# Patient Record
Sex: Male | Born: 1940 | Race: White | Hispanic: No | Smoking: Never smoker
Health system: Southern US, Community
[De-identification: ages and names within clinical notes are randomized; demographics above are authoritative.]

## PROBLEM LIST (undated history)

## (undated) DIAGNOSIS — H409 Unspecified glaucoma: Secondary | ICD-10-CM

## (undated) DIAGNOSIS — E785 Hyperlipidemia, unspecified: Secondary | ICD-10-CM

## (undated) DIAGNOSIS — E119 Type 2 diabetes mellitus without complications: Secondary | ICD-10-CM

## (undated) DIAGNOSIS — G629 Polyneuropathy, unspecified: Secondary | ICD-10-CM

## (undated) DIAGNOSIS — I2510 Atherosclerotic heart disease of native coronary artery without angina pectoris: Secondary | ICD-10-CM

## (undated) DIAGNOSIS — I10 Essential (primary) hypertension: Secondary | ICD-10-CM

## (undated) DIAGNOSIS — M1990 Unspecified osteoarthritis, unspecified site: Secondary | ICD-10-CM

## (undated) DIAGNOSIS — N182 Chronic kidney disease, stage 2 (mild): Secondary | ICD-10-CM

## (undated) DIAGNOSIS — K219 Gastro-esophageal reflux disease without esophagitis: Secondary | ICD-10-CM

## (undated) DIAGNOSIS — M549 Dorsalgia, unspecified: Secondary | ICD-10-CM

## (undated) DIAGNOSIS — K635 Polyp of colon: Secondary | ICD-10-CM

## (undated) HISTORY — DX: Unspecified glaucoma: H40.9

## (undated) HISTORY — DX: Type 2 diabetes mellitus without complications: E11.9

## (undated) HISTORY — PX: KNEE SURGERY: SHX244

## (undated) HISTORY — DX: Atherosclerotic heart disease of native coronary artery without angina pectoris: I25.10

## (undated) HISTORY — DX: Polyneuropathy, unspecified: G62.9

## (undated) HISTORY — DX: Polyp of colon: K63.5

## (undated) HISTORY — DX: Dorsalgia, unspecified: M54.9

## (undated) HISTORY — DX: Unspecified osteoarthritis, unspecified site: M19.90

## (undated) HISTORY — DX: Essential (primary) hypertension: I10

## (undated) HISTORY — DX: Chronic kidney disease, stage 2 (mild): N18.2

## (undated) HISTORY — PX: BACK SURGERY: SHX140

## (undated) HISTORY — DX: Hyperlipidemia, unspecified: E78.5

## (undated) HISTORY — DX: Gastro-esophageal reflux disease without esophagitis: K21.9

---

## 2000-07-22 ENCOUNTER — Encounter: Admission: RE | Admit: 2000-07-22 | Discharge: 2000-10-20 | Payer: Self-pay | Admitting: Anesthesiology

## 2002-07-28 HISTORY — PX: CORONARY ARTERY BYPASS GRAFT: SHX141

## 2002-11-03 ENCOUNTER — Encounter: Payer: Self-pay | Admitting: Emergency Medicine

## 2002-11-04 ENCOUNTER — Inpatient Hospital Stay (HOSPITAL_COMMUNITY): Admission: AD | Admit: 2002-11-04 | Discharge: 2002-11-14 | Payer: Self-pay | Admitting: Emergency Medicine

## 2002-11-07 ENCOUNTER — Encounter: Payer: Self-pay | Admitting: Cardiovascular Disease

## 2002-11-08 ENCOUNTER — Encounter: Payer: Self-pay | Admitting: Surgery

## 2002-11-09 ENCOUNTER — Encounter: Payer: Self-pay | Admitting: Surgery

## 2002-11-10 ENCOUNTER — Encounter: Payer: Self-pay | Admitting: Surgery

## 2002-11-11 ENCOUNTER — Encounter: Payer: Self-pay | Admitting: Surgery

## 2002-12-20 ENCOUNTER — Encounter: Admission: RE | Admit: 2002-12-20 | Discharge: 2002-12-20 | Payer: Self-pay | Admitting: Surgery

## 2002-12-20 ENCOUNTER — Encounter: Payer: Self-pay | Admitting: Surgery

## 2003-11-13 ENCOUNTER — Encounter: Payer: Self-pay | Admitting: Cardiovascular Disease

## 2004-08-22 ENCOUNTER — Ambulatory Visit: Payer: Self-pay | Admitting: Internal Medicine

## 2006-05-25 ENCOUNTER — Encounter: Admission: RE | Admit: 2006-05-25 | Discharge: 2006-05-25 | Payer: Self-pay | Admitting: *Deleted

## 2006-05-27 ENCOUNTER — Encounter: Payer: Self-pay | Admitting: Cardiovascular Disease

## 2006-05-27 ENCOUNTER — Inpatient Hospital Stay (HOSPITAL_COMMUNITY): Admission: EM | Admit: 2006-05-27 | Discharge: 2006-05-28 | Payer: Self-pay | Admitting: Emergency Medicine

## 2008-01-18 ENCOUNTER — Encounter: Payer: Self-pay | Admitting: Cardiovascular Disease

## 2008-02-28 ENCOUNTER — Encounter: Payer: Self-pay | Admitting: Cardiovascular Disease

## 2008-02-28 LAB — CONVERTED CEMR LAB
AST: 9 units/L
Albumin: 4.2 g/dL
HDL: 35 mg/dL
Hgb A1c MFr Bld: 7.3 %
Total Protein: 6.4 g/dL
Triglyceride fasting, serum: 136 mg/dL

## 2008-08-01 ENCOUNTER — Encounter: Payer: Self-pay | Admitting: Cardiovascular Disease

## 2009-02-13 ENCOUNTER — Encounter: Payer: Self-pay | Admitting: Cardiovascular Disease

## 2009-02-21 ENCOUNTER — Encounter: Payer: Self-pay | Admitting: Cardiovascular Disease

## 2009-11-07 ENCOUNTER — Encounter: Payer: Self-pay | Admitting: Cardiovascular Disease

## 2009-11-07 DIAGNOSIS — E1169 Type 2 diabetes mellitus with other specified complication: Secondary | ICD-10-CM | POA: Insufficient documentation

## 2009-11-07 DIAGNOSIS — E782 Mixed hyperlipidemia: Secondary | ICD-10-CM

## 2009-11-07 DIAGNOSIS — E1140 Type 2 diabetes mellitus with diabetic neuropathy, unspecified: Secondary | ICD-10-CM | POA: Insufficient documentation

## 2009-11-07 DIAGNOSIS — M549 Dorsalgia, unspecified: Secondary | ICD-10-CM | POA: Insufficient documentation

## 2009-11-07 DIAGNOSIS — E118 Type 2 diabetes mellitus with unspecified complications: Secondary | ICD-10-CM

## 2009-11-07 DIAGNOSIS — E1159 Type 2 diabetes mellitus with other circulatory complications: Secondary | ICD-10-CM | POA: Insufficient documentation

## 2009-11-07 DIAGNOSIS — K219 Gastro-esophageal reflux disease without esophagitis: Secondary | ICD-10-CM

## 2009-11-07 DIAGNOSIS — I25810 Atherosclerosis of coronary artery bypass graft(s) without angina pectoris: Secondary | ICD-10-CM

## 2009-11-07 DIAGNOSIS — I10 Essential (primary) hypertension: Secondary | ICD-10-CM | POA: Insufficient documentation

## 2009-11-07 DIAGNOSIS — M109 Gout, unspecified: Secondary | ICD-10-CM

## 2009-11-07 DIAGNOSIS — I152 Hypertension secondary to endocrine disorders: Secondary | ICD-10-CM | POA: Insufficient documentation

## 2009-11-07 DIAGNOSIS — I200 Unstable angina: Secondary | ICD-10-CM | POA: Insufficient documentation

## 2010-02-25 ENCOUNTER — Encounter: Payer: Self-pay | Admitting: Cardiovascular Disease

## 2010-03-19 ENCOUNTER — Ambulatory Visit: Payer: BC Managed Care – PPO | Admitting: Emergency Medicine

## 2010-05-24 ENCOUNTER — Ambulatory Visit: Payer: Self-pay | Admitting: Cardiovascular Disease

## 2010-08-29 NOTE — Cardiovascular Report (Signed)
Summary: [facility]  [facility]   Imported By: Marylou Mccoy 07/05/2010 12:04:44  _____________________________________________________________________  External Attachment:    Type:   Image     Comment:   External Document

## 2010-08-29 NOTE — Assessment & Plan Note (Signed)
Summary: NP6/AMD   Visit Type:  Initial Consult Primary Provider:  Dr. Lacie Scotts  CC:  Has a pain that comes across shoulder to his chest..  History of Present Illness: [name] is a very pleasant 70 year old gentleman with coronary artery disease, bypass surgery in 2004, occluded graft to the diagonal branch with 3 other patent grafts to the RCA, OM with a LIMA to the LAD who presents for routine followup. He was last seen at [facility] by myself in July 2010.  He reports that he has been doing very well with no significant chest pain with exertion. He does have occasional left shoulder pain at rest that starts in the shoulder and radiates down into the pectoral region. He is otherwise very active and just got back from the beach and has no complaints. He denies any lightheadedness or dizziness. He does have significant back problems and has had hydrocortisone shots. This dates back to a previous work injury.  We do not have his most recent blood work though cholesterol in 2009 was at goal with an LDL of 53 total cholesterol 115, HDL of 35. Hemoglobin A1c was 7.3  EKG shows normal sinus rhythm with rate of 56 beats per minute, nonspecific ST-T wave changes in the lateral precordial leads, significant artifact was noted  Allergies (verified): 1)  ! * Altace  Past History:  Past Medical History: Last updated: 11/07/2009 Unstable angina, resolved.      a.     Negative myocardial infarction. Positive nuclear study prior to admission. Coronary disease with a history of bypass grafting.      a.     New closure of saphenous vein graft to the diagonal, 100% of the       ostium; attempted angioplasty was not successful. Diabetes mellitus type 2, stable. History of back pain. Hyperlipidemia. Hypertension. Gastroesophageal reflux disease. Gout.  Past Surgical History: Last updated: 11/07/2009 CABG 2004  Review of Systems  The patient denies fever, weight  loss, weight gain, vision loss, decreased hearing, hoarseness, chest pain, syncope, dyspnea on exertion, peripheral edema, prolonged cough, abdominal pain, incontinence, muscle weakness, depression, and enlarged lymph nodes.         left shoulder pain  Vital Signs:  Patient profile:   70 year old male Height:      70 inches Weight:      190 pounds BMI:     27.36 Pulse rate:   57 / minute BP sitting:   154 / 74  (left arm) Cuff size:   regular  Vitals Entered By: Bishop Dublin, CMA (May 24, 2010 10:12 AM)  Physical Exam  General:  Well developed, well nourished, in no acute distress. Head:  normocephalic and atraumatic Neck:  Neck supple, no JVD. No masses, thyromegaly or abnormal cervical nodes. Lungs:  Clear bilaterally to auscultation and percussion. Heart:  Non-displaced PMI, chest non-tender; regular rate and rhythm, S1, S2 without murmurs, rubs or gallops. Carotid upstroke normal, no bruit. Normal abdominal aortic size, no bruits. Pedals normal pulses. No edema, no varicosities. Abdomen:  Bowel sounds positive; abdomen soft and non-tender without masses Msk:  Back normal, normal gait. Muscle strength and tone normal. Pulses:  pulses normal in all 4 extremities Extremities:  No clubbing or cyanosis. Neurologic:  Alert and oriented x 3. Skin:  Intact without lesions or rashes. Psych:  Normal affect.   Impression & Recommendations:  Problem # 1:  CORONARY ATHEROSLERO AUTOL VEIN BYPASS GRAFT (ICD-414.02) history of severe coronary  artery disease. No symptoms at this time. Negative stress test last year . No further testing at this time We'll continue to agree him aggressively.  His updated medication list for this problem includes:    Aspirin 81 Mg Tbec (Aspirin) .Marland Kitchen... Take one tablet by mouth daily    Plavix 75 Mg Tabs (Clopidogrel bisulfate) .Marland Kitchen... 1 tab daily    Coreg 6.25 Mg Tabs (Carvedilol) .Marland Kitchen... 1 tab two times a day    Isosorbide Mononitrate Cr 30 Mg Xr24h-tab  (Isosorbide mononitrate) .Marland Kitchen... 1 tab daily    Ranexa 500 Mg Xr12h-tab (Ranolazine) .Marland Kitchen... 1 tab daily  Problem # 2:  HYPERTENSION (ICD-401.9) Blood pressure on recheck was 120/60. No changes made.  His updated medication list for this problem includes:    Aspirin 81 Mg Tbec (Aspirin) .Marland Kitchen... Take one tablet by mouth daily    Caduet 5-10 Mg Tabs (Amlodipine-atorvastatin) .Marland Kitchen... 1 tab daily    Coreg 6.25 Mg Tabs (Carvedilol) .Marland Kitchen... 1 tab two times a day  Problem # 3:  HYPERLIPIDEMIA (ICD-272.4) We'll try obtain his most recent lipid panel. Previous lipid panel from 2 years ago was at goal.  His updated medication list for this problem includes:    Caduet 5-10 Mg Tabs (Amlodipine-atorvastatin) .Marland Kitchen... 1 tab daily  Problem # 4:  DIABETES MELLITUS, TYPE II (ICD-250.00) We have encouraged him to continue to watch his diet. We will try to obtain the most recent updated medication list for our records.  His updated medication list for this problem includes:    Metformin Hcl 500 Mg Tabs (Metformin hcl) .Marland Kitchen... 1 tab two times a day    Aspirin 81 Mg Tbec (Aspirin) .Marland Kitchen... Take one tablet by mouth daily    Actos 30 Mg Tabs (Pioglitazone hcl) .Marland Kitchen... 1 tab daily  Patient Instructions: 1)  Your physician recommends that you schedule a follow-up appointment in: 6 months 2)  Your physician recommends that you continue on your current medications as directed. Please refer to the Current Medication list given to you today. Please our office with your current medication list.

## 2010-08-29 NOTE — Letter (Signed)
Summary: Office Visit Notes  Office Visit Notes   Imported By: Roderic Ovens 07/09/2010 11:17:52  _____________________________________________________________________  External Attachment:    Type:   Image     Comment:   External Document

## 2010-08-29 NOTE — Letter (Signed)
Summary: Arterial Eval  Arterial Eval   Imported By: Marylou Mccoy 07/05/2010 12:05:24  _____________________________________________________________________  External Attachment:    Type:   Image     Comment:   External Document

## 2010-08-29 NOTE — Consult Note (Signed)
Summary: [facility]  [facility]   Imported By: Marylou Mccoy 07/05/2010 12:06:32  _____________________________________________________________________  External Attachment:    Type:   Image     Comment:   External Document

## 2010-08-30 NOTE — Letter (Signed)
Summary: [facility]  [facility]   Imported By: Marylou Mccoy 07/05/2010 11:59:18  _____________________________________________________________________  External Attachment:    Type:   Image     Comment:   External Document

## 2010-08-30 NOTE — Consult Note (Signed)
Summary: [facility]  [facility]   Imported By: Marylou Mccoy 07/05/2010 12:07:38  _____________________________________________________________________  External Attachment:    Type:   Image     Comment:   External Document

## 2010-12-13 NOTE — Discharge Summary (Signed)
[name], [name]              ACCOUNT NO.:  [id number]   MEDICAL RECORD NO.:  [id number]          PATIENT TYPE:  INP   LOCATION:  6533                         FACILITY:  [facility]   PHYSICIAN:  Darcella Gasman. Ingold, N.P.  DATE OF BIRTH:  11/19/1939   DATE OF ADMISSION:  05/27/2006  DATE OF DISCHARGE:  05/28/2006                                 DISCHARGE SUMMARY   DISCHARGE DIAGNOSES:  1. Unstable angina, resolved.      a.     Negative myocardial infarction.  2. Positive nuclear study prior to admission.  3. Coronary disease with a history of bypass grafting.      a.     New closure of saphenous vein graft to the diagonal, 100% of the       ostium; attempted angioplasty was not successful.  4. Diabetes mellitus type 2, stable.  5. History of back pain.  6. Abnormal EKG results.  7. Hyperlipidemia.   DISCHARGE CONDITION:  Fair.   PROCEDURES:  May 27, 2006:  Combined left heart cath with graft  visualization by Dr. Julieanne Manson.  May 27, 2006:  Attempted PTCA of the saphenous vein graft to the diag by  Dr. Clarene Duke.   DISCHARGE MEDICATIONS:  1. Aspirin 325 mg daily.  2. Caduet 5/10 mg one daily.  3. Imdur 30 mg daily.  4. Coreg 2 of the 3.125 mg twice a day until the bottle is empty, and then      begin a new prescription of the 6.25 mg twice a day.  5. Ranexa 500 mg one twice a day to prevent chest pain.  6. Actos 30 mg daily.  7. Lanoxin 0.25 half a tab daily.  8. Allopurinol 300 mg daily.  9. Janumet 50/1000 twice a day.  Do not take until Sunday.  May interfere      with the dye received during the procedure.  10.Plavix 75 mg one daily.  11.Nitroglycerin sublingual p.r.n. chest pain.  12.Glipizide XL 5 mg daily.   DISCHARGE INSTRUCTIONS:  1. Low-fat, low-salt, diabetic diet.  2. Increase activity slowly.  3. No driving for two days.  No lifting for two days.  4. __________  bleeding, swelling, or drainage.  5. Follow up with Dr. Jenne Campus in [place], June 05, 2006, at 4:15      p.m.  The office will call you if  he wants to schedule that for the      [place] office.  6. Bring all medicines with him to his appointment.   HISTORY OF PRESENT ILLNESS:  A 70 year old white married male with history  of bypass grafting November 09, 2002, with limited LAD and saphenous vein graft  to the diag branch of the LAD and sequential saphenous vein graft to the  first and second OM __________ .  He had done well until recently and he  underwent for a CT Myoview for left arm and chest discomfort that would  occur at random, not precipitated by exertion.  The Myoview was positive for  ischemia inferior and left inferior and anterior leads.  He saw Dr. Jenne Campus  in the office recently.  Coreg and Imdur were added to his medical regimen  and plans for cardiac cath.  On May 27, 2006, patient woke up early in  the morning developed left anterior chest pain, exertional pressure  associated with left shoulder pain with radiation across his right shoulder  as well.  He was short of breath but no nausea, vomiting, or diaphoresis.  He took two nitro without helping and came to the emergency room.  Here it  continues with left anterior chest pressure it does improve with a deep  breath.  Feels better now than he did at home.  Also complained of  some  left facial numbness.   PAST MEDICAL HISTORY:  As stated, he did have some PAF prior to his bypass,  but none since.  He also has a history of hypertension, gastroesophageal  reflux disease, diabetes mellitus type 2, gout, hyperlipidemia, and back  pain.   ALLERGIES:  ALTACE made him sick, but he is able to take lisinopril without  problems.   OUTPATIENT MEDS:  Lisinopril 10 mg, Caduet 10/10 daily, allopurinol 300 mg,  Digitek 0.125 mg, Actos 3 mg, Janumet 50/1000 b.i.d., Coreg 3.125 b.i.d.,  Imdur 30 mg p.r.n., Glipizide XL 5 daily.   FAMILY HISTORY/SOCIAL HISTORY/REVIEW OF SYSTEMS:  See HPI.   PHYSICAL EXAM  AT DISCHARGE:  VITAL SIGNS:  Blood pressure 118/54.  Pulse 61.  Respirations 17.  Temp 97.7.  Oxygen saturation 93%.  HEART:  S1, S2, regular rate and rhythm.  Did have a palpable systolic  murmur.  LUNGS:  Clear.  ABDOMEN:  Soft, nontender.  Positive bowel sounds.  Right groin stable, without hematoma.   LABORATORY DATA:  Admitting hemoglobin 12.0, hematocrit 36.4, platelets 233,  WBC 7.2 at discharge.  Platelets 213 and WBC 7.3.  Chemistries prior to  discharge sodium 141, potassium 4.2, BUN 8, creatinine 1.1, glucose 103.  Cardiac enzymes were negative.  CK 73, CK-MB 42.  Troponin I was less than  0.01.  Glycohemoglobin was 7.2.   Chest x-ray was without acute disease, and EKG was initially sinus rhythm  with ST depression in V4 through V5 and leads II and III; this improved post  procedure.   HOSPITAL COURSE:  Patient was admitted by Dr. Jacinto Halim, on call for Dr.  Jenne Campus, on May 27, 2006, for unstable angina.  He underwent cardiac  cath, with results as previously stated.  He was stable, and by May 28, 2006, ambulated without problems.  After 12 hours of Integrilin, was ready  to discharge.  He would follow up as an  outpatient with Dr. Jenne Campus.  Dr. Jacinto Halim also recommended probable small-  vessel disease may be causing his angina and that he would be a candidate  for EECP if he the chest pain were to recur.  He will go to see Dr. Jenne Campus  next week for EKG followup after beginning Ranexa.      Darcella Gasman. Annie Paras, N.P.     LRI/MEDQ  D:  05/28/2006  T:  05/29/2006  Job:  [id number]

## 2010-12-13 NOTE — Cardiovascular Report (Signed)
[name], [name]              ACCOUNT NO.:  [id number]   MEDICAL RECORD NO.:  [id number]          PATIENT TYPE:  INP   LOCATION:  2807                         FACILITY:  [facility]   PHYSICIAN:  Thereasa Solo. Little, M.D. DATE OF BIRTH:  11/19/1939   DATE OF PROCEDURE:  DATE OF DISCHARGE:                              CARDIAC CATHETERIZATION   INDICATIONS:  This 70 year old male had bypass surgery in 2004.  He had a  nuclear study performed that was positive for ischemia and was scheduled for  a cardiac catheterization 5 days from now.  He presented to the emergency  room with worsening chest pain, had negative CK and troponins, but had ST-  segment depression in lateral leads that was worrisome for ischemia, and  because of this is brought to the cath lab.  He is already on IV heparin and  IV nitroglycerin.   PROCEDURE:  After obtaining informed consent the patient was prepped and  draped in the usual sterile fashion exposing the right groin.  .  Local  anesthetic with 1% Xylocaine with Seldinger technique employed and a 6-  [place] introducer sheath was placed in the right femoral artery.  Left and  right coronary arteriography, ventriculography, graft visualization and  distal aortogram and percutaneous intervention was performed.   COMPLICATIONS:  None.   TOTAL CONTRAST USED:  225 mL.   EQUIPMENT:  A 6-French sheath, 6-French left coronary catheter, 5-French  right coronary catheter, and the Allografts were visualized with a JR-4  catheter.   RESULTS:  1. Hemodynamic monitoring:  Central aortic pressure 118/62, left      ventricular pressure 119/4,  there was no aortic valve gradient of      pullback.  2. Ventriculography.  Ventriculography in the RAO projection revealed      normal LV systolic function.  Ejection fraction of approximately 50-      55%.  End-diastolic pressure was 15.  No mitral regurgitation was seen.      The patient had a run of ventricular tachycardia  during the      ventriculogram, but on two post VT beats, there appeared to be normal      systolic function.  3. Distal aortogram.  Distal aortogram done laterally at the renal artery      showed no renal artery stenosis.  No abdominal aortic aneurysm.  4. Coronary arteriography.  The patient had very small native coronary      vessels, about one and a half to 2 mm at the most.      a.     Left main.  There was 20-30% terminal narrowing of the left       main.      b.     Circumflex.  The AV groove circumflex was seen with a proximal       95% area of narrowing.  This was a small vessel about 1 to 1-1/2 mm in       diameter.  The OM's were not visualized, but had been grafted, and       there was some visualization when you  inject the left circulation of       the sequential saphenous vein graft to the OM's.      c.     LAD.  The LAD actually extended to the apex of the heart and in       its largest diameter was about 2-1/2 mm.  There was a focal area in       the proximal portion of the LAD of about 80% and this was at the       takeoff of a very small first diagonal that was less than 1 mm in       diameter.  There was some filling of the internal mammary artery graft       to the native circulation.      d.     Right coronary artery.  This is a non-grafted vessel with mild       irregularities in the proximal mid portion of less than 40%.  Two PL's       and a posterior descending artery were noted.      e.     Grafts.  The saphenous vein graft to the diagonal was 100%       occluded in its ostium.      f.     Saphenous vein graft sequentially at OM1 and OM2.  The graft was       widely patent.  OM1 and OM2 were visualized, and both the OM's       bifurcated  There was some retrograde filling of the AV groove circ       via the OM2.  All of these vessels were about 1 to 1-1/2 mm in       diameter with moderate irregularities.      g.     Internal mammary artery to the LAD.  The  internal mammary artery       actually came off almost in the anterior axillary line.  The internal       mammary artery was widely patent.  It inserted in the mid portion of       the LAD and the LAD filled distally.  It was small with no high-grade       stenoses.  There was mild bidirectional flow in the proximal portion       of the LAD, but the diagonal was not visualized retrograde.   Because of the abnormal nuclear study and the loss of the vein graft to what  appeared to be an optional diagonal which was 1-1/2 mm vessel with a 90%  proximal narrowing, decision was made to try to proceed on with intervention  to this.   Because the patient is already on interrogating only the slight amount of  additional heparin to become therapeutic.  A JL-4 6-French guide catheter  short Luge wire was used.  When the Luge wire was passed down the optional  diagonal, the vessel could no longer be visualized.  In reality, the  diameter of the vessel through the area of stenosis was as small as the Luge  wire was.  Attempts at passing a 1.5 x 12 Voyager balloon were unsuccessful.  I could not get the balloon to pass into the ostium of the vessel.  After 15  minutes of attempting to cross this, we finally gave up on this vessel.   We then addressed the AV groove circumflex.  The same equipment was used for  this.  The wire was easily passed down the AV groove circumflex, but the  balloon again would not cross the high-grade stenosis in this very small  vessel.  Finally we did a single inflation trying to wedge at the proximal  portion of the obstruction open so we could advance a balloon with this  inflation, there was no substantial change.  We finally gave up on this  lesion also.   The patient will be managed medically because of his chest pain and the  attempted interventions.  I have opted to keep him on Integralin for 12 hours, continue on IV nitroglycerin.  Will start Ranexa in the morning  and  manage him medically.  He should be ready for discharge in about 36 hours  provided he remained stable.           ______________________________  Thereasa Solo. Little, M.D.     ABL/MEDQ  D:  05/27/2006  T:  05/28/2006  Job:  [id number]   cc:   Catheterization Laboratory  Darlin Priestly, MD  [place] [facility]
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
## 2010-12-13 NOTE — Op Note (Signed)
[name], [name]                        ACCOUNT NO.:  [id number]   MEDICAL RECORD NO.:  [id number]                   PATIENT TYPE:  INP   LOCATION:  2309                                 FACILITY:  [facility]   PHYSICIAN:  Evelene Croon, M.D.                  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   PROCEDURES:  1. Median sternotomy.  2. Extracorporeal circulation.  3. Coronary artery bypass graft surgery x4 using a left internal mammary     artery graft to the left anterior descending coronary artery, with a     saphenous vein graft to the diagonal branch of the left anterior     descending artery, and a sequential saphenous vein graft to the first and     second obtuse marginal branches of the left circumflex coronary artery.  4. Endoscopic vein harvesting from the left leg.   SURGEON:  Evelene Croon, M.D.   ASSISTANT:  Toribio Harbour, R.N.   ANESTHESIA:  General endotracheal.   CLINICAL HISTORY:  This patient is a 70 year old gentleman with a two-year  history of substernal chest burning, followed by his primary medical  physician and treated as reflux.  His symptoms have been worsening.  He was  seen at [facility] recently and had a stress test that was  positive for ischemia.  He underwent cardiac catheterization on 11/01/02 by  Dr. Juliann Pares with attempted angioplasty of a tight left midcircumflex  stenosis.  This was unsuccessful.  The patient was treated medically but  developed recurrent severe chest pain and was readmitted to [facility].  He  underwent a cardiac catheterization, which showed severe three-vessel  coronary artery disease.  The LAD has 70% proximal stenosis.   There was a  diagonal branch that had 90% proximal stenosis.  There was a high marginal  branch that had about an 80% proximal stenosis.  The left circumflex was  subtotally occluded before a large marginal branch and then a smaller third  marginal branch.  The right coronary artery had proximal and midvessel  irregularities with less than 40% stenosis.  It was supplying collaterals to  the subtotally-occluded distal left circumflex.  Left ventricular ejection  fraction was about 40-45% with anterior apical hypokinesis.  There was no  gradient across the aortic valve and no mitral regurgitation.  After review  of the angiogram and examination of the patient, it was felt that coronary  artery bypass graft surgery was the best treatment.  I discussed the  operative procedure with the patient and his family including alternatives,  benefits, and risks, including bleeding, blood transfusion, infection,  stroke, myocardial infarction,  and death.  They understood and agreed to  proceed.   DESCRIPTION OF PROCEDURE:  The patient was taken to the operating room and  placed on the table in supine position.  After induction of general  endotracheal anesthesia, a Foley catheter was placed in the bladder using  sterile technique.  Then the chest, abdomen, and both lower extremities were  prepped and draped in the usual sterile manner.  The chest was entered  through a median sternotomy incision and the pericardium opened in the  midline.  Examination of the heart showed good ventricular contractility.  The ascending aorta had no palpable plaques in it.   Then the left internal mammary artery was harvested from the chest wall as a  pedicle graft.  This was a medium-caliber vessel with excellent blood flow  through it.  At the same time a segment of greater saphenous vein was  harvested from the left leg using endoscopic vein harvest technique.  This  vein was of medium caliber and good quality.    Then the patient was heparinized and when an adequate activated clotting  time was achieved, the distal ascending aorta was cannulated using a 20  French aortic cannula for arterial inflow.  Venous outflow was achieved  using a two-stage venous cannula through the right atrial appendage.  An  antegrade cardioplegia and vent cannula was inserted in the aortic root.   The patient was placed on cardiopulmonary bypass and the distal coronary  arteries identified.  The LAD was a large, graftable vessel.  The diagonal  branch was a medium-sized, graftable vessel.  The first marginal was small  but graftable.  The distal left circumflex gave off a medium-sized second  marginal and a small third marginal.  The right coronary artery was  diffusely diseased with plaque.  There were posterior descending and  posterolateral branches, which had no disease in them.  I did not feel that  there was any significant stenosis in the right coronary artery system, and  therefore this was not grafted.   Then the aorta was crossclamped and 500 mL of cold blood antegrade  cardioplegia was administered in the aortic root with quick arrest of the  heart.  Systemic hypothermia to 20 degrees Centigrade and topical  hypothermia with iced saline was used.  A temperature probe was placed in  the septum and the insulating pad in the pericardium.   The first distal anastomosis was then performed to the first marginal  branch.  The internal diameter of this vessel was about 1.5 mm.  The conduit  used was a segment of greater saphenous vein and the anastomosis performed  in a sequential side-to-side manner using continuous 7-0 Prolene suture.  Flow was measured through the graft and was excellent.   Then the second distal anastomosis was performed to the second marginal  branch.  The internal diameter of this vessel was about 1.6 mm.  The conduit used was the same segment of greater saphenous vein and the anastomosis   performed in a sequential end-to-side manner using continuous 7-0 Prolene  suture.  Flow was measured the grafts and was excellent.   The third distal anastomosis was performed to the diagonal branch.  The  internal diameter of this vessel was about 1.6 mm.  The conduit used was a  second segment of greater saphenous vein and the anastomosis performed in an  end-to-side manner using continuous 7-0 Prolene suture.  Flow was measured  through the graft and  was excellent.  Then another dose of cardioplegia was  given down the vein graft and in the aortic root.   The fourth distal anastomosis was performed to the midportion of the left  anterior descending coronary artery.  The internal diameter was 1.75 mm.  The conduit used was the left internal mammary graft, and this was brought  through an opening in the left pericardium anterior to the phrenic nerve.  It was anastomosed to the LAD in an end-to-side manner using continuous 8-0  Prolene suture.  The pedicle was tacked to the epicardium with 6-0 Prolene  sutures.  The patient then rewarmed to 37 degrees Centigrade and the clamp  removed from the mammary pedicle.  There was rapid warming of the  ventricular septum and the return of spontaneous ventricular fibrillation.  The crossclamp was removed with a time of 54 minutes, and the patient was  defibrillated into sinus rhythm.   A partial occlusion clamp was placed on the aortic root and the two proximal  vein graft anastomoses were then performed in an end-to-side manner using  continuous 6-0 Prolene suture.  The clamp was removed, the vein grafts de-  aired, and the clamps removed from them.  The proximal and distal  anastomoses appeared hemostatic, the alignment of the grafts satisfactory.  Graft markers were placed on the proximal anastomoses.  Two temporary right  ventricular and right atrial pacing wires were placed and brought out  through the skin.   When the patient had rewarmed  to 37 degrees Centigrade, he was weaned from  cardiopulmonary bypass on no inotropic agents.  Total bypass time was 86  minutes.  Cardiac function appeared excellent with a cardiac output of 5  L/min.  Protamine was given and the venous and aortic cannulas were removed  without difficulty.  Hemostasis was achieved.  Three chest tubes were placed  with a tube in the posterior pericardium, one in the left pleural space, and  one in the anterior mediastinum.  The pericardium was reapproximated over  the heart.  The sternum was closed with #6 stainless steel wires.  The  fascia was closed with continuous #1 Vicryl suture.  The subcutaneous tissue  was closed with continuous 2-0 Vicryl and the skin with 3-0 Vicryl  subcuticular closure.  The lower extremity vein harvest site was closed in  layers in a similar manner.  The sponge, needle, and instrument counts were correct according to the scrub nurse.  Dry sterile dressings were applied  over the incisions and around the chest tubes, which were hooked to  Pleuravac suction.  The patient remained hemodynamically stable and was  transported to the SICU in guarded but stable condition.                                               Evelene Croon, M.D.    BB/MEDQ  D:  11/09/2002  T:  11/09/2002  Job:  [id number]   cc:   Darlin Priestly, M.D.  [address], [address]  [place]  [place] [address]  Fax: [phone]   [facility]

## 2010-12-13 NOTE — Procedures (Signed)
[facility]  Patient:    [name], [name]                     MRN: [id number] Proc. Date: 08/14/00 Adm. Date:  [id number] Attending:  Thyra Breed CC:         [name]. Jerl Santos, M.D.  Dr. Lacie Scotts   Procedure Report  PROCEDURE:  Lumbar epidural steroid injection.  DIAGNOSIS:  Lumbar degenerative disk disease with underlying lumbar spondylosis.  ANESTHESIOLOGIST:  Thyra Breed, M.D.  INTERVAL HISTORY:  The patient noted market improvement in his pain after two injections.  He rates the pain at 2/10.  He states he is tolerating them well, and he does not feel as though his sugar is out of control.  PHYSICAL EXAMINATION:  VITAL SIGNS:  Blood pressure 120/62, heart rate 83 respiratory rate 18, O2 saturation 98%, pain level 2/10.  BACK:  Shows good healing from previous injection site.  PROCEDURE:  After informed consent was obtained, the patient was placed in the sitting position and monitored.  His back was prepped with Betadine x 3.  A skin wheal was raised at the L4-5 inner space with 1% lidocaine.  A 20-gauge Tuohy needle was introduced in the lumbar epidural space to loss of resistance with preservative free normal saline.  There was no CSF nor blood.  Medrol 40 mg and 8 ml preservative free normal saline was gently injected.  The needle was flushed with preservative free normal saline and removed intact.  POSTPROCEDURE CONDITION:  Stable.  DISCHARGE INSTRUCTIONS: 1. Resume previous diet. 2. Limitation of activities per instruction sheet. 3. Continue on current medications and follow blood sugars closely. 4. The patient was encouraged to follow up with Dr. Jerl Santos.  He is aware    that we cannot do a repeat series of epidural steroid injections for    at least six months.DD:  08/14/00 TD:  08/15/00 Job: [id number]

## 2010-12-13 NOTE — Consult Note (Signed)
NAMEKENNY, REA                        ACCOUNT NO.:  [id number]   MEDICAL RECORD NO.:  [id number]                   PATIENT TYPE:  INP   LOCATION:  2012                                 FACILITY:  [facility]   PHYSICIAN:  Evelene Croon, M.D.                  DATE OF BIRTH:  11/19/1939   DATE OF CONSULTATION:  11/04/2002  DATE OF DISCHARGE:                                   CONSULTATION   CARDIOVASCULAR THORACIC SURGERY CONSULTATION:   REASON FOR CONSULTATION:  Severe three vessel coronary artery disease with  unstable angina.   HISTORY OF PRESENT ILLNESS:  The patient is a 70 year old gentleman who has  a 2-year history of substernal chest burning who has been followed by his  primary medical doctor and treated as gastroesophageal reflux.  The patient  said his symptoms failed to resolve with treatment for reflux and have  actually worsened over time.  These episodes typically occur with exertion  and have been occurring with less and less exertion.  There is some  associated shortness of breath.  There also has been a decrease in his  exertional tolerance.  He was seen by Dr. Juliann Pares at [facility] for a stress test which was reportedly positive for ischemia.  He  underwent catheterization by Dr. Juliann Pares on 11/01/2002 with an attempted  angioplasty of a tight left midcircumflex lesion.  This was unsuccessful.  The patient was told that he was going to be treated medically.  The patient  developed recurrent severe chest pain last evening and came to the [facility] since this was closer to his home.  His CPK was 61 with  an MB of 2.1 and 44 with an MB of 1.4.  Troponin was 0.08 and 0.07.  Electrocardiogram showed sinus bradycardia with no acute ischemic changes.  The patient's chest pain resolved on intravenous nitroglycerin.  He  underwent cardiac catheterization today which showed severe three vessel  disease.  The LAD had 70% proximal stenosis.   There were two diagonal  branches, the first of which had an 80% proximal stenosis and the second had  90% proximal stenosis.  The LAD also has 90% stenosis distally near the  apex.  The left circumflex was subtotally occluded after two small marginal  branches.  This was compromising a large third marginal and fourth marginal  branches.  The right coronary artery had proximal and midvessel  irregularities with less than 40% stenosis.  It was supplying collaterals to  the subtotally occluded left circumflex.  The left ventricular ejection  fraction was 40-45% with anteroapical hypokinesis.  There was no gradient  across the aortic valve and no mitral regurgitation.   PAST MEDICAL HISTORY:  His past medical history is significant for coronary  artery disease as mentioned above.  He has hypertension.  He has a history  of noninsulin-dependent diabetes but said his  blood sugars are usually under  control.  He has a history of hyperlipidemia.  He has a history of gout.  He  has a history of  gastroesophageal reflux disease symptoms although this is  likely secondary to coronary artery disease.   PAST SURGICAL HISTORY:  His past surgical history is significant for  reconstruction of his right knee with replacement of his kneecap for trauma.   MEDICATIONS:  His medications prior to admission were Toprol XL 25 mg once  daily, Imdur 30 mg once daily, Lipitor 10 mg once daily, allopurinol 300 mg  once daily, Amaryl 4 mg b.i.d.   ALLERGIES:  None.   REVIEW OF SYSTEMS:  GENERAL: He denies any fever or chills.  He has had no  recent weight changes.  EYES: Negative.  ENT: Negative.  ENDOCRINE: He  denies hypothyroidism.  He has noninsulin-dependent diabetes.  CARDIOVASCULAR:  As above.  He denies PND and orthopnea.  He has had no  peripheral edema.  He denies palpitations.  RESPIRATORY: He denies cough and  sputum production.  GI: He has had no nausea or vomiting.  He denies melena  and bright  red blood per rectum.  GU: He denies dysuria and hematuria.  NEUROLOGICAL: He has had no focal weakness or numbness.  He denies dizziness  and syncope.  PSYCHIATRIC: Negative.  MUSCULOSKELETAL: He denies  arthralgias.  He does have two ruptured disks in his lower back which he has  had treated with injections in the past year.   SOCIAL HISTORY:  He is married.  He works on his farm.  He chews tobacco for  the past 35 years and smoked before that.  He denies alcohol abuse.   FAMILY HISTORY:  Negative.   PHYSICAL EXAMINATION:  VITAL SIGNS:  His blood pressure is 95/65 and his  pulse is 70 and regular.  Respiratory rate of 16 and unlabored.  GENERAL:  He is a well-developed white male in no distress.  HEENT:  Normocephalic and atraumatic.  The pupils are equal and reactive to  light and accommodation.  Extraocular muscles are intact.  His throat is  clear.  NECK:  Normal carotid pulses bilaterally.  There are no bruits.  There is no  adenopathy or thyromegaly.  CARDIAC:  Regular rate and rhythm with normal S1 and S2.  There is no  murmur, rub, or gallop.  LUNGS:  Clear.  ABDOMEN:  Active bowel sounds.  His abdomen is soft and nontender.  There  are no palpable masses or organomegaly.  EXTREMITIES:  No peripheral edema.  Pedal pulses are palpable bilaterally.  SKIN:  Warm and dry.  NEUROLOGIC:  Alert and oriented x3.  Motor and sensory exams are grossly  normal.   LABORATORY EXAMINATION:  Normal electrolytes with a BUN of 14 and creatinine  of 1.1.  Glucose was elevated at 193 on admission.  White blood cell count  was 9.8 with a hemoglobin of 14.1, platelet count of 201,000.  Coagulation  profile was normal on admission.  Albumin was 3.5.  Liver function profile  is within normal limits.   IMPRESSION:  The patient has severe three vessel coronary artery disease  with unstable anginal symptoms.  I agree that coronary artery bypass graft surgery is the best treatment to prevent  further ischemia and infarction.  I  discussed the operative procedure with the patient and his son including  alternatives, benefits, and risks including bleeding, blood transfusion,  infection, stroke, myocardial infarction, and death.  They understand and  agree to proceed.   PLAN:  We will plan to proceed with surgery next Wednesday as long as he  remains stable on heparin and nitroglycerin.                                               Evelene Croon, M.D.    BB/MEDQ  D:  11/04/2002  T:  11/05/2002  Job:  [id number]   cc:   Darlin Priestly, M.D.  [address], [address]  [place]  [place] [address]  Fax: [phone]

## 2010-12-13 NOTE — Discharge Summary (Signed)
NAMERUAIRI, STUTSMAN                        ACCOUNT NO.:  [id number]   MEDICAL RECORD NO.:  [id number]                   PATIENT TYPE:  INP   LOCATION:  2002                                 FACILITY:  [facility]   PHYSICIAN:  Evelene Croon, M.D.                  DATE OF BIRTH:  11/19/1939   DATE OF ADMISSION:  11/03/2002  DATE OF DISCHARGE:  11/14/2002                                 DISCHARGE SUMMARY   ADMISSION DIAGNOSIS:  Chest pain with known coronary artery disease.   PAST MEDICAL HISTORY:  1. Coronary artery disease, status post recent catheterization and PCI.  2. Hypertension.  3. Gastroesophageal reflux disease.  4. Diabetes mellitus type 2.  5. Gout.  6. Hyperlipidemia.   ALLERGIES:  No known drug allergies.   BRIEF HISTORY:  The patient is a 70 year old Caucasian man.  He presented to  the [facility] Emergency Department the evening of 4/8 after developing  chest pain.  He has been followed by his primary care Jerrol Helmers for history  of substernal chest burning which was thought to be related to his  gastroesophageal reflux disease.  His symptoms, however, failed to resolve  and actually worsened over time.  he was seen by Dr. Sanda Linger at [facility] for a stress test in early April.  This test was positive  for ischemia.  He then underwent a catheterization and attempted angioplasty  of a tight mid circumflex lesion on 11/01/02.  The PCI was not successful and  the patient was informed he was to be treated medically.  He developed  recurrent severe chest pain the evening of 4/8 and went to the [facility]  Emergency Department since this was closer to his home.  His admission  cardiac enzymes including  CPK of 61, MB of 2.1 and 44 and troponin 0.08 and  0.07.  EKG revealed sinus bradycardia with no acute changes.   HOSPITAL COURSE:  The patient was admitted to [facility] and  cardiology consult was requested.  He was evaluated by [facility] physician (Dr. Domingo Sep) who recommended proceeding with  a repeat cardiac catheterization.  Prior to this catheterization, he was  started on IV heparin and nitroglycerin therapy.  He underwent cardiac  catheterization later in the day on 4/9.  This revealed severe three-vessel  coronary artery disease.  The left ventricular ejection fraction estimated  to be 40 to 45% with anteroapical hypokinesis.  His lesions are not amenable  to further PCI.  Therefore, cardiac surgery consult was requested.  He was  evaluated later in the day by Dr. Evelene Croon.  After examination of the  patient, he reviewed all the records.  Dr. Laneta Simmers recommended proceeding  with coronary artery bypass grafting to prevent further ischemia and  infarction.  Procedure risk and benefits were discussed with the patient and  he agreed to proceed  with surgery.  His surgery is tentatively scheduled for  4/14 as long as he remains stable on heparin and nitroglycerin.   Preoperative Doppler studies were performed on 4/12 which revealed no  significant coronary artery disease.  His ABIs were greater than 1.0  bilaterally.   The patient remained stable in the hospital and underwent the following  surgical procedure on 11/09/02.  Dr. Laneta Simmers performed a coronary artery  bypass grafting x4.  Grafts placed at the time of procedure:  Left  internal  mammary artery graft to the left anterior descending artery, saphenous vein  graft to the diagonal artery, saphenous vein graft in sequential fashion to  the first and second obtuse marginal arteries.  Vein was harvested from the  left thigh via the endovein harvesting technique.  He tolerated this  procedure well and was transferred in stable condition to the SICU.  He  remained hemodynamically stable in the immediate postoperative period and  was extubated later that evening.   POSTOPERATIVE ISSUES:  1. Transient elevation of his creatinine to 1.8 on  postoperative day #1 as     well as hypokalemia.  His creatinine returned to within normal limits at     1.1 as did his potassium at 4.4 later in the day on postoperative day #1.     This stayed within normal range throughout his hospital stay.  2. Diabetes mellitus type 2.  He was treated with Glucomander protocol in     the immediate perioperative period and then started on Lantus     postoperative day #1.  His oral medication was resumed when he was taking     p.o.  The first two days after surgery his CBGs remained in the high 100s     to over 200 range.  By postoperative day #3, his CBGs ranged 135 to 241     and by the morning of postoperative day #4 his CBG was 65.  Lantus was     discontinued on 4/18 and he will go home on his Amaryl.  He was seen in     consultation by diabetes coordinator and arrangements have been  made for     him to follow up with the nutrition diabetes management center after     discharge.  3. PAF.  On 4/16 he had some intermittent atrial fibrillation with rate 135     to 171.  He was started on dig protocol and his Lopressor was increased.     He quickly resumed normal sinus rhythm and has remained in sinus rhythm     since that time.  Heart rate was slightly tachy at 105 on the 18th and     his Lopressor was again increased.   Overall, the patient is making good progress in recovering from his surgery.  On 11/13/02, postoperative day #4, he reports feeling very well.  His vital  signs are stable with blood pressure 130/60.  He is afebrile.  Heart is  maintaining sinus rhythm at approximately  105 beats per minute.  His lungs  are clear.  He is tolerating his diet well with no nausea.  His bowel and  bladder functions are within normal limits for him.  His incisions are all  healing well.  He has minimal lower extremity edema.  He is ambulating  without difficulty.  His pain is well controlled.  If the patient continues his progress it is anticipated he  will be ready for discharge home tomorrow  11/13/02.   CONDITION ON DISCHARGE:  Improved.   INSTRUCTIONS ON DISCHARGE:   MEDICATIONS:  1. Lopressor 50 mg p.o. b.i.d. suggested at 8 a.m. and 8 p.m.  2. Digoxin 0.125 mg p.o. daily.  3. Enteric coated aspirin 325 mg p.o. daily.  4. Colace 200 mg p.o. daily.   He is instructed to resume his following home medications.  1. Lipitor 10 mg p.o. q.h.s.  2. Allopurinol 300 mg p.o. daily.  3. Amaryl 4 mg p.o. daily.  4. He has also been reminded to check his blood sugars daily and record.   For pain management he may have Tylox one to two p.o. q.4-6h for moderate to  severe pain or Tylenol 325 mg one to two p.o.  q.4-6h p.r.n.  for mild pain.   ACTIVITY:  He has been asked to refrain from any driving or any heavy  lifting, pushing or pulling.  He has also been instructed to continue  his  breathing exercises and daily walking.   DIET:  Continue diabetic diet.   WOUND CARE:  He may shower with mild soap and water.  If his incisions are  red, heat, swollen or draining or if he has a fever greater than 101 degrees  Fahrenheit he is to call Dr. Sharee Pimple office.   FOLLOW UP:  1. He will been seen by cardiologist at [facility] approximately in two weeks.  He has been asked to call to arrange     for that appointment and he has been given the phone number.  2. Dr. Laneta Simmers would like to see him in the CVTS office in three weeks. The     office will call him with a date and time for that appointment.  He will     be asked to have a chest x-ray at [facility] one hour before that appointment.  3. Nutrition and diabetes management center will call him at home to arrange     outpatient teaching.       Ranelle Oyster, M.D.                   Evelene Croon, M.D.    ZTS/MEDQ  D:  11/13/2002  T:  11/14/2002  Job:  [id number]   cc:   [facility]   [facility]

## 2010-12-13 NOTE — Op Note (Signed)
[facility] [place]  Patient:    [name], [name]                     MRN: [id number] Proc. Date: 07/31/00 Adm. Date:  [id number] Attending:  Thyra Breed CC:         Dr. Henrene Pastor. Jerl Santos, M.D.   Operative Report  PROCEDURE:  Lumbar epidural steroid injection.  DIAGNOSES: 1. Lumbar degenerative disk disease. 2. Lumbar spondylosis.  INTERVAL HISTORY:  The patient has noted improvement overall.  He did go ahead and see him primary care physician, Dr. Lacie Scotts, who went ahead and started him on an oral hypoglycemic for his elevated blood sugar.  PHYSICAL EXAMINATION:  Blood pressure 124/78, heart rate 80, respiratory rate 18, O2 saturation 96%.  His back shows good healing from his previous injection site.  DESCRIPTION OF PROCEDURE:  After informed consent was obtained, the patient was placed in the sitting position and monitored.  His back was prepped with Betadine x 3.  A skin wheal was raised at the L4-5 interspace with 1% lidocaine.  A 20 gauge Tuohy needle was introduced in the lumbar epidural space with loss of resistance to preservative-free normal saline.  There was no CSF nor blood.  Medrol 40 mg in 7 ml of preservative-free normal saline was gently injected.  The needle was flushed with preservative-free normal saline and removed intact.  POSTPROCEDURE CONDITION:  Stable.  DISCHARGE INSTRUCTIONS: 1. Resume previous diet. 2. Limitation of activities per instruction sheet. 3. Continue on current medications. 4. The patient plans to see me in follow-up in one to two weeks for third    injection. DD:  07/31/00 TD:  07/31/00 Job: [id number]

## 2010-12-13 NOTE — Cardiovascular Report (Signed)
[name], [name]                        ACCOUNT NO.:  [id number]   MEDICAL RECORD NO.:  [id number]                   PATIENT TYPE:  INP   LOCATION:  2012                                 FACILITY:  [facility]   PHYSICIAN:  Darlin Priestly, M.D.             DATE OF BIRTH:  11/19/1939   DATE OF PROCEDURE:  11/04/2002  DATE OF DISCHARGE:                              CARDIAC CATHETERIZATION   PROCEDURES PERFORMED:  1. Left heart catheterization.  2. Coronary angiography.  3. Left ventriculogram.   CARDIOLOGIST:  Darlin Priestly, M.D.   COMPLICATIONS:  None.   INDICATIONS FOR PROCEDURE:  The patient is a 70 year old male from  [place], patient of Dr. Buzzy Han, [facility], with a history  of hypertension and noninsulin-dependent diabetes mellitus who recently  underwent stress test, which was reportedly abnormal in [place].  He  underwent cardiac catheterization on November 01, 2002 by Dr. Tamera Reason  with attempted PCI of the mid circumflex subtotal occlusion.  This was  unsuccessful.  The patient developed recurrent chest pain on November 03, 2002  and subsequently was readmitted to [facility].  He is now for  repeat catheterization to reassess his coronary status.   DESCRIPTION OF PROCEDURE:  After obtaining informed consent the patient was  brought to the cardiac cath lab where the left groin was shaved, prepped and  draped in the usual sterile fashion.  ECG monitoring was established.  Using  the modified Seldinger technique a #6 French arterial sheath was inserted in  the left femoral artery.  A 6 French diagnostic catheter was used to perform  diagnostic angiography.   RESULTS:   ANGIOGRAPHY:  Left Main Coronary Artery:  This reveals a large left main  with no significant disease.   Left Anterior Descending:  The LAD is a large vessel that coursed the apex  and gives rise to three diagonal branches.  The LAD is noted to have diffuse  60-70%  disease in its proximal portion.  The apical LAD has 90% lesion.  The  first and second diagonals course as the obtuse marginal branches.  The  first OM is a large vessel with 80% ostial lesion.  The second OM is a large  vessel with 90% ostial lesion.  The third diagonal is a medium size vessel  with no significant disease.   Left Circumflex Artery:  The left circumflex is a medium size vessel that  courses the AV groove and gives rise to four obtuse marginal branches.  The  AV groove circumflex is subtotally occluded in its mid portion.  There are  faint bridging collaterals into the distal circumflex filling two lower  obtuse marginal branches.  The first and second OMs are small vessels.  The  third OM appears to be a large vessel which bifurcates distally and has 50%  distal stenosis.  The fourth OM appears to be a medium size vessel  and  bifurcates distally, and has no significant disease.   Right Coronary Artery:  The right coronary artery is a large vessel, which  is dominant and gives rise to both the PDA and posterolateral branch.  The  RCA is diffusely irregular, up to 40% stenosis throughout.  PDA and  posterolateral branch have no significant disease, but are irregular.  There  are right-to-left collaterals to the distal AV groove circumflex as well as  the obtuse marginal.   LEFT VENTRICULOGRAM:  Left ventriculogram reveals mildly depressed EF at 40-  45% with anterolateral hypokinesis.   HEMODYNAMICS:  Systemic arterial pressure 110/71.  LV systemic pressure  102/5.  LVEDP 21.   CONCLUSION:  1. Significant three-vessel coronary artery disease.  2. Mildly depressed left ventricular systolic function.  3. Elevated left ventricular end diastolic pressure.                                                 Darlin Priestly, M.D.    RHM/MEDQ  D:  11/04/2002  T:  11/04/2002  Job:  [id number]   cc:   Dr. Buzzy Han

## 2011-02-11 ENCOUNTER — Encounter: Payer: Self-pay | Admitting: Cardiovascular Disease

## 2011-05-08 ENCOUNTER — Encounter: Payer: Self-pay | Admitting: Cardiovascular Disease

## 2011-05-12 ENCOUNTER — Encounter: Payer: Self-pay | Admitting: Cardiovascular Disease

## 2011-05-12 ENCOUNTER — Ambulatory Visit (INDEPENDENT_AMBULATORY_CARE_PROVIDER_SITE_OTHER): Payer: Medicare Other | Admitting: Cardiovascular Disease

## 2011-05-12 DIAGNOSIS — E785 Hyperlipidemia, unspecified: Secondary | ICD-10-CM

## 2011-05-12 DIAGNOSIS — I10 Essential (primary) hypertension: Secondary | ICD-10-CM

## 2011-05-12 DIAGNOSIS — I25810 Atherosclerosis of coronary artery bypass graft(s) without angina pectoris: Secondary | ICD-10-CM

## 2011-05-12 DIAGNOSIS — E119 Type 2 diabetes mellitus without complications: Secondary | ICD-10-CM

## 2011-05-12 NOTE — Assessment & Plan Note (Signed)
We have encouraged continued exercise, careful diet management   

## 2011-05-12 NOTE — Assessment & Plan Note (Signed)
We will try to obtain his most recent lipid panel after he meets with his PMD

## 2011-05-12 NOTE — Progress Notes (Signed)
Patient ID: [name], male    DOB: 11/19/1939, 70 y.o.   MRN: [id number]  HPI Comments: [name] is a very pleasant 70 year old gentleman with coronary artery disease, bypass surgery in 2004, occluded graft to the diagonal branch with 3 other patent grafts to the RCA, OM with a LIMA to the LAD who presents for routine followup.   He reports that he is doing well. He is active and has been fishing recently he denies any chest pain or SOB.  He denies any lightheadedness or dizziness. He does have significant back problems and has had hydrocortisone shots. This dates back to a previous work injury.   We do not have his most recent blood work though cholesterol in 2009 was at goal with an LDL of 53 total cholesterol 115, HDL of 35. Hemoglobin A1c was 7.3   EKG shows normal sinus rhythm with rate of 62 beats per minute, no significant ST or T wave changes      Outpatient Encounter Prescriptions as of 05/12/2011  Medication Sig Dispense Refill  . allopurinol (ZYLOPRIM) 300 MG tablet Take 300 mg by mouth daily.        Marland Kitchen amLODipine-atorvastatin (CADUET) 5-10 MG per tablet Take 1 tablet by mouth daily.        Marland Kitchen aspirin (ASPIR-81) 81 MG EC tablet Take 81 mg by mouth daily.        . carvedilol (COREG) 6.25 MG tablet Take 6.25 mg by mouth 2 (two) times daily.        . clopidogrel (PLAVIX) 75 MG tablet Take 75 mg by mouth daily.        . digoxin (LANOXIN) 0.125 MG tablet Take 125 mcg by mouth daily.        . isosorbide mononitrate (IMDUR) 30 MG 24 hr tablet Take 30 mg by mouth 2 (two) times daily.        . pioglitazone (ACTOS) 30 MG tablet Take 30 mg by mouth daily.        . ranolazine (RANEXA) 500 MG 12 hr tablet Take 500 mg by mouth daily.           Review of Systems  Constitutional: Negative.   HENT: Negative.   Eyes: Negative.   Respiratory: Negative.   Cardiovascular: Negative.   Gastrointestinal: Negative.   Musculoskeletal: Negative.   Skin: Negative.   Neurological:  Negative.   Hematological: Negative.   Psychiatric/Behavioral: Negative.   All other systems reviewed and are negative.    BP 140/70  Pulse 60  Ht 5\' 10"  (1.778 m)  Wt 190 lb (86.183 kg)  BMI 27.26 kg/m2   Physical Exam  Nursing note and vitals reviewed. Constitutional: He is oriented to person, place, and time. He appears well-developed and well-nourished.  HENT:  Head: Normocephalic.  Nose: Nose normal.  Mouth/Throat: Oropharynx is clear and moist.  Eyes: Conjunctivae are normal. Pupils are equal, round, and reactive to light.  Neck: Normal range of motion. Neck supple. No JVD present.  Cardiovascular: Normal rate, regular rhythm, S1 normal, S2 normal, normal heart sounds and intact distal pulses.  Exam reveals no gallop and no friction rub.   No murmur heard. Pulmonary/Chest: Effort normal and breath sounds normal. No respiratory distress. He has no wheezes. He has no rales. He exhibits no tenderness.  Abdominal: Soft. Bowel sounds are normal. He exhibits no distension. There is no tenderness.  Musculoskeletal: Normal range of motion. He exhibits no edema and no tenderness.  Lymphadenopathy:  He has no cervical adenopathy.  Neurological: He is alert and oriented to person, place, and time. Coordination normal.  Skin: Skin is warm and dry. No rash noted. No erythema.  Psychiatric: He has a normal mood and affect. His behavior is normal. Judgment and thought content normal.           Assessment and Plan

## 2011-05-12 NOTE — Patient Instructions (Addendum)
You are doing well. Please hold ranexa If you start to get chest discomfort, go back on ranexa twice a day Please send your new cholesterol to the office when available Please call us if you have new issues that need to be addressed before your next appt.  The office will contact you for a follow up Appt. In 6 months

## 2011-05-12 NOTE — Assessment & Plan Note (Addendum)
Currently with no symptoms of angina. No further workup at this time. He reports the ranexa is expensive. We have suggested he hold the medication for now and restart if he has chest pain.

## 2011-05-12 NOTE — Assessment & Plan Note (Signed)
Blood pressure is well controlled on today's visit. No changes made to the medications. 

## 2011-11-11 ENCOUNTER — Ambulatory Visit (INDEPENDENT_AMBULATORY_CARE_PROVIDER_SITE_OTHER): Payer: Medicare Other | Admitting: Cardiovascular Disease

## 2011-11-11 ENCOUNTER — Encounter: Payer: Self-pay | Admitting: Cardiovascular Disease

## 2011-11-11 VITALS — BP 130/75 | HR 60 | Ht 68.0 in | Wt 187.1 lb

## 2011-11-11 DIAGNOSIS — I25810 Atherosclerosis of coronary artery bypass graft(s) without angina pectoris: Secondary | ICD-10-CM

## 2011-11-11 DIAGNOSIS — E119 Type 2 diabetes mellitus without complications: Secondary | ICD-10-CM

## 2011-11-11 DIAGNOSIS — E785 Hyperlipidemia, unspecified: Secondary | ICD-10-CM

## 2011-11-11 DIAGNOSIS — I2510 Atherosclerotic heart disease of native coronary artery without angina pectoris: Secondary | ICD-10-CM

## 2011-11-11 DIAGNOSIS — I10 Essential (primary) hypertension: Secondary | ICD-10-CM

## 2011-11-11 DIAGNOSIS — Z8679 Personal history of other diseases of the circulatory system: Secondary | ICD-10-CM

## 2011-11-11 NOTE — Progress Notes (Signed)
Patient ID: [name], male    DOB: 11/19/1939, 71 y.o.   MRN: [id number]  HPI Comments: [name] is a very pleasant 71 year old gentleman with coronary artery disease, bypass surgery in 2004, occluded graft to the diagonal branch with 3 other patent grafts to the RCA, OM with a LIMA to the LAD who presents for routine followup.   He reports that he is doing well. He is active. He does have significant back problems. Previous cortisone shots. This dates back to a previous work injury. He reports his sugar has been running mildly elevated as he has been eating more candy.    No recent blood work available. This has been done by Dr. Lacie Scotts.   EKG shows normal sinus rhythm with rate of 60 beats per minute, no significant ST or T wave changes     Outpatient Encounter Prescriptions as of 11/11/2011  Medication Sig Dispense Refill  . allopurinol (ZYLOPRIM) 300 MG tablet Take 300 mg by mouth daily.        Marland Kitchen amLODipine-atorvastatin (CADUET) 5-10 MG per tablet Take 1 tablet by mouth daily.        Marland Kitchen aspirin (ASPIR-81) 81 MG EC tablet Take 81 mg by mouth daily.        . carvedilol (COREG) 6.25 MG tablet Take 6.25 mg by mouth 2 (two) times daily.        . clopidogrel (PLAVIX) 75 MG tablet Take 75 mg by mouth daily.        . digoxin (LANOXIN) 0.125 MG tablet Take 125 mcg by mouth daily.        . isosorbide mononitrate (IMDUR) 30 MG 24 hr tablet Take 30 mg by mouth 2 (two) times daily.        . pioglitazone (ACTOS) 30 MG tablet Take 30 mg by mouth daily.        . ranolazine (RANEXA) 500 MG 12 hr tablet Take 500 mg by mouth daily.          Review of Systems  Constitutional: Negative.   HENT: Negative.   Eyes: Negative.   Respiratory: Negative.   Cardiovascular: Negative.   Gastrointestinal: Negative.   Musculoskeletal: Positive for back pain and arthralgias.  Skin: Negative.   Neurological: Negative.   Hematological: Negative.   Psychiatric/Behavioral: Negative.   All other systems  reviewed and are negative.    BP 137/73  Pulse 60  Ht 5\' 8"  (1.727 m)  Wt 187 lb 1.9 oz (84.877 kg)  BMI 28.45 kg/m2   Physical Exam  Nursing note and vitals reviewed. Constitutional: He is oriented to person, place, and time. He appears well-developed and well-nourished.  HENT:  Head: Normocephalic.  Nose: Nose normal.  Mouth/Throat: Oropharynx is clear and moist.  Eyes: Conjunctivae are normal. Pupils are equal, round, and reactive to light.  Neck: Normal range of motion. Neck supple. No JVD present.  Cardiovascular: Normal rate, regular rhythm, S1 normal, S2 normal and intact distal pulses.  Exam reveals no gallop and no friction rub.   Murmur heard.  Systolic murmur is present with a grade of 1/6  Pulmonary/Chest: Effort normal and breath sounds normal. No respiratory distress. He has no wheezes. He has no rales. He exhibits no tenderness.  Abdominal: Soft. Bowel sounds are normal. He exhibits no distension. There is no tenderness.  Musculoskeletal: Normal range of motion. He exhibits no edema and no tenderness.  Lymphadenopathy:    He has no cervical adenopathy.  Neurological: He is alert and  oriented to person, place, and time. Coordination normal.  Skin: Skin is warm and dry. No rash noted. No erythema.  Psychiatric: He has a normal mood and affect. His behavior is normal. Judgment and thought content normal.           Assessment and Plan

## 2011-11-11 NOTE — Assessment & Plan Note (Signed)
We have suggested he stay on his statin. Goal LDL less than 70. We will try to obtain recent lipids from Dr. Lacie Scotts.

## 2011-11-11 NOTE — Assessment & Plan Note (Signed)
Repeat blood pressure check was within acceptable range. No medication changes made.

## 2011-11-11 NOTE — Assessment & Plan Note (Signed)
We've asked him to decrease the candy as this is  pushing his sugars up

## 2011-11-11 NOTE — Assessment & Plan Note (Signed)
Currently with no symptoms of angina. No further workup at this time. Continue current medication regimen. 

## 2011-11-11 NOTE — Patient Instructions (Signed)
You are doing well. No medication changes were made.  Check your medication list at home and call the office if our list is not the same  Please call us if you have new issues that need to be addressed before your next appt.  Your physician wants you to follow-up in: 6 months.  You will receive a reminder letter in the mail two months in advance. If you don't receive a letter, please call our office to schedule the follow-up appointment.

## 2011-11-12 ENCOUNTER — Telehealth: Payer: Self-pay

## 2011-11-12 NOTE — Telephone Encounter (Signed)
Took the plavix and ranexa off the patient's medication list.

## 2011-11-12 NOTE — Telephone Encounter (Signed)
Per Dr. Windell Hummingbird request need to take plavix and ranexa off his medication list.

## 2011-11-12 NOTE — Telephone Encounter (Signed)
Message copied by Festus Aloe on Wed Nov 12, 2011  1:58 PM ------      Message from: Phoebe Sharps      Created: Wed Nov 12, 2011 12:30 PM       Can we take these off his list      thx      tim      ----- Message -----         From: Angelina Sheriff Little         Sent: 11/12/2011   8:27 AM           To: Antonieta Iba, MD            Pt stopped by this am to let you know that he is no longer taking plavix or ranexa. He was seen yesterday and was told to let you know if he was taking them or not.

## 2011-11-12 NOTE — Telephone Encounter (Signed)
Patient states was not taking the plavix and ranexa when came in the office yesterday. Please advise if patient should continue the medications.

## 2012-05-11 ENCOUNTER — Ambulatory Visit (INDEPENDENT_AMBULATORY_CARE_PROVIDER_SITE_OTHER): Payer: Medicare Other | Admitting: Cardiovascular Disease

## 2012-05-11 ENCOUNTER — Encounter: Payer: Self-pay | Admitting: Cardiovascular Disease

## 2012-05-11 VITALS — BP 124/68 | HR 67 | Ht 70.0 in | Wt 184.0 lb

## 2012-05-11 DIAGNOSIS — E119 Type 2 diabetes mellitus without complications: Secondary | ICD-10-CM

## 2012-05-11 DIAGNOSIS — I25810 Atherosclerosis of coronary artery bypass graft(s) without angina pectoris: Secondary | ICD-10-CM

## 2012-05-11 DIAGNOSIS — I10 Essential (primary) hypertension: Secondary | ICD-10-CM

## 2012-05-11 DIAGNOSIS — R079 Chest pain, unspecified: Secondary | ICD-10-CM

## 2012-05-11 DIAGNOSIS — E785 Hyperlipidemia, unspecified: Secondary | ICD-10-CM

## 2012-05-11 NOTE — Progress Notes (Signed)
Patient ID: [name], male    DOB: 11/19/1939, 71 y.o.   MRN: [id number]  HPI Comments: [name] is a very pleasant 71 year old gentleman with coronary artery disease, bypass surgery in 2004, occluded graft to the diagonal branch with 3 other patent grafts to the RCA, OM with a LIMA to the LAD who presents for routine followup.   He reports that he is doing well. He is active. He does have significant back problems. Previous cortisone shots. He rarely takes a back pain pill. This dates back to a previous work injury. He also has rare left-sided chest pain typically at rest while sitting, last episode 2 weeks ago lasting for less than 1 minute. 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   Recent blood work shows total cholesterol 131, LDL 77, glucose 134, hemoglobin A1c 6.8, hematocrit 41  EKG shows normal sinus rhythm with rate of 66 beats per minute, nonspecific ST abnormality     Outpatient Encounter Prescriptions as of 05/11/2012  Medication Sig Dispense Refill  . allopurinol (ZYLOPRIM) 300 MG tablet Take 300 mg by mouth daily.        Marland Kitchen amLODipine-atorvastatin (CADUET) 5-10 MG per tablet Take 1 tablet by mouth daily.        Marland Kitchen aspirin (ASPIR-81) 81 MG EC tablet Take 81 mg by mouth daily.        . carvedilol (COREG) 6.25 MG tablet Take 6.25 mg by mouth 2 (two) times daily.        . digoxin (LANOXIN) 0.125 MG tablet Take 125 mcg by mouth daily.        . isosorbide mononitrate (IMDUR) 30 MG 24 hr tablet Take 30 mg by mouth 2 (two) times daily.        . pioglitazone (ACTOS) 30 MG tablet Take 30 mg by mouth daily.          Review of Systems  Constitutional: Negative.   HENT: Negative.   Eyes: Negative.   Respiratory: Negative.   Cardiovascular: Negative.   Gastrointestinal: Negative.   Musculoskeletal: Positive for back pain and arthralgias.  Skin: Negative.   Neurological: Negative.   Hematological: Negative.   Psychiatric/Behavioral: Negative.   All  other systems reviewed and are negative.    BP 137/73  Pulse 60  Ht 5\' 8"  (1.727 m)  Wt 187 lb 1.9 oz (84.877 kg)  BMI 28.45 kg/m2  Physical Exam  Nursing note and vitals reviewed. Constitutional: He is oriented to person, place, and time. He appears well-developed and well-nourished.  HENT:  Head: Normocephalic.  Nose: Nose normal.  Mouth/Throat: Oropharynx is clear and moist.  Eyes: Conjunctivae normal are normal. Pupils are equal, round, and reactive to light.  Neck: Normal range of motion. Neck supple. No JVD present.  Cardiovascular: Normal rate, regular rhythm, S1 normal, S2 normal and intact distal pulses.  Exam reveals no gallop and no friction rub.   Murmur heard.  Systolic murmur is present with a grade of 1/6  Pulmonary/Chest: Effort normal and breath sounds normal. No respiratory distress. He has no wheezes. He has no rales. He exhibits no tenderness.  Abdominal: Soft. Bowel sounds are normal. He exhibits no distension. There is no tenderness.  Musculoskeletal: Normal range of motion. He exhibits no edema and no tenderness.  Lymphadenopathy:    He has no cervical adenopathy.  Neurological: He is alert and oriented to person, place, and time. Coordination normal.  Skin: Skin is warm and dry.  No rash noted. No erythema.  Psychiatric: He has a normal mood and affect. His behavior is normal. Judgment and thought content normal.           Assessment and Plan

## 2012-05-11 NOTE — Assessment & Plan Note (Signed)
Cholesterol is at goal on the current lipid regimen. No changes to the medications were made.  

## 2012-05-11 NOTE — Assessment & Plan Note (Signed)
We have encouraged continued exercise, careful diet management in an effort to lose weight. 

## 2012-05-11 NOTE — Assessment & Plan Note (Signed)
Currently with no symptoms of angina. No further workup at this time. Continue current medication regimen. 

## 2012-05-11 NOTE — Patient Instructions (Addendum)
You are doing well. No medication changes were made.  Please call us if you have new issues that need to be addressed before your next appt.  Your physician wants you to follow-up in: 6 months.  You will receive a reminder letter in the mail two months in advance. If you don't receive a letter, please call our office to schedule the follow-up appointment.   

## 2012-05-11 NOTE — Assessment & Plan Note (Signed)
Blood pressure is well controlled on today's visit. No changes made to the medications. 

## 2012-11-10 ENCOUNTER — Ambulatory Visit: Payer: Medicare Other | Admitting: Cardiovascular Disease

## 2012-11-26 ENCOUNTER — Ambulatory Visit (INDEPENDENT_AMBULATORY_CARE_PROVIDER_SITE_OTHER): Payer: Medicare Other | Admitting: Cardiovascular Disease

## 2012-11-26 ENCOUNTER — Encounter: Payer: Self-pay | Admitting: Cardiovascular Disease

## 2012-11-26 VITALS — BP 102/62 | HR 72 | Ht 70.0 in | Wt 179.0 lb

## 2012-11-26 DIAGNOSIS — E119 Type 2 diabetes mellitus without complications: Secondary | ICD-10-CM

## 2012-11-26 DIAGNOSIS — I25810 Atherosclerosis of coronary artery bypass graft(s) without angina pectoris: Secondary | ICD-10-CM

## 2012-11-26 DIAGNOSIS — R9431 Abnormal electrocardiogram [ECG] [EKG]: Secondary | ICD-10-CM | POA: Insufficient documentation

## 2012-11-26 DIAGNOSIS — E785 Hyperlipidemia, unspecified: Secondary | ICD-10-CM

## 2012-11-26 DIAGNOSIS — I10 Essential (primary) hypertension: Secondary | ICD-10-CM

## 2012-11-26 NOTE — Patient Instructions (Addendum)
You are doing well. Blood pressure is low today, Hold isosorbide in the evening until blood pressure comes back up.  Please come in for EKG in one month given recent changes Call us for any shortness of breath or chest pain  Please call us if you have new issues that need to be addressed before your next appt.  Your physician wants you to follow-up in: 6 months.  You will receive a reminder letter in the mail two months in advance. If you don't receive a letter, please call our office to schedule the follow-up appointment.

## 2012-11-26 NOTE — Assessment & Plan Note (Signed)
Dramatic EKG changes compared to prior visit 6 months ago. I suspect this could be secondary to recent viral gastroenteritis and electrolyte changes as diffuse ST and T wave abnormality is in most leads. We will order an EKG in one month. Currently asymptomatic with no shortness of breath or chest pain. Encouraged him to drink fluids, replete potassium

## 2012-11-26 NOTE — Assessment & Plan Note (Signed)
Cholesterol is at goal on the current lipid regimen. No changes to the medications were made.  

## 2012-11-26 NOTE — Assessment & Plan Note (Signed)
Have asked him to monitor his blood pressure at home. I suspect he is still mildly dehydrated. Weight is down 8 pounds. He can hold one of the isosorbide until blood pressure improves.

## 2012-11-26 NOTE — Assessment & Plan Note (Signed)
No recent symptoms concerning for angina. Recent change in his EKG. We will check this again in several weeks' time after recent gastroenteritis has resolved.

## 2012-11-26 NOTE — Assessment & Plan Note (Signed)
He reports sugars have been well controlled.

## 2012-11-26 NOTE — Progress Notes (Signed)
Patient ID: [name], male    DOB: 1940/10/05, 72 y.o.   MRN: [id number]  HPI Comments: [name] is a very pleasant 72 year old gentleman with coronary artery disease, bypass surgery in 2004, occluded graft to the diagonal branch with 3 other patent grafts to the RCA, OM with a LIMA to the LAD who presents for routine followup.   At baseline is typically active. No recent complaints apart from viral gastroenteritis this past week. Profound nausea and vomiting, weight loss of 13 pounds. Weight loss today compared to prior visit is 8 pounds. Otherwise she feels back to normal. Still tired after recent illness.  He does have significant back problems in the past. Previous cortisone shots. He rarely takes a back pain pill. This dates back to a previous work injury. He also has rare left-sided chest pain typically at rest while sitting.  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   Recent blood work shows total cholesterol 131, LDL 77, glucose 134, hemoglobin A1c 6.8, hematocrit 41  EKG shows normal sinus rhythm with rate of 72 beats per minute, diffuse ST and T wave abnormality in V1 through V6, lead 3, aVF  Changes are  new from prior EKG in 2013     Outpatient Encounter Prescriptions as of 11/26/2012  Medication Sig Dispense Refill  . allopurinol (ZYLOPRIM) 300 MG tablet Take 300 mg by mouth daily.        Marland Kitchen amLODipine-atorvastatin (CADUET) 5-10 MG per tablet Take 1 tablet by mouth daily.        Marland Kitchen aspirin (ASPIR-81) 81 MG EC tablet Take 81 mg by mouth daily.        . carvedilol (COREG) 6.25 MG tablet Take 6.25 mg by mouth 2 (two) times daily.        . digoxin (LANOXIN) 0.125 MG tablet Take 125 mcg by mouth daily.        . isosorbide mononitrate (IMDUR) 30 MG 24 hr tablet Take 30 mg by mouth 2 (two) times daily.        . pioglitazone (ACTOS) 30 MG tablet Take 30 mg by mouth daily.         No facility-administered encounter medications on file as of 11/26/2012.     Review of Systems  Constitutional: Negative.   HENT: Negative.   Eyes: Negative.   Respiratory: Negative.   Cardiovascular: Negative.   Gastrointestinal: Negative.        Resolved nausea and vomiting  Musculoskeletal: Positive for back pain and arthralgias.  Skin: Negative.   Neurological: Negative.   Psychiatric/Behavioral: Negative.   All other systems reviewed and are negative.    BP 102/62  Pulse 72  Ht 5\' 10"  (1.778 m)  Wt 179 lb (81.194 kg)  BMI 25.68 kg/m2  Physical Exam  Nursing note and vitals reviewed. Constitutional: He is oriented to person, place, and time. He appears well-developed and well-nourished.  HENT:  Head: Normocephalic.  Nose: Nose normal.  Mouth/Throat: Oropharynx is clear and moist.  Eyes: Conjunctivae are normal. Pupils are equal, round, and reactive to light.  Neck: Normal range of motion. Neck supple. No JVD present.  Cardiovascular: Normal rate, regular rhythm, S1 normal, S2 normal and intact distal pulses.  Exam reveals no gallop and no friction rub.   Murmur heard.  Systolic murmur is present with a grade of 1/6  Pulmonary/Chest: Effort normal and breath sounds normal. No respiratory distress. He has no wheezes. He has no rales.  He exhibits no tenderness.  Abdominal: Soft. Bowel sounds are normal. He exhibits no distension. There is no tenderness.  Musculoskeletal: Normal range of motion. He exhibits no edema and no tenderness.  Lymphadenopathy:    He has no cervical adenopathy.  Neurological: He is alert and oriented to person, place, and time. Coordination normal.  Skin: Skin is warm and dry. No rash noted. No erythema.  Psychiatric: He has a normal mood and affect. His behavior is normal. Judgment and thought content normal.      Assessment and Plan

## 2012-12-27 ENCOUNTER — Ambulatory Visit (INDEPENDENT_AMBULATORY_CARE_PROVIDER_SITE_OTHER): Payer: Medicare Other

## 2012-12-27 VITALS — BP 122/62 | HR 65 | Ht 70.0 in | Wt 181.5 lb

## 2012-12-27 DIAGNOSIS — I2510 Atherosclerotic heart disease of native coronary artery without angina pectoris: Secondary | ICD-10-CM

## 2012-12-27 NOTE — Patient Instructions (Addendum)
Continue current regimen

## 2012-12-27 NOTE — Progress Notes (Signed)
Pt here for EKG at request of Dr. Mariah Milling at last OV EKG at last OV was abnormal secondary to recent GI virus  Today pt has no complaints and EKG has returned close to baseline I will have Dr. Mariah Milling review

## 2013-04-25 ENCOUNTER — Telehealth: Payer: Self-pay

## 2013-04-25 NOTE — Telephone Encounter (Signed)
Error

## 2013-04-27 ENCOUNTER — Ambulatory Visit (INDEPENDENT_AMBULATORY_CARE_PROVIDER_SITE_OTHER): Payer: Medicare Other | Admitting: Cardiovascular Disease

## 2013-04-27 ENCOUNTER — Encounter: Payer: Self-pay | Admitting: Cardiovascular Disease

## 2013-04-27 VITALS — BP 158/80 | HR 61 | Ht 70.0 in | Wt 182.5 lb

## 2013-04-27 DIAGNOSIS — E119 Type 2 diabetes mellitus without complications: Secondary | ICD-10-CM

## 2013-04-27 DIAGNOSIS — I10 Essential (primary) hypertension: Secondary | ICD-10-CM

## 2013-04-27 DIAGNOSIS — E785 Hyperlipidemia, unspecified: Secondary | ICD-10-CM

## 2013-04-27 DIAGNOSIS — R079 Chest pain, unspecified: Secondary | ICD-10-CM

## 2013-04-27 DIAGNOSIS — I25810 Atherosclerosis of coronary artery bypass graft(s) without angina pectoris: Secondary | ICD-10-CM

## 2013-04-27 MED ORDER — NITROGLYCERIN 0.4 MG SL SUBL: 0.4000 mg | SUBLINGUAL_TABLET | SUBLINGUAL | Status: DC | PRN

## 2013-04-27 NOTE — Assessment & Plan Note (Signed)
Cholesterol is mildly elevated. Encouraged him to watch his diet, work on his diabetes. Hemoglobin A1c slightly elevated

## 2013-04-27 NOTE — Patient Instructions (Addendum)
You are doing well. No medication changes were made.  Monitor your blood pressure at home Call the office if it runs consistently >140  Please call us if you have new issues that need to be addressed before your next appt.  Your physician wants you to follow-up in: 6 months.  You will receive a reminder letter in the mail two months in advance. If you don't receive a letter, please call our office to schedule the follow-up appointment.

## 2013-04-27 NOTE — Assessment & Plan Note (Signed)
Hemoglobin A1c 7.0. Discussed that this is above goal. Encouraged him to watch his diet, lose weight

## 2013-04-27 NOTE — Assessment & Plan Note (Signed)
Blood pressure is well controlled on today's visit. No changes made to the medications. 

## 2013-04-27 NOTE — Assessment & Plan Note (Signed)
Currently with no symptoms of angina. No further workup at this time. Continue current medication regimen. 

## 2013-04-27 NOTE — Progress Notes (Signed)
Patient ID: [name], male    DOB: 09/11/1940, 72 y.o.   MRN: [id number]  HPI Comments: [name] is a very pleasant 72 year old gentleman with coronary artery disease, bypass surgery in 2004, occluded graft to the diagonal branch with 3 other patent grafts to the RCA, OM with a LIMA to the LAD who presents for routine followup.   At baseline is typically active. He does report having chronic back pain. 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 Denies having any chest pain or shortness of breath  He does have significant back problems in the past. Previous cortisone shots. He rarely takes a back pain pill. This dates back to a previous work injury.  Has had previous episodes of  left-sided chest pain typically at rest while sitting.Not the issue recently.  he is active on his farm, does a lot of gardening.   LDL 100, HDL 37, total cholesterol 914 Most recent hemoglobin A1c 7.0  EKG shows normal sinus rhythm with rate of 61 beats per minute, diffuse ST and T wave abnormality in V1 through V6, lead 3, aVF  Changes are  new from prior EKG in 2013     Outpatient Encounter Prescriptions as of 04/27/2013  Medication Sig Dispense Refill  . allopurinol (ZYLOPRIM) 300 MG tablet Take 300 mg by mouth daily.        Marland Kitchen amLODipine-atorvastatin (CADUET) 5-10 MG per tablet Take 1 tablet by mouth daily.        Marland Kitchen aspirin (ASPIR-81) 81 MG EC tablet Take 81 mg by mouth daily.        . carvedilol (COREG) 6.25 MG tablet Take 6.25 mg by mouth 2 (two) times daily.        . digoxin (LANOXIN) 0.125 MG tablet Take 125 mcg by mouth daily.        . isosorbide mononitrate (IMDUR) 30 MG 24 hr tablet Take 30 mg by mouth 2 (two) times daily.        . pioglitazone (ACTOS) 30 MG tablet Take 30 mg by mouth daily.         No facility-administered encounter medications on file as of 04/27/2013.    Review of Systems  Constitutional: Negative.   HENT: Negative.   Eyes: Negative.   Respiratory:  Negative.   Cardiovascular: Negative.   Gastrointestinal: Negative.        Resolved nausea and vomiting  Endocrine: Negative.   Musculoskeletal: Positive for back pain and arthralgias.  Skin: Negative.   Allergic/Immunologic: Negative.   Neurological: Negative.   Hematological: Negative.   Psychiatric/Behavioral: Negative.   All other systems reviewed and are negative.    BP 158/80  Ht 5\' 10"  (1.778 m)  Wt 182 lb 8 oz (82.781 kg)  BMI 26.19 kg/m2  Physical Exam  Nursing note and vitals reviewed. Constitutional: He is oriented to person, place, and time. He appears well-developed and well-nourished.  HENT:  Head: Normocephalic.  Nose: Nose normal.  Mouth/Throat: Oropharynx is clear and moist.  Eyes: Conjunctivae are normal. Pupils are equal, round, and reactive to light.  Neck: Normal range of motion. Neck supple. No JVD present.  Cardiovascular: Normal rate, regular rhythm, S1 normal, S2 normal and intact distal pulses.  Exam reveals no gallop and no friction rub.   Murmur heard.  Systolic murmur is present with a grade of 1/6  Pulmonary/Chest: Effort normal and breath sounds normal. No respiratory distress. He has no wheezes. He has no rales. He exhibits no  tenderness.  Abdominal: Soft. Bowel sounds are normal. He exhibits no distension. There is no tenderness.  Musculoskeletal: Normal range of motion. He exhibits no edema and no tenderness.  Lymphadenopathy:    He has no cervical adenopathy.  Neurological: He is alert and oriented to person, place, and time. Coordination normal.  Skin: Skin is warm and dry. No rash noted. No erythema.  Psychiatric: He has a normal mood and affect. His behavior is normal. Judgment and thought content normal.      Assessment and Plan

## 2013-10-19 ENCOUNTER — Encounter: Payer: Self-pay | Admitting: Cardiovascular Disease

## 2013-10-19 ENCOUNTER — Ambulatory Visit: Payer: Medicare Other | Admitting: Cardiovascular Disease

## 2013-10-19 ENCOUNTER — Ambulatory Visit (INDEPENDENT_AMBULATORY_CARE_PROVIDER_SITE_OTHER): Payer: Medicare Other | Admitting: Cardiovascular Disease

## 2013-10-19 VITALS — BP 142/78 | HR 69 | Ht 70.0 in | Wt 189.2 lb

## 2013-10-19 DIAGNOSIS — I25810 Atherosclerosis of coronary artery bypass graft(s) without angina pectoris: Secondary | ICD-10-CM

## 2013-10-19 DIAGNOSIS — I10 Essential (primary) hypertension: Secondary | ICD-10-CM

## 2013-10-19 DIAGNOSIS — E785 Hyperlipidemia, unspecified: Secondary | ICD-10-CM

## 2013-10-19 DIAGNOSIS — E119 Type 2 diabetes mellitus without complications: Secondary | ICD-10-CM

## 2013-10-19 MED ORDER — AMLODIPINE-ATORVASTATIN 5-20 MG PO TABS: 1.0000 | ORAL_TABLET | Freq: Every day | ORAL | Status: DC

## 2013-10-19 NOTE — Patient Instructions (Addendum)
You are doing well. Please change the caduet to 5/20 mg daily  Please call us if you have new issues that need to be addressed before your next appt.  Your physician wants you to follow-up in: 12 months.  You will receive a reminder letter in the mail two months in advance. If you don't receive a letter, please call our office to schedule the follow-up appointment.

## 2013-10-19 NOTE — Assessment & Plan Note (Signed)
We have encouraged continued exercise, careful diet management in an effort to lose weight. 

## 2013-10-19 NOTE — Assessment & Plan Note (Signed)
Blood pressure is well controlled on today's visit. No changes made to the medications. 

## 2013-10-19 NOTE — Assessment & Plan Note (Signed)
Will increase lotrel up to 5/20 mg as LDL goal <70.

## 2013-10-19 NOTE — Assessment & Plan Note (Signed)
Currently with no symptoms of angina. No further workup at this time. Continue current medication regimen. 

## 2013-10-19 NOTE — Progress Notes (Signed)
Patient ID: [name], male    DOB: 1941-04-19, 73 y.o.   MRN: [id number]  HPI Comments: [name] is a very pleasant 73 year old gentleman with coronary artery disease, bypass surgery in 2004, occluded graft to the diagonal branch with 3 other patent grafts to the RCA, OM with a LIMA to the LAD who presents for routine followup.   At baseline is typically active. He still continues to have chronic back pain. 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 Denies having any chest pain or shortness of breath  Previous cortisone shots in his back,  rarely takes a back pain pill. This dates back to a previous work injury.  No significant chest pain. he is active on his farm, does a lot of gardening. Difficulty with taking care of his wife recently who is in and out of the hospital   Recent lab work showing total cholesterol 146, LDL 91 Most recent hemoglobin A1c 7.6  EKG shows normal sinus rhythm with rate of 69 beats per minute, diffuse ST and T wave abnormality in V1 through V6, lead 3, aVF  Changes are  new from prior EKG in 2013     Outpatient Encounter Prescriptions as of 10/19/2013  Medication Sig  . allopurinol (ZYLOPRIM) 300 MG tablet Take 300 mg by mouth daily.    Marland Kitchen amLODipine-atorvastatin (CADUET) 5-10 MG per tablet Take 1 tablet by mouth daily.    Marland Kitchen aspirin (ASPIR-81) 81 MG EC tablet Take 81 mg by mouth daily.    . carvedilol (COREG) 6.25 MG tablet Take 6.25 mg by mouth 2 (two) times daily.    . digoxin (LANOXIN) 0.125 MG tablet Take 125 mcg by mouth daily.    . isosorbide mononitrate (IMDUR) 30 MG 24 hr tablet Take 30 mg by mouth 2 (two) times daily.    . metFORMIN (GLUCOPHAGE) 1000 MG tablet Take 1,000 mg by mouth 2 (two) times daily with a meal.   . nitroGLYCERIN (NITROSTAT) 0.4 MG SL tablet Place 1 tablet (0.4 mg total) under the tongue every 5 (five) minutes as needed for chest pain.  . pioglitazone (ACTOS) 30 MG tablet Take 30 mg by mouth daily.    .  promethazine (PHENERGAN) 25 MG tablet Take 25 mg by mouth every 6 (six) hours as needed.    Review of Systems  Constitutional: Negative.   HENT: Negative.   Eyes: Negative.   Respiratory: Negative.   Cardiovascular: Negative.   Gastrointestinal: Negative.   Endocrine: Negative.   Musculoskeletal: Positive for arthralgias and back pain.  Skin: Negative.   Allergic/Immunologic: Negative.   Neurological: Negative.   Hematological: Negative.   Psychiatric/Behavioral: Negative.   All other systems reviewed and are negative.    BP 142/78  Pulse 69  Ht 5\' 10"  (1.778 m)  Wt 189 lb 4 oz (85.843 kg)  BMI 27.15 kg/m2  Physical Exam  Nursing note and vitals reviewed. Constitutional: He is oriented to person, place, and time. He appears well-developed and well-nourished.  HENT:  Head: Normocephalic.  Nose: Nose normal.  Mouth/Throat: Oropharynx is clear and moist.  Eyes: Conjunctivae are normal. Pupils are equal, round, and reactive to light.  Neck: Normal range of motion. Neck supple. No JVD present.  Cardiovascular: Normal rate, regular rhythm, S1 normal, S2 normal and intact distal pulses.  Exam reveals no gallop and no friction rub.   Murmur heard.  Systolic murmur is present with a grade of 1/6  Pulmonary/Chest: Effort normal and breath sounds  normal. No respiratory distress. He has no wheezes. He has no rales. He exhibits no tenderness.  Abdominal: Soft. Bowel sounds are normal. He exhibits no distension. There is no tenderness.  Musculoskeletal: Normal range of motion. He exhibits no edema and no tenderness.  Lymphadenopathy:    He has no cervical adenopathy.  Neurological: He is alert and oriented to person, place, and time. Coordination normal.  Skin: Skin is warm and dry. No rash noted. No erythema.  Psychiatric: He has a normal mood and affect. His behavior is normal. Judgment and thought content normal.      Assessment and Plan

## 2014-10-23 ENCOUNTER — Encounter: Payer: Self-pay | Admitting: Cardiovascular Disease

## 2014-10-23 ENCOUNTER — Ambulatory Visit (INDEPENDENT_AMBULATORY_CARE_PROVIDER_SITE_OTHER): Payer: Self-pay | Admitting: Cardiovascular Disease

## 2014-10-23 VITALS — BP 140/62 | HR 65 | Ht 68.0 in | Wt 179.5 lb

## 2014-10-23 DIAGNOSIS — E118 Type 2 diabetes mellitus with unspecified complications: Secondary | ICD-10-CM

## 2014-10-23 DIAGNOSIS — I25719 Atherosclerosis of autologous vein coronary artery bypass graft(s) with unspecified angina pectoris: Secondary | ICD-10-CM | POA: Diagnosis not present

## 2014-10-23 DIAGNOSIS — I10 Essential (primary) hypertension: Secondary | ICD-10-CM | POA: Diagnosis not present

## 2014-10-23 DIAGNOSIS — E785 Hyperlipidemia, unspecified: Secondary | ICD-10-CM

## 2014-10-23 NOTE — Progress Notes (Signed)
Patient ID: [name] AlphaHoward Hendrick Garduno Sr., male    DOB: 01-Jun-1941, 74 y.o.   MRN: [id number]  HPI Comments:  [name] is a very pleasant 74 year old gentleman with coronary artery disease, bypass surgery in 2004, occluded graft to the diagonal branch with 3 other patent grafts to the RCA, OM with a LIMA to the LAD who presents for routine followup Of his coronary artery disease .   At baseline is typically active. He continues to work 4 hours several days a week  [facility] [employer], doing [place].  Denies having any chest pain or shortness of breath   Chronic back pain. Previous cortisone shots in his back,  rarely takes a back pain pill. This dates back to a previous work injury.  No significant chest pain. he is active on his farm, does a lot of gardening.  Difficulty with taking care of his wife recently who is in and out of the hospital. He has a lift for her. She is needing home PT and nursing  EKG on today's visit shows normal sinus rhythm, diffuse ST and T wave abnormality in V1 through V6, lead 3, aVF  No new changes  Lab work from 2015  Previous  lab work showing total cholesterol 146, LDL 91  hemoglobin A1c 7.6     Allergies  Allergen Reactions  . Ramipril     Outpatient Encounter Prescriptions as of 10/23/2014  Medication Sig  . allopurinol (ZYLOPRIM) 300 MG tablet Take 300 mg by mouth daily.    Marland Kitchen. amLODipine-atorvastatin (CADUET) 5-20 MG per tablet Take 1 tablet by mouth daily.  Marland Kitchen. aspirin (ASPIR-81) 81 MG EC tablet Take 81 mg by mouth daily.    . carvedilol (COREG) 6.25 MG tablet Take 6.25 mg by mouth 2 (two) times daily.    . digoxin (LANOXIN) 0.125 MG tablet Take 125 mcg by mouth daily.    . isosorbide mononitrate (IMDUR) 30 MG 24 hr tablet Take 30 mg by mouth 2 (two) times daily.    . metFORMIN (GLUCOPHAGE) 1000 MG tablet Take 1,000 mg by mouth 2 (two) times daily with a meal.   . nitroGLYCERIN (NITROSTAT) 0.4 MG SL tablet Place 1 tablet (0.4 mg total) under the  tongue every 5 (five) minutes as needed for chest pain.  . pioglitazone (ACTOS) 30 MG tablet Take 30 mg by mouth daily.    . promethazine (PHENERGAN) 25 MG tablet Take 25 mg by mouth every 6 (six) hours as needed.     Past Medical History  Diagnosis Date  . Unstable angina     resolved. a. negative myocardial infarction  . DM2 (diabetes mellitus, type 2)     stable  . Back pain     hx  . HLD (hyperlipidemia)   . HTN (hypertension)   . GERD (gastroesophageal reflux disease)   . Gout     Past Surgical History  Procedure Laterality Date  . Coronary artery bypass graft  2004    Social History  reports that he has quit smoking. His smoking use included Cigarettes. He has a 4 pack-year smoking history. His smokeless tobacco use includes Chew. He reports that he does not drink alcohol or use illicit drugs.  Family History Family history is unknown by patient.       Review of Systems  Constitutional: Negative.   Eyes: Negative.   Respiratory: Negative.   Cardiovascular: Negative.   Gastrointestinal: Negative.   Musculoskeletal: Positive for back pain and arthralgias.  Skin: Negative.  Allergic/Immunologic: Negative.   Neurological: Negative.   Hematological: Negative.   Psychiatric/Behavioral: Negative.   All other systems reviewed and are negative.   BP 140/62 mmHg  Pulse 65  Ht  (1.727 m)  Wt 179 lb 8 oz (81.421 kg)  BMI 27.30 kg/m2  Physical Exam  Constitutional: He is oriented to person, place, and time. He appears well-developed and well-nourished.  HENT:  Head: Normocephalic.  Nose: Nose normal.  Mouth/Throat: Oropharynx is clear and moist.  Eyes: Conjunctivae are normal. Pupils are equal, round, and reactive to light.  Neck: Normal range of motion. Neck supple. No JVD present.  Cardiovascular: Normal rate, regular rhythm, S1 normal, S2 normal and intact distal pulses.  Exam reveals no gallop and no friction rub.   Murmur heard.  Systolic murmur is  present with a grade of 1/6  Pulmonary/Chest: Effort normal and breath sounds normal. No respiratory distress. He has no wheezes. He has no rales. He exhibits no tenderness.  Abdominal: Soft. Bowel sounds are normal. He exhibits no distension. There is no tenderness.  Musculoskeletal: Normal range of motion. He exhibits no edema or tenderness.  Lymphadenopathy:    He has no cervical adenopathy.  Neurological: He is alert and oriented to person, place, and time. Coordination normal.  Skin: Skin is warm and dry. No rash noted. No erythema.  Psychiatric: He has a normal mood and affect. His behavior is normal. Judgment and thought content normal.      Assessment and Plan   Nursing note and vitals reviewed.

## 2014-10-23 NOTE — Assessment & Plan Note (Signed)
Blood pressure is well controlled on today's visit. No changes made to the medications. 

## 2014-10-23 NOTE — Assessment & Plan Note (Signed)
Cholesterol close to goal. Goal LDL less than 70

## 2014-10-23 NOTE — Assessment & Plan Note (Signed)
We have encouraged continued exercise, careful diet management in an effort to lose weight. Medications managed by primary care He reports sugars at baseline in the low 100 range no new labs available.

## 2014-10-23 NOTE — Patient Instructions (Signed)
You are doing well. No medication changes were made.  Please call us if you have new issues that need to be addressed before your next appt.  Your physician wants you to follow-up in: 6 months.  You will receive a reminder letter in the mail two months in advance. If you don't receive a letter, please call our office to schedule the follow-up appointment.   

## 2014-10-23 NOTE — Assessment & Plan Note (Signed)
Currently with no symptoms of angina. No further workup at this time. Continue current medication regimen. 

## 2014-11-21 ENCOUNTER — Other Ambulatory Visit: Payer: Self-pay

## 2014-11-21 MED ORDER — AMLODIPINE-ATORVASTATIN 5-20 MG PO TABS: 1.0000 | ORAL_TABLET | Freq: Every day | ORAL | Status: DC

## 2014-11-21 NOTE — Telephone Encounter (Signed)
Refill sent for amlodipine/atorvastatin 5/20 mg take one tablet daily.

## 2014-11-21 NOTE — Telephone Encounter (Signed)
Refill sent for amlodipine/atorvastatin 5/20 mg take one tablet daily.  

## 2015-04-26 ENCOUNTER — Ambulatory Visit: Payer: Medicare Other | Admitting: Cardiovascular Disease

## 2015-05-04 ENCOUNTER — Encounter: Payer: Self-pay | Admitting: *Deleted

## 2015-05-04 ENCOUNTER — Ambulatory Visit: Payer: Medicare Other | Admitting: Cardiovascular Disease

## 2015-06-29 ENCOUNTER — Encounter: Payer: Self-pay | Admitting: Cardiovascular Disease

## 2015-06-29 ENCOUNTER — Ambulatory Visit (INDEPENDENT_AMBULATORY_CARE_PROVIDER_SITE_OTHER): Payer: Medicare Other | Admitting: Cardiovascular Disease

## 2015-06-29 VITALS — BP 145/74 | HR 68 | Ht 68.0 in | Wt 182.0 lb

## 2015-06-29 DIAGNOSIS — I10 Essential (primary) hypertension: Secondary | ICD-10-CM

## 2015-06-29 DIAGNOSIS — E118 Type 2 diabetes mellitus with unspecified complications: Secondary | ICD-10-CM | POA: Diagnosis not present

## 2015-06-29 DIAGNOSIS — I25719 Atherosclerosis of autologous vein coronary artery bypass graft(s) with unspecified angina pectoris: Secondary | ICD-10-CM

## 2015-06-29 DIAGNOSIS — I200 Unstable angina: Secondary | ICD-10-CM

## 2015-06-29 DIAGNOSIS — E785 Hyperlipidemia, unspecified: Secondary | ICD-10-CM

## 2015-06-29 NOTE — Assessment & Plan Note (Signed)
No significant chest pain in several years. Last catheterization 2007. No further workup at this time. Overall doing well We have requested lab work

## 2015-06-29 NOTE — Progress Notes (Signed)
Patient ID: [name] AlphaHoward Hendrick Brodowski Sr., male    DOB: 1941/06/12, 74 y.o.   MRN: [id number]  HPI Comments:  [name] is a very pleasant 74 year old gentleman with coronary artery disease, bypass surgery in 2004, occluded graft to the diagonal branch with 3 other patent grafts to the RCA, OM with a LIMA to the LAD by catheterization in 2007 who presents for routine followup Of his coronary artery disease .   In follow-up, he denies any significant angina Lab work not available from Dr. 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  Denies having any chest pain or shortness of breath   Chronic back pain. Previous cortisone shots in his back,   EKG on today's visit shows normal sinus rhythm with rate 69 bpm, no significant ST or T-wave changes   Lab work from 2015  Previous  lab work showing total cholesterol 146, LDL 91  hemoglobin A1c 7.6     Allergies  Allergen Reactions  . Ramipril     Outpatient Encounter Prescriptions as of 06/29/2015  Medication Sig  . allopurinol (ZYLOPRIM) 300 MG tablet Take 300 mg by mouth daily.    Marland Kitchen. amLODipine-atorvastatin (CADUET) 5-20 MG per tablet Take 1 tablet by mouth daily.  Marland Kitchen. aspirin (ASPIR-81) 81 MG EC tablet Take 81 mg by mouth daily.    . carvedilol (COREG) 6.25 MG tablet Take 6.25 mg by mouth 2 (two) times daily.    . digoxin (LANOXIN) 0.125 MG tablet Take 125 mcg by mouth daily.    . isosorbide mononitrate (IMDUR) 30 MG 24 hr tablet Take 30 mg by mouth 2 (two) times daily.    . metFORMIN (GLUCOPHAGE) 1000 MG tablet Take 1,000 mg by mouth 2 (two) times daily with a meal.   . nitroGLYCERIN (NITROSTAT) 0.4 MG SL  tablet Place 1 tablet (0.4 mg total) under the tongue every 5 (five) minutes as needed for chest pain.  . pioglitazone (ACTOS) 30 MG tablet Take 30 mg by mouth daily.    . promethazine (PHENERGAN) 25 MG tablet Take 25 mg by mouth every 6 (six) hours as needed.    No facility-administered encounter medications on file as of 06/29/2015.    Past Medical History  Diagnosis Date  . Unstable angina (HCC)     resolved. a. negative myocardial infarction  . DM2 (diabetes mellitus, type 2) (HCC)     stable  . Back pain     hx  . HLD (hyperlipidemia)   . HTN (hypertension)   . GERD (gastroesophageal reflux disease)   . Gout     Past Surgical History  Procedure Laterality Date  . Coronary artery bypass graft  2004    Social History  reports that he has quit smoking. His smoking use included Cigarettes. He has a 4 pack-year smoking history. His smokeless tobacco use includes Chew. He reports that he does not drink alcohol or use illicit drugs.  Family History Family history is unknown by patient.       Review of Systems  Constitutional: Negative.   Eyes: Negative.   Respiratory: Negative.   Cardiovascular: Negative.   Gastrointestinal: Negative.   Musculoskeletal: Positive  for back pain and arthralgias.  Skin: Negative.   Allergic/Immunologic: Negative.   Neurological: Negative.   Hematological: Negative.   Psychiatric/Behavioral: Negative.   All other systems reviewed and are negative.   BP 145/74 mmHg  Pulse 68  Ht  (1.727 m)  Wt 182 lb (82.555 kg)  BMI 27.68 kg/m2  Physical Exam  Constitutional: He is oriented to person, place, and time. He appears well-developed and well-nourished.  HENT:  Head: Normocephalic.  Nose: Nose normal.  Mouth/Throat: Oropharynx is clear and moist.  Eyes: Conjunctivae are normal. Pupils are equal, round, and reactive to light.  Neck: Normal range of motion. Neck supple. No JVD present.  Cardiovascular: Normal rate, regular rhythm,  S1 normal, S2 normal and intact distal pulses.  Exam reveals no gallop and no friction rub.   Murmur heard.  Systolic murmur is present with a grade of 1/6  Pulmonary/Chest: Effort normal and breath sounds normal. No respiratory distress. He has no wheezes. He has no rales. He exhibits no tenderness.  Abdominal: Soft. Bowel sounds are normal. He exhibits no distension. There is no tenderness.  Musculoskeletal: Normal range of motion. He exhibits no edema or tenderness.  Lymphadenopathy:    He has no cervical adenopathy.  Neurological: He is alert and oriented to person, place, and time. Coordination normal.  Skin: Skin is warm and dry. No rash noted. No erythema.  Psychiatric: He has a normal mood and affect. His behavior is normal. Judgment and thought content normal.      Assessment and Plan   Nursing note and vitals reviewed.

## 2015-06-29 NOTE — Assessment & Plan Note (Signed)
Blood pressure is well controlled on today's visit. No changes made to the medications. 

## 2015-06-29 NOTE — Patient Instructions (Signed)
You are doing well. No medication changes were made.  Please check the price of the caduet (amlodipine/atorvastatin pill) Expensive?  Please call us if you have new issues that need to be addressed before your next appt.  Your physician wants you to follow-up in: 6 months.  You will receive a reminder letter in the mail two months in advance. If you don't receive a letter, please call our office to schedule the follow-up appointment.

## 2015-06-29 NOTE — Assessment & Plan Note (Signed)
We have encouraged continued exercise, careful diet management in an effort to lose weight. 

## 2015-06-29 NOTE — Assessment & Plan Note (Signed)
Suggested he check price of his current medication If expensive, could change into a individual pill rather than combination pill Lab work has been requested from primary care

## 2015-06-29 NOTE — Assessment & Plan Note (Signed)
No symptoms concerning for angina. No medication changes made

## 2015-11-29 ENCOUNTER — Ambulatory Visit: Payer: Self-pay | Admitting: Physician Assistant

## 2015-12-05 ENCOUNTER — Other Ambulatory Visit: Payer: Self-pay | Admitting: Cardiovascular Disease

## 2015-12-12 ENCOUNTER — Ambulatory Visit (INDEPENDENT_AMBULATORY_CARE_PROVIDER_SITE_OTHER): Payer: Medicare Other | Admitting: Physician Assistant

## 2015-12-12 ENCOUNTER — Encounter: Payer: Self-pay | Admitting: Physician Assistant

## 2015-12-12 VITALS — BP 150/70 | HR 64 | Temp 97.9°F | Resp 16 | Ht 68.0 in | Wt 179.6 lb

## 2015-12-12 DIAGNOSIS — Z7689 Persons encountering health services in other specified circumstances: Secondary | ICD-10-CM

## 2015-12-12 DIAGNOSIS — Z7189 Other specified counseling: Secondary | ICD-10-CM | POA: Diagnosis not present

## 2015-12-12 DIAGNOSIS — M109 Gout, unspecified: Secondary | ICD-10-CM | POA: Diagnosis not present

## 2015-12-12 DIAGNOSIS — E118 Type 2 diabetes mellitus with unspecified complications: Secondary | ICD-10-CM | POA: Diagnosis not present

## 2015-12-12 DIAGNOSIS — I10 Essential (primary) hypertension: Secondary | ICD-10-CM

## 2015-12-12 NOTE — Patient Instructions (Signed)
Diabetes Mellitus and Food It is important for you to manage your blood sugar (glucose) level. Your blood glucose level can be greatly affected by what you eat. Eating healthier foods in the appropriate amounts throughout the day at about the same time each day will help you control your blood glucose level. It can also help slow or prevent worsening of your diabetes mellitus. Healthy eating may even help you improve the level of your blood pressure and reach or maintain a healthy weight.  General recommendations for healthful eating and cooking habits include:  Eating meals and snacks regularly. Avoid going long periods of time without eating to lose weight.  Eating a diet that consists mainly of plant-based foods, such as fruits, vegetables, nuts, legumes, and whole grains.  Using low-heat cooking methods, such as baking, instead of high-heat cooking methods, such as deep frying. Work with your dietitian to make sure you understand how to use the Nutrition Facts information on food labels. HOW CAN FOOD AFFECT ME? Carbohydrates Carbohydrates affect your blood glucose level more than any other type of food. Your dietitian will help you determine how many carbohydrates to eat at each meal and teach you how to count carbohydrates. Counting carbohydrates is important to keep your blood glucose at a healthy level, especially if you are using insulin or taking certain medicines for diabetes mellitus. Alcohol Alcohol can cause sudden decreases in blood glucose (hypoglycemia), especially if you use insulin or take certain medicines for diabetes mellitus. Hypoglycemia can be a life-threatening condition. Symptoms of hypoglycemia (sleepiness, dizziness, and disorientation) are similar to symptoms of having too much alcohol.  If your health care provider has given you approval to drink alcohol, do so in moderation and use the following guidelines:  Women should not have more than one drink per day, and men  should not have more than two drinks per day. One drink is equal to:  12 oz of beer.  5 oz of wine.  1 oz of hard liquor.  Do not drink on an empty stomach.  Keep yourself hydrated. Have water, diet soda, or unsweetened iced tea.  Regular soda, juice, and other mixers might contain a lot of carbohydrates and should be counted. WHAT FOODS ARE NOT RECOMMENDED? As you make food choices, it is important to remember that all foods are not the same. Some foods have fewer nutrients per serving than other foods, even though they might have the same number of calories or carbohydrates. It is difficult to get your body what it needs when you eat foods with fewer nutrients. Examples of foods that you should avoid that are high in calories and carbohydrates but low in nutrients include:  Trans fats (most processed foods list trans fats on the Nutrition Facts label).  Regular soda.  Juice.  Candy.  Sweets, such as cake, pie, doughnuts, and cookies.  Fried foods. WHAT FOODS CAN I EAT? Eat nutrient-rich foods, which will nourish your body and keep you healthy. The food you should eat also will depend on several factors, including:  The calories you need.  The medicines you take.  Your weight.  Your blood glucose level.  Your blood pressure level.  Your cholesterol level. You should eat a variety of foods, including:  Protein.  Lean cuts of meat.  Proteins low in saturated fats, such as fish, egg whites, and beans. Avoid processed meats.  Fruits and vegetables.  Fruits and vegetables that may help control blood glucose levels, such as apples, mangoes, and   yams.  Dairy products.  Choose fat-free or low-fat dairy products, such as milk, yogurt, and cheese.  Grains, bread, pasta, and rice.  Choose whole grain products, such as multigrain bread, whole oats, and brown rice. These foods may help control blood pressure.  Fats.  Foods containing healthful fats, such as nuts,  avocado, olive oil, canola oil, and fish. DOES EVERYONE WITH DIABETES MELLITUS HAVE THE SAME MEAL PLAN? Because every person with diabetes mellitus is different, there is not one meal plan that works for everyone. It is very important that you meet with a dietitian who will help you create a meal plan that is just right for you.   This information is not intended to replace advice given to you by your health care provider. Make sure you discuss any questions you have with your health care provider.   Document Released: 04/10/2005 Document Revised: 08/04/2014 Document Reviewed: 06/10/2013 Elsevier Interactive Patient Education 2016 [employer] Inc[employer]  

## 2015-12-12 NOTE — Progress Notes (Signed)
Patient: [name] Lowrey Sr. Male    DOB: May 22, 1941   75 y.o.   MRN: [id number] Visit Date: 12/12/2015  Today's Provider: Margaretann Loveless, [name]   Chief Complaint  Patient presents with  . New Patient (Initial Visit)  . Diabetes   Subjective:    HPI  [name] is a 75 yr old male that is here today for establishing care and to follow-up diabetes. He was previously seen by Dr. Glenis Smoker but wants to establish here because his wife is also seen here at this office.   He has a CAD, had a bypass surgery in 2004. He is followed by Dr. Mariah Milling. He was most recently seen by Dr. Mariah Milling in 06/2015. He is followed every 6 months. Dr. Mariah Milling prescribes his blood pressure medications, nitroglycerin and digoxin.   He also has history of gout and is controlled on allopurinol currently.  He also has chronic back issues. He is followed by Dr. Thyra Breed in [place]. He has a pain contract with him. He has percocet 5-325mg  that he takes as needed. He also is given injections in his back.    Diabetes Mellitus Type II, Follow-up:   Lab Results  Component Value Date   HGBA1C 7.3 02/28/2008    Last seen for diabetes 1 year ago. He reports he follow-up every three month for everything, but for his Diabetes he thinks is about a year. Management since then includes n/a. He reports excellent compliance with treatment. He is not having side effects.  Current symptoms include polydipsia and polyuria and have been stable. He reports it depends on what patient is doing or how much water he has drank.  Home blood sugar records: fasting range: 100-130 He reports that this morning it was 107.  Episodes of hypoglycemia? yes - The last one was two weeks. His sugar was at 72.   Current Insulin Regimen: He is on Metformin and Onglyza. Most Recent Eye Exam: 1 1/2 yr.-previously in [place] but is going to have done at [facility] here in town. Weight trend: fluctuating a bit Prior  visit with dietician: no Current diet: in general, a "healthy" diet   Current exercise: gardening and yard work He reports doing something everyday. He keeps himself very busy. He also works part-time for the [employer] department and is primary caregiver for his wife.  Pertinent Labs:    Component Value Date/Time   CHOL 115 02/28/2008   TRIG 136 02/28/2008   HDL 35 02/28/2008   LDLCALC 53 02/28/2008    Wt Readings from Last 3 Encounters:  12/12/15 179 lb 9.6 oz (81.466 kg)  06/29/15 182 lb (82.555 kg)  10/23/14 179 lb 8 oz (81.421 kg)    ------------------------------------------------------------------------      Allergies  Allergen Reactions  . Ramipril    Previous Medications   ALLOPURINOL (ZYLOPRIM) 300 MG TABLET    Take 300 mg by mouth daily.     AMLODIPINE-ATORVASTATIN (CADUET) 5-20 MG TABLET    TAKE 1 TABLET BY MOUTH ONCE A DAY   CARVEDILOL (COREG) 6.25 MG TABLET    Take 6.25 mg by mouth 2 (two) times daily. Reported on 12/12/2015   DIGOXIN (LANOXIN) 0.125 MG TABLET    Take 125 mcg by mouth daily.     FERROUS SULFATE ([facility] IRON) 325 (65 FE) MG TABLET    Take 325 mg by mouth daily with breakfast.   GLIPIZIDE (GLUCOTROL XL) 10 MG 24 HR TABLET  ISOSORBIDE MONONITRATE (IMDUR) 30 MG 24 HR TABLET    Take 30 mg by mouth 2 (two) times daily. Reported on 12/12/2015   LISINOPRIL (PRINIVIL,ZESTRIL) 20 MG TABLET    Take 20 mg by mouth daily.   MELOXICAM (MOBIC) 7.5 MG TABLET       METFORMIN (GLUCOPHAGE) 1000 MG TABLET    Take 1,000 mg by mouth 2 (two) times daily with a meal.    NITROGLYCERIN (NITROSTAT) 0.4 MG SL TABLET    Place 1 tablet (0.4 mg total) under the tongue every 5 (five) minutes as needed for chest pain.   ONGLYZA 5 MG TABS TABLET       OXYCODONE-ACETAMINOPHEN (PERCOCET/ROXICET) 5-325 MG TABLET    Reported on 12/12/2015   PIOGLITAZONE (ACTOS) 30 MG TABLET    Take 30 mg by mouth daily. Reported on 12/12/2015    Review of Systems  Constitutional: Negative.     HENT: Negative.   Eyes: Negative.   Respiratory: Negative.   Cardiovascular: Negative.   Gastrointestinal: Negative.   Endocrine: Positive for polydipsia and polyuria.  Genitourinary: Negative.   Musculoskeletal: Positive for back pain.  Skin: Negative.   Allergic/Immunologic: Negative.   Neurological: Negative.   Hematological: Bruises/bleeds easily.  Psychiatric/Behavioral: Negative.     Social History  Substance Use Topics  . Smoking status: Former Smoker -- 1.00 packs/day for 4 years    Types: Cigarettes  . Smokeless tobacco: Current User    Types: Chew  . Alcohol Use: No   Objective:   BP 150/70 mmHg  Pulse 64  Temp(Src) 97.9 F (36.6 C) (Oral)  Resp 16  Ht  (1.727 m)  Wt 179 lb 9.6 oz (81.466 kg)  BMI 27.31 kg/m2  Physical Exam  Constitutional: He is oriented to person, place, and time. He appears well-developed and well-nourished.  HENT:  Head: Normocephalic and atraumatic.  Right Ear: Tympanic membrane, external ear and ear canal normal.  Left Ear: Tympanic membrane, external ear and ear canal normal.  Nose: Nose normal.  Mouth/Throat: Uvula is midline, oropharynx is clear and moist and mucous membranes are normal. No oropharyngeal exudate, posterior oropharyngeal edema or posterior oropharyngeal erythema.  Eyes: Conjunctivae and EOM are normal. Pupils are equal, round, and reactive to light. Right eye exhibits no discharge.  Left pupil is larger than right  Neck: Normal range of motion. Neck supple. Carotid bruit is not present. No tracheal deviation present. No thyromegaly present.  Cardiovascular: Normal rate, regular rhythm, normal heart sounds and intact distal pulses.   No murmur heard. Pulmonary/Chest: Effort normal and breath sounds normal. No respiratory distress. He has no wheezes. He has no rales. He exhibits no tenderness.  Abdominal: Soft. He exhibits no distension and no mass. There is no tenderness. There is no rebound and no guarding.   Musculoskeletal: Normal range of motion. He exhibits no edema or tenderness.  Lymphadenopathy:    He has no cervical adenopathy.  Neurological: He is alert and oriented to person, place, and time. He has normal reflexes. No cranial nerve deficit. He exhibits normal muscle tone. Coordination normal.  Skin: Skin is warm and dry. No rash noted. No erythema.  Psychiatric: He has a normal mood and affect. His behavior is normal. Judgment and thought content normal.        Assessment & Plan:     1. Establishing care with new doctor, encounter for Establishing from previously being seen by Dr. Glenis Smoker. I will see him back in 3 months for CPE medicare  wellness.   2. Type 2 diabetes mellitus with complication, without long-term current use of insulin (HCC) Will check labs as below and f/u pending lab results. He is also in need of test strips, however, he could not recall name of mail in pharmacy to receive them. He is to call once he knows this and we will prescribe new test strips.  - Hemoglobin A1c - Microalbumin / creatinine urine ratio - CBC - Basic metabolic panel  3. Gout, unspecified Stable on allopurinol. Continue current medical treatment plan.  4. Essential hypertension Denies angina with physical activity. Carries nitro around with him in his pocket just in case. Takes daily ASA. Stable on lisinopril and caduet. Followed by Dr. Mariah MillingGollan.       Margaretann LovelessJennifer M Latesha Chesney, [name]  [facility]

## 2015-12-13 LAB — BASIC METABOLIC PANEL
BUN / CREAT RATIO: 12 (ref 10–24)
BUN: 12 mg/dL (ref 8–27)
CHLORIDE: 97 mmol/L (ref 96–106)
CO2: 23 mmol/L (ref 18–29)
Calcium: 9.5 mg/dL (ref 8.6–10.2)
Creatinine, Ser: 1.03 mg/dL (ref 0.76–1.27)
GFR calc non Af Amer: 71 mL/min/{1.73_m2} (ref >59)
GFR, EST AFRICAN AMERICAN: 82 mL/min/{1.73_m2} (ref >59)
GLUCOSE: 204 mg/dL — AB (ref 65–99)
Potassium: 5 mmol/L (ref 3.5–5.2)
SODIUM: 136 mmol/L (ref 134–144)

## 2015-12-13 LAB — CBC
Hematocrit: 39.6 % (ref 37.5–51.0)
Hemoglobin: 13 g/dL (ref 12.6–17.7)
MCH: 29.3 pg (ref 26.6–33.0)
MCHC: 32.8 g/dL (ref 31.5–35.7)
MCV: 89 fL (ref 79–97)
PLATELETS: 260 10*3/uL (ref 150–379)
RBC: 4.43 x10E6/uL (ref 4.14–5.80)
RDW: 13.4 % (ref 12.3–15.4)
WBC: 8.7 10*3/uL (ref 3.4–10.8)

## 2015-12-13 LAB — HEMOGLOBIN A1C
ESTIMATED AVERAGE GLUCOSE: 180 mg/dL
HEMOGLOBIN A1C: 7.9 % — AB (ref 4.8–5.6)

## 2015-12-13 LAB — MICROALBUMIN / CREATININE URINE RATIO
CREATININE, UR: 106.6 mg/dL
MICROALB/CREAT RATIO: 3.3 mg/g creat (ref 0.0–30.0)
MICROALBUM., U, RANDOM: 3.5 ug/mL

## 2015-12-13 NOTE — Progress Notes (Signed)
Advised  ED 

## 2015-12-21 ENCOUNTER — Other Ambulatory Visit: Payer: Self-pay | Admitting: Physician Assistant

## 2015-12-21 DIAGNOSIS — I482 Chronic atrial fibrillation, unspecified: Secondary | ICD-10-CM

## 2015-12-21 MED ORDER — DIGOXIN 125 MCG PO TABS: 125.0000 ug | ORAL_TABLET | Freq: Every day | ORAL | Status: DC

## 2016-01-16 ENCOUNTER — Other Ambulatory Visit: Payer: Self-pay | Admitting: Physician Assistant

## 2016-01-16 DIAGNOSIS — E119 Type 2 diabetes mellitus without complications: Secondary | ICD-10-CM

## 2016-03-10 ENCOUNTER — Other Ambulatory Visit: Payer: Self-pay

## 2016-03-10 DIAGNOSIS — M2550 Pain in unspecified joint: Secondary | ICD-10-CM

## 2016-03-10 DIAGNOSIS — I10 Essential (primary) hypertension: Secondary | ICD-10-CM

## 2016-03-10 MED ORDER — LISINOPRIL 20 MG PO TABS: 20.0000 mg | ORAL_TABLET | Freq: Every day | ORAL | 1 refills | Status: DC

## 2016-03-10 MED ORDER — MELOXICAM 7.5 MG PO TABS: 7.5000 mg | ORAL_TABLET | Freq: Two times a day (BID) | ORAL | 3 refills | Status: DC

## 2016-03-13 ENCOUNTER — Ambulatory Visit (INDEPENDENT_AMBULATORY_CARE_PROVIDER_SITE_OTHER): Payer: Medicare Other | Admitting: Physician Assistant

## 2016-03-13 ENCOUNTER — Encounter: Payer: Self-pay | Admitting: Physician Assistant

## 2016-03-13 VITALS — BP 138/68 | HR 67 | Temp 97.8°F | Resp 16 | Ht 69.0 in | Wt 181.6 lb

## 2016-03-13 DIAGNOSIS — M1A9XX0 Chronic gout, unspecified, without tophus (tophi): Secondary | ICD-10-CM | POA: Diagnosis not present

## 2016-03-13 DIAGNOSIS — E118 Type 2 diabetes mellitus with unspecified complications: Secondary | ICD-10-CM

## 2016-03-13 DIAGNOSIS — I10 Essential (primary) hypertension: Secondary | ICD-10-CM

## 2016-03-13 DIAGNOSIS — Z125 Encounter for screening for malignant neoplasm of prostate: Secondary | ICD-10-CM

## 2016-03-13 DIAGNOSIS — Z23 Encounter for immunization: Secondary | ICD-10-CM

## 2016-03-13 DIAGNOSIS — Z0000 Encounter for general adult medical examination without abnormal findings: Secondary | ICD-10-CM | POA: Diagnosis not present

## 2016-03-13 DIAGNOSIS — E785 Hyperlipidemia, unspecified: Secondary | ICD-10-CM | POA: Diagnosis not present

## 2016-03-13 MED ORDER — GLUCOSE BLOOD VI STRP: ORAL_STRIP | 5 refills | Status: DC

## 2016-03-13 MED ORDER — ALLOPURINOL 300 MG PO TABS: 300.0000 mg | ORAL_TABLET | Freq: Every day | ORAL | 3 refills | Status: DC

## 2016-03-13 NOTE — Progress Notes (Signed)
Patient: [name] Brubacher., Male    DOB: [birth year], 75 y.o.   MRN: [id number] Visit Date: 03/13/2016  Today's Provider: Margaretann Loveless, [name]   Chief Complaint  Patient presents with  . Medicare Wellness   Subjective:    Annual wellness visit [name] Weckwerth Sr. is a 75 y.o. male. He feels well. He reports exercising daily. He reports he is sleeping well (average 7-8 hours per night)  -----------------------------------------------------------   Review of Systems  Constitutional: Negative.   HENT: Negative.   Eyes: Negative.   Respiratory: Negative.   Cardiovascular: Negative.   Gastrointestinal: Negative.   Endocrine: Negative.   Genitourinary: Negative.   Musculoskeletal: Positive for back pain.  Skin: Negative.   Allergic/Immunologic: Negative.   Neurological: Negative.   Hematological: Negative.   Psychiatric/Behavioral: Negative.     Social History   Social History  . Marital status: Married    Spouse name: N/A  . Number of children: N/A  . Years of education: N/A   Occupational History  . Not on file.   Social History Main Topics  . Smoking status: Former Smoker    Packs/day: 1.00    Years: 4.00    Types: Cigarettes  . Smokeless tobacco: Current User    Types: Chew  . Alcohol use No  . Drug use: No  . Sexual activity: Not on file   Other Topics Concern  . Not on file   Social History Narrative  . No narrative on file    Past Medical History:  Diagnosis Date  . Arthritis   . Back pain    hx  . Colon polyps   . DM2 (diabetes mellitus, type 2) (HCC)    stable  . GERD (gastroesophageal reflux disease)   . Glaucoma   . Gout   . HLD (hyperlipidemia)   . HTN (hypertension)   . Unstable angina (HCC)    resolved. a. negative myocardial infarction     Patient Active Problem List   Diagnosis Date Noted  . Abnormal EKG 11/26/2012  . Diabetes mellitus type 2 with complications (HCC) 11/07/2009  .  Hyperlipidemia 11/07/2009  . GOUT, UNSPECIFIED 11/07/2009  . Essential hypertension 11/07/2009  . UNSTABLE ANGINA 11/07/2009  . 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 GERD 11/07/2009  . BACK PAIN, CHRONIC 11/07/2009    Past Surgical History:  Procedure Laterality Date  . CORONARY ARTERY BYPASS GRAFT  2004    His Family history is unknown by patient.    Current Meds  Medication Sig  . amLODipine-atorvastatin (CADUET) 5-20 MG tablet TAKE 1 TABLET BY MOUTH ONCE A DAY  . digoxin (LANOXIN) 0.125 MG tablet Take 1 tablet (125 mcg total) by mouth daily.  . ferrous sulfate ([employer] IRON) 325 (65 FE) MG tablet Take 325 mg by mouth daily with breakfast.  . glipiZIDE (GLUCOTROL XL) 10 MG 24 hr tablet   . lisinopril (PRINIVIL,ZESTRIL) 20 MG tablet Take 1 tablet (20 mg total) by mouth daily.  . meloxicam (MOBIC) 7.5 MG tablet Take 1 tablet (7.5 mg total) by mouth 2 (two) times daily.  . metFORMIN (GLUCOPHAGE) 1000 MG tablet Take 1,000 mg by mouth 2 (two) times daily with a meal.   . nitroGLYCERIN (NITROSTAT) 0.4 MG SL tablet Place 1 tablet (0.4 mg total) under the tongue every 5 (five) minutes as needed for chest pain.  . ONGLYZA 5 MG TABS tablet TAKE 1 TABLET BY MOUTH ONCE A DAY  Patient Care Team: Margaretann LovelessJennifer M Burnette, [name] as PCP - General (Family Medicine)    Objective:   Vitals: BP 138/68 (BP Location: Right Arm, Patient Position: Sitting, Cuff Size: Normal)   Pulse 67   Temp 97.8 F (36.6 C) (Oral)   Resp 16   Ht 5\' 9"  (1.753 m)   Wt 181 lb 9.6 oz (82.4 kg)   SpO2 97%   BMI 26.82 kg/m   Physical Exam  Constitutional: He is oriented to person, place, and time. He appears well-developed and well-nourished.  HENT:  Head: Normocephalic and atraumatic.  Right Ear: Tympanic membrane, external ear and ear canal normal.  Left Ear: Tympanic membrane, external ear and ear canal normal.  Nose: Nose normal.  Mouth/Throat: Uvula is midline, oropharynx is clear and  moist and mucous membranes are normal.  Eyes: Conjunctivae and EOM are normal. Pupils are equal, round, and reactive to light. Right eye exhibits no discharge.  Neck: Normal range of motion. Neck supple. Carotid bruit is not present. No tracheal deviation present. No thyromegaly present.  Cardiovascular: Normal rate, regular rhythm, normal heart sounds and intact distal pulses.   No murmur heard. Pulmonary/Chest: Effort normal and breath sounds normal. No respiratory distress. He has no wheezes. He has no rales. He exhibits no tenderness.  Abdominal: Soft. He exhibits no distension and no mass. There is no tenderness. There is no rebound and no guarding.  Musculoskeletal: Normal range of motion. He exhibits no edema or tenderness.  Lymphadenopathy:    He has no cervical adenopathy.  Neurological: He is alert and oriented to person, place, and time. He has normal reflexes. No cranial nerve deficit. He exhibits normal muscle tone. Coordination normal.  Skin: Skin is warm and dry. No rash noted. No erythema.  Psychiatric: He has a normal mood and affect. His behavior is normal. Judgment and thought content normal.    Activities of Daily Living In your present state of health, do you have any difficulty performing the following activities: 03/13/2016  Hearing? N  Vision? Y  Difficulty concentrating or making decisions? Y  Walking or climbing stairs? Y  Dressing or bathing? N  Doing errands, shopping? N  Some recent data might be hidden    Fall Risk Assessment Fall Risk  03/13/2016  Falls in the past year? No     Depression Screen PHQ 2/9 Scores 03/13/2016  PHQ - 2 Score 0    Cognitive Testing - 6-CIT  Correct? Score   What year is it? yes 0 0 or 4  What month is it? yes 0 0 or 3  Memorize:    [name] ParkinsJohn,  [place],  [birth year],  [address],  [place],      What time is it? (within 1 hour) yes 0 0 or 3  Count backwards from 20 yes 0 0, 2, or 4  Name the months of the year yes 0 0, 2, or 4  Repeat  name & address above no 8 0, 2, 4, 6, 8, or 10       TOTAL SCORE  8/28   Interpretation:  Abnormal- 8  Normal (0-7) Abnormal (8-28)   Audit-C Alcohol Use Screening  Question Answer Points  How often do you have alcoholic drink? never 0  On days you do drink alcohol, how many drinks do you typically consume? 0 0  How oftey will you drink 6 or more in a total? never 0  Total Score:  0   A score of 3 or more  in women, and 4 or more in men indicates increased risk for alcohol abuse, EXCEPT if all of the points are from question 1.     Assessment & Plan:     Annual Wellness Visit  Reviewed patient's Family Medical History Reviewed and updated list of patient's medical providers Assessment of cognitive impairment was done Assessed patient's functional ability Established a written schedule for health screening services Health Risk Assessent Completed and Reviewed  Exercise Activities and Dietary recommendations Goals    None       There is no immunization history on file for this patient.  Health Maintenance  Topic Date Due  . FOOT EXAM  11/20/1951  . OPHTHALMOLOGY EXAM  11/20/1951  . TETANUS/TDAP  11/19/1960  . COLONOSCOPY  11/20/1991  . ZOSTAVAX  11/19/2001  . PNA vac Low Risk Adult (1 of 2 - PCV13) 11/20/2006  . INFLUENZA VACCINE  02/26/2016  . HEMOGLOBIN A1C  06/13/2016      Discussed health benefits of physical activity, and encouraged him to engage in regular exercise appropriate for his age and condition.    1. Medicare annual wellness visit, subsequent Normal physical exam today.  2. Essential hypertension Will check labs as below and f/u pending results. - CBC with Differential - Comprehensive Metabolic Panel (CMET) - TSH  3. Type 2 diabetes mellitus with complication, without long-term current use of insulin (HCC) Stable. Diagnosis pulled for medication refill. Continue current medical treatment plan. Will check labs as below and f/u pending  results. - HgB A1c - glucose blood (ONE TOUCH TEST STRIPS) test strip; Check blood sugar q a.m. fasting  Dispense: 100 each; Refill: 5  4. Hyperlipidemia Will check labs as below and f/u pending results. - Lipid Profile  5. Prostate cancer screening Will check labs as below and f/u pending results. - PSA  6. Need for pneumococcal vaccination Prevnar 13 given today without complications. - Pneumococcal conjugate vaccine 13-valent  7. Need for Zostavax administration Was unable to give in office today due to not knowing out of pocket cost to patient (Medicare website to check was down). Written Rx for zostavax and Tdap were given to patient for him to get at his pharmacy. They are to fax records and we will update heath maintenance once received.  - Varicella-zoster vaccine subcutaneous  8. Need for Tdap vaccination See above medical treatment plan for #7. - Tdap vaccine greater than or equal to 7yo IM  9. Chronic gout without tophus, unspecified cause, unspecified site Stable. Diagnosis pulled for medication refill. Continue current medical treatment plan. - allopurinol (ZYLOPRIM) 300 MG tablet; Take 1 tablet (300 mg total) by mouth daily.  Dispense: 90 tablet; Refill: 3  ------------------------------------------------------------------------------------------------------------    Margaretann LovelessJennifer M Burnette, [name]  [facility]

## 2016-03-13 NOTE — Patient Instructions (Signed)

## 2016-03-14 ENCOUNTER — Telehealth: Payer: Self-pay

## 2016-03-14 LAB — CBC WITH DIFFERENTIAL/PLATELET
BASOS ABS: 0.1 10*3/uL (ref 0.0–0.2)
Basos: 1 %
EOS (ABSOLUTE): 0.2 10*3/uL (ref 0.0–0.4)
Eos: 3 %
Hematocrit: 39.5 % (ref 37.5–51.0)
Hemoglobin: 12.8 g/dL (ref 12.6–17.7)
IMMATURE GRANULOCYTES: 0 %
Immature Grans (Abs): 0 10*3/uL (ref 0.0–0.1)
LYMPHS ABS: 1.8 10*3/uL (ref 0.7–3.1)
LYMPHS: 25 %
MCH: 29.4 pg (ref 26.6–33.0)
MCHC: 32.4 g/dL (ref 31.5–35.7)
MCV: 91 fL (ref 79–97)
MONOS ABS: 0.7 10*3/uL (ref 0.1–0.9)
Monocytes: 10 %
NEUTROS ABS: 4.5 10*3/uL (ref 1.4–7.0)
Neutrophils: 61 %
PLATELETS: 226 10*3/uL (ref 150–379)
RBC: 4.35 x10E6/uL (ref 4.14–5.80)
RDW: 12.8 % (ref 12.3–15.4)
WBC: 7.3 10*3/uL (ref 3.4–10.8)

## 2016-03-14 LAB — COMPREHENSIVE METABOLIC PANEL
A/G RATIO: 2.2 (ref 1.2–2.2)
ALT: 19 IU/L (ref 0–44)
AST: 15 IU/L (ref 0–40)
Albumin: 4.2 g/dL (ref 3.5–4.8)
Alkaline Phosphatase: 52 IU/L (ref 39–117)
BUN/Creatinine Ratio: 11 (ref 10–24)
BUN: 11 mg/dL (ref 8–27)
Bilirubin Total: 0.5 mg/dL (ref 0.0–1.2)
CALCIUM: 9.8 mg/dL (ref 8.6–10.2)
CHLORIDE: 96 mmol/L (ref 96–106)
CO2: 21 mmol/L (ref 18–29)
Creatinine, Ser: 1.03 mg/dL (ref 0.76–1.27)
GFR calc Af Amer: 82 mL/min/{1.73_m2} (ref >59)
GFR, EST NON AFRICAN AMERICAN: 71 mL/min/{1.73_m2} (ref >59)
Globulin, Total: 1.9 g/dL (ref 1.5–4.5)
Glucose: 201 mg/dL — ABNORMAL HIGH (ref 65–99)
POTASSIUM: 4.6 mmol/L (ref 3.5–5.2)
Sodium: 139 mmol/L (ref 134–144)
Total Protein: 6.1 g/dL (ref 6.0–8.5)

## 2016-03-14 LAB — LIPID PANEL
CHOLESTEROL TOTAL: 106 mg/dL (ref 100–199)
Chol/HDL Ratio: 3.1 ratio units (ref 0.0–5.0)
HDL: 34 mg/dL — ABNORMAL LOW (ref >39)
LDL CALC: 46 mg/dL (ref 0–99)
TRIGLYCERIDES: 129 mg/dL (ref 0–149)
VLDL Cholesterol Cal: 26 mg/dL (ref 5–40)

## 2016-03-14 LAB — TSH: TSH: 2.81 u[IU]/mL (ref 0.450–4.500)

## 2016-03-14 LAB — HEMOGLOBIN A1C
ESTIMATED AVERAGE GLUCOSE: 197 mg/dL
Hgb A1c MFr Bld: 8.5 % — ABNORMAL HIGH (ref 4.8–5.6)

## 2016-03-14 LAB — PSA: PROSTATE SPECIFIC AG, SERUM: 4.5 ng/mL — AB (ref 0.0–4.0)

## 2016-03-14 NOTE — Telephone Encounter (Signed)
LMTCB  Thanks,  -Joseline 

## 2016-03-14 NOTE — Telephone Encounter (Signed)
-----   Message from Margaretann LovelessJennifer M Burnette, [name] sent at 03/14/2016  1:01 PM EDT ----- HgBA1c worsened. Patient needs to return to discuss lab results and medication changes for diabetes.

## 2016-03-17 ENCOUNTER — Other Ambulatory Visit: Payer: Self-pay | Admitting: Physician Assistant

## 2016-03-17 NOTE — Telephone Encounter (Signed)
LMTCB  Thanks,  -Tatsuya Okray 

## 2016-03-21 NOTE — Telephone Encounter (Signed)
Patient advised as below. Patient verbalizes understanding and is in agreement with treatment plan. appt 04/03/16

## 2016-04-03 ENCOUNTER — Encounter: Payer: Self-pay | Admitting: Physician Assistant

## 2016-04-03 ENCOUNTER — Ambulatory Visit (INDEPENDENT_AMBULATORY_CARE_PROVIDER_SITE_OTHER): Payer: Medicare Other | Admitting: Physician Assistant

## 2016-04-03 VITALS — BP 144/78 | HR 78 | Temp 98.7°F | Resp 18 | Wt 181.0 lb

## 2016-04-03 DIAGNOSIS — E118 Type 2 diabetes mellitus with unspecified complications: Secondary | ICD-10-CM | POA: Diagnosis not present

## 2016-04-03 MED ORDER — EMPAGLIFLOZIN 25 MG PO TABS: 25.0000 mg | ORAL_TABLET | Freq: Every day | ORAL | 3 refills | Status: DC

## 2016-04-03 NOTE — Progress Notes (Signed)
Patient: [name] Brodowski Sr. Male    DOB: 1941-06-14   75 y.o.   MRN: [id number] Visit Date: 04/03/2016  Today's Provider: Margaretann Loveless, [name]   Chief Complaint  Patient presents with  . Diabetes    follow up    Subjective:    HPI Patient comes in today to discuss treatment options for his diabetes. Patient had labs drawn on 03/13/2016, and his HgbA1c was 8.5. This has been gradually increasing over the past year or so. He reports good compliance with medications. Current regimen is Metformin 1000mg  BID, Onglyza 5mg  daily and glipizide 10mg  daily. He reports having a fairly healthy diet. He is physically active around the home and at work.    Allergies  Allergen Reactions  . Ramipril      Current Outpatient Prescriptions:  .  allopurinol (ZYLOPRIM) 300 MG tablet, Take 1 tablet (300 mg total) by mouth daily., Disp: 90 tablet, Rfl: 3 .  amLODipine-atorvastatin (CADUET) 5-20 MG tablet, TAKE 1 TABLET BY MOUTH ONCE A DAY, Disp: 90 tablet, Rfl: 3 .  digoxin (LANOXIN) 0.125 MG tablet, Take 1 tablet (125 mcg total) by mouth daily., Disp: 90 tablet, Rfl: 3 .  ferrous sulfate ([employer] IRON) 325 (65 FE) MG tablet, Take 325 mg by mouth daily with breakfast., Disp: , Rfl:  .  glipiZIDE (GLUCOTROL XL) 10 MG 24 hr tablet, , Disp: , Rfl:  .  glucose blood (ONE TOUCH TEST STRIPS) test strip, Check blood sugar q a.m. fasting, Disp: 100 each, Rfl: 5 .  lisinopril (PRINIVIL,ZESTRIL) 20 MG tablet, Take 1 tablet (20 mg total) by mouth daily., Disp: 90 tablet, Rfl: 1 .  meloxicam (MOBIC) 7.5 MG tablet, Take 1 tablet (7.5 mg total) by mouth 2 (two) times daily., Disp: 60 tablet, Rfl: 3 .  metFORMIN (GLUCOPHAGE) 1000 MG tablet, Take 1,000 mg by mouth 2 (two) times daily with a meal. , Disp: , Rfl:  .  nitroGLYCERIN (NITROSTAT) 0.4 MG SL tablet, Place 1 tablet (0.4 mg total) under the tongue every 5 (five) minutes as needed for chest pain., Disp: 25 tablet, Rfl: 6 .  ONGLYZA 5 MG TABS tablet,  TAKE 1 TABLET BY MOUTH ONCE A DAY, Disp: 90 tablet, Rfl: 3 .  ZOSTAVAX 40981 UNT/0.65ML injection, TO BE ADMINISTERED BY THE PHARMACIST, Disp: 1 each, Rfl: 0  Review of Systems  Constitutional: Negative.   Respiratory: Negative.   Cardiovascular: Negative.   Gastrointestinal: Negative.   Endocrine: Negative.   Genitourinary: Negative.   Neurological: Negative.     Social History  Substance Use Topics  . Smoking status: Former Smoker    Packs/day: 1.00    Years: 4.00    Types: Cigarettes  . Smokeless tobacco: Current User    Types: Chew  . Alcohol use No   Objective:   BP (!) 144/78 (BP Location: Right Arm, Patient Position: Sitting, Cuff Size: Normal)   Pulse 78   Temp 98.7 F (37.1 C)   Resp 18   Wt 181 lb (82.1 kg)   BMI 26.73 kg/m   Physical Exam  Constitutional: He appears well-developed and well-nourished. No distress.  HENT:  Head: Normocephalic and atraumatic.  Neck: Normal range of motion. Neck supple. No JVD present. No tracheal deviation present. No thyromegaly present.  Cardiovascular: Normal rate, regular rhythm and normal heart sounds.  Exam reveals no gallop and no friction rub.   No murmur heard. Pulmonary/Chest: Effort normal and breath sounds normal. No respiratory distress.  He has no wheezes. He has no rales.  Musculoskeletal: He exhibits no edema.  Lymphadenopathy:    He has no cervical adenopathy.  Skin: He is not diaphoretic.  Vitals reviewed.     Assessment & Plan:      1. Type 2 diabetes mellitus with complication, without long-term current use of insulin (HCC) Will discontinue glipizide as he reports he has more times when he feels like his sugar gets too low. Will start Jardiance as below. Samples were given to patient to start. I will see him back in 1-2 months for a recheck of A1c.  - empagliflozin (JARDIANCE) 25 MG TABS tablet; Take 25 mg by mouth daily.  Dispense: 7 tablet; Refill: 3       Margaretann LovelessJennifer M Lakoda Mcanany, [name]  [facility]

## 2016-04-03 NOTE — Patient Instructions (Signed)
Empagliflozin oral tablets What is this medicine? EMPAGLIGLOZIN (EM pa gli FLOE zin) helps to treat type 2 diabetes. It helps to control blood sugar. Treatment is combined with diet and exercise. This medicine may be used for other purposes; ask your health care provider or pharmacist if you have questions. What should I tell my health care provider before I take this medicine? They need to know if you have any of these conditions: -dehydration -diabetic ketoacidosis -diet low in salt -eating less due to illness, surgery, dieting, or any other reason -having surgery -high cholesterol -high levels of potassium in the blood -history of pancreatitis or pancreas problems -history of yeast infection of the penis or vagina -if you often drink alcohol -infections in the bladder, kidneys, or urinary tract -kidney disease -liver disease -low blood pressure -on hemodialysis -problems urinating -type 1 diabetes -uncircumcised male -an unusual or allergic reaction to empagliflozin, other medicines, foods, dyes, or preservatives -pregnant or trying to get pregnant -breast-feeding How should I use this medicine? Take this medicine by mouth with a glass of water. Follow the directions on the prescription label. Take it in the morning, with or without food. Take your dose at the same time each day. Do not take more often than directed. Do not stop taking except on your doctor's advice. Talk to your pediatrician regarding the use of this medicine in children. Special care may be needed. Overdosage: If you think you have taken too much of this medicine contact a poison control center or emergency room at once. NOTE: This medicine is only for you. Do not share this medicine with others. What if I miss a dose? If you miss a dose, take it as soon as you can. If it is almost time for your next dose, take only that dose. Do not take double or extra doses. What may interact with this medicine? Do not take  this medicine with any of the following medications: -gatifloxacin This medicine may also interact with the following medications: -alcohol -certain medicines for blood pressure, heart disease -diuretics This list may not describe all possible interactions. Give your health care provider a list of all the medicines, herbs, non-prescription drugs, or dietary supplements you use. Also tell them if you smoke, drink alcohol, or use illegal drugs. Some items may interact with your medicine. What should I watch for while using this medicine? Visit your doctor or health care professional for regular checks on your progress. This medicine can cause a serious condition in which there is too much acid in the blood. If you develop nausea, vomiting, stomach pain, unusual tiredness, or breathing problems, stop taking this medicine and call your doctor right away. If possible, use a ketone dipstick to check for ketones in your urine. A test called the HbA1C (A1C) will be monitored. This is a simple blood test. It measures your blood sugar control over the last 2 to 3 months. You will receive this test every 3 to 6 months. Learn how to check your blood sugar. Learn the symptoms of low and high blood sugar and how to manage them. Always carry a quick-source of sugar with you in case you have symptoms of low blood sugar. Examples include hard sugar candy or glucose tablets. Make sure others know that you can choke if you eat or drink when you develop serious symptoms of low blood sugar, such as seizures or unconsciousness. They must get medical help at once. Tell your doctor or health care professional if you   have high blood sugar. You might need to change the dose of your medicine. If you are sick or exercising more than usual, you might need to change the dose of your medicine. Do not skip meals. Ask your doctor or health care professional if you should avoid alcohol. Many nonprescription cough and cold products  contain sugar or alcohol. These can affect blood sugar. Wear a medical ID bracelet or chain, and carry a card that describes your disease and details of your medicine and dosage times. What side effects may I notice from receiving this medicine? Side effects that you should report to your doctor or health care professional as soon as possible: -allergic reactions like skin rash, itching or hives, swelling of the face, lips, or tongue -breathing problems -dizziness -fast or irregular heartbeat -feeling faint or lightheaded, falls -muscle weakness -nausea, vomiting, unusual stomach upset or pain -signs and symptoms of low blood sugar such as feeling anxious, confusion, dizziness, increased hunger, unusually weak or tired, sweating, shakiness, cold, irritable, headache, blurred vision, fast heartbeat, loss of consciousness -signs and symptoms of a urinary tract infection, such as fever, chills, a burning feeling when urinating, blood in the urine, back pain -trouble passing urine or change in the amount of urine, including an urgent need to urinate more often, in larger amounts, or at night -penile discharge, itching, or pain in men -unusual tiredness -vaginal discharge, itching, or odor in women Side effects that usually do not require medical attention (Report these to your doctor or health care professional if they continue or are bothersome.): -joint pain -mild increase in urination -thirsty This list may not describe all possible side effects. Call your doctor for medical advice about side effects. You may report side effects to FDA at 1-800-FDA-1088. Where should I keep my medicine? Keep out of the reach of children. Store at room temperature between 20 and 25 degrees C (68 and 77 degrees F). Throw away any unused medicine after the expiration date. NOTE: This sheet is a summary. It may not cover all possible information. If you have questions about this medicine, talk to your doctor,  pharmacist, or health care provider.    2016, Elsevier/Gold Standard. (2014-07-04 11:37:10)  

## 2016-04-08 ENCOUNTER — Other Ambulatory Visit: Payer: Self-pay | Admitting: Physician Assistant

## 2016-04-08 DIAGNOSIS — E119 Type 2 diabetes mellitus without complications: Secondary | ICD-10-CM

## 2016-04-08 MED ORDER — METFORMIN HCL 1000 MG PO TABS: 1000.0000 mg | ORAL_TABLET | Freq: Two times a day (BID) | ORAL | 3 refills | Status: DC

## 2016-04-14 ENCOUNTER — Telehealth: Payer: Self-pay | Admitting: Physician Assistant

## 2016-04-14 NOTE — Telephone Encounter (Signed)
Pharmacist reports that the patient is confused about which diabetic medications he is supposed to be taking. He reports that the patient told him that he is to continue Glipizide and discontinue Metformin. Is this correct? Please advise. Thanks!

## 2016-04-14 NOTE — Telephone Encounter (Signed)
He is to continue metformin 1000mg  BID, Onglyza 5 mg daily and add jardiance 25mg  daily. Glipizide was discontinued.

## 2016-04-14 NOTE — Telephone Encounter (Signed)
Chris with [facility] is requesting a call back to discuss pt diabetes medications.  CB#[phone]

## 2016-04-15 NOTE — Telephone Encounter (Signed)
Advised patient as below.  

## 2016-04-16 ENCOUNTER — Other Ambulatory Visit: Payer: Self-pay | Admitting: Physician Assistant

## 2016-04-29 ENCOUNTER — Telehealth: Payer: Self-pay

## 2016-04-29 NOTE — Telephone Encounter (Signed)
Glipizide has been discontinued.

## 2016-04-29 NOTE — Telephone Encounter (Signed)
Medication Request for new RX. From [facility].  Medication: Glipizide XL 10 MG TER #90 Take on tablet by mouth once a day.  I thought the Glipizide was discontinued.  Per request: note for Physician: PT IS IN NEED OF THIS.

## 2016-04-29 NOTE — Telephone Encounter (Signed)
Called [facility] and spoke to one of the associates and advised her as directed below. Per pharmacist will make a note of that so that we don't receive any more request for the  Glipizide.  Thanks,  -Joseline

## 2016-05-05 ENCOUNTER — Ambulatory Visit (INDEPENDENT_AMBULATORY_CARE_PROVIDER_SITE_OTHER): Payer: Medicare Other

## 2016-05-05 DIAGNOSIS — Z23 Encounter for immunization: Secondary | ICD-10-CM | POA: Diagnosis not present

## 2016-05-13 ENCOUNTER — Encounter: Payer: Self-pay | Admitting: Physician Assistant

## 2016-05-13 ENCOUNTER — Ambulatory Visit (INDEPENDENT_AMBULATORY_CARE_PROVIDER_SITE_OTHER): Payer: Medicare Other | Admitting: Physician Assistant

## 2016-05-13 VITALS — BP 140/80 | HR 62 | Temp 98.2°F | Resp 16 | Wt 179.0 lb

## 2016-05-13 DIAGNOSIS — E118 Type 2 diabetes mellitus with unspecified complications: Secondary | ICD-10-CM | POA: Diagnosis not present

## 2016-05-13 LAB — POCT GLYCOSYLATED HEMOGLOBIN (HGB A1C)
Est. average glucose Bld gHb Est-mCnc: 197
Hemoglobin A1C: 8.5

## 2016-05-13 MED ORDER — GLIPIZIDE ER 10 MG PO TB24: 10.0000 mg | ORAL_TABLET | Freq: Every day | ORAL | 0 refills | Status: DC

## 2016-05-13 MED ORDER — GLIPIZIDE 10 MG PO TABS: 10.0000 mg | ORAL_TABLET | Freq: Every day | ORAL | 5 refills | Status: DC

## 2016-05-13 NOTE — Patient Instructions (Signed)
Check fasting sugars every morning and check one sugar after supper once per week x 6 weeks. I will see you back then and we will recheck to see if A1c is lowering.   Continue:  Jardiance 25mg  daily Metformin 1000mg  twice daily Onglyza 5 mg q a.m. Add back Glipizide 10mg  q a.m.

## 2016-05-13 NOTE — Progress Notes (Signed)
Patient: [name] Desaulniers Sr. Male    DOB: 02-23-1941   75 y.o.   MRN: [id number] Visit Date: 05/13/2016  Today's Provider: Margaretann Loveless, [name]   Chief Complaint  Patient presents with  . Follow-up    Diabetes   Subjective:    HPI Patient comes in today to follow up Diabetes. He reports good compliance with medications. Current regimen is Metforming 1000mg  BID, Onglyza 5 MG daily, and was started on Jardiance 25mg  daily. He reports that since he started the new medicine (Jardiance) his sugar levels are looking better.He reports this morning reading was 127. Reading have been in the upper 120's and 90's.He reports havinf a fairly healthy diet. He is physically active around the home and at work.   Lab Results  Component Value Date   HGBA1C 8.5 05/13/2016   HGBA1C 8.5 (H) 03/13/2016   HGBA1C 7.9 (H) 12/12/2015       Allergies  Allergen Reactions  . Ramipril      Current Outpatient Prescriptions:  .  allopurinol (ZYLOPRIM) 300 MG tablet, Take 1 tablet (300 mg total) by mouth daily., Disp: 90 tablet, Rfl: 3 .  amLODipine-atorvastatin (CADUET) 5-20 MG tablet, TAKE 1 TABLET BY MOUTH ONCE A DAY, Disp: 90 tablet, Rfl: 3 .  digoxin (LANOXIN) 0.125 MG tablet, Take 1 tablet (125 mcg total) by mouth daily., Disp: 90 tablet, Rfl: 3 .  empagliflozin (JARDIANCE) 25 MG TABS tablet, Take 25 mg by mouth daily., Disp: 7 tablet, Rfl: 3 .  ferrous sulfate ([employer] IRON) 325 (65 FE) MG tablet, Take 325 mg by mouth daily with breakfast., Disp: , Rfl:  .  glucose blood (ONE TOUCH TEST STRIPS) test strip, Check blood sugar q a.m. fasting, Disp: 100 each, Rfl: 5 .  lisinopril (PRINIVIL,ZESTRIL) 20 MG tablet, Take 1 tablet (20 mg total) by mouth daily., Disp: 90 tablet, Rfl: 1 .  meloxicam (MOBIC) 7.5 MG tablet, Take 1 tablet (7.5 mg total) by mouth 2 (two) times daily., Disp: 60 tablet, Rfl: 3 .  metFORMIN (GLUCOPHAGE) 1000 MG tablet, Take 1 tablet (1,000 mg total) by mouth 2 (two)  times daily with a meal., Disp: 180 tablet, Rfl: 3 .  nitroGLYCERIN (NITROSTAT) 0.4 MG SL tablet, Place 1 tablet (0.4 mg total) under the tongue every 5 (five) minutes as needed for chest pain., Disp: 25 tablet, Rfl: 6 .  ONGLYZA 5 MG TABS tablet, TAKE 1 TABLET BY MOUTH ONCE A DAY, Disp: 90 tablet, Rfl: 3 .  ZOSTAVAX [id number] UNT/0.65ML injection, TO BE ADMINISTERED BY THE PHARMACIST, Disp: 1 each, Rfl: 0  Review of Systems  Constitutional: Negative.   Respiratory: Negative.   Cardiovascular: Negative.   Gastrointestinal: Negative.   Endocrine: Negative.   Neurological: Negative.     Social History  Substance Use Topics  . Smoking status: Former Smoker    Packs/day: 1.00    Years: 4.00    Types: Cigarettes  . Smokeless tobacco: Current User    Types: Chew  . Alcohol use No   Objective:   BP 140/80 (BP Location: Right Arm, Patient Position: Sitting, Cuff Size: Normal)   Pulse 62   Temp 98.2 F (36.8 C) (Oral)   Resp 16   Wt 179 lb (81.2 kg)   BMI 26.43 kg/m   Physical Exam  Constitutional: He appears well-developed and well-nourished. No distress.  HENT:  Head: Normocephalic and atraumatic.  Neck: Normal range of motion. Neck supple. No tracheal deviation present. No thyromegaly  present.  Cardiovascular: Normal rate, regular rhythm and normal heart sounds.  Exam reveals no gallop and no friction rub.   No murmur heard. Pulmonary/Chest: Effort normal and breath sounds normal. No respiratory distress. He has no wheezes. He has no rales.  Lymphadenopathy:    He has no cervical adenopathy.  Skin: He is not diaphoretic.  Vitals reviewed.      Assessment & Plan:     1. Type 2 diabetes mellitus with complication, without long-term current use of insulin (HCC) A1c did not change. Will add glipizide back at 10mg  daily. Continue onglyza 5mg  daily, jardiance 25mg  daily and metformin 1000mg  BID. I will see him back in 2 months to recheck A1c. - POCT glycosylated hemoglobin (Hb  A1C) - glipiZIDE (GLUCOTROL XL) 10 MG 24 hr tablet; Take 1 tablet (10 mg total) by mouth daily with breakfast.  Dispense: 90 tablet; Refill: 0       Margaretann LovelessJennifer M Taj Nevins, [name]  [facility]

## 2016-05-22 ENCOUNTER — Ambulatory Visit (INDEPENDENT_AMBULATORY_CARE_PROVIDER_SITE_OTHER): Payer: Medicare Other | Admitting: Physician Assistant

## 2016-05-22 ENCOUNTER — Encounter: Payer: Self-pay | Admitting: Physician Assistant

## 2016-05-22 VITALS — BP 126/56 | HR 72 | Temp 97.9°F | Resp 16 | Ht 69.0 in | Wt 180.0 lb

## 2016-05-22 DIAGNOSIS — G629 Polyneuropathy, unspecified: Secondary | ICD-10-CM | POA: Insufficient documentation

## 2016-05-22 MED ORDER — GABAPENTIN 100 MG PO CAPS: 100.0000 mg | ORAL_CAPSULE | Freq: Three times a day (TID) | ORAL | 1 refills | Status: DC

## 2016-05-22 NOTE — Patient Instructions (Signed)
Neuropathic Pain °Neuropathic pain is pain caused by damage to the nerves that are responsible for certain sensations in your body (sensory nerves). The pain can be caused by damage to:  °· The sensory nerves that send signals to your spinal cord and brain (peripheral nervous system). °· The sensory nerves in your brain or spinal cord (central nervous system). °Neuropathic pain can make you more sensitive to pain. What would be a minor sensation for most people may feel very painful if you have neuropathic pain. This is usually a long-term condition that can be difficult to treat. The type of pain can differ from person to person. It may start suddenly (acute), or it may develop slowly and last for a long time (chronic). Neuropathic pain may come and go as damaged nerves heal or may stay at the same level for years. It often causes emotional distress, loss of sleep, and a lower quality of life. °CAUSES  °The most common cause of damage to a sensory nerve is diabetes. Many other diseases and conditions can also cause neuropathic pain. Causes of neuropathic pain can be classified as: °· Toxic. Many drugs and chemicals can cause toxic damage. The most common cause of toxic neuropathic pain is damage from drug treatment for cancer (chemotherapy). °· Metabolic. This type of pain can happen when a disease causes imbalances that damage nerves. Diabetes is the most common of these diseases. Vitamin B deficiency caused by long-term alcohol abuse is another common cause. °· Traumatic. Any injury that cuts, crushes, or stretches a nerve can cause damage and pain. A common example is feeling pain after losing an arm or leg (phantom limb pain). °· Compression-related. If a sensory nerve gets trapped or compressed for a long period of time, the blood supply to the nerve can be cut off. °· Vascular. Many blood vessel diseases can cause neuropathic pain by decreasing blood supply and oxygen to nerves. °· Autoimmune. This type of  pain results from diseases in which the body's defense system mistakenly attacks sensory nerves. Examples of autoimmune diseases that can cause neuropathic pain include lupus and multiple sclerosis. °· Infectious. Many types of viral infections can damage sensory nerves and cause pain. Shingles infection is a common cause of this type of pain. °· Inherited. Neuropathic pain can be a symptom of many diseases that are passed down through families (genetic). °SIGNS AND SYMPTOMS  °The main symptom is pain. Neuropathic pain is often described as: °· Burning. °· Shock-like. °· Stinging. °· Hot or cold. °· Itching. °DIAGNOSIS  °No single test can diagnose neuropathic pain. Your health care provider will do a physical exam and ask you about your pain. You may use a pain scale to describe how bad your pain is. You may also have tests to see if you have a high sensitivity to pain and to help find the cause and location of any sensory nerve damage. These tests may include: °· Imaging studies, such as: °¨ X-rays. °¨ CT scan. °¨ MRI. °· Nerve conduction studies to test how well nerve signals travel through your sensory nerves (electrodiagnostic testing). °· Stimulating your sensory nerves through electrodes on your skin and measuring the response in your spinal cord and brain (somatosensory evoked potentials). °TREATMENT  °Treatment for neuropathic pain may change over time. You may need to try different treatment options or a combination of treatments. Some options include: °· Over-the-counter pain relievers. °· Prescription medicines. Some medicines used to treat other conditions may also help neuropathic pain. These   include medicines to: °¨ Control seizures (anticonvulsants). °¨ Relieve depression (antidepressants). °· Prescription-strength pain relievers (narcotics). These are usually used when other pain relievers do not help. °· Transcutaneous nerve stimulation (TENS). This uses electrical currents to block painful nerve  signals. The treatment is painless. °· Topical and local anesthetics. These are medicines that numb the nerves. They can be injected as a nerve block or applied to the skin. °· Alternative treatments, such as: °¨ Acupuncture. °¨ Meditation. °¨ Massage. °¨ Physical therapy. °¨ Pain management programs. °¨ Counseling. °HOME CARE INSTRUCTIONS °· Learn as much as you can about your condition. °· Take medicines only as directed by your health care provider. °· Work closely with all your health care providers to find what works best for you. °· Have a good support system at home. °· Consider joining a chronic pain support group. °SEEK MEDICAL CARE IF: °· Your pain treatments are not helping. °· You are having side effects from your medicines. °· You are struggling with fatigue, mood changes, depression, or anxiety. °  °This information is not intended to replace advice given to you by your health care provider. Make sure you discuss any questions you have with your health care provider. °  °Document Released: 04/10/2004 Document Revised: 08/04/2014 Document Reviewed: 12/22/2013 °Elsevier Interactive Patient Education ©2016 [employer] °Gabapentin capsules or tablets °What is this medicine? °GABAPENTIN (GA ba pen tin) is used to control partial seizures in adults with epilepsy. It is also used to treat certain types of nerve pain. °This medicine may be used for other purposes; ask your health care provider or pharmacist if you have questions. °What should I tell my health care provider before I take this medicine? °They need to know if you have any of these conditions: °-kidney disease °-suicidal thoughts, plans, or attempt; a previous suicide attempt by you or a family member °-an unusual or allergic reaction to gabapentin, other medicines, foods, dyes, or preservatives °-pregnant or trying to get pregnant °-breast-feeding °How should I use this medicine? °Take this medicine by mouth with a glass of water. Follow the  directions on the prescription label. You can take it with or without food. If it upsets your stomach, take it with food.Take your medicine at regular intervals. Do not take it more often than directed. Do not stop taking except on your doctor's advice. °If you are directed to break the 600 or 800 mg tablets in half as part of your dose, the extra half tablet should be used for the next dose. If you have not used the extra half tablet within 28 days, it should be thrown away. °A special MedGuide will be given to you by the pharmacist with each prescription and refill. Be sure to read this information carefully each time. °Talk to your pediatrician regarding the use of this medicine in children. Special care may be needed. °Overdosage: If you think you have taken too much of this medicine contact a poison control center or emergency room at once. °NOTE: This medicine is only for you. Do not share this medicine with others. °What if I miss a dose? °If you miss a dose, take it as soon as you can. If it is almost time for your next dose, take only that dose. Do not take double or extra doses. °What may interact with this medicine? °Do not take this medicine with any of the following medications: °-other gabapentin products °This medicine may also interact with the following medications: °-alcohol °-antacids °-antihistamines for   allergy, cough and cold °-certain medicines for anxiety or sleep °-certain medicines for depression or psychotic disturbances °-homatropine; hydrocodone °-naproxen °-narcotic medicines (opiates) for pain °-phenothiazines like chlorpromazine, mesoridazine, prochlorperazine, thioridazine °This list may not describe all possible interactions. Give your health care provider a list of all the medicines, herbs, non-prescription drugs, or dietary supplements you use. Also tell them if you smoke, drink alcohol, or use illegal drugs. Some items may interact with your medicine. °What should I watch for  while using this medicine? °Visit your doctor or health care professional for regular checks on your progress. You may want to keep a record at home of how you feel your condition is responding to treatment. You may want to share this information with your doctor or health care professional at each visit. You should contact your doctor or health care professional if your seizures get worse or if you have any new types of seizures. Do not stop taking this medicine or any of your seizure medicines unless instructed by your doctor or health care professional. Stopping your medicine suddenly can increase your seizures or their severity. °Wear a medical identification bracelet or chain if you are taking this medicine for seizures, and carry a card that lists all your medications. °You may get drowsy, dizzy, or have blurred vision. Do not drive, use machinery, or do anything that needs mental alertness until you know how this medicine affects you. To reduce dizzy or fainting spells, do not sit or stand up quickly, especially if you are an older patient. Alcohol can increase drowsiness and dizziness. Avoid alcoholic drinks. °Your mouth may get dry. Chewing sugarless gum or sucking hard candy, and drinking plenty of water will help. °The use of this medicine may increase the chance of suicidal thoughts or actions. Pay special attention to how you are responding while on this medicine. Any worsening of mood, or thoughts of suicide or dying should be reported to your health care professional right away. °Women who become pregnant while using this medicine may enroll in the [place] Antiepileptic Drug Pregnancy Registry by calling 1-888-233-2334. This registry collects information about the safety of antiepileptic drug use during pregnancy. °What side effects may I notice from receiving this medicine? °Side effects that you should report to your doctor or health care professional as soon as possible: °-allergic reactions  like skin rash, itching or hives, swelling of the face, lips, or tongue °-worsening of mood, thoughts or actions of suicide or dying °Side effects that usually do not require medical attention (report to your doctor or health care professional if they continue or are bothersome): °-constipation °-difficulty walking or controlling muscle movements °-dizziness °-nausea °-slurred speech °-tiredness °-tremors °-weight gain °This list may not describe all possible side effects. Call your doctor for medical advice about side effects. You may report side effects to FDA at 1-800-FDA-1088. °Where should I keep my medicine? °Keep out of reach of children. °This medicine may cause accidental overdose and death if it taken by other adults, children, or pets. Mix any unused medicine with a substance like cat litter or coffee grounds. Then throw the medicine away in a sealed container like a sealed bag or a coffee can with a lid. Do not use the medicine after the expiration date. °Store at room temperature between 15 and 30 degrees C (59 and 86 degrees F). °NOTE: This sheet is a summary. It may not cover all possible information. If you have questions about this medicine, talk to your doctor,   pharmacist, or health care provider. °  °© 2016, Elsevier/Gold Standard. (2013-09-09 15:26:50) ° °

## 2016-05-22 NOTE — Progress Notes (Signed)
Patient: [name] AlphaHoward Hendrick Buffkin Sr. Male    DOB: 1941/02/26   75 y.o.   MRN: [id number] Visit Date: 05/22/2016  Today's Provider: Margaretann LovelessJennifer M Reannon Candella, [name]   Chief Complaint  Patient presents with  . Leg Pain    right   Subjective:    HPI Patient c/o of right leg pain and "burning" (calf area) times 1 week. Patient denies any injuries. Pain is wore when he is standing still. Does bother him with walking as well.  Patient reports taking Oxy during the night but reports mild improvement. He is followed by Dr. Thyra BreedMark [place] in [place] for pain management for his back. 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 He reports he has 2 vertebrae that are without disc. He had an xray approx 6 months ago with Dr. Vear ClockPhillips.     Allergies  Allergen Reactions  . Ramipril      Current Outpatient Prescriptions:  .  allopurinol (ZYLOPRIM) 300 MG tablet, Take 1 tablet (300 mg total) by mouth daily., Disp: 90 tablet, Rfl: 3 .  amLODipine-atorvastatin (CADUET) 5-20 MG tablet, TAKE 1 TABLET BY MOUTH ONCE A DAY, Disp: 90 tablet, Rfl: 3 .  digoxin (LANOXIN) 0.125 MG tablet, Take 1 tablet (125 mcg total) by mouth daily., Disp: 90 tablet, Rfl: 3 .  empagliflozin (JARDIANCE) 25 MG TABS tablet, Take 25 mg by mouth daily., Disp: 7 tablet, Rfl: 3 .  ferrous sulfate ([employer] IRON) 325 (65 FE) MG tablet, Take 325 mg by mouth daily with breakfast., Disp: , Rfl:  .  glipiZIDE (GLUCOTROL XL) 10 MG 24 hr tablet, Take 1 tablet (10 mg total) by mouth daily with breakfast., Disp: 90 tablet, Rfl: 0 .  glucose blood (ONE TOUCH TEST STRIPS) test strip, Check blood sugar q a.m. fasting, Disp: 100 each, Rfl: 5 .  lisinopril (PRINIVIL,ZESTRIL) 20 MG tablet, Take 1 tablet (20 mg total) by mouth daily., Disp: 90 tablet, Rfl: 1 .  meloxicam (MOBIC) 7.5 MG tablet, Take 1 tablet (7.5 mg total) by mouth 2 (two) times daily., Disp: 60 tablet, Rfl: 3 .  metFORMIN (GLUCOPHAGE) 1000 MG tablet, Take 1 tablet (1,000 mg total)  by mouth 2 (two) times daily with a meal., Disp: 180 tablet, Rfl: 3 .  nitroGLYCERIN (NITROSTAT) 0.4 MG SL tablet, Place 1 tablet (0.4 mg total) under the tongue every 5 (five) minutes as needed for chest pain., Disp: 25 tablet, Rfl: 6 .  ONGLYZA 5 MG TABS tablet, TAKE 1 TABLET BY MOUTH ONCE A DAY, Disp: 90 tablet, Rfl: 3 .  ZOSTAVAX [id number] UNT/0.65ML injection, TO BE ADMINISTERED BY THE PHARMACIST, Disp: 1 each, Rfl: 0  Review of Systems  Constitutional: Negative.   Respiratory: Negative.   Cardiovascular: Negative.   Gastrointestinal: Negative.   Musculoskeletal: Positive for back pain and myalgias.  Neurological: Negative for weakness.    Social History  Substance Use Topics  . Smoking status: Former Smoker    Packs/day: 1.00    Years: 4.00    Types: Cigarettes  . Smokeless tobacco: Current User    Types: Chew  . Alcohol use No   Objective:   BP (!) 126/56 (BP Location: Left Arm, Patient Position: Sitting, Cuff Size: Large)   Pulse 72   Temp 97.9 F (36.6 C) (Oral)   Resp 16   Ht 5\' 9"  (1.753 m)   Wt 180 lb (81.6 kg)   BMI 26.58 kg/m   Physical Exam  Constitutional: He appears well-developed and  well-nourished. No distress.  HENT:  Head: Normocephalic and atraumatic.  Neck: Normal range of motion. Neck supple.  Cardiovascular: Normal rate, regular rhythm and normal heart sounds.  Exam reveals no gallop and no friction rub.   No murmur heard. Pulses:      Dorsalis pedis pulses are 2+ on the right side, and 2+ on the left side.       Posterior tibial pulses are 2+ on the right side, and 2+ on the left side.  Pulmonary/Chest: Effort normal and breath sounds normal. No respiratory distress. He has no wheezes. He has no rales.  Musculoskeletal:       Lumbar back: He exhibits decreased range of motion and tenderness. He exhibits no bony tenderness and no spasm.       Right lower leg: He exhibits no swelling.       Left lower leg: Normal.  Feels like lower leg and foot  are on fire on the right side. Mild calf tenderness. No swelling. DP and PT pulses intact and foot warm. Microfilament exam of right foot is normal but he does report decreased sensation to light touch of the toes on the right.  Skin: He is not diaphoretic.  Vitals reviewed.     Assessment & Plan:     1. Neuropathy (HCC) Pain bothers him most with standing still, some with walking but not as bad as standing. Distal pulses are palpable and foot is warm. No abnormal hair loss. Long history of chronic low back pain and DDD and uncontrolled diabetes. 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 Will add gabapentin as below. He is to call Dr. Vear Clock for f/u and to let them know I have started him on Gabapentin. He is to call if symptoms worsen or fail to improve. Already has f/u for 07/01/16. - gabapentin (NEURONTIN) 100 MG capsule; Take 1 capsule (100 mg total) by mouth 3 (three) times daily.  Dispense: 90 capsule; Refill: 1       Margaretann Loveless, [name]  [facility]

## 2016-06-06 ENCOUNTER — Other Ambulatory Visit: Payer: Self-pay | Admitting: Physician Assistant

## 2016-06-06 DIAGNOSIS — I10 Essential (primary) hypertension: Secondary | ICD-10-CM

## 2016-06-13 ENCOUNTER — Ambulatory Visit: Payer: Medicare Other | Admitting: Physician Assistant

## 2016-06-28 ENCOUNTER — Other Ambulatory Visit: Payer: Self-pay | Admitting: Physician Assistant

## 2016-06-28 DIAGNOSIS — G629 Polyneuropathy, unspecified: Secondary | ICD-10-CM

## 2016-06-30 ENCOUNTER — Encounter: Payer: Self-pay | Admitting: Cardiovascular Disease

## 2016-06-30 ENCOUNTER — Ambulatory Visit (INDEPENDENT_AMBULATORY_CARE_PROVIDER_SITE_OTHER): Payer: Medicare Other | Admitting: Cardiovascular Disease

## 2016-06-30 VITALS — BP 130/62 | HR 69 | Ht 70.0 in | Wt 178.0 lb

## 2016-06-30 DIAGNOSIS — E118 Type 2 diabetes mellitus with unspecified complications: Secondary | ICD-10-CM

## 2016-06-30 DIAGNOSIS — I10 Essential (primary) hypertension: Secondary | ICD-10-CM

## 2016-06-30 DIAGNOSIS — I25719 Atherosclerosis of autologous vein coronary artery bypass graft(s) with unspecified angina pectoris: Secondary | ICD-10-CM | POA: Diagnosis not present

## 2016-06-30 DIAGNOSIS — E782 Mixed hyperlipidemia: Secondary | ICD-10-CM | POA: Diagnosis not present

## 2016-06-30 NOTE — Patient Instructions (Signed)

## 2016-06-30 NOTE — Progress Notes (Signed)
Cardiology Office Note  Date:  06/30/2016   ID:  [name] AlphaHoward Hendrick Leedom Sr., DOB [birth year], MRN [id number]  PCP:  Margaretann LovelessJennifer M Burnette, [name]   Chief Complaint  Patient presents with  . other    12 month follow up. Meds reviewed by the pt. verbally. "doing well."     HPI:  [name] is a very pleasant 75 year old gentleman with coronary artery disease, bypass surgery in 2004, occluded graft to the diagonal branch with 3 other patent grafts to the RCA, OM with a LIMA to the LAD by catheterization in 2007 who presents for routine followup Of his coronary artery disease .   In follow-up today he reports that he is doing well, denies any chest pain or anginal symptoms Very active at baseline though no regular exercise program "Chops wood " frequently   Right leg pain 3 weeks ago, "neuropathy" He was told  Tingling, numb Had a cortisone by Dr. 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  Chronic back pain. Previous cortisone shots in his back    PMH:   has a past medical history of Arthritis; Back pain; Colon polyps; DM2 (diabetes mellitus, type 2) (HCC); GERD (gastroesophageal reflux disease); Glaucoma; Gout; HLD (hyperlipidemia); HTN (hypertension); and Unstable angina (HCC).  PSH:    Past Surgical History:  Procedure Laterality Date  . CORONARY ARTERY BYPASS GRAFT  2004    Current Outpatient Prescriptions  Medication Sig Dispense Refill  . allopurinol (ZYLOPRIM) 300 MG tablet Take 1 tablet  (300 mg total) by mouth daily. 90 tablet 3  . amLODipine-atorvastatin (CADUET) 5-20 MG tablet TAKE 1 TABLET BY MOUTH ONCE A DAY 90 tablet 3  . digoxin (LANOXIN) 0.125 MG tablet Take 1 tablet (125 mcg total) by mouth daily. 90 tablet 3  . empagliflozin (JARDIANCE) 25 MG TABS tablet Take 25 mg by mouth daily. 7 tablet 3  . ferrous sulfate ([employer] IRON) 325 (65 FE) MG tablet Take 325 mg by mouth daily with breakfast.    . gabapentin (NEURONTIN) 100 MG capsule TAKE 1 CAPSULE BY MOUTH 3 TIMES DAILY 90 capsule 5  . glipiZIDE (GLUCOTROL XL) 10 MG 24 hr tablet Take 1 tablet (10 mg total) by mouth daily with breakfast. 90 tablet 0  . glucose blood (ONE TOUCH TEST STRIPS) test strip Check blood sugar q a.m. fasting 100 each 5  . lisinopril (PRINIVIL,ZESTRIL) 20 MG tablet TAKE 1 TABLET BY MOUTH ONCE DAILY 90 tablet 3  . meloxicam (MOBIC) 7.5 MG tablet Take 1 tablet (7.5 mg total) by mouth 2 (two) times daily. 60 tablet 3  . metFORMIN (GLUCOPHAGE) 1000 MG tablet Take 1 tablet (1,000 mg total) by mouth 2 (two) times daily with a meal. 180 tablet 3  . nitroGLYCERIN (NITROSTAT) 0.4 MG SL tablet Place 1 tablet (0.4 mg total) under the tongue every 5 (five) minutes as needed for chest pain. 25 tablet 6  . ONGLYZA 5 MG TABS tablet TAKE 1 TABLET BY MOUTH ONCE A DAY  90 tablet 3  . ZOSTAVAX [id number] UNT/0.65ML injection TO BE ADMINISTERED BY THE PHARMACIST 1 each 0   No current facility-administered medications for this visit.      Allergies:   Ramipril   Social History:  The patient  reports that he has quit smoking. His smoking use included Cigarettes. He has a 4.00 pack-year smoking history. His smokeless tobacco use includes Chew. He reports that he does not drink alcohol or use drugs.   Family History:   Family history is unknown by patient.    Review of Systems: Review of Systems  Constitutional: Negative.   Respiratory: Negative.   Cardiovascular: Negative.   Gastrointestinal: Negative.    Musculoskeletal: Positive for back pain.       Right leg numbness  Neurological: Negative.   Psychiatric/Behavioral: Negative.   All other systems reviewed and are negative.    PHYSICAL EXAM: VS:  BP 130/62 (BP Location: Left Arm, Patient Position: Sitting, Cuff Size: Normal)   Pulse 69   Ht 5\' 10"  (1.778 m)   Wt 178 lb (80.7 kg)   BMI 25.54 kg/m  , BMI Body mass index is 25.54 kg/m. GEN: Well nourished, well developed, in no acute distress  HEENT: normal  Neck: no JVD, carotid bruits, or masses Cardiac: RRR; no murmurs, rubs, or gallops,no edema  Respiratory:  clear to auscultation bilaterally, normal work of breathing GI: soft, nontender, nondistended, + BS MS: no deformity or atrophy  Skin: warm and dry, no rash Neuro:  Strength and sensation are intact Psych: euthymic mood, full affect    Recent Labs: 03/13/2016: ALT 19; BUN 11; Creatinine, Ser 1.03; Platelets 226; Potassium 4.6; Sodium 139; TSH 2.810    Lipid Panel Lab Results  Component Value Date   CHOL 106 03/13/2016   HDL 34 (L) 03/13/2016   LDLCALC 46 03/13/2016   TRIG 129 03/13/2016      Wt Readings from Last 3 Encounters:  06/30/16 178 lb (80.7 kg)  05/22/16 180 lb (81.6 kg)  05/13/16 179 lb (81.2 kg)       ASSESSMENT AND PLAN:  Mixed hyperlipidemia Cholesterol is at goal on the current lipid regimen. No changes to the medications were made.  Essential hypertension - Plan: EKG 12-Lead Blood pressure is well controlled on today's visit. No changes made to the medications.  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 No further workup at this time. Continue current medication regimen.  Type 2 diabetes mellitus with complication, without long-term current use of insulin (HCC) Hemoglobin A1c is elevated Also with recent cortisone shot Recommended strict diet, dietary guide provided    Total encounter time more than 15 minutes  Greater than 50% was spent in counseling and coordination of care with the patient   Disposition:   F/U  6 months   Orders Placed This Encounter  Procedures  . EKG 12-Lead     Signed, Dossie Arbourim Gollan, M.D., Ph.D. 06/30/2016  [facility] [place] [phone]

## 2016-07-01 ENCOUNTER — Ambulatory Visit (INDEPENDENT_AMBULATORY_CARE_PROVIDER_SITE_OTHER): Payer: Medicare Other | Admitting: Physician Assistant

## 2016-07-01 ENCOUNTER — Encounter: Payer: Self-pay | Admitting: Physician Assistant

## 2016-07-01 VITALS — BP 140/80 | HR 73 | Temp 98.4°F | Resp 16 | Wt 180.0 lb

## 2016-07-01 DIAGNOSIS — G629 Polyneuropathy, unspecified: Secondary | ICD-10-CM | POA: Diagnosis not present

## 2016-07-01 DIAGNOSIS — E118 Type 2 diabetes mellitus with unspecified complications: Secondary | ICD-10-CM | POA: Diagnosis not present

## 2016-07-01 LAB — POCT GLYCOSYLATED HEMOGLOBIN (HGB A1C)
Est. average glucose Bld gHb Est-mCnc: 189
HEMOGLOBIN A1C: 8.2

## 2016-07-01 NOTE — Progress Notes (Signed)
Patient: [name] AlphaHoward Hendrick Mancia Sr. Male    DOB: 1941-02-26   75 y.o.   MRN: [id number] Visit Date: 07/01/2016  Today's Provider: Margaretann LovelessJennifer M Jarris Kortz, [name]   Chief Complaint  Patient presents with  . Follow-up    Hypertension,Diabetes,Neuropathy   Subjective:    HPI Patient is for follow-up on Hypertension,diabetes,Neuropathy.  Diabetes mellitus type 2: Glipizide was added back at 10 mg daily. A1C was 8.5. He is using Onglyza 5 mg, Jardiance 25mg  Metformin 1000 mg.He reports his sugar this morning was 160. He also reports that he went to sleep around 1 am today. Lab Results  Component Value Date   HGBA1C 8.2 07/01/2016   HGBA1C 8.5 05/13/2016   HGBA1C 8.5 (H) 03/13/2016   Lab Results  Component Value Date   LDLCALC 46 03/13/2016   CREATININE 1.03 03/13/2016     Essential Hypertension: Patient was seen by Dr.Gollan. Patient is controlled. No changes in medication. He keeps very busy and active though no regular exercise program. He reports "eating healthy".  Neuropathy: Last office visit gabapentin was added. Per patient he feels a little better. He is not having any side effects from the medication.     Allergies  Allergen Reactions  . Ramipril      Current Outpatient Prescriptions:  .  allopurinol (ZYLOPRIM) 300 MG tablet, Take 1 tablet (300 mg total) by mouth daily., Disp: 90 tablet, Rfl: 3 .  amLODipine-atorvastatin (CADUET) 5-20 MG tablet, TAKE 1 TABLET BY MOUTH ONCE A DAY, Disp: 90 tablet, Rfl: 3 .  digoxin (LANOXIN) 0.125 MG tablet, Take 1 tablet (125 mcg total) by mouth daily., Disp: 90 tablet, Rfl: 3 .  empagliflozin (JARDIANCE) 25 MG TABS tablet, Take 25 mg by mouth daily., Disp: 7 tablet, Rfl: 3 .  ferrous sulfate ([employer]S IRON) 325 (65 FE) MG tablet, Take 325 mg by mouth daily with breakfast., Disp: , Rfl:  .  gabapentin (NEURONTIN) 100 MG capsule, TAKE 1 CAPSULE BY MOUTH 3 TIMES DAILY, Disp: 90 capsule, Rfl: 5 .  glipiZIDE (GLUCOTROL XL) 10 MG 24 hr  tablet, Take 1 tablet (10 mg total) by mouth daily with breakfast., Disp: 90 tablet, Rfl: 0 .  glucose blood (ONE TOUCH TEST STRIPS) test strip, Check blood sugar q a.m. fasting, Disp: 100 each, Rfl: 5 .  lisinopril (PRINIVIL,ZESTRIL) 20 MG tablet, TAKE 1 TABLET BY MOUTH ONCE DAILY, Disp: 90 tablet, Rfl: 3 .  meloxicam (MOBIC) 7.5 MG tablet, Take 1 tablet (7.5 mg total) by mouth 2 (two) times daily., Disp: 60 tablet, Rfl: 3 .  metFORMIN (GLUCOPHAGE) 1000 MG tablet, Take 1 tablet (1,000 mg total) by mouth 2 (two) times daily with a meal., Disp: 180 tablet, Rfl: 3 .  nitroGLYCERIN (NITROSTAT) 0.4 MG SL tablet, Place 1 tablet (0.4 mg total) under the tongue every 5 (five) minutes as needed for chest pain., Disp: 25 tablet, Rfl: 6 .  ONGLYZA 5 MG TABS tablet, TAKE 1 TABLET BY MOUTH ONCE A DAY, Disp: 90 tablet, Rfl: 3 .  ZOSTAVAX [id number] UNT/0.65ML injection, TO BE ADMINISTERED BY THE PHARMACIST, Disp: 1 each, Rfl: 0  Review of Systems  Constitutional: Negative.   Respiratory: Negative.   Cardiovascular: Negative.   Gastrointestinal: Negative.   Musculoskeletal: Positive for arthralgias, back pain and myalgias.       Chronic  Neurological: Negative.   Psychiatric/Behavioral: Negative.     Social History  Substance Use Topics  . Smoking status: Former Smoker    Packs/day: 1.00  Years: 4.00    Types: Cigarettes  . Smokeless tobacco: Current User    Types: Chew  . Alcohol use No   Objective:   BP 140/80 (BP Location: Right Arm, Patient Position: Sitting, Cuff Size: Normal)   Pulse 73   Temp 98.4 F (36.9 C) (Oral)   Resp 16   Wt 180 lb (81.6 kg)   BMI 25.83 kg/m   Physical Exam  Constitutional: He appears well-developed and well-nourished. No distress.  HENT:  Head: Normocephalic and atraumatic.  Neck: Normal range of motion. Neck supple. No JVD present. No tracheal deviation present. No thyromegaly present.  Cardiovascular: Normal rate, regular rhythm and normal heart sounds.   Exam reveals no gallop and no friction rub.   No murmur heard. Pulmonary/Chest: Effort normal and breath sounds normal. No respiratory distress. He has no wheezes. He has no rales.  Musculoskeletal: He exhibits no edema.  Lymphadenopathy:    He has no cervical adenopathy.  Skin: He is not diaphoretic.  Psychiatric: He has a normal mood and affect. His behavior is normal. Judgment and thought content normal.  Vitals reviewed.     Assessment & Plan:     1. Type 2 diabetes mellitus with complication, without long-term current use of insulin (HCC) A1c is improving with adding glipizide xr 10mg  back to regimen. He is also to continue jardiance 25mg . Metformin 1000mg  BID, and onglyza 5mg . I will see him back in 3 months for recehck of A1c. - POCT glycosylated hemoglobin (Hb A1C)  2. Neuropathy (HCC) Stable. Continue gabapentin.        Margaretann LovelessJennifer M Dempsy Damiano, [name]  [facility]

## 2016-07-01 NOTE — Patient Instructions (Signed)

## 2016-08-05 ENCOUNTER — Other Ambulatory Visit: Payer: Self-pay | Admitting: Physician Assistant

## 2016-08-05 DIAGNOSIS — M2550 Pain in unspecified joint: Secondary | ICD-10-CM

## 2016-08-22 ENCOUNTER — Ambulatory Visit (INDEPENDENT_AMBULATORY_CARE_PROVIDER_SITE_OTHER): Payer: Medicare Other | Admitting: Physician Assistant

## 2016-08-22 ENCOUNTER — Encounter: Payer: Self-pay | Admitting: Physician Assistant

## 2016-08-22 VITALS — BP 152/68 | HR 68 | Temp 98.5°F | Resp 16 | Wt 180.0 lb

## 2016-08-22 DIAGNOSIS — L02232 Carbuncle of back [any part, except buttock]: Secondary | ICD-10-CM | POA: Diagnosis not present

## 2016-08-22 DIAGNOSIS — L02222 Furuncle of back [any part, except buttock]: Secondary | ICD-10-CM

## 2016-08-22 MED ORDER — DOXYCYCLINE HYCLATE 100 MG PO TABS: 100.0000 mg | ORAL_TABLET | Freq: Two times a day (BID) | ORAL | 0 refills | Status: AC

## 2016-08-22 NOTE — Progress Notes (Signed)
Patient: [name] Vespa Sr. Male    DOB: 1940-10-10   76 y.o.   MRN: [id number] Visit Date: 08/22/2016  Today's Provider: Trey Sailors, [name]   Chief Complaint  Patient presents with  . Recurrent Skin Infections   Subjective:    HPI Abscess: Patient is a 76 y/o man with history of type II diabetes and boils on his back who presents for evaluation of a cutaneous abscess. Lesion is located in the Left back under his shoulder. Onset was 1 week ago. Symptoms have Unchanged. Abscess has associated symptoms of pain, minor drainage. Patient does have previous history of cutaneous abscesses, that his daughter or himself is usually able to pop. This one he cannot reach and is unable to pop it. He does not have fever, chills, nausea, or vomiting.      Allergies  Allergen Reactions  . Ramipril      Current Outpatient Prescriptions:  .  allopurinol (ZYLOPRIM) 300 MG tablet, Take 1 tablet (300 mg total) by mouth daily., Disp: 90 tablet, Rfl: 3 .  amLODipine-atorvastatin (CADUET) 5-20 MG tablet, TAKE 1 TABLET BY MOUTH ONCE A DAY, Disp: 90 tablet, Rfl: 3 .  digoxin (LANOXIN) 0.125 MG tablet, Take 1 tablet (125 mcg total) by mouth daily., Disp: 90 tablet, Rfl: 3 .  empagliflozin (JARDIANCE) 25 MG TABS tablet, Take 25 mg by mouth daily., Disp: 7 tablet, Rfl: 3 .  ferrous sulfate ([employer]S IRON) 325 (65 FE) MG tablet, Take 325 mg by mouth daily with breakfast., Disp: , Rfl:  .  gabapentin (NEURONTIN) 100 MG capsule, TAKE 1 CAPSULE BY MOUTH 3 TIMES DAILY, Disp: 90 capsule, Rfl: 5 .  glipiZIDE (GLUCOTROL XL) 10 MG 24 hr tablet, Take 1 tablet (10 mg total) by mouth daily with breakfast., Disp: 90 tablet, Rfl: 0 .  glucose blood (ONE TOUCH TEST STRIPS) test strip, Check blood sugar q a.m. fasting, Disp: 100 each, Rfl: 5 .  lisinopril (PRINIVIL,ZESTRIL) 20 MG tablet, TAKE 1 TABLET BY MOUTH ONCE DAILY, Disp: 90 tablet, Rfl: 3 .  meloxicam (MOBIC) 7.5 MG tablet, TAKE 1 TABLET BY MOUTH TWICE  (2) DAILY, Disp: 60 tablet, Rfl: 5 .  metFORMIN (GLUCOPHAGE) 1000 MG tablet, Take 1 tablet (1,000 mg total) by mouth 2 (two) times daily with a meal., Disp: 180 tablet, Rfl: 3 .  nitroGLYCERIN (NITROSTAT) 0.4 MG SL tablet, Place 1 tablet (0.4 mg total) under the tongue every 5 (five) minutes as needed for chest pain., Disp: 25 tablet, Rfl: 6 .  ONGLYZA 5 MG TABS tablet, TAKE 1 TABLET BY MOUTH ONCE A DAY, Disp: 90 tablet, Rfl: 3  Review of Systems  Constitutional: Negative.   Musculoskeletal: Positive for arthralgias and back pain.    Social History  Substance Use Topics  . Smoking status: Former Smoker    Packs/day: 1.00    Years: 4.00    Types: Cigarettes  . Smokeless tobacco: Current User    Types: Chew  . Alcohol use No   Objective:   BP (!) 152/68 (BP Location: Right Arm, Patient Position: Sitting, Cuff Size: Normal)   Pulse 68   Temp 98.5 F (36.9 C) (Oral)   Resp 16   Wt 180 lb (81.6 kg)   BMI 25.83 kg/m   Physical Exam  Constitutional: He appears well-developed and well-nourished.  Cardiovascular: Normal rate.   Pulmonary/Chest: Effort normal.  Skin:           Assessment & Plan:  1. Boil, back  Sent for culture. Will treat as below. Instructed on warm soaks. If boil does not auto drain, needs to come back for I&D.   - doxycycline (VIBRA-TABS) 100 MG tablet; Take 1 tablet (100 mg total) by mouth 2 (two) times daily.  Dispense: 14 tablet; Refill: 0 - Aerobic Culture  Return if symptoms worsen or fail to improve.   Patient Instructions  Skin Abscess A skin abscess is an infected area on or under your skin that contains pus and other material. An abscess can happen almost anywhere on your body. Some abscesses break open (rupture) on their own. Most continue to get worse unless they are treated. The infection can spread deeper into the body and into your blood, which can make you feel sick. Treatment usually involves draining the abscess. Follow these  instructions at home: Abscess Care  If you have an abscess that has not drained, place a warm, clean, wet washcloth over the abscess several times a day. Do this as told by your doctor.  Follow instructions from your doctor about how to take care of your abscess. Make sure you:  Cover the abscess with a bandage (dressing).  Change your bandage or gauze as told by your doctor.  Wash your hands with soap and water before you change the bandage or gauze. If you cannot use soap and water, use hand sanitizer.  Check your abscess every day for signs that the infection is getting worse. Check for:  More redness, swelling, or pain.  More fluid or blood.  Warmth.  More pus or a bad smell. Medicines  Take over-the-counter and prescription medicines only as told by your doctor.  If you were prescribed an antibiotic medicine, take it as told by your doctor. Do not stop taking the antibiotic even if you start to feel better. General instructions  To avoid spreading the infection:  Do not share personal care items, towels, or hot tubs with others.  Avoid making skin-to-skin contact with other people.  Keep all follow-up visits as told by your doctor. This is important. Contact a doctor if:  You have more redness, swelling, or pain around your abscess.  You have more fluid or blood coming from your abscess.  Your abscess feels warm when you touch it.  You have more pus or a bad smell coming from your abscess.  You have a fever.  Your muscles ache.  You have chills.  You feel sick. Get help right away if:  You have very bad (severe) pain.  You see red streaks on your skin spreading away from the abscess. This information is not intended to replace advice given to you by your health care provider. Make sure you discuss any questions you have with your health care provider. Document Released: 12/31/2007 Document Revised: 03/09/2016 Document Reviewed: 05/23/2015 Elsevier  Interactive Patient Education  2017 [employer]   The entirety of the information documented in the History of Present Illness, Review of Systems and Physical Exam were personally obtained by me. Portions of this information were initially documented by Kavin LeechLaura Kalenna Millett, CMA and reviewed by me for thoroughness and accuracy.          Trey SailorsAdriana M Pollak, [name]  [facility]

## 2016-08-22 NOTE — Patient Instructions (Signed)

## 2016-08-25 LAB — AEROBIC CULTURE

## 2016-08-26 ENCOUNTER — Telehealth: Payer: Self-pay

## 2016-08-26 NOTE — Telephone Encounter (Signed)
Sorry to hear antibiotics didn't work. Please have him come in Thursday for I&D. He can be on my schedule. Thank you.

## 2016-08-26 NOTE — Telephone Encounter (Signed)
-----   Message from Trey SailorsAdriana M Pollak, [place]PA-C sent at 08/26/2016  9:09 AM EST ----- Skin culture came back mixed skin flora. Do suspect there was still infection that just did not culture out. Hope patient is feeling better and his boil has gone down.

## 2016-08-26 NOTE — Telephone Encounter (Signed)
Patient advised.  Said he was not any better.  He was seen by his back doctor today who told him that it needed to be opened.  I didn't know if you did these.  Do you want to see him again or have someone else see him.  Thanks ED

## 2016-08-26 NOTE — Progress Notes (Signed)
Patient said that he is not any better.  He said he saw his back doctor today and had him look at it and he said it looked like it needed to be opened.  Did you want him scheduled with you to re-eval or with someone else.  I didn't know if you opened cysts. Thanks ED

## 2016-08-26 NOTE — Telephone Encounter (Signed)
Done  ED 

## 2016-08-28 ENCOUNTER — Ambulatory Visit (INDEPENDENT_AMBULATORY_CARE_PROVIDER_SITE_OTHER): Payer: Medicare Other | Admitting: Physician Assistant

## 2016-08-28 ENCOUNTER — Encounter: Payer: Self-pay | Admitting: *Deleted

## 2016-08-28 ENCOUNTER — Encounter: Payer: Self-pay | Admitting: Physician Assistant

## 2016-08-28 VITALS — BP 154/76 | HR 76 | Temp 98.5°F | Resp 16 | Wt 180.0 lb

## 2016-08-28 DIAGNOSIS — L723 Sebaceous cyst: Secondary | ICD-10-CM

## 2016-08-28 NOTE — Progress Notes (Signed)
Patient: [name] Sr. Male    DOB: July 01, 1941   76 y.o.   MRN: [id number] Visit Date: 08/28/2016  Today's Provider: Trey Sailors, [name]   No chief complaint on file.  Subjective:    HPI  Abscess: Patient presents for evaluation of a cutaneous abscess. Lesion is located in the Upper back. Onset was 2 weeks ago. Symptoms have gradually improved. He has been on 7 days doxycycline 100 mg BID.  Abscess has associated symptoms of spontaneous drainage, pain. 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 Patient does have diabetes.    Allergies  Allergen Reactions  . Ramipril      Current Outpatient Prescriptions:  .  allopurinol (ZYLOPRIM) 300 MG tablet, Take 1 tablet (300 mg total) by mouth daily., Disp: 90 tablet, Rfl: 3 .  amLODipine-atorvastatin (CADUET) 5-20 MG tablet, TAKE 1 TABLET BY MOUTH ONCE A DAY, Disp: 90 tablet, Rfl: 3 .  digoxin (LANOXIN) 0.125 MG tablet, Take 1 tablet (125 mcg total) by mouth daily., Disp: 90 tablet, Rfl: 3 .  doxycycline (VIBRA-TABS) 100 MG tablet, Take 1 tablet (100 mg total) by mouth 2 (two) times daily., Disp: 14 tablet, Rfl: 0 .  empagliflozin (JARDIANCE) 25 MG TABS tablet, Take 25 mg by mouth daily., Disp: 7 tablet, Rfl: 3 .  ferrous sulfate ([employer] IRON) 325 (65 FE) MG tablet, Take 325 mg by mouth daily with breakfast., Disp: , Rfl:  .  gabapentin (NEURONTIN) 100 MG capsule, TAKE 1 CAPSULE BY MOUTH 3 TIMES DAILY, Disp: 90 capsule, Rfl: 5 .  glipiZIDE (GLUCOTROL XL) 10 MG 24 hr tablet, Take 1 tablet (10 mg total) by mouth daily with breakfast., Disp: 90 tablet, Rfl: 0 .  glucose blood (ONE TOUCH TEST STRIPS) test strip, Check blood sugar q a.m. fasting, Disp: 100 each, Rfl: 5 .  lisinopril (PRINIVIL,ZESTRIL) 20 MG tablet, TAKE 1 TABLET BY MOUTH ONCE DAILY, Disp: 90 tablet, Rfl: 3 .  meloxicam (MOBIC) 7.5 MG tablet, TAKE 1 TABLET BY MOUTH TWICE (2) DAILY, Disp: 60 tablet, Rfl: 5 .  metFORMIN (GLUCOPHAGE) 1000 MG tablet,  Take 1 tablet (1,000 mg total) by mouth 2 (two) times daily with a meal., Disp: 180 tablet, Rfl: 3 .  nitroGLYCERIN (NITROSTAT) 0.4 MG SL tablet, Place 1 tablet (0.4 mg total) under the tongue every 5 (five) minutes as needed for chest pain., Disp: 25 tablet, Rfl: 6 .  ONGLYZA 5 MG TABS tablet, TAKE 1 TABLET BY MOUTH ONCE A DAY, Disp: 90 tablet, Rfl: 3  Review of Systems  Constitutional: Negative.   Musculoskeletal: Positive for back pain.    Social History  Substance Use Topics  . Smoking status: Former Smoker    Packs/day: 1.00    Years: 4.00    Types: Cigarettes  . Smokeless tobacco: Current User    Types: Chew  . Alcohol use No   Objective:   BP (!) 154/76 (BP Location: Left Arm, Patient Position: Sitting, Cuff Size: Normal)   Pulse 76   Temp 98.5 F (36.9 C) (Oral)   Resp 16   Wt 180 lb (81.6 kg)   BMI 25.83 kg/m   Physical Exam  Constitutional: He appears well-developed and well-nourished.  Cardiovascular: Normal rate.   Pulmonary/Chest: Effort normal.  Skin: Skin is warm and dry. There is erythema.      Procedure, benefits and risks (including bleeding, infection and allergic reaction) and alternatives were explained to the patient. All questions were sought and answered.  Verbal consent was obtained.  The area was then prepped with betadine swabs and draped in a sterile fashion. Local anesthetic was administered with 1.5cc of 2% xylocaine with epinephrine. An incision was then made using a size 11 scalpel blade. The area was then drained, and expressed a small amount of serosanguinous fluid, no purulent drainage. The area was then cleaned and covered with a dry dressing. There were no complications and patient tolerated well. Wound cleansing instructions and signs of infection were verbally given to the patient.       Assessment & Plan:      1. Sebaceous cyst  Lesions without purulent drainage today after I&D. Patient has almost completed doxycycline. There  seems to be infectious resolution, though we were unable to extract sac in office today. Will refer to surgery for this as it is in bothersome area to patient.  - Ambulatory referral to General Surgery  Return if symptoms worsen or fail to improve.   Patient Instructions  Incision and Drainage, Care After Refer to this sheet in the next few weeks. These instructions provide you with information about caring for yourself after your procedure. Your health care provider may also give you more specific instructions. Your treatment has been planned according to current medical practices, but problems sometimes occur. Call your health care provider if you have any problems or questions after your procedure. What can I expect after the procedure? After the procedure, it is common to have:  Pain or discomfort around your incision site.  Drainage from your incision. Follow these instructions at home:  Take over-the-counter and prescription medicines only as told by your health care provider.  If you were prescribed an antibiotic medicine, take it as told by your health care provider.Do not stop taking the antibiotic even if you start to feel better.  Followinstructions from your health care provider about:  How to take care of your incision.  When and how you should change your packing and bandage (dressing). Wash your hands with soap and water before you change your dressing. If soap and water are not available, use hand sanitizer.  When you should remove your dressing.  Do not take baths, swim, or use a hot tub until your health care provider approves.  Keep all follow-up visits as told by your health care provider. This is important.  Check your incision area every day for signs of infection. Check for:  More redness, swelling, or pain.  More fluid or blood.  Warmth.  Pus or a bad smell. Contact a health care provider if:  Your cyst or abscess returns.  You have a  fever.  You have more redness, swelling, or pain around your incision.  You have more fluid or blood coming from your incision.  Your incision feels warm to the touch.  You have pus or a bad smell coming from your incision. Get help right away if:  You have severe pain or bleeding.  You cannot eat or drink without vomiting.  You have decreased urine output.  You become short of breath.  You have chest pain.  You cough up blood.  The area where the incision and drainage occurred becomes numb or it tingles. This information is not intended to replace advice given to you by your health care provider. Make sure you discuss any questions you have with your health care provider. Document Released: 10/06/2011 Document Revised: 12/14/2015 Document Reviewed: 05/04/2015 Elsevier Interactive Patient Education  2017 [employer]   The  entirety of the information documented in the History of Present Illness, Review of Systems and Physical Exam were personally obtained by me. Portions of this information were initially documented by Ashley Royalty, CMA and reviewed by me for thoroughness and accuracy.          Trinna Post, [name]  [facility]

## 2016-08-28 NOTE — Patient Instructions (Signed)
Incision and Drainage, Care After  Refer to this sheet in the next few weeks. These instructions provide you with information about caring for yourself after your procedure. Your health care provider may also give you more specific instructions. Your treatment has been planned according to current medical practices, but problems sometimes occur. Call your health care provider if you have any problems or questions after your procedure.  What can I expect after the procedure?  After the procedure, it is common to have:  · Pain or discomfort around your incision site.  · Drainage from your incision.     Follow these instructions at home:  ·   · Take over-the-counter and prescription medicines only as told by your health care provider.  · If you were prescribed an antibiotic medicine, take it as told by your health care provider. Do not stop taking the antibiotic even if you start to feel better.  · Follow instructions from your health care provider about:  ? How to take care of your incision.  ? When and how you should change your packing and bandage (dressing). Wash your hands with soap and water before you change your dressing. If soap and water are not available, use hand sanitizer.  ? When you should remove your dressing.  · Do not take baths, swim, or use a hot tub until your health care provider approves.  · Keep all follow-up visits as told by your health care provider. This is important.  · Check your incision area every day for signs of infection. Check for:  ? More redness, swelling, or pain.  ? More fluid or blood.  ? Warmth.  ? Pus or a bad smell.  Contact a health care provider if:  · Your cyst or abscess returns.  · You have a fever.  · You have more redness, swelling, or pain around your incision.  · You have more fluid or blood coming from your incision.  · Your incision feels warm to the touch.  · You have pus or a bad smell coming from your incision.  Get help right away if:  · You have severe pain or  bleeding.  · You cannot eat or drink without vomiting.  · You have decreased urine output.  · You become short of breath.  · You have chest pain.  · You cough up blood.  · The area where the incision and drainage occurred becomes numb or it tingles.  This information is not intended to replace advice given to you by your health care provider. Make sure you discuss any questions you have with your health care provider.  Document Released: 10/06/2011 Document Revised: 12/14/2015 Document Reviewed: 05/04/2015  Elsevier Interactive Patient Education © 2017 Elsevier Inc.   

## 2016-09-10 ENCOUNTER — Ambulatory Visit (INDEPENDENT_AMBULATORY_CARE_PROVIDER_SITE_OTHER): Payer: Medicare Other | Admitting: General Surgery

## 2016-09-10 ENCOUNTER — Encounter: Payer: Self-pay | Admitting: General Surgery

## 2016-09-10 VITALS — BP 110/68 | HR 73 | Resp 14 | Ht 68.0 in | Wt 179.0 lb

## 2016-09-10 DIAGNOSIS — L723 Sebaceous cyst: Secondary | ICD-10-CM

## 2016-09-10 NOTE — Progress Notes (Signed)
Patient ID: [name] [name], male   DOB: 03-09-1941, 76 y.o.   MRN: [id number]  Chief Complaint  Patient presents with  . Cyst    HPI [name] AlphaHoward Hendrick Edgar Sr. is a 76 y.o. male here today for a evaluation of a back cyst. Patient states he noticed this area about two weeks ago. Patient states they drainage that area at the time of his office visit with Dr. Sherrie MustacheFisher.  with  He has been on 7 days doxycycline finished it last week. He has had some  drainage and pain.  HPI  Past Medical History:  Diagnosis Date  . Arthritis   . Back pain    hx  . Colon polyps   . DM2 (diabetes mellitus, type 2) (HCC)    stable  . GERD (gastroesophageal reflux disease)   . Glaucoma   . Gout   . HLD (hyperlipidemia)   . HTN (hypertension)   . Unstable angina (HCC)    resolved. a. negative myocardial infarction    Past Surgical History:  Procedure Laterality Date  . CORONARY ARTERY BYPASS GRAFT  2004  . KNEE SURGERY      Family History  Problem Relation Age of Onset  . Family history unknown: Yes    Social History Social History  Substance Use Topics  . Smoking status: Former Smoker    Packs/day: 1.00    Years: 4.00    Types: Cigarettes  . Smokeless tobacco: Current User    Types: Chew  . Alcohol use No    Allergies  Allergen Reactions  . Ramipril     Current Outpatient Prescriptions  Medication Sig Dispense Refill  . allopurinol (ZYLOPRIM) 300 MG tablet Take 1 tablet (300 mg total) by mouth daily. 90 tablet 3  . amLODipine-atorvastatin (CADUET) 5-20 MG tablet TAKE 1 TABLET BY MOUTH ONCE A DAY 90 tablet 3  . digoxin (LANOXIN) 0.125 MG tablet Take 1 tablet (125 mcg total) by mouth daily. 90 tablet 3  . ferrous sulfate ([employer]S IRON) 325 (65 FE) MG tablet Take 325 mg by mouth daily with breakfast.    . gabapentin (NEURONTIN) 100 MG capsule TAKE 1 CAPSULE BY MOUTH 3 TIMES DAILY 90 capsule 5  . glipiZIDE (GLUCOTROL XL) 10 MG 24 hr tablet Take 1 tablet (10 mg total) by mouth  daily with breakfast. 90 tablet 0  . glucose blood (ONE TOUCH TEST STRIPS) test strip Check blood sugar q a.m. fasting 100 each 5  . lisinopril (PRINIVIL,ZESTRIL) 20 MG tablet TAKE 1 TABLET BY MOUTH ONCE DAILY 90 tablet 3  . meloxicam (MOBIC) 7.5 MG tablet TAKE 1 TABLET BY MOUTH TWICE (2) DAILY 60 tablet 5  . metFORMIN (GLUCOPHAGE) 1000 MG tablet Take 1 tablet (1,000 mg total) by mouth 2 (two) times daily with a meal. 180 tablet 3  . nitroGLYCERIN (NITROSTAT) 0.4 MG SL tablet Place 1 tablet (0.4 mg total) under the tongue every 5 (five) minutes as needed for chest pain. 25 tablet 6  . ONGLYZA 5 MG TABS tablet TAKE 1 TABLET BY MOUTH ONCE A DAY 90 tablet 3  . empagliflozin (JARDIANCE) 25 MG TABS tablet Take 25 mg by mouth daily. 7 tablet 3   No current facility-administered medications for this visit.     Review of Systems Review of Systems  Constitutional: Negative.   Respiratory: Negative.   Cardiovascular: Negative.     Blood pressure 110/68, pulse 73, resp. rate 14, height 5\' 8"  (1.727 m), weight 179 lb (81.2 kg).  Physical Exam Physical Exam  Constitutional: He is oriented to person, place, and time. He appears well-developed and well-nourished.  Cardiovascular: Normal rate, regular rhythm and normal heart sounds.   Pulmonary/Chest: Effort normal and breath sounds normal.  Neurological: He is alert and oriented to person, place, and time.  Skin: Skin is warm and dry.       Data Reviewed August 22, 2016 culture from the back incision and drainage showed mixed skin flora.  Assessment    Previously inflamed sebaceous cyst of the back.    Plan    Excision was discussed, and this was amenable to the patient who saw and had a similar lesion that recurred after simple incision and drainage.  The area was prepped with alcohol followed by 10 mL of 0.5% Xylocaine with 0.25% Marcaine with 1-200,000 units of epinephrine. The area was cleansed with ChloraPrep and draped. A vertical  incision was made corresponding to the incision and drainage incision just to the left of the spinous process at T6. The 0.5 x 2 cm inflamed area without purulent drainage was excised sharply. Several arterial bleeders required ligation with 3-0 Vicryl suture ligatures. The deep tissue was approximated with interrupted 3-0 Vicryl sutures. The skin was closed with interrupted 4-0 Prolene simple sutures. Telfa and Tegaderm dressing applied.  Patient desires to use Tylenol for postoperative pain relief. Ice pack provided.  He will return in one week for suture removal.  We will have the patient return to the office in one week for a nurse visit.      This information has been scribed by Ples Specter CMA.   Ples Specter 09/10/2016, 10:18 AM

## 2016-09-17 ENCOUNTER — Ambulatory Visit (INDEPENDENT_AMBULATORY_CARE_PROVIDER_SITE_OTHER): Payer: Medicare Other | Admitting: Physician Assistant

## 2016-09-17 ENCOUNTER — Encounter: Payer: Self-pay | Admitting: Physician Assistant

## 2016-09-17 ENCOUNTER — Ambulatory Visit (INDEPENDENT_AMBULATORY_CARE_PROVIDER_SITE_OTHER): Payer: Medicare Other | Admitting: *Deleted

## 2016-09-17 VITALS — BP 150/62 | HR 60 | Temp 98.2°F | Resp 16 | Wt 179.4 lb

## 2016-09-17 DIAGNOSIS — L723 Sebaceous cyst: Secondary | ICD-10-CM

## 2016-09-17 DIAGNOSIS — E118 Type 2 diabetes mellitus with unspecified complications: Secondary | ICD-10-CM | POA: Diagnosis not present

## 2016-09-17 LAB — POCT GLYCOSYLATED HEMOGLOBIN (HGB A1C)
ESTIMATED AVERAGE GLUCOSE: 209
Hemoglobin A1C: 8.9

## 2016-09-17 MED ORDER — GLIPIZIDE ER 10 MG PO TB24: 10.0000 mg | ORAL_TABLET | Freq: Every day | ORAL | 3 refills | Status: DC

## 2016-09-17 MED ORDER — EMPAGLIFLOZIN 25 MG PO TABS: 25.0000 mg | ORAL_TABLET | Freq: Every day | ORAL | 3 refills | Status: DC

## 2016-09-17 NOTE — Patient Instructions (Signed)
Return as needed

## 2016-09-17 NOTE — Progress Notes (Signed)
The sutures were removed . Patient came in today for a wound check.The sutures were removed .   The wound is clean, with no signs of infection noted. Follow up as scheduled.

## 2016-09-17 NOTE — Patient Instructions (Signed)
Type 2 Diabetes Mellitus, Diagnosis, Adult Type 2 diabetes (type 2 diabetes mellitus) is a long-term (chronic) disease. It may be caused by one or both of these problems:  Your body does not make enough of a hormone called insulin.  Your body does not react in a normal way to insulin that it makes. Insulin lets sugars (glucose) go into cells in the body. This gives you energy. If you have type 2 diabetes, sugars cannot get into cells. This causes high blood sugar (hyperglycemia). Your doctor will set treatment goals for you. Generally, you should have these blood sugar levels:  Before meals (preprandial): 80-130 mg/dL (4.4-7.2 mmol/L).  After meals (postprandial): below 180 mg/dL (10 mmol/L).  A1c (hemoglobin A1c) level: less than 7%. Follow these instructions at home: Questions to Ask Your Doctor  You may want to ask these questions:  Do I need to meet with a diabetes educator?  Where can I find a support group for people with diabetes?  What equipment will I need to care for myself at home?  What diabetes medicines do I need? When should I take them?  How often do I need to check my blood sugar?  What number can I call if I have questions?  When is my next doctor's visit? General instructions  Take over-the-counter and prescription medicines only as told by your doctor.  Keep all follow-up visits as told by your doctor. This is important. Contact a doctor if:  Your blood sugar is at or above 240 mg/dL (13.3 mmol/L) for 2 days in a row.  You have been sick or have had a fever for 2 days or more and you are not getting better.  You have any of these problems for more than 6 hours:  You cannot eat or drink.  You feel sick to your stomach (nauseous).  You throw up (vomit).  You have watery poop (diarrhea). Get help right away if:  Your blood sugar is lower than 54 mg/dL (3 mmol/L).  You get confused.  You have trouble:  Thinking clearly.  Breathing.  You  have moderate or large ketone levels in your pee (urine). This information is not intended to replace advice given to you by your health care provider. Make sure you discuss any questions you have with your health care provider. Document Released: 04/22/2008 Document Revised: 12/20/2015 Document Reviewed: 08/17/2015 Elsevier Interactive Patient Education  2017 Elsevier Inc.  

## 2016-09-17 NOTE — Progress Notes (Signed)
Patient: [name] AlphaHoward Hendrick Mcdonell Sr. Male    DOB: 1941/04/04   76 y.o.   MRN: [id number] Visit Date: 09/17/2016  Today's Provider: Margaretann LovelessJennifer M Muhsin Doris, [name]   Chief Complaint  Patient presents with  . Hyperglycemia   Subjective:    HPI  Diabetes : Patient walk in today with c/o sugars levels are high in the last two weeks. He has Diabetes mellitus type 2. Per patient he is having off and on numbness on his face and lips. He reports feeling very tired, weak, sleepy. No energy, very thirsty, lightheaded. No chest pain, leg swelling, or palpitations. Blood sugar readings at home are 190's-180's fasting for the past two weeks.   Lab Results  Component Value Date   HGBA1C 8.9 09/17/2016   HGBA1C 8.2 07/01/2016   HGBA1C 8.5 05/13/2016   When discussing with patient he reports he recently had 2 of his diabetic medications filled, but has not had the other 2 filled in a while and is not sure if he is taking them. He reports he just takes all his medications at once so he isn't sure if they are there.   I called his pharmacist and found that he has not had the jardiance or glipizide filled in a long time.   Allergies  Allergen Reactions  . Ramipril      Current Outpatient Prescriptions:  .  allopurinol (ZYLOPRIM) 300 MG tablet, Take 1 tablet (300 mg total) by mouth daily., Disp: 90 tablet, Rfl: 3 .  amLODipine-atorvastatin (CADUET) 5-20 MG tablet, TAKE 1 TABLET BY MOUTH ONCE A DAY, Disp: 90 tablet, Rfl: 3 .  digoxin (LANOXIN) 0.125 MG tablet, Take 1 tablet (125 mcg total) by mouth daily., Disp: 90 tablet, Rfl: 3 .  empagliflozin (JARDIANCE) 25 MG TABS tablet, Take 25 mg by mouth daily., Disp: 7 tablet, Rfl: 3 .  ferrous sulfate ([employer] IRON) 325 (65 FE) MG tablet, Take 325 mg by mouth daily with breakfast., Disp: , Rfl:  .  gabapentin (NEURONTIN) 100 MG capsule, TAKE 1 CAPSULE BY MOUTH 3 TIMES DAILY, Disp: 90 capsule, Rfl: 5 .  glipiZIDE (GLUCOTROL XL) 10 MG 24 hr tablet, Take 1  tablet (10 mg total) by mouth daily with breakfast., Disp: 90 tablet, Rfl: 0 .  glucose blood (ONE TOUCH TEST STRIPS) test strip, Check blood sugar q a.m. fasting, Disp: 100 each, Rfl: 5 .  lisinopril (PRINIVIL,ZESTRIL) 20 MG tablet, TAKE 1 TABLET BY MOUTH ONCE DAILY, Disp: 90 tablet, Rfl: 3 .  meloxicam (MOBIC) 7.5 MG tablet, TAKE 1 TABLET BY MOUTH TWICE (2) DAILY, Disp: 60 tablet, Rfl: 5 .  metFORMIN (GLUCOPHAGE) 1000 MG tablet, Take 1 tablet (1,000 mg total) by mouth 2 (two) times daily with a meal., Disp: 180 tablet, Rfl: 3 .  nitroGLYCERIN (NITROSTAT) 0.4 MG SL tablet, Place 1 tablet (0.4 mg total) under the tongue every 5 (five) minutes as needed for chest pain., Disp: 25 tablet, Rfl: 6 .  ONGLYZA 5 MG TABS tablet, TAKE 1 TABLET BY MOUTH ONCE A DAY, Disp: 90 tablet, Rfl: 3  Review of Systems  Constitutional: Positive for fatigue. Negative for activity change, appetite change, fever and unexpected weight change.  Eyes: Positive for visual disturbance.  Respiratory: Negative for cough, chest tightness and shortness of breath.   Cardiovascular: Negative for chest pain, palpitations and leg swelling.  Endocrine: Positive for polydipsia.  Neurological: Positive for light-headedness and numbness.    Social History  Substance Use Topics  .  Smoking status: Former Smoker    Packs/day: 1.00    Years: 4.00    Types: Cigarettes  . Smokeless tobacco: Current User    Types: Chew  . Alcohol use No   Objective:   BP (!) 150/62 (BP Location: Right Arm, Patient Position: Sitting, Cuff Size: Normal)   Pulse 60   Temp 98.2 F (36.8 C) (Oral)   Resp 16   Wt 179 lb 6.4 oz (81.4 kg)   SpO2 98%   BMI 27.28 kg/m   Physical Exam  Constitutional: He appears well-developed and well-nourished. No distress.  HENT:  Head: Normocephalic and atraumatic.  Neck: Normal range of motion. Neck supple. No JVD present. No tracheal deviation present. No thyromegaly present.  Cardiovascular: Normal rate,  regular rhythm and normal heart sounds.  Exam reveals no gallop and no friction rub.   No murmur heard. Pulmonary/Chest: Effort normal and breath sounds normal. No respiratory distress. He has no wheezes. He has no rales.  Musculoskeletal: He exhibits edema (1 + pitting edema; no weight change).  Lymphadenopathy:    He has no cervical adenopathy.  Skin: He is not diaphoretic.  Vitals reviewed.      Assessment & Plan:     1. Type 2 diabetes mellitus with complication, without long-term current use of insulin (HCC) A1c up to 8.9. Called Todd at [facility] and confirmed patient has not had glipizide 10mg  ER or jardiance filled. These are both prescribed as below. He is scheduled to see me on 09/29/16. We will keep this follow up and see how his fasting sugars are doing at that time. He is to call if he has any worsening symptoms in the meantime. - POCT glycosylated hemoglobin (Hb A1C) - empagliflozin (JARDIANCE) 25 MG TABS tablet; Take 25 mg by mouth daily.  Dispense: 90 tablet; Refill: 3 - glipiZIDE (GLUCOTROL XL) 10 MG 24 hr tablet; Take 1 tablet (10 mg total) by mouth daily with breakfast.  Dispense: 90 tablet; Refill: 3       Margaretann Loveless, [name]  [facility]

## 2016-09-29 ENCOUNTER — Ambulatory Visit (INDEPENDENT_AMBULATORY_CARE_PROVIDER_SITE_OTHER): Payer: Medicare Other | Admitting: Physician Assistant

## 2016-09-29 ENCOUNTER — Encounter: Payer: Self-pay | Admitting: Physician Assistant

## 2016-09-29 VITALS — BP 136/60 | HR 69 | Temp 97.7°F | Resp 16 | Wt 185.0 lb

## 2016-09-29 DIAGNOSIS — E118 Type 2 diabetes mellitus with unspecified complications: Secondary | ICD-10-CM | POA: Diagnosis not present

## 2016-09-29 DIAGNOSIS — G629 Polyneuropathy, unspecified: Secondary | ICD-10-CM | POA: Diagnosis not present

## 2016-09-29 LAB — POCT GLYCOSYLATED HEMOGLOBIN (HGB A1C): Hemoglobin A1C: 8.5

## 2016-09-29 MED ORDER — GABAPENTIN 300 MG PO CAPS: 300.0000 mg | ORAL_CAPSULE | Freq: Three times a day (TID) | ORAL | 5 refills | Status: DC

## 2016-09-29 NOTE — Progress Notes (Addendum)
Patient: [name] AlphaHoward Hendrick Osentoski Sr. Male    DOB: 05-02-1941   76 y.o.   MRN: [id number] Visit Date: 09/29/2016  Today's Provider: Margaretann LovelessJennifer M Jakhari Space, [name]   Chief Complaint  Patient presents with  . Follow-up    DM   Subjective:    HPI  Diabetes Mellitus Type II, Follow-up:   Lab Results  Component Value Date   HGBA1C 8.5 09/29/2016   HGBA1C 8.9 09/17/2016   HGBA1C 8.2 07/01/2016    Last seen for diabetes 3 months ago. Patient was also seen 2 weeks ago with elevated sugars levels Management since then includes two weeks ago start glipizide and Jardiance. He reports excellent compliance with treatment. He is not having side effects.  Current symptoms include none and have been improving. Home blood sugar records: 130's-100's this morning it was 102 per patient.  Episodes of hypoglycemia? no   Most Recent Eye Exam: needs an eye exam Weight trend: increasing steadily.   Pertinent Labs:    Component Value Date/Time   CHOL 106 03/13/2016 1038   TRIG 129 03/13/2016 1038   TRIG 136 02/28/2008   HDL 34 (L) 03/13/2016 1038   LDLCALC 46 03/13/2016 1038   CREATININE 1.03 03/13/2016 1038    Wt Readings from Last 3 Encounters:  09/29/16 185 lb (83.9 kg)  09/17/16 179 lb 6.4 oz (81.4 kg)  09/10/16 179 lb (81.2 kg)    ------------------------------------------------------------------------     Allergies  Allergen Reactions  . Ramipril      Current Outpatient Prescriptions:  .  allopurinol (ZYLOPRIM) 300 MG tablet, Take 1 tablet (300 mg total) by mouth daily., Disp: 90 tablet, Rfl: 3 .  amLODipine-atorvastatin (CADUET) 5-20 MG tablet, TAKE 1 TABLET BY MOUTH ONCE A DAY, Disp: 90 tablet, Rfl: 3 .  digoxin (LANOXIN) 0.125 MG tablet, Take 1 tablet (125 mcg total) by mouth daily., Disp: 90 tablet, Rfl: 3 .  empagliflozin (JARDIANCE) 25 MG TABS tablet, Take 25 mg by mouth daily., Disp: 90 tablet, Rfl: 3 .  ferrous sulfate ([employer] IRON) 325 (65 FE) MG tablet, Take  325 mg by mouth daily with breakfast., Disp: , Rfl:  .  glipiZIDE (GLUCOTROL XL) 10 MG 24 hr tablet, Take 1 tablet (10 mg total) by mouth daily with breakfast., Disp: 90 tablet, Rfl: 3 .  glucose blood (ONE TOUCH TEST STRIPS) test strip, Check blood sugar q a.m. fasting, Disp: 100 each, Rfl: 5 .  lisinopril (PRINIVIL,ZESTRIL) 20 MG tablet, TAKE 1 TABLET BY MOUTH ONCE DAILY, Disp: 90 tablet, Rfl: 3 .  meloxicam (MOBIC) 7.5 MG tablet, TAKE 1 TABLET BY MOUTH TWICE (2) DAILY, Disp: 60 tablet, Rfl: 5 .  metFORMIN (GLUCOPHAGE) 1000 MG tablet, Take 1 tablet (1,000 mg total) by mouth 2 (two) times daily with a meal., Disp: 180 tablet, Rfl: 3 .  nitroGLYCERIN (NITROSTAT) 0.4 MG SL tablet, Place 1 tablet (0.4 mg total) under the tongue every 5 (five) minutes as needed for chest pain., Disp: 25 tablet, Rfl: 6 .  ONGLYZA 5 MG TABS tablet, TAKE 1 TABLET BY MOUTH ONCE A DAY, Disp: 90 tablet, Rfl: 3 .  gabapentin (NEURONTIN) 300 MG capsule, Take 1 capsule (300 mg total) by mouth 3 (three) times daily., Disp: 90 capsule, Rfl: 5  Review of Systems  Constitutional: Negative for fatigue.  Eyes: Negative for visual disturbance.  Respiratory: Negative.   Cardiovascular: Positive for leg swelling (swelling, right leg last night;went down today). Negative for chest pain and palpitations.  Gastrointestinal: Negative.   Neurological: Negative for dizziness, light-headedness and headaches.  Psychiatric/Behavioral: Negative.     Social History  Substance Use Topics  . Smoking status: Former Smoker    Packs/day: 1.00    Years: 4.00    Types: Cigarettes  . Smokeless tobacco: Current User    Types: Chew  . Alcohol use No   Objective:     Vitals:   09/29/16 0816 09/29/16 0911  BP: (!) 160/60 136/60  Pulse: 69   Resp: 16   Temp: 97.7 F (36.5 C)   TempSrc: Oral   SpO2: 99%   Weight: 185 lb (83.9 kg)      Physical Exam  Constitutional: He appears well-developed and well-nourished. No distress.  HENT:    Head: Normocephalic and atraumatic.  Neck: Normal range of motion. Neck supple. No tracheal deviation present. No thyromegaly present.  Cardiovascular: Normal rate, regular rhythm and normal heart sounds.  Exam reveals no gallop and no friction rub.   No murmur heard. Pulmonary/Chest: Effort normal and breath sounds normal. No respiratory distress. He has no wheezes. He has no rales.  Musculoskeletal: He exhibits no edema.  Lymphadenopathy:    He has no cervical adenopathy.  Skin: He is not diaphoretic.  Vitals reviewed.      Assessment & Plan:     1. Type 2 diabetes mellitus with complication, without long-term current use of insulin (HCC) A1c improved to 8.5 with adding glipizide and jardiance back. He reports feeling 100% better. Will continue current medical treatment plan and f/u in 3 months again.  - POCT glycosylated hemoglobin (Hb A1C)  2. Neuropathy (HCC) Worsening. Sees Dr. Vear Clock in [place] through worker's comp. He is going to call to see him for worsening pain. Will increase gabapentin as below to 300mg  TID prn. I will see him back in 3 months. He is to call if symptoms worsen or if he does not tolerate the increase.  - gabapentin (NEURONTIN) 300 MG capsule; Take 1 capsule (300 mg total) by mouth 3 (three) times daily.  Dispense: 90 capsule; Refill: 5       Margaretann Loveless, [name]  [facility]

## 2016-09-29 NOTE — Patient Instructions (Signed)
Neuropathic Pain Neuropathic pain is pain caused by damage to the nerves that are responsible for certain sensations in your body (sensory nerves). The pain can be caused by damage to:  The sensory nerves that send signals to your spinal cord and brain (peripheral nervous system).  The sensory nerves in your brain or spinal cord (central nervous system). Neuropathic pain can make you more sensitive to pain. What would be a minor sensation for most people may feel very painful if you have neuropathic pain. This is usually a long-term condition that can be difficult to treat. The type of pain can differ from person to person. It may start suddenly (acute), or it may develop slowly and last for a long time (chronic). Neuropathic pain may come and go as damaged nerves heal or may stay at the same level for years. It often causes emotional distress, loss of sleep, and a lower quality of life. What are the causes? The most common cause of damage to a sensory nerve is diabetes. Many other diseases and conditions can also cause neuropathic pain. Causes of neuropathic pain can be classified as:  Toxic. Many drugs and chemicals can cause toxic damage. The most common cause of toxic neuropathic pain is damage from drug treatment for cancer (chemotherapy).  Metabolic. This type of pain can happen when a disease causes imbalances that damage nerves. Diabetes is the most common of these diseases. Vitamin B deficiency caused by long-term alcohol abuse is another common cause.  Traumatic. Any injury that cuts, crushes, or stretches a nerve can cause damage and pain. A common example is feeling pain after losing an arm or leg (phantom limb pain).  Compression-related. If a sensory nerve gets trapped or compressed for a long period of time, the blood supply to the nerve can be cut off.  Vascular. Many blood vessel diseases can cause neuropathic pain by decreasing blood supply and oxygen to nerves.  Autoimmune.  This type of pain results from diseases in which the body's defense system mistakenly attacks sensory nerves. Examples of autoimmune diseases that can cause neuropathic pain include lupus and multiple sclerosis.  Infectious. Many types of viral infections can damage sensory nerves and cause pain. Shingles infection is a common cause of this type of pain.  Inherited. Neuropathic pain can be a symptom of many diseases that are passed down through families (genetic). What are the signs or symptoms? The main symptom is pain. Neuropathic pain is often described as:  Burning.  Shock-like.  Stinging.  Hot or cold.  Itching. How is this diagnosed? No single test can diagnose neuropathic pain. Your health care provider will do a physical exam and ask you about your pain. You may use a pain scale to describe how bad your pain is. You may also have tests to see if you have a high sensitivity to pain and to help find the cause and location of any sensory nerve damage. These tests may include:  Imaging studies, such as:  X-rays.  CT scan.  MRI.  Nerve conduction studies to test how well nerve signals travel through your sensory nerves (electrodiagnostic testing).  Stimulating your sensory nerves through electrodes on your skin and measuring the response in your spinal cord and brain (somatosensory evoked potentials). How is this treated? Treatment for neuropathic pain may change over time. You may need to try different treatment options or a combination of treatments. Some options include:  Over-the-counter pain relievers.  Prescription medicines. Some medicines used to treat other   conditions may also help neuropathic pain. These include medicines to:  Control seizures (anticonvulsants).  Relieve depression (antidepressants).  Prescription-strength pain relievers (narcotics). These are usually used when other pain relievers do not help.  Transcutaneous nerve stimulation (TENS). This  uses electrical currents to block painful nerve signals. The treatment is painless.  Topical and local anesthetics. These are medicines that numb the nerves. They can be injected as a nerve block or applied to the skin.  Alternative treatments, such as:  Acupuncture.  Meditation.  Massage.  Physical therapy.  Pain management programs.  Counseling. Follow these instructions at home:  Learn as much as you can about your condition.  Take medicines only as directed by your health care provider.  Work closely with all your health care providers to find what works best for you.  Have a good support system at home.  Consider joining a chronic pain support group. Contact a health care provider if:  Your pain treatments are not helping.  You are having side effects from your medicines.  You are struggling with fatigue, mood changes, depression, or anxiety. This information is not intended to replace advice given to you by your health care provider. Make sure you discuss any questions you have with your health care provider. Document Released: 04/10/2004 Document Revised: 02/01/2016 Document Reviewed: 12/22/2013 Elsevier Interactive Patient Education  2017 [employer]vier Inc.  

## 2016-12-23 ENCOUNTER — Other Ambulatory Visit: Payer: Self-pay | Admitting: Physician Assistant

## 2016-12-23 DIAGNOSIS — E119 Type 2 diabetes mellitus without complications: Secondary | ICD-10-CM

## 2016-12-23 NOTE — Telephone Encounter (Signed)
LOV 09/29/2016. A1C was improved at 8.5% at that time.

## 2016-12-28 NOTE — Progress Notes (Deleted)
Cardiology Office Note  Date:  12/28/2016   ID:  [name] AlphaHoward Hendrick Cleavenger Sr., DOB 1941-02-15, MRN [id number]  PCP:  Margaretann LovelessBurnette, Jennifer M, [name]   No chief complaint on file.   HPI:  [name] is a very pleasant 76 year old gentleman with  coronary artery disease,  bypass surgery in 2004,  occluded graft to the diagonal branch with 3 other patent grafts to the RCA, OM with a LIMA to the LAD by catheterization in 2007  who presents for routine followup Of his coronary artery disease .   In follow-up today he reports that he is doing well, denies any chest pain or anginal symptoms Very active at baseline though no regular exercise program "Chops wood " frequently   Right leg pain 3 weeks ago, "neuropathy" He was told  Tingling, numb Had a cortisone by Dr. 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  Chronic back pain. Previous cortisone shots in his back    PMH:   has a past medical history of Arthritis; Back pain; Colon polyps; DM2 (diabetes mellitus, type 2) (HCC); GERD (gastroesophageal reflux disease); Glaucoma; Gout; HLD (hyperlipidemia); HTN (hypertension); and Unstable angina (HCC).  PSH:    Past Surgical History:  Procedure Laterality Date  . CORONARY ARTERY BYPASS GRAFT  2004  . KNEE SURGERY      Current Outpatient Prescriptions  Medication Sig Dispense Refill  . allopurinol (ZYLOPRIM) 300 MG tablet Take 1 tablet (300 mg total) by mouth daily. 90 tablet 3  .  amLODipine-atorvastatin (CADUET) 5-20 MG tablet TAKE 1 TABLET BY MOUTH ONCE A DAY 90 tablet 3  . digoxin (LANOXIN) 0.125 MG tablet Take 1 tablet (125 mcg total) by mouth daily. 90 tablet 3  . empagliflozin (JARDIANCE) 25 MG TABS tablet Take 25 mg by mouth daily. 90 tablet 3  . ferrous sulfate ([employer] IRON) 325 (65 FE) MG tablet Take 325 mg by mouth daily with breakfast.    . gabapentin (NEURONTIN) 300 MG capsule Take 1 capsule (300 mg total) by mouth 3 (three) times daily. 90 capsule 5  . glipiZIDE (GLUCOTROL XL) 10 MG 24 hr tablet Take 1 tablet (10 mg total) by mouth daily with breakfast. 90 tablet 3  . glucose blood (ONE TOUCH TEST STRIPS) test strip Check blood sugar q a.m. fasting 100 each 5  . lisinopril (PRINIVIL,ZESTRIL) 20 MG tablet TAKE 1 TABLET BY MOUTH ONCE DAILY 90 tablet 3  . meloxicam (MOBIC) 7.5 MG tablet TAKE 1 TABLET BY MOUTH TWICE (2) DAILY 60 tablet 5  . metFORMIN (GLUCOPHAGE) 1000 MG tablet Take 1 tablet (1,000 mg total) by mouth 2 (two) times daily with a meal. 180 tablet 3  . nitroGLYCERIN (NITROSTAT) 0.4 MG SL tablet Place 1 tablet (0.4 mg total) under the tongue every 5 (five) minutes as needed for chest pain. 25 tablet 6  . ONGLYZA 5 MG TABS tablet TAKE 1 TABLET BY MOUTH ONCE DAILY 90 tablet 1   No current facility-administered medications for this visit.  Allergies:   Ramipril   Social History:  The patient  reports that he has quit smoking. His smoking use included Cigarettes. He has a 4.00 pack-year smoking history. His smokeless tobacco use includes Chew. He reports that he does not drink alcohol or use drugs.   Family History:   Family history is unknown by patient.    Review of Systems: Review of Systems  Constitutional: Negative.   Respiratory: Negative.   Cardiovascular: Negative.   Gastrointestinal: Negative.   Musculoskeletal: Positive for back pain.       Right leg numbness  Neurological: Negative.   Psychiatric/Behavioral: Negative.   All other  systems reviewed and are negative.    PHYSICAL EXAM: VS:  There were no vitals taken for this visit. , BMI There is no height or weight on file to calculate BMI. GEN: Well nourished, well developed, in no acute distress  HEENT: normal  Neck: no JVD, carotid bruits, or masses Cardiac: RRR; no murmurs, rubs, or gallops,no edema  Respiratory:  clear to auscultation bilaterally, normal work of breathing GI: soft, nontender, nondistended, + BS MS: no deformity or atrophy  Skin: warm and dry, no rash Neuro:  Strength and sensation are intact Psych: euthymic mood, full affect    Recent Labs: 03/13/2016: ALT 19; BUN 11; Creatinine, Ser 1.03; Platelets 226; Potassium 4.6; Sodium 139; TSH 2.810    Lipid Panel Lab Results  Component Value Date   CHOL 106 03/13/2016   HDL 34 (L) 03/13/2016   LDLCALC 46 03/13/2016   TRIG 129 03/13/2016      Wt Readings from Last 3 Encounters:  09/29/16 185 lb (83.9 kg)  09/17/16 179 lb 6.4 oz (81.4 kg)  09/10/16 179 lb (81.2 kg)       ASSESSMENT AND PLAN:  Mixed hyperlipidemia Cholesterol is at goal on the current lipid regimen. No changes to the medications were made.  Essential hypertension - Plan: EKG 12-Lead Blood pressure is well controlled on today's visit. No changes made to the medications.  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 No further workup at this time. Continue current medication regimen.  Type 2 diabetes mellitus with complication, without long-term current use of insulin (HCC) Hemoglobin A1c is elevated Also with recent cortisone shot Recommended strict diet, dietary guide provided   Total encounter time more than 15 minutes  Greater than 50% was spent in counseling and coordination of care with the patient   Disposition:   F/U  6 months   No orders of the defined types were placed in this  encounter.    Signed, Dossie Arbour, M.D., Ph.D. 12/28/2016  [facility] [place], [place] [phone]

## 2016-12-29 ENCOUNTER — Ambulatory Visit (INDEPENDENT_AMBULATORY_CARE_PROVIDER_SITE_OTHER): Payer: Medicare Other | Admitting: Physician Assistant

## 2016-12-29 ENCOUNTER — Encounter: Payer: Self-pay | Admitting: Physician Assistant

## 2016-12-29 VITALS — BP 126/60 | HR 68 | Temp 97.9°F | Resp 16 | Wt 183.0 lb

## 2016-12-29 DIAGNOSIS — E118 Type 2 diabetes mellitus with unspecified complications: Secondary | ICD-10-CM

## 2016-12-29 DIAGNOSIS — I10 Essential (primary) hypertension: Secondary | ICD-10-CM | POA: Diagnosis not present

## 2016-12-29 LAB — POCT GLYCOSYLATED HEMOGLOBIN (HGB A1C)
Est. average glucose Bld gHb Est-mCnc: 192
Hemoglobin A1C: 8.3

## 2016-12-29 NOTE — Patient Instructions (Signed)

## 2016-12-29 NOTE — Progress Notes (Signed)
Patient: [name] AlphaHoward Hendrick Kaeding Sr. Male    DOB: 07/27/1941   76 y.o.   MRN: [id number] Visit Date: 12/29/2016  Today's Provider: Margaretann LovelessJennifer M Xochil Shanker, [name]   Chief Complaint  Patient presents with  . Diabetes  . Hypertension   Subjective:    HPI  Diabetes Mellitus Type II, Follow-up:   Lab Results  Component Value Date   HGBA1C 8.3 12/29/2016   HGBA1C 8.5 09/29/2016   HGBA1C 8.9 09/17/2016    Last seen for diabetes 3 months ago.  Management since then includes no changes. He reports excellent compliance with treatment. He is not having side effects.  Current symptoms include paresthesia of the feet and have been stable. Home blood sugar records: fasting range: 140 this morning  Episodes of hypoglycemia? yes - 2 weeks ago (51)   Current Insulin Regimen:  Most Recent Eye Exam: several years ago Weight trend: stable Prior visit with dietician: no Current diet: in general, a "healthy" diet   Current exercise: yard work  Pertinent Labs:    Component Value Date/Time   CHOL 106 03/13/2016 1038   TRIG 129 03/13/2016 1038   TRIG 136 02/28/2008   HDL 34 (L) 03/13/2016 1038   LDLCALC 46 03/13/2016 1038   CREATININE 1.03 03/13/2016 1038    Wt Readings from Last 3 Encounters:  12/29/16 183 lb (83 kg)  09/29/16 185 lb (83.9 kg)  09/17/16 179 lb 6.4 oz (81.4 kg)    ------------------------------------------------------------------------   Hypertension, follow-up:  BP Readings from Last 3 Encounters:  12/29/16 126/60  09/29/16 136/60  09/17/16 (!) 150/62    He was last seen for hypertension 3 months ago.  BP at that visit was 136/60. Management changes since that visit include no changes. He reports excellent compliance with treatment. He is not having side effects.  He is exercising. He is adherent to low salt diet.   Outside blood pressures are not being checked. He is experiencing lower extremity edema.  Patient denies chest pain.     Cardiovascular risk factors include advanced age (older than 8255 for men, 2165 for women), diabetes mellitus and hypertension.  Use of agents associated with hypertension: none.     Weight trend: stable Wt Readings from Last 3 Encounters:  12/29/16 183 lb (83 kg)  09/29/16 185 lb (83.9 kg)  09/17/16 179 lb 6.4 oz (81.4 kg)    Current diet: in general, a "healthy" diet   ------------------------------------------------------------------------    Allergies  Allergen Reactions  . Ramipril      Current Outpatient Prescriptions:  .  allopurinol (ZYLOPRIM) 300 MG tablet, Take 1 tablet (300 mg total) by mouth daily., Disp: 90 tablet, Rfl: 3 .  amLODipine-atorvastatin (CADUET) 5-20 MG tablet, TAKE 1 TABLET BY MOUTH ONCE A DAY, Disp: 90 tablet, Rfl: 3 .  digoxin (LANOXIN) 0.125 MG tablet, Take 1 tablet (125 mcg total) by mouth daily., Disp: 90 tablet, Rfl: 3 .  empagliflozin (JARDIANCE) 25 MG TABS tablet, Take 25 mg by mouth daily., Disp: 90 tablet, Rfl: 3 .  ferrous sulfate ([employer] IRON) 325 (65 FE) MG tablet, Take 325 mg by mouth daily with breakfast., Disp: , Rfl:  .  gabapentin (NEURONTIN) 300 MG capsule, Take 1 capsule (300 mg total) by mouth 3 (three) times daily., Disp: 90 capsule, Rfl: 5 .  glipiZIDE (GLUCOTROL XL) 10 MG 24 hr tablet, Take 1 tablet (10 mg total) by mouth daily with breakfast., Disp: 90 tablet, Rfl: 3 .  glucose blood (  ONE TOUCH TEST STRIPS) test strip, Check blood sugar q a.m. fasting, Disp: 100 each, Rfl: 5 .  lisinopril (PRINIVIL,ZESTRIL) 20 MG tablet, TAKE 1 TABLET BY MOUTH ONCE DAILY, Disp: 90 tablet, Rfl: 3 .  meloxicam (MOBIC) 7.5 MG tablet, TAKE 1 TABLET BY MOUTH TWICE (2) DAILY, Disp: 60 tablet, Rfl: 5 .  metFORMIN (GLUCOPHAGE) 1000 MG tablet, Take 1 tablet (1,000 mg total) by mouth 2 (two) times daily with a meal., Disp: 180 tablet, Rfl: 3 .  nitroGLYCERIN (NITROSTAT) 0.4 MG SL tablet, Place 1 tablet (0.4 mg total) under the tongue every 5 (five) minutes as  needed for chest pain., Disp: 25 tablet, Rfl: 6 .  ONGLYZA 5 MG TABS tablet, TAKE 1 TABLET BY MOUTH ONCE DAILY, Disp: 90 tablet, Rfl: 1  Review of Systems  Constitutional: Negative.   HENT: Negative.   Respiratory: Negative.   Cardiovascular: Negative.   Endocrine: Negative.   Neurological: Positive for numbness.    Social History  Substance Use Topics  . Smoking status: Former Smoker    Packs/day: 1.00    Years: 4.00    Types: Cigarettes  . Smokeless tobacco: Current User    Types: Chew  . Alcohol use No   Objective:   BP 126/60 (BP Location: Left Arm, Patient Position: Sitting, Cuff Size: Large)   Pulse 68   Temp 97.9 F (36.6 C) (Oral)   Resp 16   Wt 183 lb (83 kg)   BMI 27.83 kg/m  Vitals:   12/29/16 0826  BP: 126/60  Pulse: 68  Resp: 16  Temp: 97.9 F (36.6 C)  TempSrc: Oral  Weight: 183 lb (83 kg)     Physical Exam  Constitutional: He appears well-developed and well-nourished. No distress.  HENT:  Head: Normocephalic and atraumatic.  Neck: Normal range of motion. Neck supple. No tracheal deviation present. No thyromegaly present.  Cardiovascular: Normal rate, regular rhythm and normal heart sounds.  Exam reveals no gallop and no friction rub.   No murmur heard. Pulmonary/Chest: Effort normal and breath sounds normal. No respiratory distress. He has no wheezes. He has no rales.  Lymphadenopathy:    He has no cervical adenopathy.  Skin: He is not diaphoretic.  Vitals reviewed.      Assessment & Plan:     1. Type 2 diabetes mellitus with complication, without long-term current use of insulin (HCC) A1c improved to 8.3 from 8.5. Continue onglyza 5mg , metformin 1000mg  BID, glipizide 10mg , and jardiance 25mg . I will see him back in 3 months for AWV/CPE and all labs. - POCT glycosylated hemoglobin (Hb A1C)  2. Essential hypertension Stable. Continue lisinopril 20mg , amlodipine-atorvastatin 5-20mg .        Margaretann Loveless, [name]  [facility]

## 2016-12-30 ENCOUNTER — Encounter: Payer: Self-pay | Admitting: Cardiovascular Disease

## 2016-12-30 ENCOUNTER — Ambulatory Visit: Payer: Medicare Other | Admitting: Cardiovascular Disease

## 2017-01-22 ENCOUNTER — Other Ambulatory Visit: Payer: Self-pay | Admitting: Physician Assistant

## 2017-01-22 ENCOUNTER — Other Ambulatory Visit: Payer: Self-pay | Admitting: *Deleted

## 2017-01-22 DIAGNOSIS — I482 Chronic atrial fibrillation, unspecified: Secondary | ICD-10-CM

## 2017-01-22 MED ORDER — AMLODIPINE-ATORVASTATIN 5-20 MG PO TABS: 1.0000 | ORAL_TABLET | Freq: Every day | ORAL | 2 refills | Status: DC

## 2017-01-31 ENCOUNTER — Other Ambulatory Visit: Payer: Self-pay | Admitting: Physician Assistant

## 2017-01-31 DIAGNOSIS — E119 Type 2 diabetes mellitus without complications: Secondary | ICD-10-CM

## 2017-02-05 NOTE — Progress Notes (Signed)
Cardiology Office Note  Date:  02/06/2017   ID:  [name] AlphaHoward Hendrick Caspers Sr., DOB 12-05-1940, MRN [id number]  PCP:  [name]   Chief Complaint  Patient presents with  . other    6 month f/u no complaints today. Meds reviewed verbally with pt.    HPI:  [name] is a very pleasant 76 year old gentleman with  coronary artery disease,  bypass surgery in 2004, occluded graft to the diagonal branch with 3 other patent grafts to the RCA, OM with a LIMA to the LAD by catheterization in 2007  who presents for routine followup Of his coronary artery disease .   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  Chronic back pain. Previous cortisone shots in his back    PMH:   has a past medical history of Arthritis; Back pain; Colon polyps; DM2 (diabetes mellitus, type 2) (HCC); GERD (gastroesophageal reflux disease); Glaucoma; Gout; HLD (hyperlipidemia); HTN (hypertension); Neuropathy; and Unstable angina (HCC).  PSH:    Past Surgical History:  Procedure Laterality Date  . CORONARY ARTERY BYPASS GRAFT  2004  . KNEE SURGERY      Current Outpatient Prescriptions  Medication Sig Dispense Refill  . allopurinol (ZYLOPRIM) 300 MG tablet Take 1 tablet (300 mg total) by mouth daily. 90 tablet 3  . digoxin (LANOXIN) 0.125 MG tablet TAKE 1 TABLET BY MOUTH DAILY 90 tablet 1  .  empagliflozin (JARDIANCE) 25 MG TABS tablet Take 25 mg by mouth daily. 90 tablet 3  . ferrous sulfate ([employer] IRON) 325 (65 FE) MG tablet Take 325 mg by mouth daily with breakfast.    . gabapentin (NEURONTIN) 300 MG capsule Take 1 capsule (300 mg total) by mouth 3 (three) times daily. 90 capsule 5  . glipiZIDE (GLUCOTROL XL) 10 MG 24 hr tablet Take 1 tablet (10 mg total) by mouth daily with breakfast. 90 tablet 3  . glucose blood (ONE TOUCH TEST STRIPS) test strip Check blood sugar q a.m. fasting 100 each 5  . lisinopril (PRINIVIL,ZESTRIL) 20 MG tablet TAKE 1 TABLET BY MOUTH ONCE DAILY 90 tablet 3  . meloxicam (MOBIC) 7.5 MG tablet TAKE 1 TABLET BY MOUTH TWICE (2) DAILY 60 tablet 5  . metFORMIN (GLUCOPHAGE) 1000 MG tablet TAKE 1 TABLET BY MOUTH TWICE A DAY WITH A MEAL 180 tablet 3  . nitroGLYCERIN (NITROSTAT) 0.4 MG SL tablet Place 1 tablet (0.4 mg total) under the tongue every 5 (five) minutes as needed for chest pain. 25 tablet 6  . ONGLYZA 5 MG TABS tablet TAKE 1 TABLET BY MOUTH ONCE DAILY 90 tablet 1  . atorvastatin (LIPITOR) 20 MG tablet Take 1 tablet (20 mg total) by mouth daily. 90 tablet 3  . hydrochlorothiazide (MICROZIDE) 12.5 MG capsule Take 1 capsule (12.5 mg total) by mouth daily as needed. 90 capsule 3   No  current facility-administered medications for this visit.      Allergies:   Ramipril   Social History:  The patient  reports that he has quit smoking. His smoking use included Cigarettes. He has a 4.00 pack-year smoking history. His smokeless tobacco use includes Chew. He reports that he does not drink alcohol or use drugs.   Family History:   Family history is unknown by patient.    Review of Systems: Review of Systems  Constitutional: Negative.   Respiratory: Negative.   Cardiovascular: Negative.   Gastrointestinal: Negative.   Musculoskeletal: Positive for back pain.       Right leg numbness  Neurological: Negative.   Psychiatric/Behavioral: Negative.   All other  systems reviewed and are negative.    PHYSICAL EXAM: VS:  BP 126/60 (BP Location: Left Arm, Patient Position: Sitting, Cuff Size: Normal)   Pulse 67   Ht 5\' 10"  (1.778 m)   Wt 180 lb 12 oz (82 kg)   BMI 25.93 kg/m  , BMI Body mass index is 25.93 kg/m. GEN: Well nourished, well developed, in no acute distress  HEENT: normal  Neck: no JVD, carotid bruits, or masses Cardiac: RRR; no murmurs, rubs, or gallops,no edema  Respiratory:  clear to auscultation bilaterally, normal work of breathing GI: soft, nontender, nondistended, + BS MS: no deformity or atrophy  Skin: warm and dry, no rash Neuro:  Strength and sensation are intact Psych: euthymic mood, full affect    Recent Labs: 03/13/2016: ALT 19; BUN 11; Creatinine, Ser 1.03; Hemoglobin 12.8; Platelets 226; Potassium 4.6; Sodium 139; TSH 2.810    Lipid Panel Lab Results  Component Value Date   CHOL 106 03/13/2016   HDL 34 (L) 03/13/2016   LDLCALC 46 03/13/2016   TRIG 129 03/13/2016      Wt Readings from Last 3 Encounters:  02/06/17 180 lb 12 oz (82 kg)  12/29/16 183 lb (83 kg)  09/29/16 185 lb (83.9 kg)       ASSESSMENT AND PLAN:  Mixed hyperlipidemia Recommended he stop Caduet We will send a Lipitor 20 mg daily  Leg swelling Suspect component of venous insufficiency Recommended he take HCTZ 12.5 mill grams daily We will stop her amlodipine, possible side effect causing leg swelling Very high fluid intake at baseline  Essential hypertension - Plan: EKG 12-Lead As above, stop amlodipine, start low-dose HCTZ  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 No further workup at this time. Continue current medication regimen.  Type 2 diabetes mellitus with complication, without long-term current use of insulin (HCC) Hemoglobin A1c is elevated Recommended strict diet, dietary guide provided   Total encounter  time more than 25 minutes  Greater than 50% was spent in counseling and coordination of care with the patient   Disposition:   F/U  12 months   Orders Placed This Encounter  Procedures  . EKG 12-Lead     Signed, Dossie Arbour, M.D., Ph.D. 02/06/2017  [facility] [place], [place] [phone]

## 2017-02-06 ENCOUNTER — Ambulatory Visit (INDEPENDENT_AMBULATORY_CARE_PROVIDER_SITE_OTHER): Payer: Medicare Other | Admitting: Cardiovascular Disease

## 2017-02-06 ENCOUNTER — Encounter: Payer: Self-pay | Admitting: Cardiovascular Disease

## 2017-02-06 VITALS — BP 126/60 | HR 67 | Ht 70.0 in | Wt 180.8 lb

## 2017-02-06 DIAGNOSIS — E118 Type 2 diabetes mellitus with unspecified complications: Secondary | ICD-10-CM | POA: Diagnosis not present

## 2017-02-06 DIAGNOSIS — I25718 Atherosclerosis of autologous vein coronary artery bypass graft(s) with other forms of angina pectoris: Secondary | ICD-10-CM

## 2017-02-06 DIAGNOSIS — I10 Essential (primary) hypertension: Secondary | ICD-10-CM

## 2017-02-06 DIAGNOSIS — E782 Mixed hyperlipidemia: Secondary | ICD-10-CM | POA: Diagnosis not present

## 2017-02-06 MED ORDER — ATORVASTATIN CALCIUM 20 MG PO TABS: 20.0000 mg | ORAL_TABLET | Freq: Every day | ORAL | 3 refills | Status: DC

## 2017-02-06 MED ORDER — HYDROCHLOROTHIAZIDE 12.5 MG PO CAPS: 12.5000 mg | ORAL_CAPSULE | Freq: Every day | ORAL | 3 refills | Status: DC | PRN

## 2017-02-06 NOTE — Patient Instructions (Addendum)
Medication Instructions:   Please finish the caduet (amlodipine/atorvastatin)  When you run out, Start the atorvastatin alone Also start HCTZ 12.5 once a day as needed for ankle swelling  Monitor your blood pressure after you come off the amlodipine  Labwork:  No new labs needed  Testing/Procedures:  No further testing at this time   Follow-Up: It was a pleasure seeing you in the office today. Please call us if you have new issues that need to be addressed before your next appt.  [phone]  Your physician wants you to follow-up in: 6 months.  You will receive a reminder letter in the mail two months in advance. If you don't receive a letter, please call our office to schedule the follow-up appointment.  If you need a refill on your cardiac medications before your next appointment, please call your pharmacy.

## 2017-02-10 ENCOUNTER — Other Ambulatory Visit: Payer: Self-pay | Admitting: Physician Assistant

## 2017-02-10 DIAGNOSIS — M2550 Pain in unspecified joint: Secondary | ICD-10-CM

## 2017-03-17 ENCOUNTER — Ambulatory Visit (INDEPENDENT_AMBULATORY_CARE_PROVIDER_SITE_OTHER): Payer: Medicare Other

## 2017-03-17 VITALS — BP 146/76 | HR 68 | Temp 99.0°F | Ht 70.0 in | Wt 181.8 lb

## 2017-03-17 DIAGNOSIS — Z23 Encounter for immunization: Secondary | ICD-10-CM

## 2017-03-17 DIAGNOSIS — Z0000 Encounter for general adult medical examination without abnormal findings: Secondary | ICD-10-CM

## 2017-03-17 NOTE — Progress Notes (Signed)
Subjective:   [name] Flavin Velarde Sr. is a 76 y.o. male who presents for Medicare Annual/Subsequent preventive examination.  Review of Systems:  N/A  Cardiac Risk Factors include: advanced age (>20men, >26 women);diabetes mellitus;dyslipidemia;hypertension;male gender     Objective:    Vitals: BP (!) 146/76 (BP Location: Left Arm)   Pulse 68   Temp 99 F (37.2 C) (Oral)   Ht 5\' 10"  (1.778 m)   Wt 181 lb 12.8 oz (82.5 kg)   BMI 26.09 kg/m   Body mass index is 26.09 kg/m.  Tobacco History  Smoking Status  . Former Smoker  . Packs/day: 1.00  . Years: 4.00  . Types: Cigarettes  Smokeless Tobacco  . Current User  . Types: Chew     Ready to quit: Not Answered Counseling given: Not Answered   Past Medical History:  Diagnosis Date  . Arthritis   . Back pain    hx  . Colon polyps   . DM2 (diabetes mellitus, type 2) (HCC)    stable  . GERD (gastroesophageal reflux disease)   . Glaucoma   . Gout   . HLD (hyperlipidemia)   . HTN (hypertension)   . Neuropathy   . Unstable angina (HCC)    resolved. a. negative myocardial infarction   Past Surgical History:  Procedure Laterality Date  . CORONARY ARTERY BYPASS GRAFT  2004  . KNEE SURGERY     Family History  Problem Relation Age of Onset  . Family history unknown: Yes   History  Sexual Activity  . Sexual activity: Not on file    Outpatient Encounter Prescriptions as of 03/17/2017  Medication Sig  . allopurinol (ZYLOPRIM) 300 MG tablet Take 1 tablet (300 mg total) by mouth daily.  Marland Kitchen atorvastatin (LIPITOR) 20 MG tablet Take 1 tablet (20 mg total) by mouth daily.  . digoxin (LANOXIN) 0.125 MG tablet TAKE 1 TABLET BY MOUTH DAILY  . empagliflozin (JARDIANCE) 25 MG TABS tablet Take 25 mg by mouth daily.  . ferrous sulfate ([employer]S IRON) 325 (65 FE) MG tablet Take 325 mg by mouth.   . gabapentin (NEURONTIN) 300 MG capsule Take 1 capsule (300 mg total) by mouth 3 (three) times daily.  Marland Kitchen glipiZIDE (GLUCOTROL XL) 10  MG 24 hr tablet Take 1 tablet (10 mg total) by mouth daily with breakfast.  . glucose blood (ONE TOUCH TEST STRIPS) test strip Check blood sugar q a.m. fasting  . hydrochlorothiazide (MICROZIDE) 12.5 MG capsule Take 1 capsule (12.5 mg total) by mouth daily as needed.  Marland Kitchen lisinopril (PRINIVIL,ZESTRIL) 20 MG tablet TAKE 1 TABLET BY MOUTH ONCE DAILY  . meloxicam (MOBIC) 7.5 MG tablet TAKE 1 TABLET BY MOUTH TWICE (2) DAILY  . metFORMIN (GLUCOPHAGE) 1000 MG tablet TAKE 1 TABLET BY MOUTH TWICE A DAY WITH A MEAL  . nitroGLYCERIN (NITROSTAT) 0.4 MG SL tablet Place 1 tablet (0.4 mg total) under the tongue every 5 (five) minutes as needed for chest pain.  . ONGLYZA 5 MG TABS tablet TAKE 1 TABLET BY MOUTH ONCE DAILY   No facility-administered encounter medications on file as of 03/17/2017.     Activities of Daily Living In your present state of health, do you have any difficulty performing the following activities: 03/17/2017  Hearing? Y  Vision? N  Difficulty concentrating or making decisions? Y  Comment occasionally  Walking or climbing stairs? N  Dressing or bathing? N  Doing errands, shopping? N  Preparing Food and eating ? N  Using the Toilet?  N  In the past six months, have you accidently leaked urine? N  Do you have problems with loss of bowel control? N  Managing your Medications? N  Managing your Finances? N  Housekeeping or managing your Housekeeping? N  Some recent data might be hidden    Patient Care Team: Margaretann Loveless, [name] as PCP - General (Family Medicine) Antonieta Iba, MD as Consulting Physician (Cardiology) Maryella Shivers as Physician Assistant (Physician Assistant) Lemar Livings Merrily Pew, MD (General Surgery)   Assessment:     Exercise Activities and Dietary recommendations Current Exercise Habits: The patient does not participate in regular exercise at present (patient is very active and works on [place] acres)  Goals    . Increase water intake           Recommend increasing water intake to 6-8 glasses a day.       Fall Risk Fall Risk  03/17/2017 03/13/2016  Falls in the past year? No No   Depression Screen PHQ 2/9 Scores 03/17/2017 03/13/2016  PHQ - 2 Score 0 0    Cognitive Function     6CIT Screen 03/17/2017  What Year? 0 points  What month? 0 points  What time? 0 points  Count back from 20 0 points  Months in reverse (No Data)  Repeat phrase 2 points    Immunization History  Administered Date(s) Administered  . Influenza, High Dose Seasonal PF 03/17/2017  . Influenza,inj,Quad PF,6+ Mos 05/05/2016  . Pneumococcal Conjugate-13 03/13/2016  . Pneumococcal Polysaccharide-23 03/17/2017   Screening Tests Health Maintenance  Topic Date Due  . FOOT EXAM  11/20/1949  . INFLUENZA VACCINE  02/25/2017  . PNA vac Low Risk Adult (2 of 2 - PPSV23) 03/13/2017  . OPHTHALMOLOGY EXAM  02/25/2018 (Originally 11/20/1949)  . TETANUS/TDAP  02/25/2018 (Originally [birth year]20/1958)  . HEMOGLOBIN A1C  06/30/2017      Plan:  I have personally reviewed and addressed the Medicare Annual Wellness questionnaire and have noted the following in the patient's chart:  A. Medical and social history B. Use of alcohol, tobacco or illicit drugs  C. Current medications and supplements D. Functional ability and status E.  Nutritional status F.  Physical activity G. Advance directives H. List of other physicians I.  Hospitalizations, surgeries, and ER visits in previous 12 months J.  Vitals K. Screenings such as hearing and vision if needed, cognitive and depression L. Referrals and appointments - none  In addition, I have reviewed and discussed with patient certain preventive protocols, quality metrics, and best practice recommendations. A written personalized care plan for preventive services as well as general preventive health recommendations were provided to patient.  See attached scanned questionnaire for additional information.   Signed,  Hyacinth Meeker, LPN Nurse Health Advisor   MD Recommendations: Pt needs a diabetic foot exam and next OV. Pt declined tetanus today, states he had this done previously. Pt to get records and then let us know when.

## 2017-03-17 NOTE — Patient Instructions (Signed)
[name] , Thank you for taking time to come for your Medicare Wellness Visit. I appreciate your ongoing commitment to your health goals. Please review the following plan we discussed and let me know if I can assist you in the future.   Screening recommendations/referrals: Colonoscopy: no longer required Recommended yearly ophthalmology/optometry visit for glaucoma screening and checkup Recommended yearly dental visit for hygiene and checkup  Vaccinations: Influenza vaccine: completed today Pneumococcal vaccine: completed series today Tdap vaccine: declined Shingles vaccine: declined  Advanced directives: Please bring a copy of your POA (Power of Attorney) and/or Living Will to your next appointment.   Conditions/risks identified: Recommend increasing water intake to 6-8 glasses a day.   Next appointment: 03/19/17 @ 9:00 AM  Preventive Care 65 Years and Older, Male Preventive care refers to lifestyle choices and visits with your health care provider that can promote health and wellness. What does preventive care include?  A yearly physical exam. This is also called an annual well check.  Dental exams once or twice a year.  Routine eye exams. Ask your health care provider how often you should have your eyes checked.  Personal lifestyle choices, including:  Daily care of your teeth and gums.  Regular physical activity.  Eating a healthy diet.  Avoiding tobacco and drug use.  Limiting alcohol use.  Practicing safe sex.  Taking low doses of aspirin every day.  Taking vitamin and mineral supplements as recommended by your health care provider. What happens during an annual well check? The services and screenings done by your health care provider during your annual well check will depend on your age, overall health, lifestyle risk factors, and family history of disease. Counseling  Your health care provider may ask you questions about your:  Alcohol use.  Tobacco  use.  Drug use.  Emotional well-being.  Home and relationship well-being.  Sexual activity.  Eating habits.  History of falls.  Memory and ability to understand (cognition).  Work and work Astronomer. Screening  You may have the following tests or measurements:  Height, weight, and BMI.  Blood pressure.  Lipid and cholesterol levels. These may be checked every 5 years, or more frequently if you are over 1 years old.  Skin check.  Lung cancer screening. You may have this screening every year starting at age 90 if you have a 30-pack-year history of smoking and currently smoke or have quit within the past 15 years.  Fecal occult blood test (FOBT) of the stool. You may have this test every year starting at age 28.  Flexible sigmoidoscopy or colonoscopy. You may have a sigmoidoscopy every 5 years or a colonoscopy every 10 years starting at age 95.  Prostate cancer screening. Recommendations will vary depending on your family history and other risks.  Hepatitis C blood test.  Hepatitis B blood test.  Sexually transmitted disease (STD) testing.  Diabetes screening. This is done by checking your blood sugar (glucose) after you have not eaten for a while (fasting). You may have this done every 1-3 years.  Abdominal aortic aneurysm (AAA) screening. You may need this if you are a current or former smoker.  Osteoporosis. You may be screened starting at age 62 if you are at high risk. Talk with your health care provider about your test results, treatment options, and if necessary, the need for more tests. Vaccines  Your health care provider may recommend certain vaccines, such as:  Influenza vaccine. This is recommended every year.  Tetanus, diphtheria, and  acellular pertussis (Tdap, Td) vaccine. You may need a Td booster every 10 years.  Zoster vaccine. You may need this after age 6.  Pneumococcal 13-valent conjugate (PCV13) vaccine. One dose is recommended after age  26.  Pneumococcal polysaccharide (PPSV23) vaccine. One dose is recommended after age 52. Talk to your health care provider about which screenings and vaccines you need and how often you need them. This information is not intended to replace advice given to you by your health care provider. Make sure you discuss any questions you have with your health care provider. Document Released: 08/10/2015 Document Revised: 04/02/2016 Document Reviewed: 05/15/2015 Elsevier Interactive Patient Education  2017 [place] Prevention in the Home Falls can cause injuries. They can happen to people of all ages. There are many things you can do to make your home safe and to help prevent falls. What can I do on the outside of my home?  Regularly fix the edges of walkways and driveways and fix any cracks.  Remove anything that might make you trip as you walk through a door, such as a raised step or threshold.  Trim any bushes or trees on the path to your home.  Use bright outdoor lighting.  Clear any walking paths of anything that might make someone trip, such as rocks or tools.  Regularly check to see if handrails are loose or broken. Make sure that both sides of any steps have handrails.  Any raised decks and porches should have guardrails on the edges.  Have any leaves, snow, or ice cleared regularly.  Use sand or salt on walking paths during winter.  Clean up any spills in your garage right away. This includes oil or grease spills. What can I do in the bathroom?  Use night lights.  Install grab bars by the toilet and in the tub and shower. Do not use towel bars as grab bars.  Use non-skid mats or decals in the tub or shower.  If you need to sit down in the shower, use a plastic, non-slip stool.  Keep the floor dry. Clean up any water that spills on the floor as soon as it happens.  Remove soap buildup in the tub or shower regularly.  Attach bath mats securely with double-sided  non-slip rug tape.  Do not have throw rugs and other things on the floor that can make you trip. What can I do in the bedroom?  Use night lights.  Make sure that you have a light by your bed that is easy to reach.  Do not use any sheets or blankets that are too big for your bed. They should not hang down onto the floor.  Have a firm chair that has side arms. You can use this for support while you get dressed.  Do not have throw rugs and other things on the floor that can make you trip. What can I do in the kitchen?  Clean up any spills right away.  Avoid walking on wet floors.  Keep items that you use a lot in easy-to-reach places.  If you need to reach something above you, use a strong step stool that has a grab bar.  Keep electrical cords out of the way.  Do not use floor polish or wax that makes floors slippery. If you must use wax, use non-skid floor wax.  Do not have throw rugs and other things on the floor that can make you trip. What can I do with my stairs?  Do not leave any items on the stairs.  Make sure that there are handrails on both sides of the stairs and use them. Fix handrails that are broken or loose. Make sure that handrails are as long as the stairways.  Check any carpeting to make sure that it is firmly attached to the stairs. Fix any carpet that is loose or worn.  Avoid having throw rugs at the top or bottom of the stairs. If you do have throw rugs, attach them to the floor with carpet tape.  Make sure that you have a light switch at the top of the stairs and the bottom of the stairs. If you do not have them, ask someone to add them for you. What else can I do to help prevent falls?  Wear shoes that:  Do not have high heels.  Have rubber bottoms.  Are comfortable and fit you well.  Are closed at the toe. Do not wear sandals.  If you use a stepladder:  Make sure that it is fully opened. Do not climb a closed stepladder.  Make sure that both  sides of the stepladder are locked into place.  Ask someone to hold it for you, if possible.  Clearly mark and make sure that you can see:  Any grab bars or handrails.  First and last steps.  Where the edge of each step is.  Use tools that help you move around (mobility aids) if they are needed. These include:  Canes.  Walkers.  Scooters.  Crutches.  Turn on the lights when you go into a dark area. Replace any light bulbs as soon as they burn out.  Set up your furniture so you have a clear path. Avoid moving your furniture around.  If any of your floors are uneven, fix them.  If there are any pets around you, be aware of where they are.  Review your medicines with your doctor. Some medicines can make you feel dizzy. This can increase your chance of falling. Ask your doctor what other things that you can do to help prevent falls. This information is not intended to replace advice given to you by your health care provider. Make sure you discuss any questions you have with your health care provider. Document Released: 05/10/2009 Document Revised: 12/20/2015 Document Reviewed: 08/18/2014 Elsevier Interactive Patient Education  2017 [employer].

## 2017-03-19 ENCOUNTER — Ambulatory Visit (INDEPENDENT_AMBULATORY_CARE_PROVIDER_SITE_OTHER): Payer: Medicare Other | Admitting: Physician Assistant

## 2017-03-19 ENCOUNTER — Encounter: Payer: Self-pay | Admitting: Physician Assistant

## 2017-03-19 VITALS — BP 120/60 | HR 60 | Temp 97.7°F | Resp 16 | Ht 70.0 in | Wt 182.0 lb

## 2017-03-19 DIAGNOSIS — Z0000 Encounter for general adult medical examination without abnormal findings: Secondary | ICD-10-CM

## 2017-03-19 DIAGNOSIS — Z125 Encounter for screening for malignant neoplasm of prostate: Secondary | ICD-10-CM | POA: Diagnosis not present

## 2017-03-19 DIAGNOSIS — I200 Unstable angina: Secondary | ICD-10-CM

## 2017-03-19 DIAGNOSIS — E785 Hyperlipidemia, unspecified: Secondary | ICD-10-CM

## 2017-03-19 DIAGNOSIS — I10 Essential (primary) hypertension: Secondary | ICD-10-CM

## 2017-03-19 DIAGNOSIS — E118 Type 2 diabetes mellitus with unspecified complications: Secondary | ICD-10-CM | POA: Diagnosis not present

## 2017-03-19 DIAGNOSIS — R600 Localized edema: Secondary | ICD-10-CM | POA: Diagnosis not present

## 2017-03-19 MED ORDER — POTASSIUM CHLORIDE ER 10 MEQ PO TBCR: 10.0000 meq | EXTENDED_RELEASE_TABLET | Freq: Every day | ORAL | 1 refills | Status: DC

## 2017-03-19 MED ORDER — FUROSEMIDE 20 MG PO TABS: 20.0000 mg | ORAL_TABLET | Freq: Every day | ORAL | 1 refills | Status: DC

## 2017-03-19 NOTE — Patient Instructions (Signed)

## 2017-03-19 NOTE — Progress Notes (Signed)
Patient: [name] Macioce., Male    DOB: 11/19/1939, 76 y.o.   MRN: [id number] Visit Date: 03/19/2017  Today's Provider: Margaretann Loveless, [name]   Chief Complaint  Patient presents with  . Annual Exam   Subjective:    Annual physical exam [name] Logeman Sr. is a 76 y.o. male who presents today for health maintenance and complete physical. He feels well. He reports exercising active with daily activies. He reports he is sleeping well.  03/17/17 AWV with Mckenzie ([facility]) 03/13/16 CPE 03/13/16 PSA 4.5 -----------------------------------------------------------------   Review of Systems  Constitutional: Negative.   HENT: Negative.   Eyes: Negative.   Respiratory: Negative.   Cardiovascular: Positive for leg swelling.  Gastrointestinal: Negative.   Endocrine: Negative.   Genitourinary: Negative.   Musculoskeletal: Negative.   Skin: Negative.   Allergic/Immunologic: Negative.   Neurological: Negative.   Hematological: Negative.   Psychiatric/Behavioral: Negative.     Social History      He  reports that he has quit smoking. His smoking use included Cigarettes. He has a 4.00 pack-year smoking history. His smokeless tobacco use includes Chew. He reports that he does not drink alcohol or use drugs.       Social History   Social History  . Marital status: Married    Spouse name: N/A  . Number of children: N/A  . Years of education: N/A   Social History Main Topics  . Smoking status: Former Smoker    Packs/day: 1.00    Years: 4.00    Types: Cigarettes  . Smokeless tobacco: Current User    Types: Chew  . Alcohol use No  . Drug use: No  . Sexual activity: Not Asked   Other Topics Concern  . None   Social History Narrative  . None    Past Medical History:  Diagnosis Date  . Arthritis   . Back pain    hx  . Colon polyps   . DM2 (diabetes mellitus, type 2) (HCC)    stable  . GERD (gastroesophageal reflux disease)   . Glaucoma     . Gout   . HLD (hyperlipidemia)   . HTN (hypertension)   . Neuropathy   . Unstable angina (HCC)    resolved. a. negative myocardial infarction     Patient Active Problem List   Diagnosis Date Noted  . Sebaceous cyst 09/10/2016  . Neuropathy 05/22/2016  . Abnormal EKG 11/26/2012  . Type 2 diabetes mellitus with complication, without long-term current use of insulin (HCC) 11/07/2009  . Mixed hyperlipidemia 11/07/2009  . GOUT, UNSPECIFIED 11/07/2009  . Essential hypertension 11/07/2009  . UNSTABLE ANGINA 11/07/2009  . 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 GERD 11/07/2009  . BACK PAIN, CHRONIC 11/07/2009    Past Surgical History:  Procedure Laterality Date  . CORONARY ARTERY BYPASS GRAFT  2004  . KNEE SURGERY      Family History        Family Status  Relation Status  . Mother Deceased  . Father Deceased        His Family history is unknown by patient.     Allergies  Allergen Reactions  . Ramipril      Current Outpatient Prescriptions:  .  allopurinol (ZYLOPRIM) 300 MG tablet, Take 1 tablet (300 mg total) by mouth daily., Disp: 90 tablet, Rfl: 3 .  atorvastatin (LIPITOR) 20 MG tablet, Take 1 tablet (20 mg total) by mouth daily., Disp: 90  tablet, Rfl: 3 .  digoxin (LANOXIN) 0.125 MG tablet, TAKE 1 TABLET BY MOUTH DAILY, Disp: 90 tablet, Rfl: 1 .  empagliflozin (JARDIANCE) 25 MG TABS tablet, Take 25 mg by mouth daily., Disp: 90 tablet, Rfl: 3 .  ferrous sulfate ([employer] IRON) 325 (65 FE) MG tablet, Take 325 mg by mouth. , Disp: , Rfl:  .  gabapentin (NEURONTIN) 300 MG capsule, Take 1 capsule (300 mg total) by mouth 3 (three) times daily., Disp: 90 capsule, Rfl: 5 .  glipiZIDE (GLUCOTROL XL) 10 MG 24 hr tablet, Take 1 tablet (10 mg total) by mouth daily with breakfast., Disp: 90 tablet, Rfl: 3 .  glucose blood (ONE TOUCH TEST STRIPS) test strip, Check blood sugar q a.m. fasting, Disp: 100 each, Rfl: 5 .  hydrochlorothiazide (MICROZIDE) 12.5 MG capsule,  Take 1 capsule (12.5 mg total) by mouth daily as needed., Disp: 90 capsule, Rfl: 3 .  lisinopril (PRINIVIL,ZESTRIL) 20 MG tablet, TAKE 1 TABLET BY MOUTH ONCE DAILY, Disp: 90 tablet, Rfl: 3 .  meloxicam (MOBIC) 7.5 MG tablet, TAKE 1 TABLET BY MOUTH TWICE (2) DAILY, Disp: 60 tablet, Rfl: 5 .  metFORMIN (GLUCOPHAGE) 1000 MG tablet, TAKE 1 TABLET BY MOUTH TWICE A DAY WITH A MEAL, Disp: 180 tablet, Rfl: 3 .  nitroGLYCERIN (NITROSTAT) 0.4 MG SL tablet, Place 1 tablet (0.4 mg total) under the tongue every 5 (five) minutes as needed for chest pain., Disp: 25 tablet, Rfl: 6 .  ONGLYZA 5 MG TABS tablet, TAKE 1 TABLET BY MOUTH ONCE DAILY, Disp: 90 tablet, Rfl: 1   Patient Care Team: Margaretann Loveless, [name] as PCP - General (Family Medicine) Antonieta Iba, MD as Consulting Physician (Cardiology) Trey Sailors, [name] as Physician Assistant (Physician Assistant) Earline Mayotte, MD (General Surgery)      Objective:   Vitals: BP 120/60 (BP Location: Left Arm, Patient Position: Sitting, Cuff Size: Large)   Pulse 60   Temp 97.7 F (36.5 C) (Oral)   Resp 16   Ht 5\' 10"  (1.778 m)   Wt 182 lb (82.6 kg)   SpO2 98%   BMI 26.11 kg/m    Vitals:   03/19/17 0902  BP: 120/60  Pulse: 60  Resp: 16  Temp: 97.7 F (36.5 C)  TempSrc: Oral  SpO2: 98%  Weight: 182 lb (82.6 kg)  Height: 5\' 10"  (1.778 m)     Physical Exam  Constitutional: He is oriented to person, place, and time. He appears well-developed and well-nourished.  HENT:  Head: Normocephalic and atraumatic.  Right Ear: External ear normal.  Left Ear: External ear normal.  Nose: Nose normal.  Mouth/Throat: Oropharynx is clear and moist.  Eyes: Pupils are equal, round, and reactive to light. Conjunctivae and EOM are normal. Right eye exhibits no discharge.  Neck: Normal range of motion. Neck supple. No tracheal deviation present. No thyromegaly present.  Cardiovascular: Normal rate, regular rhythm, normal heart sounds and intact  distal pulses.   No murmur heard. Pulmonary/Chest: Effort normal and breath sounds normal. No respiratory distress. He has no wheezes. He has no rales. He exhibits no tenderness.  Abdominal: Soft. He exhibits no distension and no mass. There is no tenderness. There is no rebound and no guarding.  Genitourinary:  Genitourinary Comments: Deferred per patient. No symptoms  Musculoskeletal: Normal range of motion. He exhibits edema. He exhibits no tenderness (2+ pitting edema bilaterally).  Lymphadenopathy:    He has no cervical adenopathy.  Neurological: He is alert and oriented to  person, place, and time. He has normal reflexes. No cranial nerve deficit. He exhibits normal muscle tone. Coordination normal.  Skin: Skin is warm and dry. No rash noted. No erythema.  Psychiatric: He has a normal mood and affect. His behavior is normal. Judgment and thought content normal.     Depression Screen PHQ 2/9 Scores 03/17/2017 03/13/2016  PHQ - 2 Score 0 0   Fall Risk  03/17/2017 03/13/2016  Falls in the past year? No No       6CIT Screen 03/17/2017  What Year? 0 points  What month? 0 points  What time? 0 points  Count back from 20 0 points  Months in reverse (No Data)  Repeat phrase 2 points    Diabetic Foot Form - Detailed   Diabetic Foot Exam - detailed Diabetic Foot exam was performed with the following findings:  Yes 03/19/2017 10:06 AM  Visual Foot Exam completed.:  Yes  Can the patient see the bottom of their feet?:  No Are the shoes appropriate in style and fit?:  Yes Is there swelling or and abnormal foot shape?:  Yes (Comment: bilat lower extremities to ankles) Is there a claw toe deformity?:  No Is there elevated skin temparature?:  No Is there foot or ankle muscle weakness?:  No Normal Range of Motion:  Yes Pulse Foot Exam completed.:  Yes  Right posterior Tibialias:  Present Left posterior Tibialias:  Present  Right Dorsalis Pedis:  Present Left Dorsalis Pedis:  Present    Semmes-Weinstein Monofilament Test R Site 1-Great Toe:  Neg L Site 1-Great Toe:  Pos          Assessment & Plan:     Routine Health Maintenance and Physical Exam  Exercise Activities and Dietary recommendations Goals    . Increase water intake          Recommend increasing water intake to 6-8 glasses a day.        Immunization History  Administered Date(s) Administered  . Influenza, High Dose Seasonal PF 03/17/2017  . Influenza,inj,Quad PF,6+ Mos 05/05/2016  . Pneumococcal Conjugate-13 03/13/2016  . Pneumococcal Polysaccharide-23 03/17/2017    Health Maintenance  Topic Date Due  . FOOT EXAM  11/20/1949  . OPHTHALMOLOGY EXAM  02/25/2018 (Originally 11/20/1949)  . TETANUS/TDAP  02/25/2018 (Originally 11/20/1959)  . HEMOGLOBIN A1C  06/30/2017  . INFLUENZA VACCINE  Completed  . PNA vac Low Risk Adult  Completed     Discussed health benefits of physical activity, and encouraged him to engage in regular exercise appropriate for his age and condition.    1. Annual physical exam Normal physical exam today. Will check labs as below and f/u pending lab results. He is to call the office in the meantime if he has any acute issue, questions or concerns.  2. Essential hypertension Stable. Dr. Mariah Milling stopped amlodipine and statrted HCTZ for leg swelling. Patient continues to have lower extremity edema. Will stop HCTZ and start furosemide as noted below with potassium. I will see him back in 3 months for blood pressure recheck. I will check his potassium at that time.  - CBC with Differential/Platelet - Comprehensive metabolic panel  3. Prostate cancer screening Will check labs as below and f/u pending results. - PSA  4. Hyperlipidemia, unspecified hyperlipidemia type Stable. Continue atorvastatin 20mg . Will check labs as below and f/u pending results. - Lipid Panel With LDL/HDL Ratio  5. Type 2 diabetes mellitus with complication, without long-term current use of insulin  (HCC) Stable. Continue  metformin, glipizide and onglyza as prescribed. Will check labs as below and f/u pending results. I will see him back in 3 months for recheck. - HgB A1c  6. UNSTABLE ANGINA Will check labs as below and f/u pending results. - Digoxin level  7. Bilateral lower extremity edema See above medical treatment plan for #2. - furosemide (LASIX) 20 MG tablet; Take 1 tablet (20 mg total) by mouth daily.  Dispense: 90 tablet; Refill: 1 - potassium chloride (K-DUR) 10 MEQ tablet; Take 1 tablet (10 mEq total) by mouth daily.  Dispense: 90 tablet; Refill: 1  --------------------------------------------------------------------    Margaretann Loveless, [name]  [facility]

## 2017-03-20 LAB — COMPREHENSIVE METABOLIC PANEL
ALT: 21 IU/L (ref 0–44)
AST: 18 IU/L (ref 0–40)
Albumin/Globulin Ratio: 2.3 — ABNORMAL HIGH (ref 1.2–2.2)
Albumin: 4.5 g/dL (ref 3.5–4.8)
Alkaline Phosphatase: 56 IU/L (ref 39–117)
BILIRUBIN TOTAL: 0.4 mg/dL (ref 0.0–1.2)
BUN/Creatinine Ratio: 12 (ref 10–24)
BUN: 12 mg/dL (ref 8–27)
CALCIUM: 9.8 mg/dL (ref 8.6–10.2)
CHLORIDE: 96 mmol/L (ref 96–106)
CO2: 24 mmol/L (ref 20–29)
Creatinine, Ser: 1.04 mg/dL (ref 0.76–1.27)
GFR, EST AFRICAN AMERICAN: 80 mL/min/{1.73_m2} (ref >59)
GFR, EST NON AFRICAN AMERICAN: 69 mL/min/{1.73_m2} (ref >59)
GLUCOSE: 232 mg/dL — AB (ref 65–99)
Globulin, Total: 2 g/dL (ref 1.5–4.5)
Potassium: 5 mmol/L (ref 3.5–5.2)
Sodium: 135 mmol/L (ref 134–144)
TOTAL PROTEIN: 6.5 g/dL (ref 6.0–8.5)

## 2017-03-20 LAB — CBC WITH DIFFERENTIAL/PLATELET
BASOS ABS: 0 10*3/uL (ref 0.0–0.2)
Basos: 1 %
EOS (ABSOLUTE): 0.3 10*3/uL (ref 0.0–0.4)
EOS: 4 %
Hematocrit: 36.4 % — ABNORMAL LOW (ref 37.5–51.0)
Hemoglobin: 12.3 g/dL — ABNORMAL LOW (ref 13.0–17.7)
IMMATURE GRANS (ABS): 0 10*3/uL (ref 0.0–0.1)
IMMATURE GRANULOCYTES: 0 %
LYMPHS: 26 %
Lymphocytes Absolute: 2 10*3/uL (ref 0.7–3.1)
MCH: 30.1 pg (ref 26.6–33.0)
MCHC: 33.8 g/dL (ref 31.5–35.7)
MCV: 89 fL (ref 79–97)
MONOCYTES: 9 %
Monocytes Absolute: 0.7 10*3/uL (ref 0.1–0.9)
NEUTROS PCT: 60 %
Neutrophils Absolute: 4.6 10*3/uL (ref 1.4–7.0)
PLATELETS: 228 10*3/uL (ref 150–379)
RBC: 4.09 x10E6/uL — ABNORMAL LOW (ref 4.14–5.80)
RDW: 12.7 % (ref 12.3–15.4)
WBC: 7.7 10*3/uL (ref 3.4–10.8)

## 2017-03-20 LAB — LIPID PANEL WITH LDL/HDL RATIO
Cholesterol, Total: 99 mg/dL — ABNORMAL LOW (ref 100–199)
HDL: 35 mg/dL — AB (ref >39)
LDL Calculated: 40 mg/dL (ref 0–99)
LDl/HDL Ratio: 1.1 ratio (ref 0.0–3.6)
TRIGLYCERIDES: 120 mg/dL (ref 0–149)
VLDL CHOLESTEROL CAL: 24 mg/dL (ref 5–40)

## 2017-03-20 LAB — PSA: PROSTATE SPECIFIC AG, SERUM: 3.7 ng/mL (ref 0.0–4.0)

## 2017-03-20 LAB — HEMOGLOBIN A1C
Est. average glucose Bld gHb Est-mCnc: 200 mg/dL
Hgb A1c MFr Bld: 8.6 % — ABNORMAL HIGH (ref 4.8–5.6)

## 2017-03-20 LAB — DIGOXIN LEVEL: Digoxin, Serum: 1.6 ng/mL — ABNORMAL HIGH (ref 0.5–0.9)

## 2017-04-06 ENCOUNTER — Other Ambulatory Visit: Payer: Self-pay | Admitting: Physician Assistant

## 2017-04-06 DIAGNOSIS — M1A9XX0 Chronic gout, unspecified, without tophus (tophi): Secondary | ICD-10-CM

## 2017-04-16 ENCOUNTER — Other Ambulatory Visit: Payer: Self-pay | Admitting: Physician Assistant

## 2017-04-16 DIAGNOSIS — I482 Chronic atrial fibrillation, unspecified: Secondary | ICD-10-CM

## 2017-04-20 ENCOUNTER — Other Ambulatory Visit: Payer: Self-pay | Admitting: Physician Assistant

## 2017-04-20 DIAGNOSIS — G629 Polyneuropathy, unspecified: Secondary | ICD-10-CM

## 2017-04-28 ENCOUNTER — Other Ambulatory Visit: Payer: Self-pay | Admitting: Physician Assistant

## 2017-04-28 DIAGNOSIS — I10 Essential (primary) hypertension: Secondary | ICD-10-CM

## 2017-04-28 DIAGNOSIS — I482 Chronic atrial fibrillation, unspecified: Secondary | ICD-10-CM

## 2017-04-28 DIAGNOSIS — E7800 Pure hypercholesterolemia, unspecified: Secondary | ICD-10-CM

## 2017-05-11 ENCOUNTER — Other Ambulatory Visit: Payer: Self-pay | Admitting: Physician Assistant

## 2017-05-11 DIAGNOSIS — E118 Type 2 diabetes mellitus with unspecified complications: Secondary | ICD-10-CM

## 2017-05-12 ENCOUNTER — Other Ambulatory Visit: Payer: Self-pay | Admitting: Physician Assistant

## 2017-05-12 DIAGNOSIS — E119 Type 2 diabetes mellitus without complications: Secondary | ICD-10-CM

## 2017-06-02 ENCOUNTER — Ambulatory Visit
Admission: RE | Admit: 2017-06-02 | Discharge: 2017-06-02 | Disposition: A | Discharge status: Home / Self Care | Payer: Medicare Other | Source: Ambulatory Visit | Attending: Family Medicine | Admitting: Family Medicine

## 2017-06-02 ENCOUNTER — Encounter: Payer: Self-pay | Admitting: Family Medicine

## 2017-06-02 ENCOUNTER — Ambulatory Visit: Payer: Medicare Other | Admitting: Family Medicine

## 2017-06-02 VITALS — BP 148/62 | HR 67 | Temp 98.1°F | Wt 178.6 lb

## 2017-06-02 DIAGNOSIS — M25562 Pain in left knee: Secondary | ICD-10-CM | POA: Insufficient documentation

## 2017-06-02 NOTE — Progress Notes (Signed)
Patient: [name] AlphaHoward Hendrick Echeverria Sr. Male    DOB: 11/19/1939   76 y.o.   MRN: [id number] Visit Date: 06/02/2017  Today's Provider: Dortha Kernennis Chrismon, PA   Chief Complaint  Patient presents with  . Knee Pain   Subjective:    Knee Pain   Incident onset: 1 month ago. Injury mechanism: about 1 month ago patient stepped up on a step and heard a pop. Pain improved about 2-3 days after that then pain returned this past weekend.  The pain is present in the left knee. The quality of the pain is described as aching and shooting. The pain has been fluctuating since onset. He reports no foreign bodies present. Exacerbated by: lying down at night. He has tried heat and ice (Oxycodone) for the symptoms. The treatment provided no relief.   Past Medical History:  Diagnosis Date  . Arthritis   . Back pain    hx  . Colon polyps   . DM2 (diabetes mellitus, type 2) (HCC)    stable  . GERD (gastroesophageal reflux disease)   . Glaucoma   . Gout   . HLD (hyperlipidemia)   . HTN (hypertension)   . Neuropathy   . Unstable angina (HCC)    resolved. a. negative myocardial infarction   Past Surgical History:  Procedure Laterality Date  . CORONARY ARTERY BYPASS GRAFT  2004  . KNEE SURGERY     Family History  Family history unknown: Yes     Allergies  Allergen Reactions  . Ramipril     Current Outpatient Medications:  .  ACCU-CHEK AVIVA PLUS test strip, USE AS DIRECTED TO CHECK FASTING BLOOD SUGAR EVERY MORNING, Disp: 100 each, Rfl: 4 .  allopurinol (ZYLOPRIM) 300 MG tablet, TAKE 1 TABLET BY MOUTH ONCE DAILY, Disp: 90 tablet, Rfl: 1 .  amLODipine-atorvastatin (CADUET) 5-20 MG tablet, TAKE 1 TABLET BY MOUTH ONCE DAILY, Disp: 90 tablet, Rfl: 1 .  atorvastatin (LIPITOR) 20 MG tablet, Take 1 tablet (20 mg total) by mouth daily., Disp: 90 tablet, Rfl: 3 .  digoxin (LANOXIN) 0.125 MG tablet, TAKE 1 TABLET BY MOUTH ONCE DAILY, Disp: 90 tablet, Rfl: 1 .  empagliflozin (JARDIANCE) 25 MG TABS  tablet, Take 25 mg by mouth daily., Disp: 90 tablet, Rfl: 3 .  ferrous sulfate ([employer]S IRON) 325 (65 FE) MG tablet, Take 325 mg by mouth. , Disp: , Rfl:  .  furosemide (LASIX) 20 MG tablet, Take 1 tablet (20 mg total) by mouth daily., Disp: 90 tablet, Rfl: 1 .  gabapentin (NEURONTIN) 300 MG capsule, TAKE 1 CAPSULE BY MOUTH 3 TIMES A DAY, Disp: 90 capsule, Rfl: 5 .  glipiZIDE (GLUCOTROL XL) 10 MG 24 hr tablet, Take 1 tablet (10 mg total) by mouth daily with breakfast., Disp: 90 tablet, Rfl: 3 .  lisinopril (PRINIVIL,ZESTRIL) 20 MG tablet, TAKE 1 TABLET BY MOUTH ONCE DAILY, Disp: 90 tablet, Rfl: 3 .  meloxicam (MOBIC) 7.5 MG tablet, TAKE 1 TABLET BY MOUTH TWICE (2) DAILY, Disp: 60 tablet, Rfl: 5 .  metFORMIN (GLUCOPHAGE) 1000 MG tablet, TAKE 1 TABLET BY MOUTH TWICE A DAY WITH A MEAL, Disp: 180 tablet, Rfl: 1 .  nitroGLYCERIN (NITROSTAT) 0.4 MG SL tablet, Place 1 tablet (0.4 mg total) under the tongue every 5 (five) minutes as needed for chest pain., Disp: 25 tablet, Rfl: 6 .  ONGLYZA 5 MG TABS tablet, TAKE 1 TABLET BY MOUTH ONCE DAILY, Disp: 90 tablet, Rfl: 1 .  potassium chloride (K-DUR) 10  MEQ tablet, Take 1 tablet (10 mEq total) by mouth daily., Disp: 90 tablet, Rfl: 1  Review of Systems  Constitutional: Negative.   Respiratory: Negative.   Cardiovascular: Negative.   Musculoskeletal: Positive for arthralgias (left knee).    Social History   Tobacco Use  . Smoking status: Former Smoker    Packs/day: 1.00    Years: 4.00    Pack years: 4.00    Types: Cigarettes  . Smokeless tobacco: Current User    Types: Chew  Substance Use Topics  . Alcohol use: No    Alcohol/week: 0.5 oz    Types: 1 Standard drinks or equivalent per week   Objective:   BP (!) 148/62 (BP Location: Right Arm, Patient Position: Sitting, Cuff Size: Normal)   Pulse 67   Temp 98.1 F (36.7 C) (Oral)   Wt 178 lb 9.6 oz (81 kg)   SpO2 97%   BMI 25.63 kg/m    Physical Exam  Constitutional: He is oriented to  person, place, and time. He appears well-developed and well-nourished. No distress.  HENT:  Head: Normocephalic and atraumatic.  Right Ear: Hearing normal.  Left Ear: Hearing normal.  Nose: Nose normal.  Eyes: Conjunctivae and lids are normal. Right eye exhibits no discharge. Left eye exhibits no discharge. No scleral icterus.  Cardiovascular: Normal rate.  Pulmonary/Chest: Effort normal and breath sounds normal. No respiratory distress.  Abdominal: Soft. Bowel sounds are normal.  Musculoskeletal: Normal range of motion. He exhibits tenderness.  Left knee along the area of the medial collateral ligament attachment to the tibia. Some soreness above the patella laterally. No crepitus or locking. Full ROM. History of surgery on the right patella for fracture 20+ years ago. Good pulses bilaterally.  Neurological: He is alert and oriented to person, place, and time.  Skin: Skin is intact. No lesion and no rash noted.  Psychiatric: He has a normal mood and affect. His speech is normal and behavior is normal. Thought content normal.      Assessment & Plan:     1. Acute pain of left knee Original injury occurred a month ago and got better with use of Blue Emu Ointment and rest. Recurrence of pain over the past 2-3 days without additional injury. Recommend Neoprene support brace with patellar window. Continue Meloxicam 7.5 mg BID and get x-ray evaluation. May need orthopedic referral pending reports and follow up. - DG Knee Complete 4 Views Left       Dortha Kernennis Chrismon, PA  [facility]

## 2017-06-08 ENCOUNTER — Other Ambulatory Visit: Payer: Self-pay | Admitting: Physician Assistant

## 2017-06-08 DIAGNOSIS — I10 Essential (primary) hypertension: Secondary | ICD-10-CM

## 2017-06-10 ENCOUNTER — Other Ambulatory Visit: Payer: Self-pay | Admitting: Physician Assistant

## 2017-06-10 DIAGNOSIS — E118 Type 2 diabetes mellitus with unspecified complications: Secondary | ICD-10-CM

## 2017-06-11 ENCOUNTER — Other Ambulatory Visit: Payer: Self-pay | Admitting: Physician Assistant

## 2017-06-12 ENCOUNTER — Ambulatory Visit: Payer: Medicare Other | Admitting: Physician Assistant

## 2017-06-12 ENCOUNTER — Encounter: Payer: Self-pay | Admitting: Physician Assistant

## 2017-06-12 ENCOUNTER — Other Ambulatory Visit: Payer: Self-pay

## 2017-06-12 VITALS — BP 140/80 | Temp 97.8°F | Resp 16 | Wt 179.4 lb

## 2017-06-12 DIAGNOSIS — M25562 Pain in left knee: Secondary | ICD-10-CM

## 2017-06-12 DIAGNOSIS — R710 Precipitous drop in hematocrit: Secondary | ICD-10-CM | POA: Diagnosis not present

## 2017-06-12 DIAGNOSIS — E119 Type 2 diabetes mellitus without complications: Secondary | ICD-10-CM

## 2017-06-12 NOTE — Progress Notes (Signed)
Patient: [name] Hersh Sr. Male    DOB: 11/19/1939   76 y.o.   MRN: [id number] Visit Date: 06/12/2017  Today's Provider: Mar Daring, [name]   Chief Complaint  Patient presents with  . Follow-up    DMT2 and HTN   Subjective:    HPI  Diabetes Mellitus Type II, Follow-up:   Lab Results  Component Value Date   HGBA1C 8.6 (H) 03/19/2017   HGBA1C 8.3 12/29/2016   HGBA1C 8.5 09/29/2016    Last seen for diabetes 3 months ago.  Management since then includes none,continue metformin, glipizide and onglyza as prescribed He reports excellent compliance with treatment. He is not having side effects.  Current symptoms include none and have been stable. Home blood sugar records: 130's  Episodes of hypoglycemia? no  Most Recent Eye Exam: Reports he needs to make an appointment Weight trend: stable Current diet: in general, an "unhealthy" diet Current exercise: gardening, walking and yard work  Pertinent Labs:    Component Value Date/Time   CHOL 99 (L) 03/19/2017 1014   TRIG 120 03/19/2017 1014   TRIG 136 02/28/2008   HDL 35 (L) 03/19/2017 1014   LDLCALC 40 03/19/2017 1014   CREATININE 1.04 03/19/2017 1014   ------------------------------------------------------------------------  Hypertension, follow-up:  BP Readings from Last 3 Encounters:  06/12/17 140/80  06/02/17 (!) 148/62  03/19/17 120/60    He was last seen for hypertension 3 months ago.  BP at that visit was 120/60. Management since that visit includes Continue atorvastatin 74m. He reports excellent compliance with treatment. He is not having side effects.  He is adherent to low salt diet.   Outside blood pressures are n/a. He is experiencing lower extremity edema.  Patient denies chest pain, chest pressure/discomfort, dyspnea, exertional chest pressure/discomfort, fatigue, irregular heart beat, near-syncope and palpitations.   Cardiovascular risk factors include advanced age  (older than 552for men, 639for women), diabetes mellitus, dyslipidemia, hypertension and male gender.    Weight trend: stable Wt Readings from Last 3 Encounters:  06/12/17 179 lb 6.4 oz (81.4 kg)  06/02/17 178 lb 9.6 oz (81 kg)  03/19/17 182 lb (82.6 kg)    Current diet: in general, an "unhealthy" diet ------------------------------------------------------------------------    Allergies  Allergen Reactions  . Ramipril      Current Outpatient Medications:  .  ACCU-CHEK AVIVA PLUS test strip, USE AS DIRECTED TO CHECK FASTING BLOOD SUGAR EVERY MORNING, Disp: 100 each, Rfl: 4 .  allopurinol (ZYLOPRIM) 300 MG tablet, TAKE 1 TABLET BY MOUTH ONCE DAILY, Disp: 90 tablet, Rfl: 1 .  amLODipine-atorvastatin (CADUET) 5-20 MG tablet, TAKE 1 TABLET BY MOUTH ONCE DAILY, Disp: 90 tablet, Rfl: 1 .  atorvastatin (LIPITOR) 20 MG tablet, Take 1 tablet (20 mg total) by mouth daily., Disp: 90 tablet, Rfl: 3 .  Blood Glucose Monitoring Suppl (ACCU-CHEK AVIVA PLUS) w/Device KIT, To check blood sugar once daily, Disp: 1 kit, Rfl: 0 .  digoxin (LANOXIN) 0.125 MG tablet, TAKE 1 TABLET BY MOUTH ONCE DAILY, Disp: 90 tablet, Rfl: 1 .  empagliflozin (JARDIANCE) 25 MG TABS tablet, Take 25 mg by mouth daily., Disp: 90 tablet, Rfl: 3 .  ferrous sulfate ([employer]S IRON) 325 (65 FE) MG tablet, Take 325 mg by mouth. , Disp: , Rfl:  .  furosemide (LASIX) 20 MG tablet, Take 1 tablet (20 mg total) by mouth daily., Disp: 90 tablet, Rfl: 1 .  gabapentin (NEURONTIN) 300 MG capsule, TAKE 1 CAPSULE BY MOUTH  3 TIMES A DAY, Disp: 90 capsule, Rfl: 5 .  glipiZIDE (GLUCOTROL XL) 10 MG 24 hr tablet, TAKE 1 TABLET BY MOUTH ONCE A DAY WITH BREAKFAST, Disp: 90 tablet, Rfl: 3 .  lisinopril (PRINIVIL,ZESTRIL) 20 MG tablet, TAKE 1 TABLET BY MOUTH ONCE DAILY, Disp: 90 tablet, Rfl: 3 .  meloxicam (MOBIC) 7.5 MG tablet, TAKE 1 TABLET BY MOUTH TWICE (2) DAILY, Disp: 60 tablet, Rfl: 5 .  metFORMIN (GLUCOPHAGE) 1000 MG tablet, TAKE 1 TABLET BY MOUTH  TWICE A DAY WITH A MEAL, Disp: 180 tablet, Rfl: 1 .  nitroGLYCERIN (NITROSTAT) 0.4 MG SL tablet, Place 1 tablet (0.4 mg total) under the tongue every 5 (five) minutes as needed for chest pain., Disp: 25 tablet, Rfl: 6 .  ONGLYZA 5 MG TABS tablet, TAKE 1 TABLET BY MOUTH ONCE DAILY, Disp: 90 tablet, Rfl: 1 .  potassium chloride (K-DUR) 10 MEQ tablet, Take 1 tablet (10 mEq total) by mouth daily., Disp: 90 tablet, Rfl: 1  Review of Systems  Constitutional: Negative.   Respiratory: Negative.   Cardiovascular: Negative.   Gastrointestinal: Negative.   Endocrine: Negative.   Musculoskeletal: Positive for arthralgias, back pain and joint swelling.  Neurological: Positive for numbness.    Social History   Tobacco Use  . Smoking status: Former Smoker    Packs/day: 1.00    Years: 4.00    Pack years: 4.00    Types: Cigarettes  . Smokeless tobacco: Current User    Types: Chew  Substance Use Topics  . Alcohol use: No    Alcohol/week: 0.5 oz    Types: 1 Standard drinks or equivalent per week   Objective:   BP 140/80 (BP Location: Left Arm, Patient Position: Sitting, Cuff Size: Large)   Temp 97.8 F (36.6 C) (Oral)   Resp 16   Wt 179 lb 6.4 oz (81.4 kg)   BMI 25.74 kg/m    Physical Exam  Constitutional: He appears well-developed and well-nourished. No distress.  HENT:  Head: Normocephalic and atraumatic.  Neck: Normal range of motion. Neck supple.  Cardiovascular: Normal rate, regular rhythm and normal heart sounds. Exam reveals no gallop and no friction rub.  No murmur heard. Pulmonary/Chest: Effort normal and breath sounds normal. No respiratory distress. He has no wheezes. He has no rales.  Musculoskeletal:       Left knee: He exhibits bony tenderness (left lateral femoral condyle). He exhibits normal range of motion, no swelling, no effusion, no erythema, normal alignment, no LCL laxity, normal patellar mobility, normal meniscus and no MCL laxity. Tenderness found. Lateral joint  line tenderness noted. No medial joint line tenderness noted.  Skin: He is not diaphoretic.  Vitals reviewed.       Assessment & Plan:      1. Diabetes mellitus without complication ([place]) Previously had increased. Will check labs as below. Continue glipizide, jardiance, onglyza and metformin currently.  - HgB K3K - Basic Metabolic Panel (BMET)  2. Drop in hemoglobin Previously noted slight decrease in hemoglobin when checked in August. Will recheck lab today. - CBC w/Diff/Platelet  3. Acute pain of left knee Discussed wearing knee brace. Continue meloxicam 7.36m. Discussed therapy or referral to orthopedics but patient states he just does not have time currently with being primary caregiver for his wife whom is in hospice care.        JMar Daring [name]  [facility]

## 2017-06-12 NOTE — Patient Instructions (Signed)
Patellofemoral Pain Syndrome Patellofemoral pain syndrome is a condition that involves a softening or breakdown of the tissue (cartilage) on the underside of your kneecap (patella). This causes pain in the front of the knee. The condition is also called runner's knee or chondromalacia patella. Patellofemoral pain syndrome is most common in young adults who are active in sports. Your knee is the largest joint in your body. The patella covers the front of your knee and is attached to muscles above and below your knee. The underside of the patella is covered with a smooth type of cartilage (synovium). The smooth surface helps the patella glide easily when you move your knee. Patellofemoral pain syndrome causes swelling in the joint linings and bone surfaces in your knee. What are the causes? Patellofemoral pain syndrome can be caused by:  Overuse.  Poor alignment of your knee joints.  Weak leg muscles.  A direct blow to your kneecap.  What increases the risk? You may be at risk for patellofemoral pain syndrome if you:  Do a lot of activities that can wear down your kneecap. These include: ? Running. ? Squatting. ? Climbing stairs.  Start a new physical activity or exercise program.  Wear shoes that do not fit well.  Do not have good leg strength.  Are overweight.  What are the signs or symptoms? Knee pain is the most common symptom of patellofemoral pain syndrome. This may feel like a dull, aching pain underneath your patella, in the front of your knee. There may be a popping or cracking sound when you move your knee. Pain may get worse with:  Exercise.  Climbing stairs.  Running.  Jumping.  Squatting.  Kneeling.  Sitting for a long time.  Moving or pushing on your patella.  How is this diagnosed? Your health care provider may be able to diagnose patellofemoral pain syndrome from your symptoms and medical history. You may be asked about your recent physical activities  and which ones cause knee pain. Your health care provider may do a physical exam with certain tests to confirm the diagnosis. These may include:  Moving your patella back and forth.  Checking your range of knee motion.  Having you squat or jump to see if you have pain.  Checking the strength of your leg muscles.  An MRI of the knee may also be done. How is this treated? Patellofemoral pain syndrome can usually be treated at home with rest, ice, compression, and elevation (RICE). Other treatments may include:  Nonsteroidal anti-inflammatory drugs (NSAIDs).  Physical therapy to stretch and strengthen your leg muscles.  Shoe inserts (orthotics) to take stress off your knee.  A knee brace or knee support.  Surgery to remove damaged cartilage or move the patella to a better position. The need for surgery is rare.  Follow these instructions at home:  Take medicines only as directed by your health care provider.  Rest your knee. ? When resting, keep your knee raised above the level of your heart. ? Avoid activities that cause knee pain.  Apply ice to the injured area: ? Put ice in a plastic bag. ? Place a towel between your skin and the bag. ? Leave the ice on for 20 minutes, 2-3 times a day.  Use splints, braces, knee supports, or walking aids as directed by your health care provider.  Perform stretching and strengthening exercises as directed by your health care provider or physical therapist.  Keep all follow-up visits as directed by your health care   provider. This is important. Contact a health care provider if:  Your symptoms get worse.  You are not improving with home care. This information is not intended to replace advice given to you by your health care provider. Make sure you discuss any questions you have with your health care provider. Document Released: 07/02/2009 Document Revised: 12/20/2015 Document Reviewed: 10/03/2013 Elsevier Interactive Patient Education   2018 Elsevier Inc.  

## 2017-06-13 LAB — CBC WITH DIFFERENTIAL/PLATELET
BASOS ABS: 64 {cells}/uL (ref 0–200)
Basophils Relative: 0.7 %
EOS PCT: 3 %
Eosinophils Absolute: 273 cells/uL (ref 15–500)
HCT: 35.4 % — ABNORMAL LOW (ref 38.5–50.0)
Hemoglobin: 12 g/dL — ABNORMAL LOW (ref 13.2–17.1)
Lymphs Abs: 2220 cells/uL (ref 850–3900)
MCH: 30.4 pg (ref 27.0–33.0)
MCHC: 33.9 g/dL (ref 32.0–36.0)
MCV: 89.6 fL (ref 80.0–100.0)
MONOS PCT: 6.2 %
MPV: 12.2 fL (ref 7.5–12.5)
NEUTROS PCT: 65.7 %
Neutro Abs: 5979 cells/uL (ref 1500–7800)
Platelets: 225 10*3/uL (ref 140–400)
RBC: 3.95 10*6/uL — ABNORMAL LOW (ref 4.20–5.80)
RDW: 12.1 % (ref 11.0–15.0)
TOTAL LYMPHOCYTE: 24.4 %
WBC mixed population: 564 cells/uL (ref 200–950)
WBC: 9.1 10*3/uL (ref 3.8–10.8)

## 2017-06-13 LAB — BASIC METABOLIC PANEL WITH GFR
BUN / CREAT RATIO: 15 (calc) (ref 6–22)
BUN: 21 mg/dL (ref 7–25)
CO2: 25 mmol/L (ref 20–32)
Calcium: 9.5 mg/dL (ref 8.6–10.3)
Chloride: 103 mmol/L (ref 98–110)
Creat: 1.43 mg/dL — ABNORMAL HIGH (ref 0.70–1.18)
GFR, Est African American: 55 mL/min/{1.73_m2} — ABNORMAL LOW (ref >=60)
GFR, Est Non African American: 47 mL/min/{1.73_m2} — ABNORMAL LOW (ref >=60)
GLUCOSE: 233 mg/dL — AB (ref 65–99)
Potassium: 4.7 mmol/L (ref 3.5–5.3)
SODIUM: 138 mmol/L (ref 135–146)

## 2017-06-13 LAB — HEMOGLOBIN A1C
EAG (MMOL/L): 11.2 (calc)
Hgb A1c MFr Bld: 8.7 % of total Hgb — ABNORMAL HIGH (ref <5.7)
Mean Plasma Glucose: 203 (calc)

## 2017-06-16 ENCOUNTER — Telehealth: Payer: Self-pay

## 2017-06-16 NOTE — Telephone Encounter (Signed)
-----   Message from Margaretann LovelessJennifer M Burnette, [place]PA-C sent at 06/16/2017 11:16 AM EST ----- Can we have patient come back to discuss some lab changes.

## 2017-06-16 NOTE — Telephone Encounter (Signed)
Patient advised as below.  appt 06/23/2017

## 2017-06-19 ENCOUNTER — Ambulatory Visit: Payer: Self-pay | Admitting: Physician Assistant

## 2017-06-23 ENCOUNTER — Encounter: Payer: Self-pay | Admitting: Physician Assistant

## 2017-06-23 ENCOUNTER — Ambulatory Visit: Payer: Medicare Other | Admitting: Physician Assistant

## 2017-06-23 VITALS — BP 142/70 | HR 74 | Temp 98.4°F | Resp 16 | Wt 179.8 lb

## 2017-06-23 DIAGNOSIS — R710 Precipitous drop in hematocrit: Secondary | ICD-10-CM | POA: Diagnosis not present

## 2017-06-23 DIAGNOSIS — N401 Enlarged prostate with lower urinary tract symptoms: Secondary | ICD-10-CM | POA: Diagnosis not present

## 2017-06-23 DIAGNOSIS — R3911 Hesitancy of micturition: Secondary | ICD-10-CM

## 2017-06-23 DIAGNOSIS — E118 Type 2 diabetes mellitus with unspecified complications: Secondary | ICD-10-CM | POA: Diagnosis not present

## 2017-06-23 DIAGNOSIS — R7989 Other specified abnormal findings of blood chemistry: Secondary | ICD-10-CM

## 2017-06-23 MED ORDER — TAMSULOSIN HCL 0.4 MG PO CAPS: 0.4000 mg | ORAL_CAPSULE | Freq: Every day | ORAL | 1 refills | Status: DC

## 2017-06-23 NOTE — Progress Notes (Signed)
Patient: [name] Perrot Sr. Male    DOB: 11/19/1939   76 y.o.   MRN: [id number] Visit Date: 06/23/2017  Today's Provider: Mar Daring, [name]   Chief Complaint  Patient presents with  . Follow-up    Lab   Subjective:    HPI Patient is here today to follow-up on Labs. Patient also loss his wife last week. He reports it has been hard in the last week. He reports that he misses her.  On labs from 06/12/17 patient was found to have an A1c of 8.7 that has increased since labs from 03/19/17 and 12/29/16. He reports compliance with medications (Glipizide, metformin, jardiance and onglyza). He does report not eating as healthy as he normally does due to the decline in his wife's health.  Labs also showed and increase in serum creatinine from 1.04 to 1.43 with a GFR of 47.   Also noted was a drop in hemoglobin from 12.8 in August 2017 to 12.0 on 06/12/17. He does take oral ferrous sulfate 368m on Monday and Friday currently. He denies any abdominal pain, rectal bleeding, hematochezia or melena.   He does also note that of recent he has been noticing increased nocturia with daytime hesitancy and decreased urination. PSA most recently was 3.7 on 03/19/17. He declines a prostate and rectal exam today.     Allergies  Allergen Reactions  . Ramipril      Current Outpatient Medications:  .  ACCU-CHEK AVIVA PLUS test strip, USE AS DIRECTED TO CHECK FASTING BLOOD SUGAR EVERY MORNING, Disp: 100 each, Rfl: 4 .  allopurinol (ZYLOPRIM) 300 MG tablet, TAKE 1 TABLET BY MOUTH ONCE DAILY, Disp: 90 tablet, Rfl: 1 .  amLODipine-atorvastatin (CADUET) 5-20 MG tablet, TAKE 1 TABLET BY MOUTH ONCE DAILY, Disp: 90 tablet, Rfl: 1 .  atorvastatin (LIPITOR) 20 MG tablet, Take 1 tablet (20 mg total) by mouth daily., Disp: 90 tablet, Rfl: 3 .  Blood Glucose Monitoring Suppl (ACCU-CHEK AVIVA PLUS) w/Device KIT, To check blood sugar once daily, Disp: 1 kit, Rfl: 0 .  digoxin (LANOXIN) 0.125 MG  tablet, TAKE 1 TABLET BY MOUTH ONCE DAILY, Disp: 90 tablet, Rfl: 1 .  empagliflozin (JARDIANCE) 25 MG TABS tablet, Take 25 mg by mouth daily., Disp: 90 tablet, Rfl: 3 .  ferrous sulfate ([employer] IRON) 325 (65 FE) MG tablet, Take 325 mg by mouth. , Disp: , Rfl:  .  furosemide (LASIX) 20 MG tablet, Take 1 tablet (20 mg total) by mouth daily., Disp: 90 tablet, Rfl: 1 .  gabapentin (NEURONTIN) 300 MG capsule, TAKE 1 CAPSULE BY MOUTH 3 TIMES A DAY, Disp: 90 capsule, Rfl: 5 .  glipiZIDE (GLUCOTROL XL) 10 MG 24 hr tablet, TAKE 1 TABLET BY MOUTH ONCE A DAY WITH BREAKFAST, Disp: 90 tablet, Rfl: 3 .  lisinopril (PRINIVIL,ZESTRIL) 20 MG tablet, TAKE 1 TABLET BY MOUTH ONCE DAILY, Disp: 90 tablet, Rfl: 3 .  meloxicam (MOBIC) 7.5 MG tablet, TAKE 1 TABLET BY MOUTH TWICE (2) DAILY, Disp: 60 tablet, Rfl: 5 .  metFORMIN (GLUCOPHAGE) 1000 MG tablet, TAKE 1 TABLET BY MOUTH TWICE A DAY WITH A MEAL, Disp: 180 tablet, Rfl: 1 .  nitroGLYCERIN (NITROSTAT) 0.4 MG SL tablet, Place 1 tablet (0.4 mg total) under the tongue every 5 (five) minutes as needed for chest pain., Disp: 25 tablet, Rfl: 6 .  ONGLYZA 5 MG TABS tablet, TAKE 1 TABLET BY MOUTH ONCE DAILY, Disp: 90 tablet, Rfl: 1 .  potassium chloride (K-DUR)  10 MEQ tablet, Take 1 tablet (10 mEq total) by mouth daily., Disp: 90 tablet, Rfl: 1  Review of Systems  Constitutional: Positive for activity change and appetite change.  HENT: Negative.   Respiratory: Negative.   Cardiovascular: Negative.   Gastrointestinal: Negative.   Genitourinary: Positive for decreased urine volume. Negative for difficulty urinating, discharge, dysuria, enuresis, flank pain, frequency, genital sores, hematuria, penile pain, penile swelling, scrotal swelling, testicular pain and urgency.       Nocturia x 3-4  Musculoskeletal: Negative.   Neurological: Negative.   Psychiatric/Behavioral: Positive for dysphoric mood.       Grief response    Social History   Tobacco Use  . Smoking status:  Former Smoker    Packs/day: 1.00    Years: 4.00    Pack years: 4.00    Types: Cigarettes  . Smokeless tobacco: Current User    Types: Chew  Substance Use Topics  . Alcohol use: No    Alcohol/week: 0.5 oz    Types: 1 Standard drinks or equivalent per week   Objective:   BP (!) 142/70 (BP Location: Left Arm, Patient Position: Sitting, Cuff Size: Normal)   Pulse 74   Temp 98.4 F (36.9 C) (Oral)   Resp 16   Wt 179 lb 12.8 oz (81.6 kg)   SpO2 99%   BMI 25.80 kg/m    Physical Exam  Constitutional: He appears well-developed and well-nourished. No distress.  HENT:  Head: Normocephalic and atraumatic.  Neck: Normal range of motion. Neck supple. No JVD present. No tracheal deviation present. No thyromegaly present.  Cardiovascular: Normal rate, regular rhythm and normal heart sounds. Exam reveals no gallop and no friction rub.  No murmur heard. Pulmonary/Chest: Effort normal and breath sounds normal. No respiratory distress. He has no wheezes. He has no rales.  Musculoskeletal: He exhibits no edema.  Lymphadenopathy:    He has no cervical adenopathy.  Skin: He is not diaphoretic.  Vitals reviewed.      Assessment & Plan:     1. Benign prostatic hyperplasia with urinary hesitancy Will do trial of flomax as below to see if this helps urinary symptoms. I will see him back in 1 month to recheck labs and symptoms. He is to call if symptoms worsen in the meantime.  - tamsulosin (FLOMAX) 0.4 MG CAPS capsule; Take 1 capsule (0.4 mg total) by mouth daily.  Dispense: 30 capsule; Refill: 1  2. Type 2 diabetes mellitus with complication, without long-term current use of insulin (HCC) Continue metformin 1041m BID, glipizide 122mdaily, onglyza 42m41mand Jardiance 242m38mork on lifestyle modifications and better dietary control. I will see him back in 1 month to recheck A1c. If still elevated will add GLP1 inhibitor.   3. Hemoglobin decreased Increase ferrous sulfate 3242mg76mdaily use.  Will recheck labs in 1 month.   4. Blood creatinine increased compared with prior measurement Advised to push fluids. Will recheck labs in 1 month.        JenniMar DaringC  [facility]

## 2017-06-23 NOTE — Patient Instructions (Signed)
Take ferrous sulfate 325mg  daily Push fluids Watch diet for blood sugar elevations  Tamsulosin capsules What is this medicine? TAMSULOSIN (tam SOO loe sin) is used to treat enlargement of the prostate gland in men, a condition called benign prostatic hyperplasia or BPH. It is not for use in women. It works by relaxing muscles in the prostate and bladder neck. This improves urine flow and reduces BPH symptoms. This medicine may be used for other purposes; ask your health care provider or pharmacist if you have questions. COMMON BRAND NAME(S): Flomax What should I tell my health care provider before I take this medicine? They need to know if you have any of the following conditions: -advanced kidney disease -advanced liver disease -low blood pressure -prostate cancer -an unusual or allergic reaction to tamsulosin, sulfa drugs, other medicines, foods, dyes, or preservatives -pregnant or trying to get pregnant -breast-feeding How should I use this medicine? Take this medicine by mouth about 30 minutes after the same meal every day. Follow the directions on the prescription label. Swallow the capsules whole with a glass of water. Do not crush, chew, or open capsules. Do not take your medicine more often than directed. Do not stop taking your medicine unless your doctor tells you to. Talk to your pediatrician regarding the use of this medicine in children. Special care may be needed. Overdosage: If you think you have taken too much of this medicine contact a poison control center or emergency room at once. NOTE: This medicine is only for you. Do not share this medicine with others. What if I miss a dose? If you miss a dose, take it as soon as you can. If it is almost time for your next dose, take only that dose. Do not take double or extra doses. If you stop taking your medicine for several days or more, ask your doctor or health care professional what dose you should start back on. What may  interact with this medicine? -cimetidine -fluoxetine -ketoconazole -medicines for erectile disfunction like sildenafil, tadalafil, vardenafil -medicines for high blood pressure -other alpha-blockers like alfuzosin, doxazosin, phentolamine, phenoxybenzamine, prazosin, terazosin -warfarin This list may not describe all possible interactions. Give your health care provider a list of all the medicines, herbs, non-prescription drugs, or dietary supplements you use. Also tell them if you smoke, drink alcohol, or use illegal drugs. Some items may interact with your medicine. What should I watch for while using this medicine? Visit your doctor or health care professional for regular check ups. You will need lab work done before you start this medicine and regularly while you are taking it. Check your blood pressure as directed. Ask your health care professional what your blood pressure should be, and when you should contact him or her. This medicine may make you feel dizzy or lightheaded. This is more likely to happen after the first dose, after an increase in dose, or during hot weather or exercise. Drinking alcohol and taking some medicines can make this worse. Do not drive, use machinery, or do anything that needs mental alertness until you know how this medicine affects you. Do not sit or stand up quickly. If you begin to feel dizzy, sit down until you feel better. These effects can decrease once your body adjusts to the medicine. Contact your doctor or health care professional right away if you have an erection that lasts longer than 4 hours or if it becomes painful. This may be a sign of a serious problem and must be  treated right away to prevent permanent damage. If you are thinking of having cataract surgery, tell your eye surgeon that you have taken this medicine. What side effects may I notice from receiving this medicine? Side effects that you should report to your doctor or health care  professional as soon as possible: -allergic reactions like skin rash or itching, hives, swelling of the lips, mouth, tongue, or throat -breathing problems -change in vision -feeling faint or lightheaded -irregular heartbeat -prolonged or painful erection -weakness Side effects that usually do not require medical attention (report to your doctor or health care professional if they continue or are bothersome): -back pain -change in sex drive or performance -constipation, nausea or vomiting -cough -drowsy -runny or stuffy nose -trouble sleeping This list may not describe all possible side effects. Call your doctor for medical advice about side effects. You may report side effects to FDA at 1-800-FDA-1088. Where should I keep my medicine? Keep out of the reach of children. Store at room temperature between 15 and 30 degrees C (59 and 86 degrees F). Throw away any unused medicine after the expiration date. NOTE: This sheet is a summary. It may not cover all possible information. If you have questions about this medicine, talk to your doctor, pharmacist, or health care provider.  2018 Elsevier/Gold Standard (2012-07-14 14:11:34)

## 2017-07-20 ENCOUNTER — Ambulatory Visit: Payer: Medicare Other | Admitting: Physician Assistant

## 2017-07-20 ENCOUNTER — Encounter: Payer: Self-pay | Admitting: Physician Assistant

## 2017-07-20 VITALS — BP 134/50 | HR 76 | Temp 98.2°F | Resp 14 | Wt 176.6 lb

## 2017-07-20 DIAGNOSIS — R3911 Hesitancy of micturition: Secondary | ICD-10-CM | POA: Diagnosis not present

## 2017-07-20 DIAGNOSIS — D509 Iron deficiency anemia, unspecified: Secondary | ICD-10-CM

## 2017-07-20 DIAGNOSIS — N401 Enlarged prostate with lower urinary tract symptoms: Secondary | ICD-10-CM | POA: Diagnosis not present

## 2017-07-20 DIAGNOSIS — R710 Precipitous drop in hematocrit: Secondary | ICD-10-CM

## 2017-07-20 DIAGNOSIS — E118 Type 2 diabetes mellitus with unspecified complications: Secondary | ICD-10-CM

## 2017-07-20 LAB — POCT UA - MICROALBUMIN: Microalbumin Ur, POC: 50 mg/L

## 2017-07-20 MED ORDER — FERROUS SULFATE 325 (65 FE) MG PO TABS: 325.0000 mg | ORAL_TABLET | Freq: Every day | ORAL | 3 refills | Status: DC

## 2017-07-20 MED ORDER — TAMSULOSIN HCL 0.4 MG PO CAPS: 0.4000 mg | ORAL_CAPSULE | Freq: Every day | ORAL | 5 refills | Status: DC

## 2017-07-20 NOTE — Progress Notes (Signed)
     Patient: [name] Hendrick Bufkin Sr. Male    DOB: 11/19/1939   77 y.o.   MRN: [id number] Visit Date: 07/20/2017  Today's Provider: Jennifer M Burnette, [name]   Chief Complaint  Patient presents with  . Urinary Frequency  . Diabetes   Subjective:    HPI   Patient is here for 1 month follow up. BPH: patient was started on Flomax on last visit and he is taking this medication daily. Symptoms of urinary frequency have improved since taking this medication. NO side effect.  Diabetes: patient states he checks his sugar in the morning and readings have been around 100-120, this morning was 95. Patient switched to taking Glipizide in the evening and has noticed his readings have improved and he is not getting hypoglycemic episodes as much. Sometimes he does get weak and shaky about once a week or so and reading at that time usually around 85. No syncope. He has lost some weight but states he is eating about 3 times a day and he is working outside and keeps busy usually.  Lab Results  Component Value Date   HGBA1C 8.7 (H) 06/12/2017   Wt Readings from Last 3 Encounters:  07/20/17 176 lb 9.6 oz (80.1 kg)  06/23/17 179 lb 12.8 oz (81.6 kg)  06/12/17 179 lb 6.4 oz (81.4 kg)   Hemoglobin decreased; patient was told on last visit to take iron supplement every day and he has been. No side effects, tolerating it well.    Allergies  Allergen Reactions  . Ramipril      Current Outpatient Medications:  .  ACCU-CHEK AVIVA PLUS test strip, USE AS DIRECTED TO CHECK FASTING BLOOD SUGAR EVERY MORNING, Disp: 100 each, Rfl: 4 .  allopurinol (ZYLOPRIM) 300 MG tablet, TAKE 1 TABLET BY MOUTH ONCE DAILY, Disp: 90 tablet, Rfl: 1 .  amLODipine-atorvastatin (CADUET) 5-20 MG tablet, TAKE 1 TABLET BY MOUTH ONCE DAILY, Disp: 90 tablet, Rfl: 1 .  atorvastatin (LIPITOR) 20 MG tablet, Take 1 tablet (20 mg total) by mouth daily., Disp: 90 tablet, Rfl: 3 .  Blood Glucose Monitoring Suppl (ACCU-CHEK AVIVA  PLUS) w/Device KIT, To check blood sugar once daily, Disp: 1 kit, Rfl: 0 .  digoxin (LANOXIN) 0.125 MG tablet, TAKE 1 TABLET BY MOUTH ONCE DAILY, Disp: 90 tablet, Rfl: 1 .  empagliflozin (JARDIANCE) 25 MG TABS tablet, Take 25 mg by mouth daily., Disp: 90 tablet, Rfl: 3 .  ferrous sulfate ([employer]S IRON) 325 (65 FE) MG tablet, Take 325 mg by mouth. , Disp: , Rfl:  .  furosemide (LASIX) 20 MG tablet, Take 1 tablet (20 mg total) by mouth daily., Disp: 90 tablet, Rfl: 1 .  gabapentin (NEURONTIN) 300 MG capsule, TAKE 1 CAPSULE BY MOUTH 3 TIMES A DAY, Disp: 90 capsule, Rfl: 5 .  glipiZIDE (GLUCOTROL XL) 10 MG 24 hr tablet, TAKE 1 TABLET BY MOUTH ONCE A DAY WITH BREAKFAST, Disp: 90 tablet, Rfl: 3 .  lisinopril (PRINIVIL,ZESTRIL) 20 MG tablet, TAKE 1 TABLET BY MOUTH ONCE DAILY, Disp: 90 tablet, Rfl: 3 .  meloxicam (MOBIC) 7.5 MG tablet, TAKE 1 TABLET BY MOUTH TWICE (2) DAILY, Disp: 60 tablet, Rfl: 5 .  metFORMIN (GLUCOPHAGE) 1000 MG tablet, TAKE 1 TABLET BY MOUTH TWICE A DAY WITH A MEAL, Disp: 180 tablet, Rfl: 1 .  nitroGLYCERIN (NITROSTAT) 0.4 MG SL tablet, Place 1 tablet (0.4 mg total) under the tongue every 5 (five) minutes as needed for chest pain., Disp: 25 tablet, Rfl: 6 .    ONGLYZA 5 MG TABS tablet, TAKE 1 TABLET BY MOUTH ONCE DAILY, Disp: 90 tablet, Rfl: 1 .  potassium chloride (K-DUR) 10 MEQ tablet, Take 1 tablet (10 mEq total) by mouth daily., Disp: 90 tablet, Rfl: 1 .  tamsulosin (FLOMAX) 0.4 MG CAPS capsule, Take 1 capsule (0.4 mg total) by mouth daily., Disp: 30 capsule, Rfl: 1  Review of Systems  Constitutional: Negative for appetite change and fatigue.  Respiratory: Negative.   Cardiovascular: Negative.   Gastrointestinal: Negative for abdominal pain.  Genitourinary: Positive for frequency (better).  Musculoskeletal: Negative.   Neurological: Positive for dizziness (sometimes) and weakness (sometimes). Negative for headaches.    Social History   Tobacco Use  . Smoking status: Former  Smoker    Packs/day: 1.00    Years: 4.00    Pack years: 4.00    Types: Cigarettes  . Smokeless tobacco: Current User    Types: Chew  Substance Use Topics  . Alcohol use: No    Alcohol/week: 0.5 oz    Types: 1 Standard drinks or equivalent per week   Objective:   BP (!) 134/50   Pulse 76   Temp 98.2 F (36.8 C)   Resp 14   Wt 176 lb 9.6 oz (80.1 kg)   BMI 25.34 kg/m  Vitals:   07/20/17 1132  BP: (!) 134/50  Pulse: 76  Resp: 14  Temp: 98.2 F (36.8 C)  Weight: 176 lb 9.6 oz (80.1 kg)     Physical Exam  Constitutional: He appears well-developed and well-nourished. No distress.  HENT:  Head: Normocephalic and atraumatic.  Neck: Normal range of motion. Neck supple.  Cardiovascular: Normal rate, regular rhythm and normal heart sounds. Exam reveals no gallop and no friction rub.  No murmur heard. Pulmonary/Chest: Effort normal and breath sounds normal. No respiratory distress. He has no wheezes. He has no rales.  Skin: He is not diaphoretic.  Psychiatric: He has a normal mood and affect. His behavior is normal. Judgment and thought content normal.  Vitals reviewed.      Assessment & Plan:     1. Type 2 diabetes mellitus with complication, without long-term current use of insulin (HCC) Microalbumin 50. On lisinopril. Will recheck A1c as below since dietary changes and weight loss have been made since previous. I will see him back in 3 months.  - POCT UA - Microalbumin - HgB A1c  2. Benign prostatic hyperplasia with urinary hesitancy Improving with flomax. This has been refilled as below.  - tamsulosin (FLOMAX) 0.4 MG CAPS capsule; Take 1 capsule (0.4 mg total) by mouth daily.  Dispense: 30 capsule; Refill: 5  3. Hemoglobin decreased Continue iron supplementation. I will recheck labs today. Follow up in 3 months and may discontinue iron at that time if improved enough. - CBC - ferrous sulfate (CVS IRON) 325 (65 FE) MG tablet; Take 1 tablet (325 mg total) by mouth  daily with breakfast.  Dispense: 30 tablet; Refill: 3  4. Iron deficiency anemia, unspecified iron deficiency anemia type See above medical treatment plan. - ferrous sulfate (CVS IRON) 325 (65 FE) MG tablet; Take 1 tablet (325 mg total) by mouth daily with breakfast.  Dispense: 30 tablet; Refill: 3       Jennifer M Burnette, [name]  [facility] 

## 2017-07-21 LAB — CBC
HEMATOCRIT: 34.4 % — AB (ref 38.5–50.0)
Hemoglobin: 11.6 g/dL — ABNORMAL LOW (ref 13.2–17.1)
MCH: 30.3 pg (ref 27.0–33.0)
MCHC: 33.7 g/dL (ref 32.0–36.0)
MCV: 89.8 fL (ref 80.0–100.0)
MPV: 11.7 fL (ref 7.5–12.5)
PLATELETS: 208 10*3/uL (ref 140–400)
RBC: 3.83 10*6/uL — AB (ref 4.20–5.80)
RDW: 12.1 % (ref 11.0–15.0)
WBC: 8 10*3/uL (ref 3.8–10.8)

## 2017-07-21 LAB — HEMOGLOBIN A1C
Hgb A1c MFr Bld: 8.2 % of total Hgb — ABNORMAL HIGH (ref <5.7)
MEAN PLASMA GLUCOSE: 189 (calc)
eAG (mmol/L): 10.4 (calc)

## 2017-07-22 ENCOUNTER — Telehealth: Payer: Self-pay

## 2017-07-22 DIAGNOSIS — R710 Precipitous drop in hematocrit: Secondary | ICD-10-CM

## 2017-07-22 NOTE — Telephone Encounter (Signed)
LMTCB  Thanks,  -Joseline 

## 2017-07-22 NOTE — Telephone Encounter (Signed)
-----   Message from Margaretann LovelessJennifer M Burnette, [name] sent at 07/22/2017  8:16 AM EST ----- A1c has improved to 8.2 from 8.7! Hemoglobin however continues to decrease slowly despite iron supplementation. I would like to send you to GI for further evaluation if agreeable.

## 2017-07-29 NOTE — Telephone Encounter (Signed)
LMOVM for pt to return call 

## 2017-07-30 NOTE — Telephone Encounter (Signed)
Patient advised as below. Patient verbalizes understanding and is in agreement with treatment plan.  

## 2017-07-30 NOTE — Telephone Encounter (Signed)
Referral placed.

## 2017-08-04 DIAGNOSIS — I2510 Atherosclerotic heart disease of native coronary artery without angina pectoris: Secondary | ICD-10-CM | POA: Insufficient documentation

## 2017-08-04 NOTE — Progress Notes (Signed)
Cardiology Office Note  Date:  08/05/2017   ID:  [name] Gross Sr., DOB 11/19/1939, MRN [id number]  PCP:  Mar Daring, [name]   Chief Complaint  Patient presents with  . other    6 month f/u c/o unable to sleep at bedtime since wife passed away. Meds reviewed verbally with pt.    HPI:  [name] is a very pleasant 77 year old gentleman with  coronary artery disease,  bypass surgery in 2004, occluded graft to the diagonal branch with 3 other patent grafts to the RCA, OM with a LIMA to the LAD by catheterization in 2007  who presents for routine followup Of his coronary artery disease .   Lost wife, 05/2017, she was on hospice 3 months Difficulty sleeping, recently gone back to work Night time worse time, lonely  No significant chest pain on exertion continues his regular exercise Previously reported having some discomfort and swelling in his legs, felt he might have neuropathy.  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  Chronic back pain. Previous cortisone shots in his back   PMH:   has a past medical history of Arthritis, Back pain, Colon polyps, DM2 (diabetes mellitus, type 2) ([place]), GERD (gastroesophageal reflux disease), Glaucoma, Gout, HLD (hyperlipidemia), HTN (hypertension), Neuropathy, and Unstable angina ([place]).  PSH:    Past Surgical History:  Procedure Laterality Date  . BACK SURGERY     Cyst removed  . CORONARY ARTERY BYPASS GRAFT  2004  . KNEE SURGERY      Current Outpatient Medications  Medication Sig Dispense Refill  . ACCU-CHEK AVIVA PLUS test strip USE AS DIRECTED TO CHECK FASTING BLOOD SUGAR EVERY MORNING 100 each 4  . allopurinol (ZYLOPRIM) 300  MG tablet TAKE 1 TABLET BY MOUTH ONCE DAILY 90 tablet 1  . amLODipine-atorvastatin (CADUET) 5-20 MG tablet TAKE 1 TABLET BY MOUTH ONCE DAILY 90 tablet 1  . aspirin 81 MG chewable tablet Chew 81 mg by mouth daily.    Marland Kitchen atorvastatin (LIPITOR) 20 MG tablet Take 1 tablet (20 mg total) by mouth daily. 90 tablet 3  . Blood Glucose Monitoring Suppl (ACCU-CHEK AVIVA PLUS) w/Device KIT To check blood sugar once daily 1 kit 0  . digoxin (LANOXIN) 0.125 MG tablet TAKE 1 TABLET BY MOUTH ONCE DAILY 90 tablet 1  . ferrous sulfate ([facility]S IRON) 325 (65 FE) MG tablet Take 1 tablet (325 mg total) by mouth daily with breakfast. 30 tablet 3  . furosemide (LASIX) 20 MG tablet Take 1 tablet (20 mg total) by mouth daily. 90 tablet 1  . gabapentin (NEURONTIN) 300 MG capsule TAKE 1 CAPSULE BY MOUTH 3 TIMES A DAY 90 capsule 5  . glipiZIDE (GLUCOTROL XL) 10 MG 24 hr tablet TAKE 1 TABLET BY MOUTH ONCE A DAY WITH BREAKFAST 90 tablet 3  . lisinopril (PRINIVIL,ZESTRIL) 20 MG tablet TAKE 1 TABLET BY MOUTH ONCE DAILY 90 tablet 3  . meloxicam (MOBIC) 7.5 MG tablet TAKE 1 TABLET BY MOUTH TWICE (2) DAILY 60 tablet 5  . metFORMIN (GLUCOPHAGE) 1000 MG tablet TAKE 1 TABLET BY MOUTH TWICE A DAY WITH A MEAL 180 tablet 1  . nitroGLYCERIN (NITROSTAT)  0.4 MG SL tablet Place 1 tablet (0.4 mg total) under the tongue every 5 (five) minutes as needed for chest pain. (Patient taking differently: Place 1 tablet (0.4 mg total) under the tongue every 5 (five) minutes as needed for chest pain.) 25 tablet 6  . ONGLYZA 5 MG TABS tablet TAKE 1 TABLET BY MOUTH ONCE DAILY 90 tablet 1  . potassium chloride (K-DUR) 10 MEQ tablet Take 1 tablet (10 mEq total) by mouth daily. 90 tablet 1  . tamsulosin (FLOMAX) 0.4 MG CAPS capsule Take 1 capsule (0.4 mg total) by mouth daily. 30 capsule 5   No current facility-administered medications for this visit.      Allergies:   Ramipril   Social History:  The patient  reports that he has quit smoking. His smoking use  included cigarettes. He has a 4.00 pack-year smoking history. His smokeless tobacco use includes chew. He reports that he does not drink alcohol or use drugs.   Family History:   Family history is unknown by patient.    Review of Systems: Review of Systems  Constitutional: Negative.   Respiratory: Negative.   Cardiovascular: Negative.   Gastrointestinal: Negative.   Musculoskeletal: Positive for back pain.       Right leg numbness  Neurological: Negative.   Psychiatric/Behavioral: The patient has insomnia.   All other systems reviewed and are negative.    PHYSICAL EXAM: VS:  BP (!) 136/58 (BP Location: Left Arm, Patient Position: Sitting, Cuff Size: Normal)   Pulse 65   Ht '5\' 10"'  (1.778 m)   Wt 178 lb 8 oz (81 kg)   BMI 25.61 kg/m  , BMI Body mass index is 25.61 kg/m.  No significant change in physical exam as detailed below GEN: Well nourished, well developed, in no acute distress  HEENT: normal  Neck: no JVD, carotid bruits, or masses Cardiac: RRR; no murmurs, rubs, or gallops,no edema  Respiratory:  clear to auscultation bilaterally, normal work of breathing GI: soft, nontender, nondistended, + BS MS: no deformity or atrophy  Skin: warm and dry, no rash Neuro:  Strength and sensation are intact Psych: euthymic mood, full affect    Recent Labs: 03/19/2017: ALT 21 06/12/2017: BUN 21; Creat 1.43; Potassium 4.7; Sodium 138 07/20/2017: Hemoglobin 11.6; Platelets 208    Lipid Panel Lab Results  Component Value Date   CHOL 99 (L) 03/19/2017   HDL 35 (L) 03/19/2017   LDLCALC 40 03/19/2017   TRIG 120 03/19/2017      Wt Readings from Last 3 Encounters:  08/05/17 178 lb 8 oz (81 kg)  07/20/17 176 lb 9.6 oz (80.1 kg)  06/23/17 179 lb 12.8 oz (81.6 kg)       ASSESSMENT AND PLAN:  Mixed hyperlipidemia Medication confusion  he is on Lipitor twice, will need to cut 1 of these off Lipitor was prescribed separately September 2018 Caduet represcribed by outside  office  October 2018  Leg swelling Suspected secondary to amlodipine  Previously placed on HCTZ Appears this was changed to Lasix Most recent renal function appearing prerenal creatinine above his baseline 1.4 Recommend we hold the Lasix or take this every other day  essential hypertension - Plan: EKG 12-Lead Recommend Lasix every other day Hold the amlodipine for leg swelling, stop the Caduet,  Stay on Lipitor alone  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 No further workup at this time. Continue current medication  regimen.  Type 2 diabetes mellitus with complication, without long-term current use of insulin (HCC) Hemoglobin A1c is elevated Recommended strict diet, dietary guide provided   Total encounter time more than 25 minutes  Greater than 50% was spent in counseling and coordination of care with the patient   Disposition:   F/U  12 months   Orders Placed This Encounter  Procedures  . EKG 12-Lead     Signed, Esmond Plants, M.D., Ph.D. 08/05/2017  [place], [place]

## 2017-08-05 ENCOUNTER — Encounter: Payer: Self-pay | Admitting: Cardiovascular Disease

## 2017-08-05 ENCOUNTER — Ambulatory Visit: Payer: Medicare Other | Admitting: Cardiovascular Disease

## 2017-08-05 ENCOUNTER — Telehealth: Payer: Self-pay | Admitting: Cardiovascular Disease

## 2017-08-05 VITALS — BP 136/58 | HR 65 | Ht 70.0 in | Wt 178.5 lb

## 2017-08-05 DIAGNOSIS — I25118 Atherosclerotic heart disease of native coronary artery with other forms of angina pectoris: Secondary | ICD-10-CM

## 2017-08-05 DIAGNOSIS — E782 Mixed hyperlipidemia: Secondary | ICD-10-CM

## 2017-08-05 DIAGNOSIS — I10 Essential (primary) hypertension: Secondary | ICD-10-CM | POA: Diagnosis not present

## 2017-08-05 DIAGNOSIS — I25718 Atherosclerosis of autologous vein coronary artery bypass graft(s) with other forms of angina pectoris: Secondary | ICD-10-CM

## 2017-08-05 DIAGNOSIS — E118 Type 2 diabetes mellitus with unspecified complications: Secondary | ICD-10-CM

## 2017-08-05 MED ORDER — ATORVASTATIN CALCIUM 20 MG PO TABS: 20.0000 mg | ORAL_TABLET | Freq: Every day | ORAL | 3 refills | Status: DC

## 2017-08-05 NOTE — Patient Instructions (Signed)

## 2017-08-05 NOTE — Telephone Encounter (Signed)
Pt returning our call States he said the voicemail he received was from here about medication    Please call back

## 2017-08-05 NOTE — Progress Notes (Signed)
Called both numbers listed for patient and left voicemail message for him to call back. Just need to review his medications in detail and some changes per Dr. Mariah MillingGollan.

## 2017-08-07 ENCOUNTER — Other Ambulatory Visit: Payer: Self-pay | Admitting: Physician Assistant

## 2017-08-07 DIAGNOSIS — E119 Type 2 diabetes mellitus without complications: Secondary | ICD-10-CM

## 2017-08-07 DIAGNOSIS — I10 Essential (primary) hypertension: Secondary | ICD-10-CM

## 2017-08-07 DIAGNOSIS — E7800 Pure hypercholesterolemia, unspecified: Secondary | ICD-10-CM

## 2017-08-17 ENCOUNTER — Encounter: Payer: Self-pay | Admitting: Gastroenterology

## 2017-08-17 ENCOUNTER — Other Ambulatory Visit: Payer: Self-pay

## 2017-08-17 ENCOUNTER — Ambulatory Visit: Payer: Medicare Other | Admitting: Gastroenterology

## 2017-08-17 VITALS — BP 172/90 | HR 74 | Temp 98.3°F | Ht 68.0 in | Wt 179.0 lb

## 2017-08-17 DIAGNOSIS — D649 Anemia, unspecified: Secondary | ICD-10-CM

## 2017-08-17 NOTE — Progress Notes (Signed)
 Rohini R Vanga, MD [address]  [address]  [place],  [address]  Main: [phone]  Fax: [phone]    Gastroenterology Consultation  Referring Provider:     Burnette, Jennifer M, P* Primary Care Physician:  Burnette, Jennifer M, [name] Primary Gastroenterologist:  Dr. Rohini R Vanga Reason for Consultation:     Anemia        HPI:   [name] Hendrick Rugg Sr. is a 77 y.o. male referred by Dr. Burnette, Jennifer M, [name]  for consultation & management of normocytic anemia. He has history of cardiac bypass in 2004, status postcardiac catheter in 2007, on aspirin 81. He reports taking oral iron daily for long-term. His latest hemoglobin is 11.6, MCV 89.8 on 07/20/2017. Most recent normal hemoglobin is 12.8 in 02/2016. His wife recently passed away in November. Patient denies any GI complaints other than having dark formed bowel movement on iron. He reports having had a colonoscopy done less than 10 years ago but could not recall where it was exactly done. He denies rectal bleeding, hematochezia. His most recent creatinine was 1.43 in 05/2017. He is accompanied by his daughter today  NSAIDs: None  Antiplts/Anticoagulants/Anti thrombotics: Aspirin 81  GI Procedures: Reports having had colonoscopies in the past Results not available Denies having any abdominal surgeries He does not smoke or drink alcohol  Past Medical History:  Diagnosis Date  . Arthritis   . Back pain    hx  . Colon polyps   . DM2 (diabetes mellitus, type 2) ([facility])    stable  . GERD (gastroesophageal reflux disease)   . Glaucoma   . Gout   . HLD (hyperlipidemia)   . HTN (hypertension)   . Neuropathy   . Unstable angina ([facility])    resolved. a. negative myocardial infarction    Past Surgical History:  Procedure Laterality Date  . BACK SURGERY     Cyst removed  . CORONARY ARTERY BYPASS GRAFT  2004  . KNEE SURGERY      Prior to Admission medications   Medication Sig Start Date End Date  Taking? Authorizing Provider  ACCU-CHEK AVIVA PLUS test strip USE AS DIRECTED TO CHECK FASTING BLOOD SUGAR EVERY MORNING 05/11/17   Burnette, Jennifer M, [name]  allopurinol (ZYLOPRIM) 300 MG tablet TAKE 1 TABLET BY MOUTH ONCE DAILY 04/06/17   Burnette, Jennifer M, [name]  amLODipine-atorvastatin (CADUET) 5-20 MG tablet TAKE 1 TABLET BY MOUTH ONCE A DAY 08/07/17   Burnette, Jennifer M, [name]  aspirin 81 MG chewable tablet Chew 81 mg by mouth daily.    [provider]  atorvastatin (LIPITOR) 20 MG tablet Take 1 tablet (20 mg total) by mouth daily. 08/05/17   Gollan, Timothy J, MD  Blood Glucose Monitoring Suppl (ACCU-CHEK AVIVA PLUS) w/Device KIT To check blood sugar once daily 06/11/17   Burnette, Jennifer M, [name]  digoxin (LANOXIN) 0.125 MG tablet TAKE 1 TABLET BY MOUTH ONCE DAILY 04/28/17   Burnette, Jennifer M, [name]  ferrous sulfate (CVS IRON) 325 (65 FE) MG tablet Take 1 tablet (325 mg total) by mouth daily with breakfast. 07/20/17   Burnette, Jennifer M, [name]  furosemide (LASIX) 20 MG tablet Take 1 tablet (20 mg total) by mouth daily. 03/19/17   Burnette, Jennifer M, [name]  gabapentin (NEURONTIN) 300 MG capsule TAKE 1 CAPSULE BY MOUTH 3 TIMES A DAY 04/20/17   Burnette, Jennifer M, [name]  glipiZIDE (GLUCOTROL XL) 10 MG 24 hr tablet TAKE 1 TABLET BY MOUTH ONCE A DAY WITH BREAKFAST   06/10/17   Burnette, Jennifer M, [name]  lisinopril (PRINIVIL,ZESTRIL) 20 MG tablet TAKE 1 TABLET BY MOUTH ONCE DAILY 06/08/17   Burnette, Jennifer M, [name]  meloxicam (MOBIC) 7.5 MG tablet TAKE 1 TABLET BY MOUTH TWICE (2) DAILY 02/10/17   Burnette, Jennifer M, [name]  metFORMIN (GLUCOPHAGE) 1000 MG tablet TAKE 1 TABLET BY MOUTH TWICE A DAY WITH A MEAL 05/12/17   Burnette, Jennifer M, [name]  nitroGLYCERIN (NITROSTAT) 0.4 MG SL tablet Place 1 tablet (0.4 mg total) under the tongue every 5 (five) minutes as needed for chest pain. Patient taking differently: Place 1 tablet (0.4 mg total) under the tongue every 5 (five) minutes as  needed for chest pain. 04/27/13   Gollan, Timothy J, MD  ONGLYZA 5 MG TABS tablet TAKE 1 TABLET BY MOUTH ONCE DAILY 08/07/17   Burnette, Jennifer M, [name]  potassium chloride (K-DUR) 10 MEQ tablet Take 1 tablet (10 mEq total) by mouth daily. 03/19/17   Burnette, Jennifer M, [name]  tamsulosin (FLOMAX) 0.4 MG CAPS capsule Take 1 capsule (0.4 mg total) by mouth daily. 07/20/17   Burnette, Jennifer M, [name]    Family History  Family history unknown: Yes     Social History   Tobacco Use  . Smoking status: Former Smoker    Packs/day: 1.00    Years: 4.00    Pack years: 4.00    Types: Cigarettes  . Smokeless tobacco: Current User    Types: Chew  Substance Use Topics  . Alcohol use: No    Alcohol/week: 0.5 oz    Types: 1 Standard drinks or equivalent per week  . Drug use: No    Allergies as of 08/17/2017 - Review Complete 08/17/2017  Allergen Reaction Noted  . Ramipril  05/24/2010    Review of Systems:    All systems reviewed and negative except where noted in HPI.   Physical Exam:  BP (!) 172/90   Pulse 74   Temp 98.3 F (36.8 C) (Oral)   Ht 5' 8" (1.727 m)   Wt 179 lb (81.2 kg)   BMI 27.22 kg/m  No LMP for male patient.  General:   Alert,  Well-developed, well-nourished, pleasant and cooperative in NAD Head:  Normocephalic and atraumatic. Eyes:  Sclera clear, no icterus.   Conjunctiva pink. Ears:  Normal auditory acuity. Nose:  No deformity, discharge, or lesions. Mouth:  No deformity or lesions,oropharynx pink & moist. Neck:  Supple; no masses or thyromegaly. Lungs:  Respirations even and unlabored.  Clear throughout to auscultation.   No wheezes, crackles, or rhonchi. No acute distress. Heart:  Regular rate and rhythm; no murmurs, clicks, rubs, or gallops. Abdomen:  Normal bowel sounds. Soft, non-tender and non-distended without masses, hepatosplenomegaly or hernias noted.  No guarding or rebound tenderness.   Rectal: Not performed Msk:  Symmetrical without gross  deformities. Good, equal movement & strength bilaterally. Pulses:  Normal pulses noted. Extremities:  No clubbing or edema.  No cyanosis. Neurologic:  Alert and oriented x3;  grossly normal neurologically. Skin:  Intact without significant lesions or rashes. No jaundice. Lymph Nodes:  No significant cervical adenopathy. Psych:  Alert and cooperative. Normal mood and affect.  Imaging Studies: None  Assessment and Plan:   [name] Hendrick Rubert Sr. is a 77 y.o. male with coronary artery disease status post bypass in 2004, on aspirin 81 mg daily presents for further evaluation of normocytic anemia. He is chronically on oral iron daily. There is no evidence of active GI bleed. Recommend further   workup of anemia. He did have elevated creatinine in 05/2017  - Check ferritin, iron studies, B12 and folate - Recheck CBC, BMP as creatinine was previously elevated - Further recommendations based on above results   Follow up in 4 weeks   Rohini R Vanga, MD 

## 2017-08-18 ENCOUNTER — Telehealth: Payer: Self-pay

## 2017-08-18 LAB — HEMOGLOBIN A1C
ESTIMATED AVERAGE GLUCOSE: 183 mg/dL
Hgb A1c MFr Bld: 8 % — ABNORMAL HIGH (ref 4.8–5.6)

## 2017-08-18 NOTE — Telephone Encounter (Signed)
-----   Message from Margaretann LovelessJennifer M Burnette, [name] sent at 08/18/2017  8:17 AM EST ----- A1c continues to come down slowly. Is down to 8.0 from 8.2. Continue all medications and lifestyle modifications.

## 2017-08-18 NOTE — Telephone Encounter (Signed)
Patient advised as below.  

## 2017-08-18 NOTE — Telephone Encounter (Signed)
lmtcb

## 2017-09-09 ENCOUNTER — Other Ambulatory Visit: Payer: Self-pay | Admitting: Physician Assistant

## 2017-09-09 DIAGNOSIS — M2550 Pain in unspecified joint: Secondary | ICD-10-CM

## 2017-09-14 ENCOUNTER — Ambulatory Visit: Payer: Medicare Other | Admitting: Physician Assistant

## 2017-09-14 ENCOUNTER — Encounter (INDEPENDENT_AMBULATORY_CARE_PROVIDER_SITE_OTHER): Payer: Self-pay

## 2017-09-14 ENCOUNTER — Ambulatory Visit (INDEPENDENT_AMBULATORY_CARE_PROVIDER_SITE_OTHER): Payer: Medicare Other | Admitting: Gastroenterology

## 2017-09-14 ENCOUNTER — Other Ambulatory Visit: Payer: Self-pay

## 2017-09-14 ENCOUNTER — Other Ambulatory Visit
Admission: RE | Admit: 2017-09-14 | Discharge: 2017-09-14 | Disposition: A | Discharge status: Home / Self Care | Payer: Medicare Other | Source: Ambulatory Visit | Attending: Gastroenterology | Admitting: Gastroenterology

## 2017-09-14 ENCOUNTER — Encounter: Payer: Self-pay | Admitting: Gastroenterology

## 2017-09-14 VITALS — BP 174/80 | HR 62 | Temp 97.7°F | Ht 68.0 in | Wt 179.4 lb

## 2017-09-14 DIAGNOSIS — D509 Iron deficiency anemia, unspecified: Secondary | ICD-10-CM

## 2017-09-14 DIAGNOSIS — D649 Anemia, unspecified: Secondary | ICD-10-CM | POA: Insufficient documentation

## 2017-09-14 LAB — IRON AND TIBC
IRON: 84 ug/dL (ref 45–182)
SATURATION RATIOS: 24 % (ref 17.9–39.5)
TIBC: 354 ug/dL (ref 250–450)
UIBC: 270 ug/dL

## 2017-09-14 LAB — FOLATE: Folate: 17.4 ng/mL (ref >5.9)

## 2017-09-14 LAB — BASIC METABOLIC PANEL
Anion gap: 10 (ref 5–15)
BUN: 10 mg/dL (ref 6–20)
CHLORIDE: 101 mmol/L (ref 101–111)
CO2: 26 mmol/L (ref 22–32)
Calcium: 9.4 mg/dL (ref 8.9–10.3)
Creatinine, Ser: 1.1 mg/dL (ref 0.61–1.24)
GFR calc Af Amer: >60 mL/min (ref >60)
GFR calc non Af Amer: >60 mL/min (ref >60)
Glucose, Bld: 162 mg/dL — ABNORMAL HIGH (ref 65–99)
POTASSIUM: 4.4 mmol/L (ref 3.5–5.1)
SODIUM: 137 mmol/L (ref 135–145)

## 2017-09-14 LAB — CBC
HEMATOCRIT: 39.7 % — AB (ref 40.0–52.0)
HEMOGLOBIN: 13.1 g/dL (ref 13.0–18.0)
MCH: 30.4 pg (ref 26.0–34.0)
MCHC: 33.1 g/dL (ref 32.0–36.0)
MCV: 91.7 fL (ref 80.0–100.0)
Platelets: 226 10*3/uL (ref 150–440)
RBC: 4.32 MIL/uL — AB (ref 4.40–5.90)
RDW: 13.7 % (ref 11.5–14.5)
WBC: 6.7 10*3/uL (ref 3.8–10.6)

## 2017-09-14 LAB — FERRITIN: Ferritin: 15 ng/mL — ABNORMAL LOW (ref 24–336)

## 2017-09-14 LAB — VITAMIN B12: VITAMIN B 12: 69 pg/mL — AB (ref 180–914)

## 2017-09-14 NOTE — Progress Notes (Deleted)
 Adanna Zuckerman R Sandon Yoho, MD [address]  [address]  [place], [place] [address]  Main: [phone]  Fax: [phone]    Gastroenterology Consultation  Referring Provider:     Burnette, Jennifer M, P* Primary Care Physician:  Burnette, Jennifer M, [name] Primary Gastroenterologist:  Dr. Fronie Holstein R Rickell Wiehe Reason for Consultation:     Anemia        HPI:   [name] Hendrick Krysiak Sr. is a 77 y.o. male referred by Dr. Burnette, Jennifer M, [name]  for consultation & management of normocytic anemia. He has history of cardiac bypass in 2004, status postcardiac catheter in 2007, on aspirin 81. He reports taking oral iron daily for long-term. His latest hemoglobin is 11.6, MCV 89.8 on 07/20/2017. Most recent normal hemoglobin is 12.8 in 02/2016. His wife recently passed away in November. Patient denies any GI complaints other than having dark formed bowel movement on iron. He reports having had a colonoscopy done less than 10 years ago but could not recall where it was exactly done. He denies rectal bleeding, hematochezia. His most recent creatinine was 1.43 in 05/2017. He is accompanied by his daughter today  NSAIDs: None  Antiplts/Anticoagulants/Anti thrombotics: Aspirin 81  GI Procedures: Reports having had colonoscopies in the past Results not available Denies having any abdominal surgeries He does not smoke or drink alcohol  Past Medical History:  Diagnosis Date  . Arthritis   . Back pain    hx  . Colon polyps   . DM2 (diabetes mellitus, type 2) ([facility])    stable  . GERD (gastroesophageal reflux disease)   . Glaucoma   . Gout   . HLD (hyperlipidemia)   . HTN (hypertension)   . Neuropathy   . Unstable angina ([facility])    resolved. a. negative myocardial infarction    Past Surgical History:  Procedure Laterality Date  . BACK SURGERY     Cyst removed  . CORONARY ARTERY BYPASS GRAFT  2004  . KNEE SURGERY      Prior to Admission medications   Medication Sig Start Date End Date  Taking? Authorizing Provider  ACCU-CHEK AVIVA PLUS test strip USE AS DIRECTED TO CHECK FASTING BLOOD SUGAR EVERY MORNING 05/11/17   Burnette, Jennifer M, [name]  allopurinol (ZYLOPRIM) 300 MG tablet TAKE 1 TABLET BY MOUTH ONCE DAILY 04/06/17   Burnette, Jennifer M, [name]  amLODipine-atorvastatin (CADUET) 5-20 MG tablet TAKE 1 TABLET BY MOUTH ONCE A DAY 08/07/17   Burnette, Jennifer M, [name]  aspirin 81 MG chewable tablet Chew 81 mg by mouth daily.    [provider]  atorvastatin (LIPITOR) 20 MG tablet Take 1 tablet (20 mg total) by mouth daily. 08/05/17   Gollan, Timothy J, MD  Blood Glucose Monitoring Suppl (ACCU-CHEK AVIVA PLUS) w/Device KIT To check blood sugar once daily 06/11/17   Burnette, Jennifer M, [name]  digoxin (LANOXIN) 0.125 MG tablet TAKE 1 TABLET BY MOUTH ONCE DAILY 04/28/17   Burnette, Jennifer M, [name]  ferrous sulfate ([facility]S IRON) 325 (65 FE) MG tablet Take 1 tablet (325 mg total) by mouth daily with breakfast. 07/20/17   Burnette, Jennifer M, [name]  furosemide (LASIX) 20 MG tablet Take 1 tablet (20 mg total) by mouth daily. 03/19/17   Burnette, Jennifer M, [name]  gabapentin (NEURONTIN) 300 MG capsule TAKE 1 CAPSULE BY MOUTH 3 TIMES A DAY 04/20/17   Burnette, Jennifer M, [name]  glipiZIDE (GLUCOTROL XL) 10 MG 24 hr tablet TAKE 1 TABLET BY MOUTH ONCE A DAY WITH BREAKFAST   06/10/17   Mar Daring, [name]  lisinopril (PRINIVIL,ZESTRIL) 20 MG tablet TAKE 1 TABLET BY MOUTH ONCE DAILY 06/08/17   Mar Daring, [name]  meloxicam (MOBIC) 7.5 MG tablet TAKE 1 TABLET BY MOUTH TWICE (2) DAILY 02/10/17   Mar Daring, [name]  metFORMIN (GLUCOPHAGE) 1000 MG tablet TAKE 1 TABLET BY MOUTH TWICE A DAY WITH A MEAL 05/12/17   Fenton Malling M, [name]  nitroGLYCERIN (NITROSTAT) 0.4 MG SL tablet Place 1 tablet (0.4 mg total) under the tongue every 5 (five) minutes as needed for chest pain. Patient taking differently: Place 1 tablet (0.4 mg total) under the tongue every 5 (five) minutes as  needed for chest pain. 04/27/13   Minna Merritts, MD  ONGLYZA 5 MG TABS tablet TAKE 1 TABLET BY MOUTH ONCE DAILY 08/07/17   Mar Daring, [name]  potassium chloride (K-DUR) 10 MEQ tablet Take 1 tablet (10 mEq total) by mouth daily. 03/19/17   Mar Daring, [name]  tamsulosin (FLOMAX) 0.4 MG CAPS capsule Take 1 capsule (0.4 mg total) by mouth daily. 07/20/17   Mar Daring, [name]    Family History  Family history unknown: Yes     Social History   Tobacco Use  . Smoking status: Former Smoker    Packs/day: 1.00    Years: 4.00    Pack years: 4.00    Types: Cigarettes  . Smokeless tobacco: Current User    Types: Chew  Substance Use Topics  . Alcohol use: No    Alcohol/week: 0.5 oz    Types: 1 Standard drinks or equivalent per week  . Drug use: No    Allergies as of 09/14/2017 - Review Complete 09/14/2017  Allergen Reaction Noted  . Ramipril  05/24/2010    Review of Systems:    All systems reviewed and negative except where noted in HPI.   Physical Exam:  BP (!) 174/80   Pulse 62   Temp 97.7 F (36.5 C) (Oral)   Ht '5\' 8"'$  (1.727 m)   Wt 179 lb 6.4 oz (81.4 kg)   BMI 27.28 kg/m  No LMP for male patient.  General:   Alert,  Well-developed, well-nourished, pleasant and cooperative in NAD Head:  Normocephalic and atraumatic. Eyes:  Sclera clear, no icterus.   Conjunctiva pink. Ears:  Normal auditory acuity. Nose:  No deformity, discharge, or lesions. Mouth:  No deformity or lesions,oropharynx pink & moist. Neck:  Supple; no masses or thyromegaly. Lungs:  Respirations even and unlabored.  Clear throughout to auscultation.   No wheezes, crackles, or rhonchi. No acute distress. Heart:  Regular rate and rhythm; no murmurs, clicks, rubs, or gallops. Abdomen:  Normal bowel sounds. Soft, non-tender and non-distended without masses, hepatosplenomegaly or hernias noted.  No guarding or rebound tenderness.   Rectal: Not performed Msk:  Symmetrical without gross  deformities. Good, equal movement & strength bilaterally. Pulses:  Normal pulses noted. Extremities:  No clubbing or edema.  No cyanosis. Neurologic:  Alert and oriented x3;  grossly normal neurologically. Skin:  Intact without significant lesions or rashes. No jaundice. Lymph Nodes:  No significant cervical adenopathy. Psych:  Alert and cooperative. Normal mood and affect.  Imaging Studies: None  Assessment and Plan:   [name] Wolfinger Sr. is a 77 y.o. male with coronary artery disease status post bypass in 2004, on aspirin 81 mg daily presents for further evaluation of normocytic anemia. He is chronically on oral iron daily. There is no evidence of active GI bleed.  Recommend further workup of anemia. He did have elevated creatinine in 05/2017  - Check ferritin, iron studies, B12 and folate - Recheck CBC, BMP as creatinine was previously elevated - Further recommendations based on above results   Follow up in 4 weeks   Cephas Darby, MD

## 2017-09-15 ENCOUNTER — Other Ambulatory Visit: Payer: Self-pay

## 2017-09-15 MED ORDER — CYANOCOBALAMIN 1000 MCG/ML IJ SOLN: INTRAMUSCULAR | 3 refills | Status: DC

## 2017-09-15 MED ORDER — "SYRINGE/NEEDLE (DISP) 25G X 5/8"" 3 ML MISC": 0 refills | Status: DC

## 2017-09-28 ENCOUNTER — Other Ambulatory Visit: Payer: Self-pay | Admitting: Physician Assistant

## 2017-09-28 DIAGNOSIS — R600 Localized edema: Secondary | ICD-10-CM

## 2017-09-29 ENCOUNTER — Encounter: Payer: Self-pay | Admitting: Physician Assistant

## 2017-09-29 ENCOUNTER — Ambulatory Visit: Payer: Medicare Other | Admitting: Physician Assistant

## 2017-09-29 VITALS — BP 144/76 | HR 76 | Temp 97.6°F | Resp 16 | Wt 176.0 lb

## 2017-09-29 DIAGNOSIS — G629 Polyneuropathy, unspecified: Secondary | ICD-10-CM

## 2017-09-29 MED ORDER — GABAPENTIN 400 MG PO CAPS: 400.0000 mg | ORAL_CAPSULE | Freq: Three times a day (TID) | ORAL | 0 refills | Status: DC

## 2017-09-29 NOTE — Patient Instructions (Signed)
Gabapentin capsules or tablets What is this medicine? GABAPENTIN (GA ba pen tin) is used to control partial seizures in adults with epilepsy. It is also used to treat certain types of nerve pain. This medicine may be used for other purposes; ask your health care provider or pharmacist if you have questions. COMMON BRAND NAME(S): Active-PAC with Gabapentin, Gabarone, Neurontin What should I tell my health care provider before I take this medicine? They need to know if you have any of these conditions: -kidney disease -suicidal thoughts, plans, or attempt; a previous suicide attempt by you or a family member -an unusual or allergic reaction to gabapentin, other medicines, foods, dyes, or preservatives -pregnant or trying to get pregnant -breast-feeding How should I use this medicine? Take this medicine by mouth with a glass of water. Follow the directions on the prescription label. You can take it with or without food. If it upsets your stomach, take it with food.Take your medicine at regular intervals. Do not take it more often than directed. Do not stop taking except on your doctor's advice. If you are directed to break the 600 or 800 mg tablets in half as part of your dose, the extra half tablet should be used for the next dose. If you have not used the extra half tablet within 28 days, it should be thrown away. A special MedGuide will be given to you by the pharmacist with each prescription and refill. Be sure to read this information carefully each time. Talk to your pediatrician regarding the use of this medicine in children. Special care may be needed. Overdosage: If you think you have taken too much of this medicine contact a poison control center or emergency room at once. NOTE: This medicine is only for you. Do not share this medicine with others. What if I miss a dose? If you miss a dose, take it as soon as you can. If it is almost time for your next dose, take only that dose. Do not  take double or extra doses. What may interact with this medicine? Do not take this medicine with any of the following medications: -other gabapentin products This medicine may also interact with the following medications: -alcohol -antacids -antihistamines for allergy, cough and cold -certain medicines for anxiety or sleep -certain medicines for depression or psychotic disturbances -homatropine; hydrocodone -naproxen -narcotic medicines (opiates) for pain -phenothiazines like chlorpromazine, mesoridazine, prochlorperazine, thioridazine This list may not describe all possible interactions. Give your health care provider a list of all the medicines, herbs, non-prescription drugs, or dietary supplements you use. Also tell them if you smoke, drink alcohol, or use illegal drugs. Some items may interact with your medicine. What should I watch for while using this medicine? Visit your doctor or health care professional for regular checks on your progress. You may want to keep a record at home of how you feel your condition is responding to treatment. You may want to share this information with your doctor or health care professional at each visit. You should contact your doctor or health care professional if your seizures get worse or if you have any new types of seizures. Do not stop taking this medicine or any of your seizure medicines unless instructed by your doctor or health care professional. Stopping your medicine suddenly can increase your seizures or their severity. Wear a medical identification bracelet or chain if you are taking this medicine for seizures, and carry a card that lists all your medications. You may get drowsy, dizzy,   or have blurred vision. Do not drive, use machinery, or do anything that needs mental alertness until you know how this medicine affects you. To reduce dizzy or fainting spells, do not sit or stand up quickly, especially if you are an older patient. Alcohol can  increase drowsiness and dizziness. Avoid alcoholic drinks. Your mouth may get dry. Chewing sugarless gum or sucking hard candy, and drinking plenty of water will help. The use of this medicine may increase the chance of suicidal thoughts or actions. Pay special attention to how you are responding while on this medicine. Any worsening of mood, or thoughts of suicide or dying should be reported to your health care professional right away. Women who become pregnant while using this medicine may enroll in the [place] Antiepileptic Drug Pregnancy Registry by calling 1-888-233-2334. This registry collects information about the safety of antiepileptic drug use during pregnancy. What side effects may I notice from receiving this medicine? Side effects that you should report to your doctor or health care professional as soon as possible: -allergic reactions like skin rash, itching or hives, swelling of the face, lips, or tongue -worsening of mood, thoughts or actions of suicide or dying Side effects that usually do not require medical attention (report to your doctor or health care professional if they continue or are bothersome): -constipation -difficulty walking or controlling muscle movements -dizziness -nausea -slurred speech -tiredness -tremors -weight gain This list may not describe all possible side effects. Call your doctor for medical advice about side effects. You may report side effects to FDA at 1-800-FDA-1088. Where should I keep my medicine? Keep out of reach of children. This medicine may cause accidental overdose and death if it taken by other adults, children, or pets. Mix any unused medicine with a substance like cat litter or coffee grounds. Then throw the medicine away in a sealed container like a sealed bag or a coffee can with a lid. Do not use the medicine after the expiration date. Store at room temperature between 15 and 30 degrees C (59 and 86 degrees F). NOTE: This  sheet is a summary. It may not cover all possible information. If you have questions about this medicine, talk to your doctor, pharmacist, or health care provider.  2018 Elsevier/Gold Standard (2013-09-09 15:26:50)  

## 2017-09-29 NOTE — Progress Notes (Signed)
Patient: [name] Jakubiak Sr. Male    DOB: 11/19/1939   77 y.o.   MRN: [id number] Visit Date: 09/29/2017  Today's Provider: Mar Daring, [name]   Chief Complaint  Patient presents with  . Leg Pain   Subjective:    Leg Pain   There was no injury mechanism. The pain is present in the left leg and right leg. The quality of the pain is described as aching and shooting. The pain is moderate (can be severe). The pain has been constant since onset. Associated symptoms include an inability to bear weight, muscle weakness and numbness. Pertinent negatives include no tingling. The symptoms are aggravated by movement and weight bearing. He has tried rest for the symptoms.   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 He reports that he was prescribed Gabapentin 392m TID for the symptoms. He did have symptom improvement for a little while, but reports symptoms have worsened over last 2 weeks. He is also followed by a pain clinic in [place] Was last seen by them 2-3 weeks ago and reports he didn't say anything to them since he wasn't having pain then. He follows up with them every 3 months. Reports it has been over 9 months since his last back injection so he may need another shot in his back also.     Allergies  Allergen Reactions  . Ramipril      Current Outpatient Medications:  .  ACCU-CHEK AVIVA PLUS test strip, USE AS DIRECTED TO CHECK FASTING BLOOD SUGAR EVERY MORNING, Disp: 100 each, Rfl: 4 .  allopurinol (ZYLOPRIM) 300 MG tablet, TAKE 1 TABLET BY MOUTH ONCE DAILY, Disp: 90 tablet, Rfl: 1 .  amLODipine-atorvastatin (CADUET) 5-20 MG tablet, TAKE 1 TABLET BY MOUTH ONCE A DAY, Disp: 90 tablet, Rfl: 1 .  aspirin 81 MG chewable tablet, Chew 81 mg by mouth daily., Disp: , Rfl:  .  atorvastatin (LIPITOR) 20 MG tablet, Take 1 tablet (20 mg total) by mouth daily., Disp: 90 tablet, Rfl: 3 .  Blood Glucose Monitoring Suppl (ACCU-CHEK AVIVA PLUS) w/Device KIT, To check  blood sugar once daily, Disp: 1 kit, Rfl: 0 .  cyanocobalamin (,VITAMIN B-12,) 1000 MCG/ML injection, Inject 10066m daily for 7 days, then Inject 100062monce per week for 8 weeks, then inject 1000m66monthly, Disp: 10 mL, Rfl: 3 .  digoxin (LANOXIN) 0.125 MG tablet, TAKE 1 TABLET BY MOUTH ONCE DAILY, Disp: 90 tablet, Rfl: 1 .  ferrous sulfate ([facility] IRON) 325 (65 FE) MG tablet, Take 1 tablet (325 mg total) by mouth daily with breakfast., Disp: 30 tablet, Rfl: 3 .  furosemide (LASIX) 20 MG tablet, TAKE 1 TABLET BY MOUTH ONCE DAILY, Disp: 90 tablet, Rfl: 1 .  gabapentin (NEURONTIN) 300 MG capsule, TAKE 1 CAPSULE BY MOUTH 3 TIMES A DAY, Disp: 90 capsule, Rfl: 5 .  glipiZIDE (GLUCOTROL XL) 10 MG 24 hr tablet, TAKE 1 TABLET BY MOUTH ONCE A DAY WITH BREAKFAST, Disp: 90 tablet, Rfl: 3 .  lisinopril (PRINIVIL,ZESTRIL) 20 MG tablet, TAKE 1 TABLET BY MOUTH ONCE DAILY, Disp: 90 tablet, Rfl: 3 .  meloxicam (MOBIC) 7.5 MG tablet, TAKE 1 TABLET BY MOUTH TWICE (2) DAILY, Disp: 60 tablet, Rfl: 5 .  metFORMIN (GLUCOPHAGE) 1000 MG tablet, TAKE 1 TABLET BY MOUTH TWICE A DAY WITH A MEAL, Disp: 180 tablet, Rfl: 1 .  nitroGLYCERIN (NITROSTAT) 0.4 MG SL tablet, Place 1 tablet (0.4 mg total) under the tongue every 5 (five)  minutes as needed for chest pain. (Patient taking differently: Place 1 tablet (0.4 mg total) under the tongue every 5 (five) minutes as needed for chest pain.), Disp: 25 tablet, Rfl: 6 .  ONGLYZA 5 MG TABS tablet, TAKE 1 TABLET BY MOUTH ONCE DAILY, Disp: 90 tablet, Rfl: 1 .  potassium chloride (K-DUR) 10 MEQ tablet, Take 1 tablet (10 mEq total) by mouth daily., Disp: 90 tablet, Rfl: 1 .  SYRINGE-NEEDLE, DISP, 3 ML (B-D SYRINGE/NEEDLE 3CC/25GX5/8) 25G X 5/8" 3 ML MISC, For use with cyanocobalamin injections, Disp: 50 each, Rfl: 0 .  tamsulosin (FLOMAX) 0.4 MG CAPS capsule, Take 1 capsule (0.4 mg total) by mouth daily., Disp: 30 capsule, Rfl: 5  Review of Systems  Respiratory: Negative.   Cardiovascular:  Negative for chest pain, palpitations and leg swelling.  Musculoskeletal: Positive for arthralgias, back pain, gait problem and myalgias. Negative for joint swelling, neck pain and neck stiffness.  Neurological: Positive for weakness and numbness. Negative for tingling.    Social History   Tobacco Use  . Smoking status: Former Smoker    Packs/day: 1.00    Years: 4.00    Pack years: 4.00    Types: Cigarettes  . Smokeless tobacco: Current User    Types: Chew  Substance Use Topics  . Alcohol use: No    Alcohol/week: 0.5 oz    Types: 1 Standard drinks or equivalent per week   Objective:   BP (!) 144/76 (BP Location: Right Arm, Patient Position: Sitting, Cuff Size: Normal)   Pulse 76   Temp 97.6 F (36.4 C)   Resp 16   Wt 176 lb (79.8 kg)   SpO2 96%   BMI 26.76 kg/m  Vitals:   09/29/17 1117  BP: (!) 144/76  Pulse: 76  Resp: 16  Temp: 97.6 F (36.4 C)  SpO2: 96%  Weight: 176 lb (79.8 kg)     Physical Exam  Constitutional: He appears well-developed and well-nourished. No distress.  HENT:  Head: Normocephalic and atraumatic.  Neck: Normal range of motion. Neck supple. No tracheal deviation present. No thyromegaly present.  Cardiovascular: Normal rate, regular rhythm and normal heart sounds. Exam reveals no gallop and no friction rub.  No murmur heard. Pulmonary/Chest: Effort normal and breath sounds normal. No respiratory distress. He has no wheezes. He has no rales.  Lymphadenopathy:    He has no cervical adenopathy.  Skin: He is not diaphoretic.  Vitals reviewed.      Assessment & Plan:     1. 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 Will increase gabapentin to 447m TID as below. I will see him back in 4 weeks to see if this is working better. May need to push up next appt with pain clinic for consideration of ESI.  - gabapentin (NEURONTIN) 400 MG capsule; Take 1 capsule (400 mg total) by mouth 3 (three) times daily.   Dispense: 90 capsule; Refill: 0       JMar Daring [name]  [facility]

## 2017-10-02 ENCOUNTER — Other Ambulatory Visit: Payer: Self-pay | Admitting: Physician Assistant

## 2017-10-02 DIAGNOSIS — R600 Localized edema: Secondary | ICD-10-CM

## 2017-10-19 ENCOUNTER — Ambulatory Visit: Payer: Medicare Other | Admitting: Physician Assistant

## 2017-10-19 ENCOUNTER — Encounter: Payer: Self-pay | Admitting: Physician Assistant

## 2017-10-19 VITALS — BP 150/70 | HR 66 | Temp 98.5°F | Resp 16 | Wt 178.0 lb

## 2017-10-19 DIAGNOSIS — G629 Polyneuropathy, unspecified: Secondary | ICD-10-CM | POA: Diagnosis not present

## 2017-10-19 DIAGNOSIS — E118 Type 2 diabetes mellitus with unspecified complications: Secondary | ICD-10-CM

## 2017-10-19 DIAGNOSIS — D513 Other dietary vitamin B12 deficiency anemia: Secondary | ICD-10-CM

## 2017-10-19 LAB — POCT GLYCOSYLATED HEMOGLOBIN (HGB A1C)
ESTIMATED AVERAGE GLUCOSE: 166
HEMOGLOBIN A1C: 7.4

## 2017-10-19 MED ORDER — "SYRINGE/NEEDLE (DISP) 25G X 5/8"" 3 ML MISC": 0 refills | Status: DC

## 2017-10-19 MED ORDER — CYANOCOBALAMIN 1000 MCG/ML IJ SOLN: INTRAMUSCULAR | 3 refills | Status: DC

## 2017-10-19 MED ORDER — BD SHARPS CONTAINER HOME MISC
5 refills | Status: AC
Start: 2017-10-19 — End: ?

## 2017-10-19 NOTE — Patient Instructions (Signed)
Diabetes Mellitus and Nutrition When you have diabetes (diabetes mellitus), it is very important to have healthy eating habits because your blood sugar (glucose) levels are greatly affected by what you eat and drink. Eating healthy foods in the appropriate amounts, at about the same times every day, can help you:  Control your blood glucose.  Lower your risk of heart disease.  Improve your blood pressure.  Reach or maintain a healthy weight.  Every person with diabetes is different, and each person has different needs for a meal plan. Your health care provider may recommend that you work with a diet and nutrition specialist (dietitian) to make a meal plan that is best for you. Your meal plan may vary depending on factors such as:  The calories you need.  The medicines you take.  Your weight.  Your blood glucose, blood pressure, and cholesterol levels.  Your activity level.  Other health conditions you have, such as heart or kidney disease.  How do carbohydrates affect me? Carbohydrates affect your blood glucose level more than any other type of food. Eating carbohydrates naturally increases the amount of glucose in your blood. Carbohydrate counting is a method for keeping track of how many carbohydrates you eat. Counting carbohydrates is important to keep your blood glucose at a healthy level, especially if you use insulin or take certain oral diabetes medicines. It is important to know how many carbohydrates you can safely have in each meal. This is different for every person. Your dietitian can help you calculate how many carbohydrates you should have at each meal and for snack. Foods that contain carbohydrates include:  Bread, cereal, rice, pasta, and crackers.  Potatoes and corn.  Peas, beans, and lentils.  Milk and yogurt.  Fruit and juice.  Desserts, such as cakes, cookies, ice cream, and candy.  How does alcohol affect me? Alcohol can cause a sudden decrease in blood  glucose (hypoglycemia), especially if you use insulin or take certain oral diabetes medicines. Hypoglycemia can be a life-threatening condition. Symptoms of hypoglycemia (sleepiness, dizziness, and confusion) are similar to symptoms of having too much alcohol. If your health care provider says that alcohol is safe for you, follow these guidelines:  Limit alcohol intake to no more than 1 drink per day for nonpregnant women and 2 drinks per day for men. One drink equals 12 oz of beer, 5 oz of wine, or 1 oz of hard liquor.  Do not drink on an empty stomach.  Keep yourself hydrated with water, diet soda, or unsweetened iced tea.  Keep in mind that regular soda, juice, and other mixers may contain a lot of sugar and must be counted as carbohydrates.  What are tips for following this plan? Reading food labels  Start by checking the serving size on the label. The amount of calories, carbohydrates, fats, and other nutrients listed on the label are based on one serving of the food. Many foods contain more than one serving per package.  Check the total grams (g) of carbohydrates in one serving. You can calculate the number of servings of carbohydrates in one serving by dividing the total carbohydrates by 15. For example, if a food has 30 g of total carbohydrates, it would be equal to 2 servings of carbohydrates.  Check the number of grams (g) of saturated and trans fats in one serving. Choose foods that have low or no amount of these fats.  Check the number of milligrams (mg) of sodium in one serving. Most people   should limit total sodium intake to less than 2,300 mg per day.  Always check the nutrition information of foods labeled as "low-fat" or "nonfat". These foods may be higher in added sugar or refined carbohydrates and should be avoided.  Talk to your dietitian to identify your daily goals for nutrients listed on the label. Shopping  Avoid buying canned, premade, or processed foods. These  foods tend to be high in fat, sodium, and added sugar.  Shop around the outside edge of the grocery store. This includes fresh fruits and vegetables, bulk grains, fresh meats, and fresh dairy. Cooking  Use low-heat cooking methods, such as baking, instead of high-heat cooking methods like deep frying.  Cook using healthy oils, such as olive, canola, or sunflower oil.  Avoid cooking with butter, cream, or high-fat meats. Meal planning  Eat meals and snacks regularly, preferably at the same times every day. Avoid going long periods of time without eating.  Eat foods high in fiber, such as fresh fruits, vegetables, beans, and whole grains. Talk to your dietitian about how many servings of carbohydrates you can eat at each meal.  Eat 4-6 ounces of lean protein each day, such as lean meat, chicken, fish, eggs, or tofu. 1 ounce is equal to 1 ounce of meat, chicken, or fish, 1 egg, or 1/4 cup of tofu.  Eat some foods each day that contain healthy fats, such as avocado, nuts, seeds, and fish. Lifestyle   Check your blood glucose regularly.  Exercise at least 30 minutes 5 or more days each week, or as told by your health care provider.  Take medicines as told by your health care provider.  Do not use any products that contain nicotine or tobacco, such as cigarettes and e-cigarettes. If you need help quitting, ask your health care provider.  Work with a counselor or diabetes educator to identify strategies to manage stress and any emotional and social challenges. What are some questions to ask my health care provider?  Do I need to meet with a diabetes educator?  Do I need to meet with a dietitian?  What number can I call if I have questions?  When are the best times to check my blood glucose? Where to find more information:  American Diabetes Association: diabetes.org/food-and-fitness/food  Academy of Nutrition and Dietetics:  www.eatright.org/resources/health/diseases-and-conditions/diabetes  National Institute of Diabetes and Digestive and Kidney Diseases (NIH): www.niddk.nih.gov/health-information/diabetes/overview/diet-eating-physical-activity Summary  A healthy meal plan will help you control your blood glucose and maintain a healthy lifestyle.  Working with a diet and nutrition specialist (dietitian) can help you make a meal plan that is best for you.  Keep in mind that carbohydrates and alcohol have immediate effects on your blood glucose levels. It is important to count carbohydrates and to use alcohol carefully. This information is not intended to replace advice given to you by your health care provider. Make sure you discuss any questions you have with your health care provider. Document Released: 04/10/2005 Document Revised: 08/18/2016 Document Reviewed: 08/18/2016 Elsevier Interactive Patient Education  2018 [employer]  

## 2017-10-19 NOTE — Progress Notes (Signed)
Patient: [name] Rorke Sr. Male    DOB: 11/19/1939   77 y.o.   MRN: [id number] Visit Date: 10/19/2017  Today's Provider: Mar Daring, [name]   Chief Complaint  Patient presents with  . Follow-up    T2DM and Iron   Subjective:   Patient is here for 3 month follow-up iron and T2DM. HPI  Diabetes Mellitus Type II, Follow-up:   Lab Results  Component Value Date   HGBA1C 7.4 10/19/2017   HGBA1C 8.0 (H) 08/17/2017   HGBA1C 8.2 (H) 07/20/2017    Last seen for diabetes 3 months ago.  Management since then includes none. He reports excellent compliance with treatment. He is not having side effects.  Current symptoms include polydipsia and have been stable. Home blood sugar records: 100's-120's in the AM Reports this morning it was 85.  Episodes of hypoglycemia? no   Current Insulin Regimen: none Most Recent Eye Exam: need to schedule appointment Weight trend: stable Prior visit with dietician: no Current diet: in general, an "unhealthy" diet   Pertinent Labs:    Component Value Date/Time   CHOL 99 (L) 03/19/2017 1014   TRIG 120 03/19/2017 1014   TRIG 136 02/28/2008   HDL 35 (L) 03/19/2017 1014   LDLCALC 40 03/19/2017 1014   CREATININE 1.10 09/14/2017 1009   CREATININE 1.43 (H) 06/12/2017 1146    Wt Readings from Last 3 Encounters:  10/19/17 178 lb (80.7 kg)  09/29/17 176 lb (79.8 kg)  09/14/17 179 lb 6.4 oz (81.4 kg)   ------------------------------------------------------------------------     Allergies  Allergen Reactions  . Ramipril      Current Outpatient Medications:  .  ACCU-CHEK AVIVA PLUS test strip, USE AS DIRECTED TO CHECK FASTING BLOOD SUGAR EVERY MORNING, Disp: 100 each, Rfl: 4 .  allopurinol (ZYLOPRIM) 300 MG tablet, TAKE 1 TABLET BY MOUTH ONCE DAILY, Disp: 90 tablet, Rfl: 1 .  amLODipine-atorvastatin (CADUET) 5-20 MG tablet, TAKE 1 TABLET BY MOUTH ONCE A DAY, Disp: 90 tablet, Rfl: 1 .  aspirin 81 MG chewable tablet,  Chew 81 mg by mouth daily., Disp: , Rfl:  .  atorvastatin (LIPITOR) 20 MG tablet, Take 1 tablet (20 mg total) by mouth daily., Disp: 90 tablet, Rfl: 3 .  Blood Glucose Monitoring Suppl (ACCU-CHEK AVIVA PLUS) w/Device KIT, To check blood sugar once daily, Disp: 1 kit, Rfl: 0 .  cyanocobalamin (,VITAMIN B-12,) 1000 MCG/ML injection, Inject 1042mg daily for 7 days, then Inject 10093m once per week for 8 weeks, then inject 100039mmonthly, Disp: 10 mL, Rfl: 3 .  digoxin (LANOXIN) 0.125 MG tablet, TAKE 1 TABLET BY MOUTH ONCE DAILY, Disp: 90 tablet, Rfl: 1 .  ferrous sulfate ([employer]S IRON) 325 (65 FE) MG tablet, Take 1 tablet (325 mg total) by mouth daily with breakfast., Disp: 30 tablet, Rfl: 3 .  furosemide (LASIX) 20 MG tablet, TAKE 1 TABLET BY MOUTH ONCE DAILY, Disp: 90 tablet, Rfl: 1 .  gabapentin (NEURONTIN) 400 MG capsule, Take 1 capsule (400 mg total) by mouth 3 (three) times daily., Disp: 90 capsule, Rfl: 0 .  glipiZIDE (GLUCOTROL XL) 10 MG 24 hr tablet, TAKE 1 TABLET BY MOUTH ONCE A DAY WITH BREAKFAST, Disp: 90 tablet, Rfl: 3 .  lisinopril (PRINIVIL,ZESTRIL) 20 MG tablet, TAKE 1 TABLET BY MOUTH ONCE DAILY, Disp: 90 tablet, Rfl: 3 .  meloxicam (MOBIC) 7.5 MG tablet, TAKE 1 TABLET BY MOUTH TWICE (2) DAILY, Disp: 60 tablet, Rfl: 5 .  metFORMIN (GLUCOPHAGE)  1000 MG tablet, TAKE 1 TABLET BY MOUTH TWICE A DAY WITH A MEAL, Disp: 180 tablet, Rfl: 1 .  nitroGLYCERIN (NITROSTAT) 0.4 MG SL tablet, Place 1 tablet (0.4 mg total) under the tongue every 5 (five) minutes as needed for chest pain., Disp: 25 tablet, Rfl: 6 .  ONGLYZA 5 MG TABS tablet, TAKE 1 TABLET BY MOUTH ONCE DAILY, Disp: 90 tablet, Rfl: 1 .  potassium chloride (K-DUR) 10 MEQ tablet, TAKE 1 TABLET BY MOUTH ONCE DAILY, Disp: 90 tablet, Rfl: 1 .  SYRINGE-NEEDLE, DISP, 3 ML (B-D SYRINGE/NEEDLE 3CC/25GX5/8) 25G X 5/8" 3 ML MISC, For use with cyanocobalamin injections, Disp: 50 each, Rfl: 0 .  tamsulosin (FLOMAX) 0.4 MG CAPS capsule, Take 1 capsule  (0.4 mg total) by mouth daily., Disp: 30 capsule, Rfl: 5  Review of Systems  Constitutional: Negative.   Eyes: Negative for visual disturbance.  Cardiovascular: Negative for chest pain, palpitations and leg swelling.  Endocrine: Positive for polydipsia.  Musculoskeletal: Positive for back pain and gait problem.  Neurological: Positive for numbness. Negative for dizziness, light-headedness and headaches.    Social History   Tobacco Use  . Smoking status: Former Smoker    Packs/day: 1.00    Years: 4.00    Pack years: 4.00    Types: Cigarettes  . Smokeless tobacco: Current User    Types: Chew  Substance Use Topics  . Alcohol use: No    Alcohol/week: 0.5 oz    Types: 1 Standard drinks or equivalent per week   Objective:   BP (!) 150/70 (BP Location: Left Arm, Patient Position: Sitting, Cuff Size: Normal)   Pulse 66   Temp 98.5 F (36.9 C) (Oral)   Resp 16   Wt 178 lb (80.7 kg)   BMI 27.06 kg/m    Physical Exam  Constitutional: He appears well-developed and well-nourished. No distress.  HENT:  Head: Normocephalic and atraumatic.  Neck: Normal range of motion. Neck supple. No tracheal deviation present. No thyromegaly present.  Cardiovascular: Normal rate, regular rhythm and normal heart sounds. Exam reveals no gallop and no friction rub.  No murmur heard. Pulmonary/Chest: Effort normal and breath sounds normal. No respiratory distress. He has no wheezes. He has no rales.  Musculoskeletal: He exhibits no edema.  Lymphadenopathy:    He has no cervical adenopathy.  Skin: He is not diaphoretic.  Vitals reviewed.      Assessment & Plan:     1. Type 2 diabetes mellitus with complication, without long-term current use of insulin (HCC) A1c improved to 7.4. Continue current treatment plan. I will see him back in 3 months for recheck.  - POCT glycosylated hemoglobin (Hb A1C)  2. Other dietary vitamin B12 deficiency anemia Reports needing refills. Previously started by Dr.  Marius Ditch with anemia. Refilled as below. I will recheck his hemoglobin, iron and B12 when he returns as well.  - cyanocobalamin (,VITAMIN B-12,) 1000 MCG/ML injection; Inject 1063mg once per week for 4 weeks, then inject 10061m monthly  Dispense: 10 mL; Refill: 3 - SYRINGE-NEEDLE, DISP, 3 ML (B-D SYRINGE/NEEDLE 3CC/25GX5/8) 25G X 5/8" 3 ML MISC; For use with cyanocobalamin injections  Dispense: 50 each; Refill: 0 - BD SHARPS COSatsopDispose of syringes after B12 injections  Dispense: 1 each; Refill: 5  3. Neuropathy Slight improvement with increased gabapentin to 40063mID. Will continue this current dose. He has appt with back doctor in early June and feels he is ok to wait until then. He will consider ESI at  that time prior to his planned trip to [place] in July.        Mar Daring, [name]  [facility]

## 2017-10-28 ENCOUNTER — Other Ambulatory Visit: Payer: Self-pay | Admitting: Physician Assistant

## 2017-10-28 DIAGNOSIS — G629 Polyneuropathy, unspecified: Secondary | ICD-10-CM

## 2017-10-28 DIAGNOSIS — M1A9XX0 Chronic gout, unspecified, without tophus (tophi): Secondary | ICD-10-CM

## 2017-11-09 ENCOUNTER — Other Ambulatory Visit: Payer: Self-pay | Admitting: Physician Assistant

## 2017-11-09 DIAGNOSIS — E119 Type 2 diabetes mellitus without complications: Secondary | ICD-10-CM

## 2017-11-24 ENCOUNTER — Other Ambulatory Visit: Payer: Self-pay | Admitting: Physician Assistant

## 2017-11-24 DIAGNOSIS — E119 Type 2 diabetes mellitus without complications: Secondary | ICD-10-CM

## 2018-01-19 ENCOUNTER — Encounter: Payer: Self-pay | Admitting: Physician Assistant

## 2018-01-19 ENCOUNTER — Ambulatory Visit: Payer: Medicare Other | Admitting: Physician Assistant

## 2018-01-19 VITALS — BP 130/60 | HR 69 | Temp 98.4°F | Resp 16 | Wt 173.6 lb

## 2018-01-19 DIAGNOSIS — D513 Other dietary vitamin B12 deficiency anemia: Secondary | ICD-10-CM | POA: Diagnosis not present

## 2018-01-19 DIAGNOSIS — G8929 Other chronic pain: Secondary | ICD-10-CM

## 2018-01-19 DIAGNOSIS — D508 Other iron deficiency anemias: Secondary | ICD-10-CM | POA: Diagnosis not present

## 2018-01-19 DIAGNOSIS — M170 Bilateral primary osteoarthritis of knee: Secondary | ICD-10-CM

## 2018-01-19 DIAGNOSIS — M5442 Lumbago with sciatica, left side: Secondary | ICD-10-CM | POA: Diagnosis not present

## 2018-01-19 DIAGNOSIS — M5441 Lumbago with sciatica, right side: Secondary | ICD-10-CM

## 2018-01-19 DIAGNOSIS — E118 Type 2 diabetes mellitus with unspecified complications: Secondary | ICD-10-CM | POA: Diagnosis not present

## 2018-01-19 LAB — POCT GLYCOSYLATED HEMOGLOBIN (HGB A1C): HEMOGLOBIN A1C: 8.4 % — AB (ref 4.0–5.6)

## 2018-01-19 NOTE — Progress Notes (Signed)
Patient: [name] Guiney Sr. Male    DOB: 11/19/1939   77 y.o.   MRN: [id number] Visit Date: 01/19/2018  Today's Provider: Mar Daring, [name]   Chief Complaint  Patient presents with  . Follow-up    T2DM   Subjective:    HPI  Diabetes Mellitus Type II, Follow-up:   Lab Results  Component Value Date   HGBA1C 8.4 (A) 01/19/2018   HGBA1C 7.4 10/19/2017   HGBA1C 8.0 (H) 08/17/2017    Last seen for diabetes 3 months ago.  Management since then includes none. A1C improved to 7.4.continue treatment plan. He reports excellent compliance with treatment. He is not having side effects.  Current symptoms include none and have been stable. Home blood sugar records: 90's-130's  Episodes of hypoglycemia? no   Current Insulin Regimen:  Weight trend: stable Current diet: in general, an "unhealthy" diet   Pertinent Labs:    Component Value Date/Time   CHOL 99 (L) 03/19/2017 1014   TRIG 120 03/19/2017 1014   TRIG 136 02/28/2008   HDL 35 (L) 03/19/2017 1014   LDLCALC 40 03/19/2017 1014   CREATININE 1.10 09/14/2017 1009   CREATININE 1.43 (H) 06/12/2017 1146    Wt Readings from Last 3 Encounters:  01/19/18 173 lb 9.6 oz (78.7 kg)  10/19/17 178 lb (80.7 kg)  09/29/17 176 lb (79.8 kg)   ------------------------------------------------------------------------     Allergies  Allergen Reactions  . Ramipril      Current Outpatient Medications:  .  ACCU-CHEK AVIVA PLUS test strip, USE AS DIRECTED TO CHECK FASTING BLOOD SUGAR EVERY MORNING, Disp: 100 each, Rfl: 4 .  allopurinol (ZYLOPRIM) 300 MG tablet, TAKE 1 TABLET BY MOUTH ONCE A DAY, Disp: 90 tablet, Rfl: 1 .  amLODipine-atorvastatin (CADUET) 5-20 MG tablet, TAKE 1 TABLET BY MOUTH ONCE A DAY, Disp: 90 tablet, Rfl: 1 .  aspirin 81 MG chewable tablet, Chew 81 mg by mouth daily., Disp: , Rfl:  .  atorvastatin (LIPITOR) 20 MG tablet, Take 1 tablet (20 mg total) by mouth daily., Disp: 90 tablet, Rfl: 3 .  BD  SHARPS CONTAINER HOME MISC, Dispose of syringes after B12 injections, Disp: 1 each, Rfl: 5 .  Blood Glucose Monitoring Suppl (ACCU-CHEK AVIVA PLUS) w/Device KIT, To check blood sugar once daily, Disp: 1 kit, Rfl: 0 .  cyanocobalamin (,VITAMIN B-12,) 1000 MCG/ML injection, Inject 1070mg once per week for 4 weeks, then inject 10060m monthly, Disp: 10 mL, Rfl: 3 .  digoxin (LANOXIN) 0.125 MG tablet, TAKE 1 TABLET BY MOUTH ONCE DAILY, Disp: 90 tablet, Rfl: 1 .  ferrous sulfate (CVS IRON) 325 (65 FE) MG tablet, Take 1 tablet (325 mg total) by mouth daily with breakfast., Disp: 30 tablet, Rfl: 3 .  furosemide (LASIX) 20 MG tablet, TAKE 1 TABLET BY MOUTH ONCE DAILY, Disp: 90 tablet, Rfl: 1 .  gabapentin (NEURONTIN) 400 MG capsule, TAKE 1 CAPSULE BY MOUTH 3 TIMES A DAY, Disp: 90 capsule, Rfl: 5 .  glipiZIDE (GLUCOTROL XL) 10 MG 24 hr tablet, TAKE 1 TABLET BY MOUTH ONCE A DAY WITH BREAKFAST, Disp: 90 tablet, Rfl: 3 .  lisinopril (PRINIVIL,ZESTRIL) 20 MG tablet, TAKE 1 TABLET BY MOUTH ONCE DAILY, Disp: 90 tablet, Rfl: 3 .  meloxicam (MOBIC) 7.5 MG tablet, TAKE 1 TABLET BY MOUTH TWICE (2) DAILY, Disp: 60 tablet, Rfl: 5 .  metFORMIN (GLUCOPHAGE) 1000 MG tablet, TAKE 1 TABLET BY MOUTH TWICE A DAY WITH A MEAL, Disp: 180 tablet, Rfl: 1 .  nitroGLYCERIN (NITROSTAT) 0.4 MG SL tablet, Place 1 tablet (0.4 mg total) under the tongue every 5 (five) minutes as needed for chest pain., Disp: 25 tablet, Rfl: 6 .  ONGLYZA 5 MG TABS tablet, TAKE 1 TABLET BY MOUTH ONCE DAILY, Disp: 90 tablet, Rfl: 1 .  potassium chloride (K-DUR) 10 MEQ tablet, TAKE 1 TABLET BY MOUTH ONCE DAILY, Disp: 90 tablet, Rfl: 1 .  SYRINGE-NEEDLE, DISP, 3 ML (B-D SYRINGE/NEEDLE 3CC/25GX5/8) 25G X 5/8" 3 ML MISC, For use with cyanocobalamin injections, Disp: 50 each, Rfl: 0 .  tamsulosin (FLOMAX) 0.4 MG CAPS capsule, Take 1 capsule (0.4 mg total) by mouth daily., Disp: 30 capsule, Rfl: 5  Review of Systems  Constitutional: Negative.   Respiratory:  Negative.   Cardiovascular: Negative.   Gastrointestinal: Negative.   Musculoskeletal: Positive for back pain.  Neurological: Positive for numbness.  Hematological: Negative.     Social History   Tobacco Use  . Smoking status: Former Smoker    Packs/day: 1.00    Years: 4.00    Pack years: 4.00    Types: Cigarettes  . Smokeless tobacco: Current User    Types: Chew  Substance Use Topics  . Alcohol use: No    Alcohol/week: 0.6 oz    Types: 1 Standard drinks or equivalent per week   Objective:   BP 130/60 (BP Location: Left Arm, Patient Position: Sitting, Cuff Size: Normal)   Pulse 69   Temp 98.4 F (36.9 C) (Oral)   Resp 16   Wt 173 lb 9.6 oz (78.7 kg)   BMI 26.40 kg/m    Physical Exam  Constitutional: He appears well-developed and well-nourished. No distress.  HENT:  Head: Normocephalic and atraumatic.  Neck: Normal range of motion. Neck supple.  Cardiovascular: Normal rate, regular rhythm and normal heart sounds. Exam reveals no gallop and no friction rub.  No murmur heard. Pulmonary/Chest: Effort normal and breath sounds normal. No respiratory distress. He has no wheezes. He has no rales.  Musculoskeletal: He exhibits no edema.  Skin: He is not diaphoretic.  Vitals reviewed.       Assessment & Plan:     1. Type 2 diabetes mellitus with complication, without long-term current use of insulin (HCC) A1c up to 8.4. Has increased bread per patient (eating more tomato sandwiches). Discussed cutting back by only eating one slice instead of two. Continue meds as prescribed. I will see him back in 3 months for AWV.  - POCT glycosylated hemoglobin (Hb A1C)  2. Other dietary vitamin B12 deficiency anemia Will check labs as below and f/u pending results. - B12 - CBC with Differential  3. Iron deficiency anemia secondary to inadequate dietary iron intake Will check labs as below and f/u pending results. - Iron, TIBC and Ferritin Panel - CBC with Differential  4.  Chronic bilateral low back pain with bilateral sciatica Worsening. Followed by orthopedics in [place]. Rx was given to him by his orthopedic physician for braces that will help to lessen his progression since he stills remains very active. Forms were completed and he was measured today in the office.   5. Primary osteoarthritis of both knees See above medical treatment plan.       Mar Daring, [name]  [facility]

## 2018-01-20 ENCOUNTER — Telehealth: Payer: Self-pay

## 2018-01-20 DIAGNOSIS — M170 Bilateral primary osteoarthritis of knee: Secondary | ICD-10-CM | POA: Insufficient documentation

## 2018-01-20 LAB — CBC WITH DIFFERENTIAL/PLATELET
BASOS ABS: 0.1 10*3/uL (ref 0.0–0.2)
Basos: 1 %
EOS (ABSOLUTE): 0.2 10*3/uL (ref 0.0–0.4)
Eos: 3 %
HEMATOCRIT: 38.6 % (ref 37.5–51.0)
Hemoglobin: 12.8 g/dL — ABNORMAL LOW (ref 13.0–17.7)
IMMATURE GRANULOCYTES: 0 %
Immature Grans (Abs): 0 10*3/uL (ref 0.0–0.1)
LYMPHS ABS: 1.8 10*3/uL (ref 0.7–3.1)
Lymphs: 24 %
MCH: 29.8 pg (ref 26.6–33.0)
MCHC: 33.2 g/dL (ref 31.5–35.7)
MCV: 90 fL (ref 79–97)
MONOS ABS: 0.6 10*3/uL (ref 0.1–0.9)
Monocytes: 8 %
NEUTROS PCT: 64 %
Neutrophils Absolute: 4.9 10*3/uL (ref 1.4–7.0)
PLATELETS: 240 10*3/uL (ref 150–450)
RBC: 4.3 x10E6/uL (ref 4.14–5.80)
RDW: 13.9 % (ref 12.3–15.4)
WBC: 7.6 10*3/uL (ref 3.4–10.8)

## 2018-01-20 LAB — IRON,TIBC AND FERRITIN PANEL
Ferritin: 60 ng/mL (ref 30–400)
IRON SATURATION: 39 % (ref 15–55)
IRON: 104 ug/dL (ref 38–169)
Total Iron Binding Capacity: 270 ug/dL (ref 250–450)
UIBC: 166 ug/dL (ref 111–343)

## 2018-01-20 LAB — VITAMIN B12: VITAMIN B 12: 535 pg/mL (ref 232–1245)

## 2018-01-20 NOTE — Telephone Encounter (Signed)
Patient advised as below. Patient verbalizes understanding and is in agreement with treatment plan.  

## 2018-01-20 NOTE — Telephone Encounter (Signed)
-----   Message from Margaretann LovelessJennifer M Burnette, [place]PA-C sent at 01/20/2018 10:07 AM EDT ----- Iron is normal now. B12 is improved. Continue B12, may stop ferrous sulfate. HgB holding steady. Will recheck in 3-6 months.

## 2018-02-03 ENCOUNTER — Other Ambulatory Visit: Payer: Self-pay | Admitting: Physician Assistant

## 2018-02-03 DIAGNOSIS — N401 Enlarged prostate with lower urinary tract symptoms: Secondary | ICD-10-CM

## 2018-02-03 DIAGNOSIS — R3911 Hesitancy of micturition: Principal | ICD-10-CM

## 2018-02-03 DIAGNOSIS — M1A9XX0 Chronic gout, unspecified, without tophus (tophi): Secondary | ICD-10-CM

## 2018-02-11 ENCOUNTER — Other Ambulatory Visit: Payer: Self-pay | Admitting: Physician Assistant

## 2018-02-11 DIAGNOSIS — E7800 Pure hypercholesterolemia, unspecified: Secondary | ICD-10-CM

## 2018-02-11 DIAGNOSIS — I10 Essential (primary) hypertension: Secondary | ICD-10-CM

## 2018-03-09 ENCOUNTER — Other Ambulatory Visit: Payer: Self-pay | Admitting: Physician Assistant

## 2018-03-09 DIAGNOSIS — R600 Localized edema: Secondary | ICD-10-CM

## 2018-03-09 DIAGNOSIS — I10 Essential (primary) hypertension: Secondary | ICD-10-CM

## 2018-03-18 ENCOUNTER — Ambulatory Visit (INDEPENDENT_AMBULATORY_CARE_PROVIDER_SITE_OTHER): Payer: Medicare Other

## 2018-03-18 VITALS — BP 108/42 | HR 67 | Temp 98.2°F | Ht 68.0 in | Wt 180.2 lb

## 2018-03-18 DIAGNOSIS — Z0000 Encounter for general adult medical examination without abnormal findings: Secondary | ICD-10-CM

## 2018-03-18 NOTE — Patient Instructions (Addendum)
[name] , Thank you for taking time to come for your Medicare Wellness Visit. I appreciate your ongoing commitment to your health goals. Please review the following plan we discussed and let me know if I can assist you in the future.   Screening recommendations/referrals: Colonoscopy: N/A Recommended yearly ophthalmology/optometry visit for glaucoma screening and checkup Recommended yearly dental visit for hygiene and checkup  Vaccinations: Influenza vaccine: Up to date Pneumococcal vaccine: Up to date Tdap vaccine: Pt declines today.  Shingles vaccine: Pt declines today.     Advanced directives: Please bring a copy of your POA (Power of Attorney) and/or Living Will to your next appointment.   Conditions/risks identified: Recommend to continue decreasing amount of tobacco chewed then completely quitting.   Next appointment: 04/21/18 @ 8 AM with Joycelyn ManJennifer Burnette. Pt declined scheduling an AWV for 2020.   Preventive Care 165 Years and Older, Male Preventive care refers to lifestyle choices and visits with your health care provider that can promote health and wellness. What does preventive care include?  A yearly physical exam. This is also called an annual well check.  Dental exams once or twice a year.  Routine eye exams. Ask your health care provider how often you should have your eyes checked.  Personal lifestyle choices, including:  Daily care of your teeth and gums.  Regular physical activity.  Eating a healthy diet.  Avoiding tobacco and drug use.  Limiting alcohol use.  Practicing safe sex.  Taking low doses of aspirin every day.  Taking vitamin and mineral supplements as recommended by your health care provider. What happens during an annual well check? The services and screenings done by your health care provider during your annual well check will depend on your age, overall health, lifestyle risk factors, and family history of disease. Counseling  Your  health care provider may ask you questions about your:  Alcohol use.  Tobacco use.  Drug use.  Emotional well-being.  Home and relationship well-being.  Sexual activity.  Eating habits.  History of falls.  Memory and ability to understand (cognition).  Work and work Astronomerenvironment. Screening  You may have the following tests or measurements:  Height, weight, and BMI.  Blood pressure.  Lipid and cholesterol levels. These may be checked every 5 years, or more frequently if you are over 717 years old.  Skin check.  Lung cancer screening. You may have this screening every year starting at age 76 if you have a 30-pack-year history of smoking and currently smoke or have quit within the past 15 years.  Fecal occult blood test (FOBT) of the stool. You may have this test every year starting at age 76.  Flexible sigmoidoscopy or colonoscopy. You may have a sigmoidoscopy every 5 years or a colonoscopy every 10 years starting at age 76.  Prostate cancer screening. Recommendations will vary depending on your family history and other risks.  Hepatitis C blood test.  Hepatitis B blood test.  Sexually transmitted disease (STD) testing.  Diabetes screening. This is done by checking your blood sugar (glucose) after you have not eaten for a while (fasting). You may have this done every 1-3 years.  Abdominal aortic aneurysm (AAA) screening. You may need this if you are a current or former smoker.  Osteoporosis. You may be screened starting at age 76 if you are at high risk. Talk with your health care provider about your test results, treatment options, and if necessary, the need for more tests. Vaccines  Your health  care provider may recommend certain vaccines, such as:  Influenza vaccine. This is recommended every year.  Tetanus, diphtheria, and acellular pertussis (Tdap, Td) vaccine. You may need a Td booster every 10 years.  Zoster vaccine. You may need this after age  81.  Pneumococcal 13-valent conjugate (PCV13) vaccine. One dose is recommended after age 1.  Pneumococcal polysaccharide (PPSV23) vaccine. One dose is recommended after age 45. Talk to your health care provider about which screenings and vaccines you need and how often you need them. This information is not intended to replace advice given to you by your health care provider. Make sure you discuss any questions you have with your health care provider. Document Released: 08/10/2015 Document Revised: 04/02/2016 Document Reviewed: 05/15/2015 Elsevier Interactive Patient Education  2017 [place] Prevention in the Home Falls can cause injuries. They can happen to people of all ages. There are many things you can do to make your home safe and to help prevent falls. What can I do on the outside of my home?  Regularly fix the edges of walkways and driveways and fix any cracks.  Remove anything that might make you trip as you walk through a door, such as a raised step or threshold.  Trim any bushes or trees on the path to your home.  Use bright outdoor lighting.  Clear any walking paths of anything that might make someone trip, such as rocks or tools.  Regularly check to see if handrails are loose or broken. Make sure that both sides of any steps have handrails.  Any raised decks and porches should have guardrails on the edges.  Have any leaves, snow, or ice cleared regularly.  Use sand or salt on walking paths during winter.  Clean up any spills in your garage right away. This includes oil or grease spills. What can I do in the bathroom?  Use night lights.  Install grab bars by the toilet and in the tub and shower. Do not use towel bars as grab bars.  Use non-skid mats or decals in the tub or shower.  If you need to sit down in the shower, use a plastic, non-slip stool.  Keep the floor dry. Clean up any water that spills on the floor as soon as it happens.  Remove  soap buildup in the tub or shower regularly.  Attach bath mats securely with double-sided non-slip rug tape.  Do not have throw rugs and other things on the floor that can make you trip. What can I do in the bedroom?  Use night lights.  Make sure that you have a light by your bed that is easy to reach.  Do not use any sheets or blankets that are too big for your bed. They should not hang down onto the floor.  Have a firm chair that has side arms. You can use this for support while you get dressed.  Do not have throw rugs and other things on the floor that can make you trip. What can I do in the kitchen?  Clean up any spills right away.  Avoid walking on wet floors.  Keep items that you use a lot in easy-to-reach places.  If you need to reach something above you, use a strong step stool that has a grab bar.  Keep electrical cords out of the way.  Do not use floor polish or wax that makes floors slippery. If you must use wax, use non-skid floor wax.  Do not have  throw rugs and other things on the floor that can make you trip. What can I do with my stairs?  Do not leave any items on the stairs.  Make sure that there are handrails on both sides of the stairs and use them. Fix handrails that are broken or loose. Make sure that handrails are as long as the stairways.  Check any carpeting to make sure that it is firmly attached to the stairs. Fix any carpet that is loose or worn.  Avoid having throw rugs at the top or bottom of the stairs. If you do have throw rugs, attach them to the floor with carpet tape.  Make sure that you have a light switch at the top of the stairs and the bottom of the stairs. If you do not have them, ask someone to add them for you. What else can I do to help prevent falls?  Wear shoes that:  Do not have high heels.  Have rubber bottoms.  Are comfortable and fit you well.  Are closed at the toe. Do not wear sandals.  If you use a  stepladder:  Make sure that it is fully opened. Do not climb a closed stepladder.  Make sure that both sides of the stepladder are locked into place.  Ask someone to hold it for you, if possible.  Clearly mark and make sure that you can see:  Any grab bars or handrails.  First and last steps.  Where the edge of each step is.  Use tools that help you move around (mobility aids) if they are needed. These include:  Canes.  Walkers.  Scooters.  Crutches.  Turn on the lights when you go into a dark area. Replace any light bulbs as soon as they burn out.  Set up your furniture so you have a clear path. Avoid moving your furniture around.  If any of your floors are uneven, fix them.  If there are any pets around you, be aware of where they are.  Review your medicines with your doctor. Some medicines can make you feel dizzy. This can increase your chance of falling. Ask your doctor what other things that you can do to help prevent falls. This information is not intended to replace advice given to you by your health care provider. Make sure you discuss any questions you have with your health care provider. Document Released: 05/10/2009 Document Revised: 12/20/2015 Document Reviewed: 08/18/2014 Elsevier Interactive Patient Education  2017 [employer].

## 2018-03-18 NOTE — Progress Notes (Signed)
Subjective:   [name] Sr. is a 77 y.o. male who presents for Medicare Annual/Subsequent preventive examination.  Review of Systems:  N/A  Cardiac Risk Factors include: advanced age (>32mn, >>32women);diabetes mellitus;dyslipidemia;male gender;hypertension;smoking/ tobacco exposure     Objective:    Vitals: BP (!) 108/42 Comment: Pt declined any feelings of fatigue or dizzy.  Pulse 67   Temp 98.2 F (36.8 C) (Oral)   Ht _0  (1.727 m)   Wt 180 lb 3.2 oz (81.7 kg)   BMI 27.40 kg/m   Body mass index is 27.4 kg/m.  Advanced Directives 03/18/2018 03/17/2017 03/13/2016  Does Patient Have a Medical Advance Directive? Yes Yes Yes  Type of AParamedicof [place] will [place] will Living will  Copy of HG[place]in Chart? No - copy requested No - copy requested -    Tobacco Social History   Tobacco Use  Smoking Status Never Smoker  Smokeless Tobacco Current User  . Types: Chew  Tobacco Comment   chew tobacco - 1 pack last 3 weeks     Ready to quit: Not Answered Counseling given: Not Answered Comment: chew tobacco - 1 pack last 3 weeks   Clinical Intake:  Pre-visit preparation completed: Yes  Pain : No/denies pain Pain Score: 0-No pain     Nutritional Status: BMI 25 -29 Overweight Nutritional Risks: None Diabetes: Yes(type 2) CBG done?: No Did pt. bring in CBG monitor from home?: No  How often do you need to have someone help you when you read instructions, pamphlets, or other written materials from your doctor or pharmacy?: 1 - Never  Interpreter Needed?: No  Information [facility] :: [facility]  Past Medical History:  Diagnosis Date  . Arthritis   . Back pain    hx  . Colon polyps   . DM2 (diabetes mellitus, type 2) (HCC)    stable  . GERD (gastroesophageal reflux disease)   . Glaucoma   . Gout   . HLD (hyperlipidemia)   . HTN (hypertension)   .  Neuropathy   . Unstable angina (HCC)    resolved. a. negative myocardial infarction   Past Surgical History:  Procedure Laterality Date  . BACK SURGERY     Cyst removed  . CORONARY ARTERY BYPASS GRAFT  2004  . KNEE SURGERY     Family History  Family history unknown: Yes   Social History   Socioeconomic History  . Marital status: Widowed    Spouse name: Not on file  . Number of children: 3  . Years of education: Not on file  . Highest education level: 8th grade  Occupational History  . Not on file  Social Needs  . Financial resource strain: Not hard at all  . Food insecurity:    Worry: Never true    Inability: Never true  . Transportation needs:    Medical: No    Non-medical: No  Tobacco Use  . Smoking status: Never Smoker  . Smokeless tobacco: Current User    Types: Chew  . Tobacco comment: chew tobacco - 1 pack last 3 weeks  Substance and Sexual Activity  . Alcohol use: No    Alcohol/week: 0.0 standard drinks  . Drug use: No  . Sexual activity: Not on file  Lifestyle  . Physical activity:    Days per week: Not on file    Minutes per session: Not on file  . Stress: Not at  all  Relationships  . Social connections:    Talks on phone: Not on file    Gets together: Not on file    Attends religious service: Not on file    Active member of club or organization: Not on file    Attends meetings of clubs or organizations: Not on file    Relationship status: Widowed  Other Topics Concern  . Not on file  Social History Narrative  . Not on file    Outpatient Encounter Medications as of 03/18/2018  Medication Sig  . ACCU-CHEK AVIVA PLUS test strip USE AS DIRECTED TO CHECK FASTING BLOOD SUGAR EVERY MORNING  . allopurinol (ZYLOPRIM) 300 MG tablet TAKE 1 TABLET BY MOUTH ONCE A DAY  . aspirin 81 MG chewable tablet Chew 81 mg by mouth daily.  Marland Kitchen atorvastatin (LIPITOR) 20 MG tablet Take 1 tablet (20 mg total) by mouth daily.  . BD SHARPS CONTAINER HOME MISC Dispose of  syringes after B12 injections  . Blood Glucose Monitoring Suppl (ACCU-CHEK AVIVA PLUS) w/Device KIT To check blood sugar once daily  . digoxin (LANOXIN) 0.125 MG tablet TAKE 1 TABLET BY MOUTH ONCE DAILY  . furosemide (LASIX) 20 MG tablet TAKE 1 TABLET BY MOUTH ONCE DAILY  . gabapentin (NEURONTIN) 400 MG capsule TAKE 1 CAPSULE BY MOUTH 3 TIMES A DAY  . glipiZIDE (GLUCOTROL XL) 10 MG 24 hr tablet TAKE 1 TABLET BY MOUTH ONCE A DAY WITH BREAKFAST  . lisinopril (PRINIVIL,ZESTRIL) 20 MG tablet TAKE 1 TABLET BY MOUTH ONCE A DAY  . meloxicam (MOBIC) 7.5 MG tablet TAKE 1 TABLET BY MOUTH TWICE (2) DAILY  . metFORMIN (GLUCOPHAGE) 1000 MG tablet TAKE 1 TABLET BY MOUTH TWICE A DAY WITH A MEAL  . ONGLYZA 5 MG TABS tablet TAKE 1 TABLET BY MOUTH ONCE DAILY  . potassium chloride (K-DUR) 10 MEQ tablet TAKE 1 TABLET BY MOUTH ONCE DAILY  . SYRINGE-NEEDLE, DISP, 3 ML (B-D SYRINGE/NEEDLE 3CC/25GX5/8) 25G X 5/8" 3 ML MISC For use with cyanocobalamin injections  . tamsulosin (FLOMAX) 0.4 MG CAPS capsule TAKE 1 CAPSULE BY MOUTH ONCE DAILY  . amLODipine-atorvastatin (CADUET) 5-20 MG tablet TAKE 1 TABLET BY MOUTH ONCE DAILY (Patient not taking: Reported on 03/18/2018)  . cyanocobalamin (,VITAMIN B-12,) 1000 MCG/ML injection Inject 1017mg once per week for 4 weeks, then inject 10064m monthly (Patient not taking: Reported on 03/18/2018)  . ferrous sulfate ([employer] IRON) 325 (65 FE) MG tablet Take 1 tablet (325 mg total) by mouth daily with breakfast. (Patient not taking: Reported on 03/18/2018)  . nitroGLYCERIN (NITROSTAT) 0.4 MG SL tablet Place 1 tablet (0.4 mg total) under the tongue every 5 (five) minutes as needed for chest pain. (Patient not taking: Reported on 03/18/2018)   No facility-administered encounter medications on file as of 03/18/2018.     Activities of Daily Living In your present state of health, do you have any difficulty performing the following activities: 03/18/2018  Hearing? Y  Comment Occasionally,  declines hearing aid use.   Vision? N  Difficulty concentrating or making decisions? Y  Walking or climbing stairs? N  Dressing or bathing? N  Doing errands, shopping? N  Preparing Food and eating ? N  Using the Toilet? N  Managing your Medications? N  Managing your Finances? N  Housekeeping or managing your Housekeeping? N  Some recent data might be hidden    Patient Care Team: BuMar DaringPA-C as PCP - General (Family Medicine) GoMinna MerrittsMD as Consulting Physician (Cardiology)  Robert Bellow, MD (General Surgery)   Assessment:   This is a routine wellness examination for [facility].  Exercise Activities and Dietary recommendations Current Exercise Habits: The patient does not participate in regular exercise at present, Exercise limited by: Other - see comments(Pt states he stays busy with indoor and outdoor chores. Has 15 acres of land. )  Goals    . Increase water intake     Recommend increasing water intake to 6-8 glasses a day.     . Quit Chewing Tobacco     Recommend to continue decreasing amount of tobacco chewed then completely quitting.        Fall Risk Fall Risk  03/18/2018 03/17/2017 03/13/2016  Falls in the past year? No No No   Is the patient's home free of loose throw rugs in walkways, pet beds, electrical cords, etc?   yes      Grab bars in the bathroom? no      Handrails on the stairs?   no      Adequate lighting?   yes  Timed Get Up and Go Performed: N/A  Depression Screen PHQ 2/9 Scores 03/18/2018 03/17/2017 03/13/2016  PHQ - 2 Score 0 0 0    Cognitive Function     6CIT Screen 03/18/2018 03/17/2017  What Year? 0 points 0 points  What month? 0 points 0 points  What time? 0 points 0 points  Count back from 20 0 points 0 points  Months in reverse 4 points (No Data)  Repeat phrase 6 points 2 points  Total Score 10 -    Immunization History  Administered Date(s) Administered  . Influenza, High Dose Seasonal PF 03/17/2017  .  Influenza,inj,Quad PF,6+ Mos 05/05/2016  . Pneumococcal Conjugate-13 03/13/2016  . Pneumococcal Polysaccharide-23 03/17/2017    Qualifies for Shingles Vaccine? Due for Shingles vaccine. Declined my offer to administer today. Education has been provided regarding the importance of this vaccine. Pt has been advised to call her insurance company to determine her out of pocket expense. Advised she may also receive this vaccine at her local pharmacy or Health Dept. Verbalized acceptance and understanding.  Screening Tests Health Maintenance  Topic Date Due  . OPHTHALMOLOGY EXAM  11/20/1950  . TETANUS/TDAP  11/20/1959  . INFLUENZA VACCINE  02/25/2018  . FOOT EXAM  03/19/2018  . HEMOGLOBIN A1C  07/21/2018  . PNA vac Low Risk Adult  Completed   Cancer Screenings: Lung: Low Dose CT Chest recommended if Age 34-80 years, 30 pack-year currently smoking OR have quit w/in 15years. Patient does not qualify. Colorectal: N/A  Additional Screenings:  Hepatitis C Screening: N/A      Plan:  I have personally reviewed and addressed the Medicare Annual Wellness questionnaire and have noted the following in the patient's chart:  A. Medical and social history B. Use of alcohol, tobacco or illicit drugs  C. Current medications and supplements D. Functional ability and status E.  Nutritional status F.  Physical activity G. Advance directives H. List of other physicians I.  Hospitalizations, surgeries, and ER visits in previous 12 months J.  [place] such as hearing and vision if needed, cognitive and depression L. Referrals and appointments - none  In addition, I have reviewed and discussed with patient certain preventive protocols, quality metrics, and best practice recommendations. A written personalized care plan for preventive services as well as general preventive health recommendations were provided to patient.  See attached scanned questionnaire for additional information.    Signed,  Fabio Neighbors, LPN Nurse Health Advisor   Nurse Recommendations: Pt plans to set up an eye exam this year. Pt declined the tetanus vaccine today. Pt BP reading was low today but he declined feeling faint, fatigue or dizzy. Pt states he has been dealing with a chest cold. Scheduled f/u apt tomorrow, 03/19/18 @ 8:40 AM with PCP. Pt declined a sooner apt today.

## 2018-03-19 ENCOUNTER — Encounter: Payer: Self-pay | Admitting: Physician Assistant

## 2018-03-19 ENCOUNTER — Ambulatory Visit: Payer: Medicare Other | Admitting: Physician Assistant

## 2018-03-19 VITALS — BP 148/58 | HR 60 | Temp 98.5°F | Resp 16 | Wt 181.2 lb

## 2018-03-19 DIAGNOSIS — R05 Cough: Secondary | ICD-10-CM

## 2018-03-19 DIAGNOSIS — J069 Acute upper respiratory infection, unspecified: Secondary | ICD-10-CM | POA: Diagnosis not present

## 2018-03-19 DIAGNOSIS — R059 Cough, unspecified: Secondary | ICD-10-CM

## 2018-03-19 MED ORDER — BENZONATATE 200 MG PO CAPS: 200.0000 mg | ORAL_CAPSULE | Freq: Three times a day (TID) | ORAL | 0 refills | Status: DC | PRN

## 2018-03-19 MED ORDER — AMOXICILLIN-POT CLAVULANATE 875-125 MG PO TABS: 1.0000 | ORAL_TABLET | Freq: Two times a day (BID) | ORAL | 0 refills | Status: DC

## 2018-03-19 NOTE — Patient Instructions (Signed)
Upper Respiratory Infection, Adult Most upper respiratory infections (URIs) are caused by a virus. A URI affects the nose, throat, and upper air passages. The most common type of URI is often called "the common cold." Follow these instructions at home:  Take medicines only as told by your doctor.  Gargle warm saltwater or take cough drops to comfort your throat as told by your doctor.  Use a warm mist humidifier or inhale steam from a shower to increase air moisture. This may make it easier to breathe.  Drink enough fluid to keep your pee (urine) clear or pale yellow.  Eat soups and other clear broths.  Have a healthy diet.  Rest as needed.  Go back to work when your fever is gone or your doctor says it is okay. ? You may need to stay home longer to avoid giving your URI to others. ? You can also wear a face mask and wash your hands often to prevent spread of the virus.  Use your inhaler more if you have asthma.  Do not use any tobacco products, including cigarettes, chewing tobacco, or electronic cigarettes. If you need help quitting, ask your doctor. Contact a doctor if:  You are getting worse, not better.  Your symptoms are not helped by medicine.  You have chills.  You are getting more short of breath.  You have brown or red mucus.  You have yellow or brown discharge from your nose.  You have pain in your face, especially when you bend forward.  You have a fever.  You have puffy (swollen) neck glands.  You have pain while swallowing.  You have white areas in the back of your throat. Get help right away if:  You have very bad or constant: ? Headache. ? Ear pain. ? Pain in your forehead, behind your eyes, and over your cheekbones (sinus pain). ? Chest pain.  You have long-lasting (chronic) lung disease and any of the following: ? Wheezing. ? Long-lasting cough. ? Coughing up blood. ? A change in your usual mucus.  You have a stiff neck.  You have  changes in your: ? Vision. ? Hearing. ? Thinking. ? Mood. This information is not intended to replace advice given to you by your health care provider. Make sure you discuss any questions you have with your health care provider. Document Released: 12/31/2007 Document Revised: 03/16/2016 Document Reviewed: 10/19/2013 Elsevier Interactive Patient Education  2018 Elsevier Inc.  

## 2018-03-19 NOTE — Progress Notes (Signed)
Patient: [name] Scialdone Sr. Male    DOB: 11/19/1939   77 y.o.   MRN: [id number] Visit Date: 03/19/2018  Today's Provider: Mar Daring, [name]   Chief Complaint  Patient presents with  . URI   Subjective:    URI   This is a new problem. The problem has been gradually worsening. There has been no fever. Associated symptoms include congestion, coughing, headaches, sinus pain and sneezing. Pertinent negatives include no chest pain, ear pain, plugged ear sensation, rhinorrhea, sore throat or wheezing. Joint pain: "chest congestion" He has tried decongestant for the symptoms.   Reports that his blood pressure was running low yesterday when he saw the [facility] and was advised to come in today.  BP Readings from Last 3 Encounters:  03/19/18 (!) 148/58  03/18/18 (!) 108/42  01/19/18 130/60   Did travel to [place] 4 weeks ago, was there for 2 weeks, and then traveled to the beach once he returned for 1 week. Has been home x 1 week. Denies any worsening of leg swelling, no more than baseline and it always returns to normal over night.     Allergies  Allergen Reactions  . Ramipril      Current Outpatient Medications:  .  ACCU-CHEK AVIVA PLUS test strip, USE AS DIRECTED TO CHECK FASTING BLOOD SUGAR EVERY MORNING, Disp: 100 each, Rfl: 4 .  allopurinol (ZYLOPRIM) 300 MG tablet, TAKE 1 TABLET BY MOUTH ONCE A DAY, Disp: 90 tablet, Rfl: 1 .  aspirin 81 MG chewable tablet, Chew 81 mg by mouth daily., Disp: , Rfl:  .  atorvastatin (LIPITOR) 20 MG tablet, Take 1 tablet (20 mg total) by mouth daily., Disp: 90 tablet, Rfl: 3 .  BD SHARPS CONTAINER HOME MISC, Dispose of syringes after B12 injections, Disp: 1 each, Rfl: 5 .  Blood Glucose Monitoring Suppl (ACCU-CHEK AVIVA PLUS) w/Device KIT, To check blood sugar once daily, Disp: 1 kit, Rfl: 0 .  digoxin (LANOXIN) 0.125 MG tablet, TAKE 1 TABLET BY MOUTH ONCE DAILY, Disp: 90 tablet, Rfl: 1 .  furosemide (LASIX) 20 MG tablet, TAKE 1  TABLET BY MOUTH ONCE DAILY, Disp: 90 tablet, Rfl: 3 .  gabapentin (NEURONTIN) 400 MG capsule, TAKE 1 CAPSULE BY MOUTH 3 TIMES A DAY, Disp: 90 capsule, Rfl: 5 .  glipiZIDE (GLUCOTROL XL) 10 MG 24 hr tablet, TAKE 1 TABLET BY MOUTH ONCE A DAY WITH BREAKFAST, Disp: 90 tablet, Rfl: 3 .  lisinopril (PRINIVIL,ZESTRIL) 20 MG tablet, TAKE 1 TABLET BY MOUTH ONCE A DAY, Disp: 90 tablet, Rfl: 3 .  meloxicam (MOBIC) 7.5 MG tablet, TAKE 1 TABLET BY MOUTH TWICE (2) DAILY, Disp: 60 tablet, Rfl: 5 .  metFORMIN (GLUCOPHAGE) 1000 MG tablet, TAKE 1 TABLET BY MOUTH TWICE A DAY WITH A MEAL, Disp: 180 tablet, Rfl: 1 .  ONGLYZA 5 MG TABS tablet, TAKE 1 TABLET BY MOUTH ONCE DAILY, Disp: 90 tablet, Rfl: 1 .  potassium chloride (K-DUR) 10 MEQ tablet, TAKE 1 TABLET BY MOUTH ONCE DAILY, Disp: 90 tablet, Rfl: 1 .  SYRINGE-NEEDLE, DISP, 3 ML (B-D SYRINGE/NEEDLE 3CC/25GX5/8) 25G X 5/8" 3 ML MISC, For use with cyanocobalamin injections, Disp: 50 each, Rfl: 0 .  tamsulosin (FLOMAX) 0.4 MG CAPS capsule, TAKE 1 CAPSULE BY MOUTH ONCE DAILY, Disp: 90 capsule, Rfl: 1 .  amLODipine-atorvastatin (CADUET) 5-20 MG tablet, TAKE 1 TABLET BY MOUTH ONCE DAILY (Patient not taking: Reported on 03/18/2018), Disp: 90 tablet, Rfl: 1 .  cyanocobalamin (,VITAMIN B-12,)  1000 MCG/ML injection, Inject 1050mg once per week for 4 weeks, then inject 10085m monthly (Patient not taking: Reported on 03/18/2018), Disp: 10 mL, Rfl: 3 .  ferrous sulfate (CVS IRON) 325 (65 FE) MG tablet, Take 1 tablet (325 mg total) by mouth daily with breakfast. (Patient not taking: Reported on 03/18/2018), Disp: 30 tablet, Rfl: 3 .  nitroGLYCERIN (NITROSTAT) 0.4 MG SL tablet, Place 1 tablet (0.4 mg total) under the tongue every 5 (five) minutes as needed for chest pain. (Patient not taking: Reported on 03/18/2018), Disp: 25 tablet, Rfl: 6  Review of Systems  Constitutional: Positive for fatigue. Negative for chills and fever.  HENT: Positive for congestion, postnasal drip, sinus  pressure, sinus pain, sneezing and voice change. Negative for ear pain, rhinorrhea, sore throat and trouble swallowing.   Respiratory: Positive for cough and shortness of breath. Negative for chest tightness and wheezing.   Cardiovascular: Negative for chest pain, palpitations and leg swelling.  Endocrine: Positive for cold intolerance and heat intolerance.  Musculoskeletal: Joint pain: "chest congestion"  Neurological: Positive for dizziness, light-headedness and headaches.    Social History   Tobacco Use  . Smoking status: Never Smoker  . Smokeless tobacco: Current User    Types: Chew  . Tobacco comment: chew tobacco - 1 pack last 3 weeks  Substance Use Topics  . Alcohol use: No    Alcohol/week: 0.0 standard drinks   Objective:   BP (!) 148/58 (BP Location: Left Arm, Patient Position: Sitting, Cuff Size: Normal)   Pulse 60   Temp 98.5 F (36.9 C) (Oral)   Resp 16   Wt 181 lb 3.2 oz (82.2 kg)   SpO2 96%   BMI 27.55 kg/m  Vitals:   03/19/18 0834  BP: (!) 148/58  Pulse: 60  Resp: 16  Temp: 98.5 F (36.9 C)  TempSrc: Oral  SpO2: 96%  Weight: 181 lb 3.2 oz (82.2 kg)     Physical Exam  Constitutional: He appears well-developed and well-nourished. No distress.  HENT:  Head: Normocephalic and atraumatic.  Right Ear: Hearing, tympanic membrane and external ear normal.  Left Ear: Hearing, tympanic membrane and external ear normal.  Nose: Nose normal.  Mouth/Throat: Oropharynx is clear and moist. No oropharyngeal exudate.  Eyes: Pupils are equal, round, and reactive to light. Conjunctivae and EOM are normal. Right eye exhibits no discharge. Left eye exhibits no discharge.  Neck: Normal range of motion. Neck supple. No JVD present. No tracheal deviation present. No Brudzinski's sign and no Kernig's sign noted. No thyromegaly present.  Cardiovascular: Normal rate, regular rhythm and normal heart sounds. Exam reveals no gallop and no friction rub.  No murmur  heard. Pulmonary/Chest: Effort normal and breath sounds normal. No stridor. No respiratory distress. He has no wheezes. He has no rales. He exhibits no tenderness.  Lymphadenopathy:    He has no cervical adenopathy.  Skin: Skin is warm and dry.       Assessment & Plan:     1. Upper respiratory tract infection, unspecified type Worsening symptoms that have not responded to OTC medications. Will give augmentin as below. Continue allergy medications. Stay well hydrated and get plenty of rest. Call if no symptom improvement or if symptoms worsen. - amoxicillin-clavulanate (AUGMENTIN) 875-125 MG tablet; Take 1 tablet by mouth 2 (two) times daily.  Dispense: 20 tablet; Refill: 0  2. Cough - benzonatate (TESSALON) 200 MG capsule; Take 1 capsule (200 mg total) by mouth 3 (three) times daily as needed for cough.  Dispense: 30 capsule; Refill: 0       Mar Daring, [name]  [facility]

## 2018-04-05 ENCOUNTER — Other Ambulatory Visit: Payer: Self-pay | Admitting: Physician Assistant

## 2018-04-05 DIAGNOSIS — M2550 Pain in unspecified joint: Secondary | ICD-10-CM

## 2018-04-09 ENCOUNTER — Other Ambulatory Visit: Payer: Self-pay | Admitting: Physician Assistant

## 2018-04-09 DIAGNOSIS — R600 Localized edema: Secondary | ICD-10-CM

## 2018-04-21 ENCOUNTER — Encounter: Payer: Self-pay | Admitting: Physician Assistant

## 2018-04-21 ENCOUNTER — Ambulatory Visit (INDEPENDENT_AMBULATORY_CARE_PROVIDER_SITE_OTHER): Payer: Medicare Other | Admitting: Physician Assistant

## 2018-04-21 VITALS — BP 140/58 | HR 65 | Temp 98.0°F | Resp 16 | Wt 179.0 lb

## 2018-04-21 DIAGNOSIS — I200 Unstable angina: Secondary | ICD-10-CM

## 2018-04-21 DIAGNOSIS — D508 Other iron deficiency anemias: Secondary | ICD-10-CM

## 2018-04-21 DIAGNOSIS — E785 Hyperlipidemia, unspecified: Secondary | ICD-10-CM | POA: Diagnosis not present

## 2018-04-21 DIAGNOSIS — Z23 Encounter for immunization: Secondary | ICD-10-CM

## 2018-04-21 DIAGNOSIS — E118 Type 2 diabetes mellitus with unspecified complications: Secondary | ICD-10-CM

## 2018-04-21 DIAGNOSIS — Z0000 Encounter for general adult medical examination without abnormal findings: Secondary | ICD-10-CM | POA: Diagnosis not present

## 2018-04-21 LAB — POCT GLYCOSYLATED HEMOGLOBIN (HGB A1C)
ESTIMATED AVERAGE GLUCOSE: 183
Hemoglobin A1C: 8 % — AB (ref 4.0–5.6)

## 2018-04-21 MED ORDER — NITROGLYCERIN 0.4 MG SL SUBL: 0.4000 mg | SUBLINGUAL_TABLET | SUBLINGUAL | 6 refills | Status: DC | PRN

## 2018-04-21 NOTE — Progress Notes (Signed)
Patient: [name]., Male    DOB: 11/19/1939, 77 y.o.   MRN: [id number] Visit Date: 04/21/2018  Today's Provider: Mar Daring, [name]   Chief Complaint  Patient presents with  . Annual Exam   Subjective:     Complete Physical [name] Longoria Sr. is a 77 y.o. male. He feels well. He reports exercising, walks. He reports he is sleeping fairly well. -----------------------------------------------------------   Review of Systems  Constitutional: Negative.   HENT: Negative.   Respiratory: Negative.   Cardiovascular: Positive for leg swelling ("takes fluid"). Negative for chest pain and palpitations.    Social History   Socioeconomic History  . Marital status: Widowed    Spouse name: Not on file  . Number of children: 3  . Years of education: Not on file  . Highest education level: 8th grade  Occupational History  . Not on file  Social Needs  . Financial resource strain: Not hard at all  . Food insecurity:    Worry: Never true    Inability: Never true  . Transportation needs:    Medical: No    Non-medical: No  Tobacco Use  . Smoking status: Never Smoker  . Smokeless tobacco: Current User    Types: Chew  . Tobacco comment: chew tobacco - 1 pack last 3 weeks  Substance and Sexual Activity  . Alcohol use: No    Alcohol/week: 0.0 standard drinks  . Drug use: No  . Sexual activity: Not on file  Lifestyle  . Physical activity:    Days per week: Not on file    Minutes per session: Not on file  . Stress: Not at all  Relationships  . Social connections:    Talks on phone: Not on file    Gets together: Not on file    Attends religious service: Not on file    Active member of club or organization: Not on file    Attends meetings of clubs or organizations: Not on file    Relationship status: Widowed  . Intimate partner violence:    Fear of current or ex partner: Not on file    Emotionally abused: Not on file    Physically  abused: Not on file    Forced sexual activity: Not on file  Other Topics Concern  . Not on file  Social History Narrative  . Not on file    Past Medical History:  Diagnosis Date  . Arthritis   . Back pain    hx  . Colon polyps   . DM2 (diabetes mellitus, type 2) (HCC)    stable  . GERD (gastroesophageal reflux disease)   . Glaucoma   . Gout   . HLD (hyperlipidemia)   . HTN (hypertension)   . Neuropathy   . Unstable angina (HCC)    resolved. a. negative myocardial infarction     Patient Active Problem List   Diagnosis Date Noted  . Primary osteoarthritis of both knees 01/20/2018  . CAD (coronary artery disease), native coronary artery 08/04/2017  . Acute pain of left knee 06/12/2017  . Sebaceous cyst 09/10/2016  . Neuropathy 05/22/2016  . Abnormal EKG 11/26/2012  . Type 2 diabetes mellitus with complication, without long-term current use of insulin ([place]) 11/07/2009  . Mixed hyperlipidemia 11/07/2009  . GOUT, UNSPECIFIED 11/07/2009  . Essential hypertension 11/07/2009  . UNSTABLE ANGINA 11/07/2009  . 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 GERD 11/07/2009  . Backache  11/07/2009    Past Surgical History:  Procedure Laterality Date  . BACK SURGERY     Cyst removed  . CORONARY ARTERY BYPASS GRAFT  2004  . KNEE SURGERY      His Family history is unknown by patient.      Current Outpatient Medications:  .  ACCU-CHEK AVIVA PLUS test strip, USE AS DIRECTED TO CHECK FASTING BLOOD SUGAR EVERY MORNING, Disp: 100 each, Rfl: 4 .  allopurinol (ZYLOPRIM) 300 MG tablet, TAKE 1 TABLET BY MOUTH ONCE A DAY, Disp: 90 tablet, Rfl: 1 .  aspirin 81 MG chewable tablet, Chew 81 mg by mouth daily., Disp: , Rfl:  .  atorvastatin (LIPITOR) 20 MG tablet, Take 1 tablet (20 mg total) by mouth daily., Disp: 90 tablet, Rfl: 3 .  BD SHARPS CONTAINER HOME MISC, Dispose of syringes after B12 injections, Disp: 1 each, Rfl: 5 .  Blood Glucose Monitoring Suppl (ACCU-CHEK AVIVA  PLUS) w/Device KIT, To check blood sugar once daily, Disp: 1 kit, Rfl: 0 .  digoxin (LANOXIN) 0.125 MG tablet, TAKE 1 TABLET BY MOUTH ONCE DAILY, Disp: 90 tablet, Rfl: 1 .  furosemide (LASIX) 20 MG tablet, TAKE 1 TABLET BY MOUTH ONCE DAILY, Disp: 90 tablet, Rfl: 3 .  gabapentin (NEURONTIN) 400 MG capsule, TAKE 1 CAPSULE BY MOUTH 3 TIMES A DAY, Disp: 90 capsule, Rfl: 5 .  glipiZIDE (GLUCOTROL XL) 10 MG 24 hr tablet, TAKE 1 TABLET BY MOUTH ONCE A DAY WITH BREAKFAST, Disp: 90 tablet, Rfl: 3 .  lisinopril (PRINIVIL,ZESTRIL) 20 MG tablet, TAKE 1 TABLET BY MOUTH ONCE A DAY, Disp: 90 tablet, Rfl: 3 .  meloxicam (MOBIC) 7.5 MG tablet, TAKE 1 TABLET BY MOUTH TWICE A DAY, Disp: 60 tablet, Rfl: 5 .  metFORMIN (GLUCOPHAGE) 1000 MG tablet, TAKE 1 TABLET BY MOUTH TWICE A DAY WITH A MEAL, Disp: 180 tablet, Rfl: 1 .  ONGLYZA 5 MG TABS tablet, TAKE 1 TABLET BY MOUTH ONCE DAILY, Disp: 90 tablet, Rfl: 1 .  potassium chloride (K-DUR) 10 MEQ tablet, TAKE 1 TABLET BY MOUTH ONCE DAILY, Disp: 90 tablet, Rfl: 1 .  SYRINGE-NEEDLE, DISP, 3 ML (B-D SYRINGE/NEEDLE 3CC/25GX5/8) 25G X 5/8" 3 ML MISC, For use with cyanocobalamin injections, Disp: 50 each, Rfl: 0 .  tamsulosin (FLOMAX) 0.4 MG CAPS capsule, TAKE 1 CAPSULE BY MOUTH ONCE DAILY, Disp: 90 capsule, Rfl: 1 .  amLODipine-atorvastatin (CADUET) 5-20 MG tablet, TAKE 1 TABLET BY MOUTH ONCE DAILY (Patient not taking: Reported on 03/18/2018), Disp: 90 tablet, Rfl: 1 .  cyanocobalamin (,VITAMIN B-12,) 1000 MCG/ML injection, Inject 1068mg once per week for 4 weeks, then inject 10054m monthly (Patient not taking: Reported on 03/18/2018), Disp: 10 mL, Rfl: 3 .  ferrous sulfate ([employer] IRON) 325 (65 FE) MG tablet, Take 1 tablet (325 mg total) by mouth daily with breakfast. (Patient not taking: Reported on 03/18/2018), Disp: 30 tablet, Rfl: 3 .  nitroGLYCERIN (NITROSTAT) 0.4 MG SL tablet, Place 1 tablet (0.4 mg total) under the tongue every 5 (five) minutes as needed for chest pain.  (Patient not taking: Reported on 03/18/2018), Disp: 25 tablet, Rfl: 6  Patient Care Team: BuMar DaringPA-C as PCP - General (Family Medicine) GoRockey SituiKathlene NovemberMD as Consulting Physician (Cardiology) ByBary CastillaeForest GleasonMD (General Surgery)     Objective:   Vitals: BP (!) 140/58 (BP Location: Left Arm, Patient Position: Sitting, Cuff Size: Normal)   Pulse 65   Temp 98 F (36.7 C) (Oral)   Resp 16  Wt 179 lb (81.2 kg)   SpO2 97%   BMI 27.22 kg/m   Physical Exam  Constitutional: He is oriented to person, place, and time. He appears well-developed and well-nourished.  HENT:  Head: Normocephalic and atraumatic.  Right Ear: External ear normal.  Left Ear: External ear normal.  Nose: Nose normal.  Mouth/Throat: Oropharynx is clear and moist.  Eyes: Pupils are equal, round, and reactive to light. Conjunctivae and EOM are normal. Right eye exhibits no discharge.  Neck: Normal range of motion. Neck supple. No tracheal deviation present. No thyromegaly present.  Cardiovascular: Normal rate, regular rhythm, normal heart sounds and intact distal pulses.  No murmur heard. Pulses:      Dorsalis pedis pulses are 2+ on the right side, and 2+ on the left side.       Posterior tibial pulses are 2+ on the right side, and 2+ on the left side.  Pulmonary/Chest: Effort normal and breath sounds normal. No respiratory distress. He has no wheezes. He has no rales. He exhibits no tenderness.  Abdominal: Soft. Bowel sounds are normal. He exhibits no distension and no mass. There is no tenderness. There is no rebound and no guarding.  Musculoskeletal: He exhibits no edema or tenderness.       Right foot: There is decreased range of motion. There is no deformity.       Left foot: There is normal range of motion and no deformity.  Feet:  Right Foot:  Protective Sensation: 10 sites tested. 8 sites sensed.  Skin Integrity: Negative for ulcer, blister, skin breakdown, erythema, warmth, callus or  dry skin.  Left Foot:  Protective Sensation: 10 sites tested. 10 sites sensed.  Skin Integrity: Negative for ulcer, blister, skin breakdown, erythema, warmth, callus or dry skin.  Lymphadenopathy:    He has no cervical adenopathy.  Neurological: He is alert and oriented to person, place, and time. He has normal reflexes. He displays normal reflexes. No cranial nerve deficit. He exhibits normal muscle tone. Coordination normal.  Skin: Skin is warm and dry. No rash noted. No erythema.  Psychiatric: He has a normal mood and affect. His behavior is normal. Judgment and thought content normal.    Activities of Daily Living In your present state of health, do you have any difficulty performing the following activities: 03/18/2018  Hearing? Y  Comment Occasionally, declines hearing aid use.   Vision? N  Difficulty concentrating or making decisions? Y  Walking or climbing stairs? N  Dressing or bathing? N  Doing errands, shopping? N  Preparing Food and eating ? N  Using the Toilet? N  Managing your Medications? N  Managing your Finances? N  Housekeeping or managing your Housekeeping? N  Some recent data might be hidden    Fall Risk Assessment Fall Risk  03/18/2018 03/17/2017 03/13/2016  Falls in the past year? No No No     Depression Screen PHQ 2/9 Scores 03/18/2018 03/17/2017 03/13/2016  PHQ - 2 Score 0 0 0    6CIT Screen 03/18/2018 03/17/2017  What Year? 0 points 0 points  What month? 0 points 0 points  What time? 0 points 0 points  Count back from 20 0 points 0 points  Months in reverse 4 points (No Data)  Repeat phrase 6 points 2 points  Total Score 10 -      Assessment & Plan:    Annual Physical Reviewed patient's Family Medical History Reviewed and updated list of patient's medical providers Assessment of cognitive impairment  was done Assessed patient's functional ability Established a written schedule for health screening [place] Completed and  Reviewed  Exercise Activities and Dietary recommendations Goals    . Increase water intake     Recommend increasing water intake to 6-8 glasses a day.     . Quit Chewing Tobacco     Recommend to continue decreasing amount of tobacco chewed then completely quitting.        Immunization History  Administered Date(s) Administered  . Influenza, High Dose Seasonal PF 03/17/2017  . Influenza,inj,Quad PF,6+ Mos 05/05/2016  . Pneumococcal Conjugate-13 03/13/2016  . Pneumococcal Polysaccharide-23 03/17/2017    Health Maintenance  Topic Date Due  . OPHTHALMOLOGY EXAM  11/20/1950  . TETANUS/TDAP  11/20/1959  . INFLUENZA VACCINE  02/25/2018  . FOOT EXAM  03/19/2018  . HEMOGLOBIN A1C  07/21/2018  . PNA vac Low Risk Adult  Completed     Discussed health benefits of physical activity, and encouraged him to engage in regular exercise appropriate for his age and condition.    1. Annual physical exam Normal physical exam today. Will check labs as below and f/u pending lab results. If labs are stable and WNL he will not need to have these rechecked for one year at his next annual physical exam. He is to call the office in the meantime if he has any acute issue, questions or concerns. - CBC with Differential/Platelet - Comprehensive metabolic panel - TSH  2. Type 2 diabetes mellitus with complication, without long-term current use of insulin (HCC) A1c down to 8.0 from 8.4. Continue medications. I will see him back in 3 months for recheck.  - POCT glycosylated hemoglobin (Hb A1C)  3. Iron deficiency anemia secondary to inadequate dietary iron intake Will check labs. Hemoglobin normalized after iron supplementation.   4. Hyperlipidemia, unspecified hyperlipidemia type Stable. Continue atorvastatin 44m. Will check labs as below and f/u pending results. - Lipid panel  5. UNSTABLE ANGINA Stable. Diagnosis pulled for medication refill. Continue current medical treatment plan. -  nitroGLYCERIN (NITROSTAT) 0.4 MG SL tablet; Place 1 tablet (0.4 mg total) under the tongue every 5 (five) minutes as needed for chest pain.  Dispense: 25 tablet; Refill: 6  6. Need for influenza vaccination Flu vaccine given today without complication. Patient sat upright for 15 minutes to check for adverse reaction before being released. - Flu vaccine HIGH DOSE PF    ------------------------------------------------------------------------------------------------------------    JMar Daring [name]  [facility]

## 2018-04-21 NOTE — Patient Instructions (Signed)

## 2018-04-22 ENCOUNTER — Telehealth: Payer: Self-pay

## 2018-04-22 LAB — COMPREHENSIVE METABOLIC PANEL
ALK PHOS: 50 IU/L (ref 39–117)
ALT: 20 IU/L (ref 0–44)
AST: 19 IU/L (ref 0–40)
Albumin/Globulin Ratio: 1.9 (ref 1.2–2.2)
Albumin: 4.2 g/dL (ref 3.5–4.8)
BUN/Creatinine Ratio: 12 (ref 10–24)
BUN: 17 mg/dL (ref 8–27)
Bilirubin Total: 0.2 mg/dL (ref 0.0–1.2)
CALCIUM: 9.7 mg/dL (ref 8.6–10.2)
CO2: 20 mmol/L (ref 20–29)
CREATININE: 1.39 mg/dL — AB (ref 0.76–1.27)
Chloride: 101 mmol/L (ref 96–106)
GFR calc Af Amer: 56 mL/min/{1.73_m2} — ABNORMAL LOW (ref >59)
GFR, EST NON AFRICAN AMERICAN: 49 mL/min/{1.73_m2} — AB (ref >59)
GLUCOSE: 104 mg/dL — AB (ref 65–99)
Globulin, Total: 2.2 g/dL (ref 1.5–4.5)
Potassium: 5 mmol/L (ref 3.5–5.2)
Sodium: 138 mmol/L (ref 134–144)
TOTAL PROTEIN: 6.4 g/dL (ref 6.0–8.5)

## 2018-04-22 LAB — CBC WITH DIFFERENTIAL/PLATELET
BASOS ABS: 0.1 10*3/uL (ref 0.0–0.2)
Basos: 1 %
EOS (ABSOLUTE): 0.3 10*3/uL (ref 0.0–0.4)
Eos: 5 %
Hematocrit: 37.6 % (ref 37.5–51.0)
Hemoglobin: 12.7 g/dL — ABNORMAL LOW (ref 13.0–17.7)
IMMATURE GRANS (ABS): 0 10*3/uL (ref 0.0–0.1)
IMMATURE GRANULOCYTES: 0 %
LYMPHS: 25 %
Lymphocytes Absolute: 1.8 10*3/uL (ref 0.7–3.1)
MCH: 31.7 pg (ref 26.6–33.0)
MCHC: 33.8 g/dL (ref 31.5–35.7)
MCV: 94 fL (ref 79–97)
MONOS ABS: 0.5 10*3/uL (ref 0.1–0.9)
Monocytes: 7 %
NEUTROS PCT: 62 %
Neutrophils Absolute: 4.5 10*3/uL (ref 1.4–7.0)
PLATELETS: 249 10*3/uL (ref 150–450)
RBC: 4.01 x10E6/uL — ABNORMAL LOW (ref 4.14–5.80)
RDW: 13.1 % (ref 12.3–15.4)
WBC: 7.2 10*3/uL (ref 3.4–10.8)

## 2018-04-22 LAB — TSH: TSH: 3.84 u[IU]/mL (ref 0.450–4.500)

## 2018-04-22 LAB — LIPID PANEL
CHOL/HDL RATIO: 3.4 ratio (ref 0.0–5.0)
Cholesterol, Total: 108 mg/dL (ref 100–199)
HDL: 32 mg/dL — ABNORMAL LOW (ref >39)
LDL CALC: 48 mg/dL (ref 0–99)
TRIGLYCERIDES: 142 mg/dL (ref 0–149)
VLDL CHOLESTEROL CAL: 28 mg/dL (ref 5–40)

## 2018-04-22 NOTE — Telephone Encounter (Signed)
Patient advised as below. Patient verbalizes understanding and is in agreement with treatment plan.  

## 2018-04-22 NOTE — Telephone Encounter (Signed)
-----   Message from Margaretann Loveless, [name] sent at 04/22/2018  9:41 AM EDT ----- Hemoglobin is stable. Sugar is doing well. Kidney function slightly decreased, continue to push fluids and stay well hydrated. Liver function normal. Cholesterol normal.

## 2018-05-17 ENCOUNTER — Other Ambulatory Visit: Payer: Self-pay | Admitting: Physician Assistant

## 2018-05-17 DIAGNOSIS — I482 Chronic atrial fibrillation, unspecified: Secondary | ICD-10-CM

## 2018-05-31 ENCOUNTER — Other Ambulatory Visit: Payer: Self-pay | Admitting: Physician Assistant

## 2018-05-31 DIAGNOSIS — E119 Type 2 diabetes mellitus without complications: Secondary | ICD-10-CM

## 2018-06-14 ENCOUNTER — Other Ambulatory Visit: Payer: Self-pay | Admitting: Physician Assistant

## 2018-06-14 DIAGNOSIS — E118 Type 2 diabetes mellitus with unspecified complications: Secondary | ICD-10-CM

## 2018-07-07 ENCOUNTER — Other Ambulatory Visit: Payer: Self-pay | Admitting: Physician Assistant

## 2018-07-07 DIAGNOSIS — G629 Polyneuropathy, unspecified: Secondary | ICD-10-CM

## 2018-07-09 ENCOUNTER — Other Ambulatory Visit: Payer: Self-pay | Admitting: Physician Assistant

## 2018-07-09 DIAGNOSIS — E118 Type 2 diabetes mellitus with unspecified complications: Secondary | ICD-10-CM

## 2018-07-16 ENCOUNTER — Ambulatory Visit: Payer: Medicare Other | Admitting: Physician Assistant

## 2018-07-16 ENCOUNTER — Encounter: Payer: Self-pay | Admitting: Physician Assistant

## 2018-07-16 VITALS — BP 161/71 | HR 58 | Temp 97.7°F | Resp 16 | Wt 176.0 lb

## 2018-07-16 DIAGNOSIS — I10 Essential (primary) hypertension: Secondary | ICD-10-CM

## 2018-07-16 DIAGNOSIS — E118 Type 2 diabetes mellitus with unspecified complications: Secondary | ICD-10-CM

## 2018-07-16 LAB — POCT GLYCOSYLATED HEMOGLOBIN (HGB A1C)
Est. average glucose Bld gHb Est-mCnc: 169
Hemoglobin A1C: 7.5 % — AB (ref 4.0–5.6)

## 2018-07-16 NOTE — Progress Notes (Signed)
Patient: [name] Filter Sr. Male    DOB: 11/19/1939   77 y.o.   MRN: [id number] Visit Date: 07/16/2018  Today's Provider: Mar Daring, [name]   Chief Complaint  Patient presents with  . Diabetes   Subjective:     HPI  Diabetes Mellitus Type II, Follow-up:   Lab Results  Component Value Date   HGBA1C 7.5 (A) 07/16/2018   HGBA1C 8.0 (A) 04/21/2018   HGBA1C 8.4 (A) 01/19/2018    Last seen for diabetes 3 months ago.  Management since then includes check A1c. He reports excellent compliance with treatment. He is not having side effects.  Current symptoms include none and have been stable. Home blood sugar records: fasting range: 100-120  Episodes of hypoglycemia? no   Current Insulin Regimen:  Most Recent Eye Exam: UTD Weight trend: stable Prior visit with dietician: no Current diet: in general, a "healthy" diet   Current exercise: walking  Pertinent Labs:    Component Value Date/Time   CHOL 108 04/21/2018 0910   TRIG 142 04/21/2018 0910   TRIG 136 02/28/2008   HDL 32 (L) 04/21/2018 0910   LDLCALC 48 04/21/2018 0910   CREATININE 1.39 (H) 04/21/2018 0910   CREATININE 1.43 (H) 06/12/2017 1146    Wt Readings from Last 3 Encounters:  07/16/18 176 lb (79.8 kg)  04/21/18 179 lb (81.2 kg)  03/19/18 181 lb 3.2 oz (82.2 kg)   ------------------------------------------------------------------------   Allergies  Allergen Reactions  . Ramipril      Current Outpatient Medications:  .  ACCU-CHEK AVIVA PLUS test strip, USE AS DIRECTED TO CHECK FASTING BLOOD SUGAR EVERY MORNING, Disp: 100 each, Rfl: 4 .  allopurinol (ZYLOPRIM) 300 MG tablet, TAKE 1 TABLET BY MOUTH ONCE A DAY, Disp: 90 tablet, Rfl: 1 .  aspirin 81 MG chewable tablet, Chew 81 mg by mouth daily., Disp: , Rfl:  .  atorvastatin (LIPITOR) 20 MG tablet, Take 1 tablet (20 mg total) by mouth daily., Disp: 90 tablet, Rfl: 3 .  BD SHARPS CONTAINER HOME MISC, Dispose of syringes after B12  injections, Disp: 1 each, Rfl: 5 .  Blood Glucose Monitoring Suppl (ACCU-CHEK AVIVA PLUS) w/Device KIT, To check blood sugar once daily, Disp: 1 kit, Rfl: 0 .  digoxin (LANOXIN) 0.125 MG tablet, TAKE 1 TABLET BY MOUTH ONCE A DAY, Disp: 90 tablet, Rfl: 1 .  furosemide (LASIX) 20 MG tablet, TAKE 1 TABLET BY MOUTH ONCE DAILY, Disp: 90 tablet, Rfl: 3 .  gabapentin (NEURONTIN) 400 MG capsule, TAKE 1 CAPSULE BY MOUTH 3 TIMES A DAY, Disp: 90 capsule, Rfl: 5 .  glipiZIDE (GLUCOTROL XL) 10 MG 24 hr tablet, TAKE 1 TABLET BY MOUTH ONCE A DAY WITH BREAKFAST, Disp: 90 tablet, Rfl: 3 .  lisinopril (PRINIVIL,ZESTRIL) 20 MG tablet, TAKE 1 TABLET BY MOUTH ONCE A DAY, Disp: 90 tablet, Rfl: 3 .  meloxicam (MOBIC) 7.5 MG tablet, TAKE 1 TABLET BY MOUTH TWICE A DAY, Disp: 60 tablet, Rfl: 5 .  metFORMIN (GLUCOPHAGE) 1000 MG tablet, TAKE 1 TABLET BY MOUTH TWICE A DAY WITH A MEAL, Disp: 180 tablet, Rfl: 1 .  nitroGLYCERIN (NITROSTAT) 0.4 MG SL tablet, Place 1 tablet (0.4 mg total) under the tongue every 5 (five) minutes as needed for chest pain., Disp: 25 tablet, Rfl: 6 .  ONGLYZA 5 MG TABS tablet, TAKE 1 TABLET BY MOUTH ONCE DAILY, Disp: 90 tablet, Rfl: 1 .  potassium chloride (K-DUR) 10 MEQ tablet, TAKE 1 TABLET BY  MOUTH ONCE DAILY, Disp: 90 tablet, Rfl: 1 .  SYRINGE-NEEDLE, DISP, 3 ML (B-D SYRINGE/NEEDLE 3CC/25GX5/8) 25G X 5/8" 3 ML MISC, For use with cyanocobalamin injections, Disp: 50 each, Rfl: 0 .  tamsulosin (FLOMAX) 0.4 MG CAPS capsule, TAKE 1 CAPSULE BY MOUTH ONCE DAILY, Disp: 90 capsule, Rfl: 1  Review of Systems  Constitutional: Negative.   Respiratory: Negative.   Cardiovascular: Negative.   Gastrointestinal: Negative.   Endocrine: Negative.     Social History   Tobacco Use  . Smoking status: Never Smoker  . Smokeless tobacco: Current User    Types: Chew  . Tobacco comment: chew tobacco - 1 pack last 3 weeks  Substance Use Topics  . Alcohol use: No    Alcohol/week: 0.0 standard drinks        Objective:   BP (!) 161/71 (BP Location: Left Arm, Patient Position: Sitting, Cuff Size: Normal)   Pulse (!) 58   Temp 97.7 F (36.5 C) (Oral)   Resp 16   Wt 176 lb (79.8 kg)   BMI 26.76 kg/m  Vitals:   07/16/18 0754  BP: (!) 161/71  Pulse: (!) 58  Resp: 16  Temp: 97.7 F (36.5 C)  TempSrc: Oral  Weight: 176 lb (79.8 kg)     Physical Exam Vitals signs reviewed.  Constitutional:      General: He is not in acute distress.    Appearance: He is well-developed. He is not diaphoretic.  HENT:     Head: Normocephalic and atraumatic.  Neck:     Musculoskeletal: Normal range of motion and neck supple.  Cardiovascular:     Rate and Rhythm: Normal rate and regular rhythm.     Heart sounds: Normal heart sounds. No murmur. No friction rub. No gallop.   Pulmonary:     Effort: Pulmonary effort is normal. No respiratory distress.     Breath sounds: Normal breath sounds. No wheezing or rales.        Assessment & Plan    1. Type 2 diabetes mellitus with complication, without long-term current use of insulin (HCC) A1c down to 7.5 from 8.0. Continue medications as prescribed. I will see him back in 3 months.  - POCT glycosylated hemoglobin (Hb A1C)  2. Essential hypertension Elevated today. Patient thinks due to stress from anxiety of driving over here. Advised to check BP at home. Call if still over 150/90. Will recheck in 3 months.      Mar Daring, [name]  [facility]

## 2018-07-30 ENCOUNTER — Other Ambulatory Visit: Payer: Self-pay | Admitting: Physician Assistant

## 2018-07-30 DIAGNOSIS — E119 Type 2 diabetes mellitus without complications: Secondary | ICD-10-CM

## 2018-08-18 ENCOUNTER — Ambulatory Visit: Payer: Medicare Other | Admitting: Nurse Practitioner

## 2018-08-18 ENCOUNTER — Encounter: Payer: Self-pay | Admitting: Nurse Practitioner

## 2018-08-18 VITALS — BP 144/60 | HR 81 | Ht 67.0 in | Wt 179.0 lb

## 2018-08-18 DIAGNOSIS — I10 Essential (primary) hypertension: Secondary | ICD-10-CM | POA: Diagnosis not present

## 2018-08-18 DIAGNOSIS — Z79899 Other long term (current) drug therapy: Secondary | ICD-10-CM

## 2018-08-18 DIAGNOSIS — I2510 Atherosclerotic heart disease of native coronary artery without angina pectoris: Secondary | ICD-10-CM | POA: Diagnosis not present

## 2018-08-18 DIAGNOSIS — E785 Hyperlipidemia, unspecified: Secondary | ICD-10-CM | POA: Diagnosis not present

## 2018-08-18 DIAGNOSIS — Z5181 Encounter for therapeutic drug level monitoring: Secondary | ICD-10-CM | POA: Diagnosis not present

## 2018-08-18 DIAGNOSIS — E119 Type 2 diabetes mellitus without complications: Secondary | ICD-10-CM

## 2018-08-18 NOTE — Patient Instructions (Signed)
Medication Instructions:  Your physician recommends that you continue on your current medications as directed. Please refer to the Current Medication list given to you today.  If you need a refill on your cardiac medications before your next appointment, please call your pharmacy.   Lab work: Your physician recommends that you return for lab work in: today - digoxin level.   If you have labs (blood work) drawn today and your tests are completely normal, you will receive your results only by: Marland Kitchen MyChart Message (if you have MyChart) OR . A paper copy in the mail If you have any lab test that is abnormal or we need to change your treatment, we will call you to review the results.  Testing/Procedures: none  Follow-Up: At [facility], you and your health needs are our priority.  As part of our continuing mission to provide you with exceptional heart care, we have created designated Provider Care Teams.  These Care Teams include your primary Cardiologist (physician) and Advanced Practice Providers (APPs -  Physician Assistants and Nurse Practitioners) who all work together to provide you with the care you need, when you need it. You will need a follow up appointment in 12 months.  Please call our office 2 months in advance to schedule this appointment.  You may see Julien Nordmann, MD or one of the following Advanced Practice Providers on your designated Care Team:   Nicolasa Ducking, NP Eula Listen, [name] . Marisue Ivan, [name]

## 2018-08-18 NOTE — Progress Notes (Signed)
Office Visit    Patient Name: [name]. Date of Encounter: 08/18/2018  Primary Care Provider:  Mar Daring, [name] Primary Cardiologist:  [name], MD  Chief Complaint    78 y/o ? with a h/o CAD s/p CABG, HTN, HL, CKD II-III, and DM II who presents today for f/u of CAD and palpitations.  Past Medical History    Past Medical History:  Diagnosis Date  . Arthritis   . Back pain    hx  . CAD (coronary artery disease)    a. 2004 s/p CABG x 4 (LIMA->LAD, VG->Diag, VG->OM, VG->RCA); b. 2007 Cath: VG->Diag occluded, otw 3/4 patent grafts.  . CKD (chronic kidney disease), stage II-III   . Colon polyps   . DM2 (diabetes mellitus, type 2) (HCC)    stable  . GERD (gastroesophageal reflux disease)   . Glaucoma   . Gout   . HLD (hyperlipidemia)   . HTN (hypertension)   . Neuropathy    Past Surgical History:  Procedure Laterality Date  . BACK SURGERY     Cyst removed  . CORONARY ARTERY BYPASS GRAFT  2004  . KNEE SURGERY      Allergies  Allergies  Allergen Reactions  . Ramipril     History of Present Illness    78 year old male with the above past medical history including CAD status post CABG x4 in 2004 with subsequent catheterization in 2007 revealing 3 of 4 patent grafts (occluded vein graft to the diagonal).  Other history includes hypertension, hyperlipidemia, type 2 diabetes mellitus, and stage II-III chronic kidney disease.  He was last seen in clinic in January 2019.  He says that over the past year, he has done well from a cardiac standpoint.  He is very active and says he likes to stay outside.  Since his wife died just over a year ago, he does not like to be in the house.  He will fill his days with yard work and chopping or milling wood.  He is currently chopping wood now in preparation for next winter, as he heats his home with a wood-burning stove.  With all of his activity, he is completely asymptomatic and denies chest pain,  dyspnea, or any change in exercise tolerance.  He has noted over the past year occasional episodes of fluttering in his chest, lasting about 3 seconds, without associated symptoms, and resolving spontaneously.  He says it almost feels like something is vibrating in his pocket though it deeper in his chest.  He denies PND, orthopnea, dizziness, syncope, or early satiety.  He does have mild lower extremity swelling which resolves by the following morning.  Home Medications    Prior to Admission medications   Medication Sig Start Date End Date Taking? Authorizing Provider  ACCU-CHEK AVIVA PLUS test strip USE AS DIRECTED TO CHECK FASTING BLOOD SUGAR EVERY MORNING 07/09/18   Mar Daring, [name]  allopurinol (ZYLOPRIM) 300 MG tablet TAKE 1 TABLET BY MOUTH ONCE A DAY 02/03/18   Mar Daring, [name]  aspirin 81 MG chewable tablet Chew 81 mg by mouth daily.    [provider]  atorvastatin (LIPITOR) 20 MG tablet Take 1 tablet (20 mg total) by mouth daily. 08/05/17   Minna Merritts, MD  BD SHARPS CONTAINER HOME MISC Dispose of syringes after B12 injections 10/19/17   Mar Daring, [name]  Blood Glucose Monitoring Suppl (ACCU-CHEK AVIVA PLUS) w/Device KIT To check blood sugar once daily 06/11/17  Fenton Malling M, [name]  digoxin (LANOXIN) 0.125 MG tablet TAKE 1 TABLET BY MOUTH ONCE A DAY 05/17/18   Fenton Malling M, [name]  furosemide (LASIX) 20 MG tablet TAKE 1 TABLET BY MOUTH ONCE DAILY 03/09/18   Mar Daring, [name]  gabapentin (NEURONTIN) 400 MG capsule TAKE 1 CAPSULE BY MOUTH 3 TIMES A DAY 07/07/18   Fenton Malling M, [name]  glipiZIDE (GLUCOTROL XL) 10 MG 24 hr tablet TAKE 1 TABLET BY MOUTH ONCE A DAY WITH BREAKFAST 06/14/18   Fenton Malling M, [name]  lisinopril (PRINIVIL,ZESTRIL) 20 MG tablet TAKE 1 TABLET BY MOUTH ONCE A DAY 03/09/18   Fenton Malling M, [name]  meloxicam (MOBIC) 7.5 MG tablet TAKE 1 TABLET BY MOUTH TWICE A DAY 04/05/18   Fenton Malling M,  [name]  metFORMIN (GLUCOPHAGE) 1000 MG tablet TAKE 1 TABLET BY MOUTH TWICE A DAY WITH A MEAL 05/31/18   Fenton Malling M, [name]  nitroGLYCERIN (NITROSTAT) 0.4 MG SL tablet Place 1 tablet (0.4 mg total) under the tongue every 5 (five) minutes as needed for chest pain. 04/21/18   Mar Daring, [name]  ONGLYZA 5 MG TABS tablet TAKE 1 TABLET BY MOUTH ONCE DAILY 07/30/18   Chrismon, Vickki Muff, PA  potassium chloride (K-DUR) 10 MEQ tablet TAKE 1 TABLET BY MOUTH ONCE DAILY 04/09/18   Mar Daring, [name]  SYRINGE-NEEDLE, DISP, 3 ML (B-D SYRINGE/NEEDLE 3CC/25GX5/8) 25G X 5/8" 3 ML MISC For use with cyanocobalamin injections 10/19/17   Mar Daring, [name]  tamsulosin (FLOMAX) 0.4 MG CAPS capsule TAKE 1 CAPSULE BY MOUTH ONCE DAILY 02/03/18   Mar Daring, [name]    Review of Systems    Occasional palpitations and mild lower extremity swelling.  He denies chest pain, dyspnea, PND, orthopnea, dizziness, syncope, or early satiety.  All other systems reviewed and are otherwise negative except as noted above.  Physical Exam    VS:  BP (!) 144/60 (BP Location: Left Arm, Patient Position: Sitting, Cuff Size: Normal)   Pulse 81   Ht '5\' 7"'  (1.702 m)   Wt 179 lb (81.2 kg)   BMI 28.04 kg/m  , BMI Body mass index is 28.04 kg/m. GEN: Well nourished, well developed, in no acute distress. HEENT: normal. Neck: Supple, no JVD, carotid bruits, or masses. Cardiac: RRR, no murmurs, rubs, or gallops. No clubbing, cyanosis, trace to 1+ bilateral lower extremity edema just above his sock line.  Radials/PT 1+ and equal bilaterally.  Respiratory:  Respirations regular and unlabored, clear to auscultation bilaterally. GI: Soft, nontender, nondistended, BS + x 4. MS: no deformity or atrophy. Skin: warm and dry, no rash. Neuro:  Strength and sensation are intact. Psych: Normal affect.  Accessory Clinical Findings    ECG personally reviewed by me today -regular sinus rhythm, 81, baseline artifact with  nonspecific ST and T changes- no acute changes.  Lab Results  Component Value Date   CREATININE 1.39 (H) 04/21/2018   BUN 17 04/21/2018   NA 138 04/21/2018   K 5.0 04/21/2018   CL 101 04/21/2018   CO2 20 04/21/2018   Lab Results  Component Value Date   ALT 20 04/21/2018   AST 19 04/21/2018   ALKPHOS 50 04/21/2018   BILITOT 0.2 04/21/2018   Lab Results  Component Value Date   CHOL 108 04/21/2018   HDL 32 (L) 04/21/2018   LDLCALC 48 04/21/2018   TRIG 142 04/21/2018   CHOLHDL 3.4 04/21/2018    Digoxin level 03/19/2017 1.6  Assessment &  Plan    1.  Coronary artery disease: Status post CABG 2004 with 3 of 4 patent grafts on catheterization in 2007.  Over the past year, he has done well and remains active without symptoms or limitations in his exercise tolerance.  He remains on aspirin, statin, and ACE inhibitor therapy.  2.  Essential hypertension: Blood pressure elevated today at 144/60.  He does check his blood pressure a few times a week and says it typically runs in the 1 teens.  I have asked him to check it daily over the next week and call us in a week with numbers.  Would have a low threshold to titrate lisinopril if necessary.  4.  Hyperlipidemia: LDL was 48 in September with normal LFTs.  He remains on statin therapy.  5.  Elevated serum digoxin: Patient is on digoxin 0.125 mg daily.  He says he has been on this for a long time.  I am unsure of the current indication.  He had a digoxin level checked by primary care in August 2018, and it was elevated at 1.6.  It does not appear that there was a dosage change.  I will follow-up a digoxin level today.  I would have a low threshold to reduce or discontinue altogether if necessary.  He denies any symptoms of digoxin toxicity.  6.  Stage II-III chronic kidney disease: Creatinine in September was mildly elevated at 1.39.  He is on ACE inhibitor and diuretic therapy.  7.  Type 2 diabetes mellitus: A1c 7.5 in September.  Managed by  primary care.  8.  Disposition: Follow-up digoxin level today.  Follow-up in clinic in 1 year or sooner if necessary.  He will call us with blood pressure numbers in a week if consistently greater than 119 mmHg systolic.  Murray Hodgkins, NP 08/18/2018, 12:25 PM

## 2018-08-19 ENCOUNTER — Telehealth: Payer: Self-pay | Admitting: *Deleted

## 2018-08-19 DIAGNOSIS — I482 Chronic atrial fibrillation, unspecified: Secondary | ICD-10-CM

## 2018-08-19 DIAGNOSIS — R7889 Finding of other specified substances, not normally found in blood: Secondary | ICD-10-CM

## 2018-08-19 DIAGNOSIS — Z5181 Encounter for therapeutic drug level monitoring: Secondary | ICD-10-CM

## 2018-08-19 DIAGNOSIS — Z79899 Other long term (current) drug therapy: Principal | ICD-10-CM

## 2018-08-19 LAB — DIGOXIN LEVEL: DIGOXIN, SERUM: 1 ng/mL — AB (ref 0.5–0.9)

## 2018-08-19 MED ORDER — DIGOXIN 125 MCG PO TABS: 0.0625 mg | ORAL_TABLET | Freq: Every day | ORAL | 1 refills | Status: DC

## 2018-08-19 NOTE — Telephone Encounter (Signed)
Results called to pt. Pt verbalized understanding of results, plan of care, and to decrease digoxin to 0.0625mg  (0.5 tablet) by mouth once a day. He would like to come to our office for the lab work. Order entered and transferred to scheduler.

## 2018-08-19 NOTE — Telephone Encounter (Signed)
-----   Message from Creig Hines, NP sent at 08/19/2018  8:52 AM EST ----- Digoxin level mildly elevated.  Reduce dose to 0.0625 mg daily (1/2 of a 0.125mg  tab).  F/u digoxin level in 2 wks.

## 2018-08-23 ENCOUNTER — Telehealth: Payer: Self-pay | Admitting: Cardiovascular Disease

## 2018-08-23 NOTE — Telephone Encounter (Signed)
Patient returning call about med change

## 2018-08-23 NOTE — Telephone Encounter (Signed)
Spoke with the patient. He wanted to clarify that he was supposed to cut the digoxin in half, per the results.  Message from Creig Hines, NP sent at 08/19/2018  8:52 AM EST ----- Digoxin level mildly elevated.  Reduce dose to 0.0625 mg daily (1/2 of a 0.125mg  tab).  F/u digoxin level in 2 wks.  This has been confirmed with him. He will call back if anything further is needed.

## 2018-08-30 ENCOUNTER — Telehealth: Payer: Self-pay

## 2018-08-30 NOTE — Telephone Encounter (Signed)
LMTCB-to ask patient about some orders that we received for a wrist hand finger orthosis. Per provider patient has not been seen for any wrist pain.

## 2018-08-31 ENCOUNTER — Other Ambulatory Visit: Payer: Self-pay | Admitting: Cardiovascular Disease

## 2018-09-01 ENCOUNTER — Other Ambulatory Visit: Payer: Medicare Other

## 2018-09-08 ENCOUNTER — Other Ambulatory Visit: Payer: Self-pay | Admitting: Physician Assistant

## 2018-09-08 DIAGNOSIS — M2550 Pain in unspecified joint: Secondary | ICD-10-CM

## 2018-09-08 DIAGNOSIS — E119 Type 2 diabetes mellitus without complications: Secondary | ICD-10-CM

## 2018-09-08 DIAGNOSIS — N401 Enlarged prostate with lower urinary tract symptoms: Secondary | ICD-10-CM

## 2018-09-08 DIAGNOSIS — R3911 Hesitancy of micturition: Principal | ICD-10-CM

## 2018-09-13 NOTE — Telephone Encounter (Signed)
Scheduled patient for Wednesday.

## 2018-09-13 NOTE — Telephone Encounter (Signed)
Roderic Scarce [id number]    Pt Ref # [id number] Checking on status of  wrist brace paperwork.  Please advise.  Thanks, [employer]

## 2018-09-14 NOTE — Progress Notes (Signed)
Patient: [name] Akkerman Sr. Male    DOB: 11/19/1939   78 y.o.   MRN: [id number] Visit Date: 09/15/2018  Today's Provider: Mar Daring, [name]   Chief Complaint  Patient presents with  . Wrist Pain   Subjective:     HPI  Patient here today with c/o wrist/ hand pain. Patient reports that is his arthritis. Reports that his right hand hurts more. Patient requesting a brace to help him. 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 His left wrist has been worsening just recently. 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   Also has decreased digoxin dose to half as advised by Dr. Rockey Situ. He is due to have labs rechecked and hasn't had done yet.    Allergies  Allergen Reactions  . Ramipril      Current Outpatient Medications:  .  ACCU-CHEK AVIVA PLUS test strip, USE AS DIRECTED TO CHECK FASTING BLOOD SUGAR EVERY MORNING, Disp: 100 each, Rfl: 4 .  allopurinol (ZYLOPRIM) 300 MG tablet, TAKE 1 TABLET BY MOUTH ONCE A DAY, Disp: 90 tablet, Rfl: 1 .  aspirin 81 MG chewable tablet, Chew 81 mg by mouth daily., Disp: , Rfl:  .  atorvastatin (LIPITOR) 20 MG tablet, TAKE 1 TABLET BY MOUTH ONCE DAILY, Disp: 90 tablet, Rfl: 3 .  BD SHARPS CONTAINER HOME MISC, Dispose of syringes after B12 injections, Disp: 1 each, Rfl: 5 .  Blood Glucose Monitoring Suppl (ACCU-CHEK AVIVA PLUS) w/Device KIT, To check blood sugar once daily, Disp: 1 kit, Rfl: 0 .  digoxin (LANOXIN) 0.125 MG tablet, Take 0.5 tablets (0.0625 mg total) by mouth daily., Disp: 90 tablet, Rfl: 1 .  furosemide (LASIX) 20 MG tablet, TAKE 1 TABLET BY MOUTH ONCE DAILY, Disp: 90 tablet, Rfl: 3 .  gabapentin (NEURONTIN) 400 MG capsule, TAKE 1 CAPSULE BY MOUTH 3 TIMES A DAY, Disp: 90 capsule, Rfl: 5 .  glipiZIDE (GLUCOTROL XL) 10 MG 24 hr tablet, TAKE 1 TABLET BY MOUTH ONCE A  DAY WITH BREAKFAST, Disp: 90 tablet, Rfl: 3 .  lisinopril (PRINIVIL,ZESTRIL) 20 MG tablet, TAKE 1 TABLET BY MOUTH ONCE A DAY, Disp: 90 tablet, Rfl: 3 .  meloxicam (MOBIC) 7.5 MG tablet, TAKE 1 TABLET BY MOUTH TWICE A DAY, Disp: 180 tablet, Rfl: 1 .  metFORMIN (GLUCOPHAGE) 1000 MG tablet, TAKE 1 TABLET BY MOUTH TWICE (2) DAILY WITH A MEAL, Disp: 180 tablet, Rfl: 1 .  nitroGLYCERIN (NITROSTAT) 0.4 MG SL tablet, Place 1 tablet (0.4 mg total) under the tongue every 5 (five) minutes as needed for chest pain., Disp: 25 tablet, Rfl: 6 .  ONGLYZA 5 MG TABS tablet, TAKE 1 TABLET BY MOUTH ONCE DAILY, Disp: 90 tablet, Rfl: 1 .  potassium chloride (K-DUR) 10 MEQ tablet, TAKE 1 TABLET BY MOUTH ONCE DAILY, Disp: 90 tablet, Rfl: 1 .  SYRINGE-NEEDLE, DISP, 3 ML (B-D SYRINGE/NEEDLE 3CC/25GX5/8) 25G X 5/8" 3 ML MISC, For use with cyanocobalamin injections, Disp: 50 each, Rfl: 0 .  tamsulosin (FLOMAX) 0.4 MG CAPS capsule, TAKE 1 CAPSULE BY MOUTH ONCE DAILY, Disp: 90 capsule, Rfl: 1  Review of Systems  Constitutional: Negative.   Respiratory: Negative.   Cardiovascular: Negative.   Musculoskeletal: Positive for arthralgias.  Neurological: Negative for weakness and numbness.    Social History   Tobacco  Use  . Smoking status: Never Smoker  . Smokeless tobacco: Current User    Types: Chew  . Tobacco comment: chew tobacco - 1 pack last 3 weeks  Substance Use Topics  . Alcohol use: No    Alcohol/week: 0.0 standard drinks      Objective:   BP 125/63 (BP Location: Left Arm, Patient Position: Sitting, Cuff Size: Large)   Pulse 70   Temp 97.9 F (36.6 C) (Oral)   Resp 16   Wt 178 lb 3.2 oz (80.8 kg)   SpO2 97%   BMI 27.91 kg/m  Vitals:   09/15/18 0706  BP: 125/63  Pulse: 70  Resp: 16  Temp: 97.9 F (36.6 C)  TempSrc: Oral  SpO2: 97%  Weight: 178 lb 3.2 oz (80.8 kg)     Physical Exam Vitals signs reviewed.  Constitutional:      General: He is not in acute distress.    Appearance: He is  well-developed. He is not diaphoretic.  HENT:     Head: Normocephalic and atraumatic.  Neck:     Musculoskeletal: Normal range of motion and neck supple.  Cardiovascular:     Rate and Rhythm: Normal rate and regular rhythm.     Heart sounds: Normal heart sounds. No murmur. No friction rub. No gallop.   Pulmonary:     Effort: Pulmonary effort is normal. No respiratory distress.     Breath sounds: Normal breath sounds. No wheezing or rales.  Musculoskeletal:     Right wrist: He exhibits decreased range of motion, tenderness and swelling.     Left wrist: He exhibits decreased range of motion. He exhibits no tenderness and no swelling.     Right hand: He exhibits decreased range of motion, tenderness and swelling (over PIP joint of 3rd finger). He exhibits normal capillary refill.     Left hand: He exhibits decreased range of motion and swelling (distal ends of 2nd and 3rd fingers; previous injury). He exhibits normal capillary refill. Normal sensation noted. He exhibits no wrist extension trouble.        Assessment & Plan    1. Wrist arthritis Patient does have meloxicam on hand but states he does not take daily. Also will complete form to get him wrist braces for support. He agrees with plan.   2. 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 Dose had been decreased due to high levels. I will recheck and forward to Dr. Rockey Situ since he missed his last check due to weather.  - Digoxin level     Mar Daring, [name]  [place]

## 2018-09-15 ENCOUNTER — Encounter: Payer: Self-pay | Admitting: Physician Assistant

## 2018-09-15 ENCOUNTER — Ambulatory Visit: Payer: Medicare Other | Admitting: Physician Assistant

## 2018-09-15 VITALS — BP 125/63 | HR 70 | Temp 97.9°F | Resp 16 | Wt 178.2 lb

## 2018-09-15 DIAGNOSIS — M19039 Primary osteoarthritis, unspecified wrist: Secondary | ICD-10-CM | POA: Diagnosis not present

## 2018-09-15 DIAGNOSIS — I25718 Atherosclerosis of autologous vein coronary artery bypass graft(s) with other forms of angina pectoris: Secondary | ICD-10-CM | POA: Diagnosis not present

## 2018-09-15 NOTE — Patient Instructions (Signed)
Wrist Pain, Adult  There are many things that can cause wrist pain. Some common causes include:   An injury to the wrist area, such as a sprain, strain, or fracture.   Overuse of the joint.   A condition that causes increased pressure on a nerve in the wrist (carpal tunnel syndrome).   Wear and tear of the joints that occurs with aging (osteoarthritis).   A variety of other types of arthritis.  Sometimes, the cause of wrist pain is not known. Often, the pain goes away when you follow instructions from your health care provider for relieving pain at home, such as resting or icing the wrist. If your wrist pain continues, it is important to tell your health care provider.  Follow these instructions at home:   Rest the wrist area for at least 48 hours or as long as told by your health care provider.   If a splint or elastic bandage has been applied, use it as told by your health care provider.  ? Remove the splint or bandage only as told by your health care provider.  ? Loosen the splint or bandage if your fingers tingle, become numb, or turn cold or blue.   If directed, apply ice to the injured area.  ? If you have a removable splint or elastic bandage, remove it as told by your health care provider.  ? Put ice in a plastic bag.  ? Place a towel between your skin and the bag or between your splint or bandage and the bag.  ? Leave the ice on for 20 minutes, 2-3 times a day.     Keep your arm raised (elevated) above the level of your heart while you are sitting or lying down.   Take over-the-counter and prescription medicines only as told by your health care provider.   Keep all follow-up visits as told by your health care provider. This is important.  Contact a health care provider if:   You have a sudden sharp pain in the wrist, hand, or arm that is different or new.   The swelling or bruising on your wrist or hand gets worse.   Your skin becomes red, gets a rash, or has open sores.   Your pain does not  get better or it gets worse.  Get help right away if:   You lose feeling in your fingers or hand.   Your fingers turn white, very red, or cold and blue.   You cannot move your fingers.   You have a fever or chills.  This information is not intended to replace advice given to you by your health care provider. Make sure you discuss any questions you have with your health care provider.  Document Released: 04/23/2005 Document Revised: 02/07/2016 Document Reviewed: 01/31/2016  Elsevier Interactive Patient Education  2019 [employer]

## 2018-09-16 LAB — DIGOXIN LEVEL: Digoxin, Serum: 0.8 ng/mL (ref 0.5–0.9)

## 2018-09-17 ENCOUNTER — Telehealth: Payer: Self-pay

## 2018-09-17 NOTE — Telephone Encounter (Signed)
Patient was advised.  

## 2018-09-17 NOTE — Telephone Encounter (Signed)
-----   Message from Margaretann Loveless, [place] sent at 09/17/2018  2:04 PM EST ----- Digoxin now normal range. I will forward to Dr. Mariah Milling.

## 2018-10-15 ENCOUNTER — Ambulatory Visit: Payer: Self-pay | Admitting: Physician Assistant

## 2018-10-19 ENCOUNTER — Other Ambulatory Visit: Payer: Self-pay | Admitting: Physician Assistant

## 2018-10-19 DIAGNOSIS — R600 Localized edema: Secondary | ICD-10-CM

## 2018-11-17 ENCOUNTER — Ambulatory Visit: Payer: Self-pay | Admitting: Physician Assistant

## 2018-11-17 ENCOUNTER — Other Ambulatory Visit: Payer: Self-pay | Admitting: Physician Assistant

## 2018-11-17 DIAGNOSIS — M1A9XX0 Chronic gout, unspecified, without tophus (tophi): Secondary | ICD-10-CM

## 2018-12-24 ENCOUNTER — Other Ambulatory Visit: Payer: Self-pay | Admitting: Physician Assistant

## 2018-12-24 DIAGNOSIS — R3911 Hesitancy of micturition: Secondary | ICD-10-CM

## 2018-12-24 DIAGNOSIS — N401 Enlarged prostate with lower urinary tract symptoms: Secondary | ICD-10-CM

## 2019-01-07 NOTE — Progress Notes (Signed)
Patient: [name] Kluever Sr. Male    DOB: 11/19/1939   78 y.o.   MRN: [id number] Visit Date: 01/10/2019  Today's Provider: Mar Daring, [name]   Chief Complaint  Patient presents with  . Follow-up  . Diabetes  . Hypertension   Subjective:     HPI   Type 2 diabetes mellitus with complication, without long-term current use of insulin ([place]) From 07/16/2018-no changes. A1c 7.5.  Essential hypertension From 07/16/2018-Advised to check BP at home. Call if still over 150/90. Will recheck in 3 months.   He reports overall he is feeling well. He has no complaints.   Allergies  Allergen Reactions  . Ramipril      Current Outpatient Medications:  .  ACCU-CHEK AVIVA PLUS test strip, USE AS DIRECTED TO CHECK FASTING BLOOD SUGAR EVERY MORNING, Disp: 100 each, Rfl: 4 .  allopurinol (ZYLOPRIM) 300 MG tablet, TAKE 1 TABLET BY MOUTH ONCE A DAY, Disp: 90 tablet, Rfl: 1 .  aspirin 81 MG chewable tablet, Chew 81 mg by mouth daily., Disp: , Rfl:  .  atorvastatin (LIPITOR) 20 MG tablet, TAKE 1 TABLET BY MOUTH ONCE DAILY, Disp: 90 tablet, Rfl: 3 .  BD SHARPS CONTAINER HOME MISC, Dispose of syringes after B12 injections, Disp: 1 each, Rfl: 5 .  Blood Glucose Monitoring Suppl (ACCU-CHEK AVIVA PLUS) w/Device KIT, To check blood sugar once daily, Disp: 1 kit, Rfl: 0 .  digoxin (LANOXIN) 0.125 MG tablet, Take 0.5 tablets (0.0625 mg total) by mouth daily., Disp: 90 tablet, Rfl: 1 .  furosemide (LASIX) 20 MG tablet, TAKE 1 TABLET BY MOUTH ONCE DAILY, Disp: 90 tablet, Rfl: 3 .  gabapentin (NEURONTIN) 400 MG capsule, TAKE 1 CAPSULE BY MOUTH 3 TIMES A DAY, Disp: 90 capsule, Rfl: 5 .  glipiZIDE (GLUCOTROL XL) 10 MG 24 hr tablet, TAKE 1 TABLET BY MOUTH ONCE A DAY WITH BREAKFAST, Disp: 90 tablet, Rfl: 3 .  lisinopril (PRINIVIL,ZESTRIL) 20 MG tablet, TAKE 1 TABLET BY MOUTH ONCE A DAY, Disp: 90 tablet, Rfl: 3 .  meloxicam (MOBIC) 7.5 MG tablet, TAKE 1 TABLET BY MOUTH TWICE A DAY, Disp:  180 tablet, Rfl: 1 .  metFORMIN (GLUCOPHAGE) 1000 MG tablet, TAKE 1 TABLET BY MOUTH TWICE (2) DAILY WITH A MEAL, Disp: 180 tablet, Rfl: 1 .  nitroGLYCERIN (NITROSTAT) 0.4 MG SL tablet, Place 1 tablet (0.4 mg total) under the tongue every 5 (five) minutes as needed for chest pain., Disp: 25 tablet, Rfl: 6 .  potassium chloride (K-DUR) 10 MEQ tablet, TAKE 1 TABLET BY MOUTH ONCE DAILY, Disp: 90 tablet, Rfl: 1 .  SYRINGE-NEEDLE, DISP, 3 ML (B-D SYRINGE/NEEDLE 3CC/25GX5/8) 25G X 5/8" 3 ML MISC, For use with cyanocobalamin injections, Disp: 50 each, Rfl: 0 .  tamsulosin (FLOMAX) 0.4 MG CAPS capsule, TAKE 1 CAPSULE BY MOUTH ONCE DAILY, Disp: 90 capsule, Rfl: 1 .  ONGLYZA 5 MG TABS tablet, TAKE 1 TABLET BY MOUTH ONCE DAILY, Disp: 90 tablet, Rfl: 1  Review of Systems  Constitutional: Negative for appetite change, chills and fever.  HENT: Negative.   Respiratory: Negative for chest tightness, shortness of breath and wheezing.   Cardiovascular: Negative for chest pain and palpitations.  Gastrointestinal: Negative for abdominal pain, nausea and vomiting.  Neurological: Negative.     Social History   Tobacco Use  . Smoking status: Never Smoker  . Smokeless tobacco: Current User    Types: Chew  . Tobacco comment: chew tobacco - 1 pack last 3  weeks  Substance Use Topics  . Alcohol use: No    Alcohol/week: 0.0 standard drinks      Objective:   BP (!) 178/75 (BP Location: Left Arm, Patient Position: Sitting, Cuff Size: Large)   Pulse (!) 54   Temp 97.9 F (36.6 C) (Oral)   Resp 16   Ht _0  (1.702 m)   Wt 175 lb (79.4 kg)   SpO2 95%   BMI 27.41 kg/m  Vitals:   01/10/19 0818  BP: (!) 178/75  Pulse: (!) 54  Resp: 16  Temp: 97.9 F (36.6 C)  TempSrc: Oral  SpO2: 95%  Weight: 175 lb (79.4 kg)  Height: _1  (1.702 m)     Physical Exam Vitals signs reviewed.  Constitutional:      General: He is not in acute distress.    Appearance: Normal appearance. He is well-developed and  normal weight. He is not ill-appearing or diaphoretic.  HENT:     Head: Normocephalic and atraumatic.  Eyes:     General: No scleral icterus.    Extraocular Movements: Extraocular movements intact.  Neck:     Musculoskeletal: Normal range of motion and neck supple.     Thyroid: No thyromegaly.     Vascular: No JVD.     Trachea: No tracheal deviation.  Cardiovascular:     Rate and Rhythm: Normal rate and regular rhythm.     Pulses: Normal pulses.     Heart sounds: Normal heart sounds. No murmur. No friction rub. No gallop.   Pulmonary:     Effort: Pulmonary effort is normal. No respiratory distress.     Breath sounds: Normal breath sounds. No wheezing or rales.  Musculoskeletal:     Right lower leg: No edema.     Left lower leg: No edema.  Lymphadenopathy:     Cervical: No cervical adenopathy.  Skin:    Capillary Refill: Capillary refill takes less than 2 seconds.  Neurological:     Mental Status: He is alert.  Psychiatric:        Mood and Affect: Mood normal.        Behavior: Behavior normal.        Thought Content: Thought content normal.        Judgment: Judgment normal.        Assessment & Plan    1. Type 2 diabetes mellitus with complication, without long-term current use of insulin (HCC) A1c improved to 6.7. Continue lifestyle modifications. Continue glipizide xl 56m daily, metformin 10017mBID and Onglyza 61m66maily.  - POCT glycosylated hemoglobin (Hb A1C)  2. Essential hypertension Elevated today. Advised to check BP at home and call if > 140/90. If so will increase lisinopril from 63m19m 40mg34m   JenniMar DaringC  [facility]

## 2019-01-10 ENCOUNTER — Ambulatory Visit: Payer: Medicare Other | Admitting: Physician Assistant

## 2019-01-10 ENCOUNTER — Encounter: Payer: Self-pay | Admitting: Physician Assistant

## 2019-01-10 ENCOUNTER — Other Ambulatory Visit: Payer: Self-pay

## 2019-01-10 VITALS — BP 178/75 | HR 54 | Temp 97.9°F | Resp 16 | Ht 67.0 in | Wt 175.0 lb

## 2019-01-10 DIAGNOSIS — I10 Essential (primary) hypertension: Secondary | ICD-10-CM

## 2019-01-10 DIAGNOSIS — E118 Type 2 diabetes mellitus with unspecified complications: Secondary | ICD-10-CM

## 2019-01-10 LAB — POCT GLYCOSYLATED HEMOGLOBIN (HGB A1C)
Est. average glucose Bld gHb Est-mCnc: 146
Hemoglobin A1C: 6.7 % — AB (ref 4.0–5.6)

## 2019-01-10 NOTE — Patient Instructions (Signed)
Today BP was 178/75 Please check BP at home and if running higher than 140s on top or higher than 90s on the bottom please call the office.  DASH Eating Plan DASH stands for "Dietary Approaches to Stop Hypertension." The DASH eating plan is a healthy eating plan that has been shown to reduce high blood pressure (hypertension). It may also reduce your risk for type 2 diabetes, heart disease, and stroke. The DASH eating plan may also help with weight loss. What are tips for following this plan?  General guidelines  Avoid eating more than 2,300 mg (milligrams) of salt (sodium) a day. If you have hypertension, you may need to reduce your sodium intake to 1,500 mg a day.  Limit alcohol intake to no more than 1 drink a day for nonpregnant women and 2 drinks a day for men. One drink equals 12 oz of beer, 5 oz of wine, or 1 oz of hard liquor.  Work with your health care provider to maintain a healthy body weight or to lose weight. Ask what an ideal weight is for you.  Get at least 30 minutes of exercise that causes your heart to beat faster (aerobic exercise) most days of the week. Activities may include walking, swimming, or biking.  Work with your health care provider or diet and nutrition specialist (dietitian) to adjust your eating plan to your individual calorie needs. Reading food labels   Check food labels for the amount of sodium per serving. Choose foods with less than 5 percent of the Daily Value of sodium. Generally, foods with less than 300 mg of sodium per serving fit into this eating plan.  To find whole grains, look for the word "whole" as the first word in the ingredient list. Shopping  Buy products labeled as "low-sodium" or "no salt added."  Buy fresh foods. Avoid canned foods and premade or frozen meals. Cooking  Avoid adding salt when cooking. Use salt-free seasonings or herbs instead of table salt or sea salt. Check with your health care provider or pharmacist before  using salt substitutes.  Do not fry foods. Cook foods using healthy methods such as baking, boiling, grilling, and broiling instead.  Cook with heart-healthy oils, such as olive, canola, soybean, or sunflower oil. Meal planning  Eat a balanced diet that includes: ? 5 or more servings of fruits and vegetables each day. At each meal, try to fill half of your plate with fruits and vegetables. ? Up to 6-8 servings of whole grains each day. ? Less than 6 oz of lean meat, poultry, or fish each day. A 3-oz serving of meat is about the same size as a deck of cards. One egg equals 1 oz. ? 2 servings of low-fat dairy each day. ? A serving of nuts, seeds, or beans 5 times each week. ? Heart-healthy fats. Healthy fats called Omega-3 fatty acids are found in foods such as flaxseeds and coldwater fish, like sardines, salmon, and mackerel.  Limit how much you eat of the following: ? Canned or prepackaged foods. ? Food that is high in trans fat, such as fried foods. ? Food that is high in saturated fat, such as fatty meat. ? Sweets, desserts, sugary drinks, and other foods with added sugar. ? Full-fat dairy products.  Do not salt foods before eating.  Try to eat at least 2 vegetarian meals each week.  Eat more home-cooked food and less restaurant, buffet, and fast food.  When eating at a restaurant, ask that your  food be prepared with less salt or no salt, if possible. What foods are recommended? The items listed may not be a complete list. Talk with your dietitian about what dietary choices are best for you. Grains Whole-grain or whole-wheat bread. Whole-grain or whole-wheat pasta. Brown rice. [place]. Bulgur. Whole-grain and low-sodium cereals. Pita bread. Low-fat, low-sodium crackers. Whole-wheat flour tortillas. Vegetables Fresh or frozen vegetables (raw, steamed, roasted, or grilled). Low-sodium or reduced-sodium tomato and vegetable juice. Low-sodium or reduced-sodium tomato sauce and  tomato paste. Low-sodium or reduced-sodium canned vegetables. Fruits All fresh, dried, or frozen fruit. Canned fruit in natural juice (without added sugar). Meat and other protein foods Skinless chicken or [place]. Ground chicken or [place]. Pork with fat trimmed off. Fish and seafood. Egg whites. Dried beans, peas, or lentils. Unsalted nuts, nut butters, and seeds. Unsalted canned beans. Lean cuts of beef with fat trimmed off. Low-sodium, lean deli meat. Dairy Low-fat (1%) or fat-free (skim) milk. Fat-free, low-fat, or reduced-fat cheeses. Nonfat, low-sodium ricotta or cottage cheese. Low-fat or nonfat yogurt. Low-fat, low-sodium cheese. Fats and oils Soft margarine without trans fats. Vegetable oil. Low-fat, reduced-fat, or light mayonnaise and salad dressings (reduced-sodium). Canola, safflower, olive, soybean, and sunflower oils. Avocado. Seasoning and other foods Herbs. Spices. Seasoning mixes without salt. Unsalted popcorn and pretzels. Fat-free sweets. What foods are not recommended? The items listed may not be a complete list. Talk with your dietitian about what dietary choices are best for you. Grains Baked goods made with fat, such as croissants, muffins, or some breads. Dry pasta or rice meal packs. Vegetables Creamed or fried vegetables. Vegetables in a cheese sauce. Regular canned vegetables (not low-sodium or reduced-sodium). Regular canned tomato sauce and paste (not low-sodium or reduced-sodium). Regular tomato and vegetable juice (not low-sodium or reduced-sodium). Angie Fava. Olives. Fruits Canned fruit in a light or heavy syrup. Fried fruit. Fruit in cream or butter sauce. Meat and other protein foods Fatty cuts of meat. Ribs. Fried meat. Berniece Salines. Sausage. Bologna and other processed lunch meats. Salami. Fatback. Hotdogs. Bratwurst. Salted nuts and seeds. Canned beans with added salt. Canned or smoked fish. Whole eggs or egg yolks. Chicken or [place] with skin. Dairy Whole or 2%  milk, cream, and half-and-half. Whole or full-fat cream cheese. Whole-fat or sweetened yogurt. Full-fat cheese. Nondairy creamers. Whipped toppings. Processed cheese and cheese spreads. Fats and oils Butter. Stick margarine. Lard. Shortening. Ghee. Bacon fat. Tropical oils, such as coconut, palm kernel, or palm oil. Seasoning and other foods Salted popcorn and pretzels. Onion salt, garlic salt, seasoned salt, table salt, and sea salt. Worcestershire sauce. Tartar sauce. Barbecue sauce. Teriyaki sauce. Soy sauce, including reduced-sodium. Steak sauce. Canned and packaged gravies. Fish sauce. Oyster sauce. Cocktail sauce. Horseradish that you find on the shelf. Ketchup. Mustard. Meat flavorings and tenderizers. Bouillon cubes. Hot sauce and Tabasco sauce. Premade or packaged marinades. Premade or packaged taco seasonings. Relishes. Regular salad dressings. Where to find more information:  National Heart, Lung, and [place]: https://wilson-eaton.com/  American Heart Association: www.heart.org Summary  The DASH eating plan is a healthy eating plan that has been shown to reduce high blood pressure (hypertension). It may also reduce your risk for type 2 diabetes, heart disease, and stroke.  With the DASH eating plan, you should limit salt (sodium) intake to 2,300 mg a day. If you have hypertension, you may need to reduce your sodium intake to 1,500 mg a day.  When on the DASH eating plan, aim to eat more fresh fruits and vegetables,  whole grains, lean proteins, low-fat dairy, and heart-healthy fats.  Work with your health care provider or diet and nutrition specialist (dietitian) to adjust your eating plan to your individual calorie needs. This information is not intended to replace advice given to you by your health care provider. Make sure you discuss any questions you have with your health care provider. Document Released: 07/03/2011 Document Revised: 07/07/2016 Document Reviewed: 07/07/2016  Elsevier Interactive Patient Education  2019 [employer].

## 2019-02-07 ENCOUNTER — Other Ambulatory Visit: Payer: Self-pay | Admitting: Physician Assistant

## 2019-02-07 DIAGNOSIS — G629 Polyneuropathy, unspecified: Secondary | ICD-10-CM

## 2019-02-07 DIAGNOSIS — I482 Chronic atrial fibrillation, unspecified: Secondary | ICD-10-CM

## 2019-02-21 ENCOUNTER — Other Ambulatory Visit: Payer: Self-pay | Admitting: Physician Assistant

## 2019-02-21 ENCOUNTER — Other Ambulatory Visit: Payer: Self-pay | Admitting: Family Medicine

## 2019-02-21 DIAGNOSIS — E119 Type 2 diabetes mellitus without complications: Secondary | ICD-10-CM

## 2019-02-21 NOTE — Telephone Encounter (Signed)
L.O.V. was 01/10/2019. 

## 2019-03-31 ENCOUNTER — Other Ambulatory Visit: Payer: Self-pay | Admitting: Physician Assistant

## 2019-03-31 DIAGNOSIS — I10 Essential (primary) hypertension: Secondary | ICD-10-CM

## 2019-04-08 ENCOUNTER — Encounter: Payer: Self-pay | Admitting: Physician Assistant

## 2019-04-19 ENCOUNTER — Other Ambulatory Visit: Payer: Self-pay | Admitting: Physician Assistant

## 2019-04-19 DIAGNOSIS — R600 Localized edema: Secondary | ICD-10-CM

## 2019-04-19 DIAGNOSIS — M2550 Pain in unspecified joint: Secondary | ICD-10-CM

## 2019-04-21 NOTE — Progress Notes (Signed)
Patient: [name]., Male    DOB: 11/19/1939, 78 y.o.   MRN: [id number] Visit Date: 04/22/2019  Today's Provider: Mar Daring, [name]   Chief Complaint  Patient presents with  . Annual Exam   Subjective:     Annual wellness visit [name] Sr. is a 78 y.o. male. He feels well. He reports exercising regularly. He reports he is sleeping well.  He does report that one of his son's his going to take him on a road trip out [place]. They plan to go to [place] and [place]. He is very excited to go.  -----------------------------------------------------------   Review of Systems  Constitutional: Negative.   HENT: Negative.   Eyes: Negative.   Respiratory: Negative.   Cardiovascular: Negative.   Gastrointestinal: Negative.   Endocrine: Negative.   Genitourinary: Negative.   Musculoskeletal: Negative.   Skin: Negative.   Allergic/Immunologic: Negative.   Neurological: Negative.   Hematological: Negative.   Psychiatric/Behavioral: Negative.     Social History   Socioeconomic History  . Marital status: Widowed    Spouse name: Not on file  . Number of children: 3  . Years of education: Not on file  . Highest education level: 8th grade  Occupational History  . Not on file  Social Needs  . Financial resource strain: Not hard at all  . Food insecurity    Worry: Never true    Inability: Never true  . Transportation needs    Medical: No    Non-medical: No  Tobacco Use  . Smoking status: Never Smoker  . Smokeless tobacco: Current User    Types: Chew  . Tobacco comment: chew tobacco - 1 pack last 3 weeks  Substance and Sexual Activity  . Alcohol use: No    Alcohol/week: 0.0 standard drinks  . Drug use: No  . Sexual activity: Not Currently  Lifestyle  . Physical activity    Days per week: Not on file    Minutes per session: Not on file  . Stress: Not at all  Relationships  . Social Herbalist on phone: Not on file     Gets together: Not on file    Attends religious service: Not on file    Active member of club or organization: Not on file    Attends meetings of clubs or organizations: Not on file    Relationship status: Widowed  . Intimate partner violence    Fear of current or ex partner: Not on file    Emotionally abused: Not on file    Physically abused: Not on file    Forced sexual activity: Not on file  Other Topics Concern  . Not on file  Social History Narrative  . Not on file    Past Medical History:  Diagnosis Date  . Arthritis   . Back pain    hx  . CAD (coronary artery disease)    a. 2004 s/p CABG x 4 (LIMA->LAD, VG->Diag, VG->OM, VG->RCA); b. 2007 Cath: VG->Diag occluded, otw 3/4 patent grafts.  . CKD (chronic kidney disease), stage II-III   . Colon polyps   . DM2 (diabetes mellitus, type 2) (HCC)    stable  . GERD (gastroesophageal reflux disease)   . Glaucoma   . Gout   . HLD (hyperlipidemia)   . HTN (hypertension)   . Neuropathy      Patient Active Problem List   Diagnosis Date Noted  . Wrist arthritis  09/15/2018  . Primary osteoarthritis of both knees 01/20/2018  . CAD (coronary artery disease), native coronary artery 08/04/2017  . Acute pain of left knee 06/12/2017  . Sebaceous cyst 09/10/2016  . Neuropathy 05/22/2016  . Abnormal EKG 11/26/2012  . Type 2 diabetes mellitus with complication, without long-term current use of insulin ([facility]) 11/07/2009  . Mixed hyperlipidemia 11/07/2009  . GOUT, UNSPECIFIED 11/07/2009  . Essential hypertension 11/07/2009  . UNSTABLE ANGINA 11/07/2009  . 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 GERD 11/07/2009  . Backache 11/07/2009    Past Surgical History:  Procedure Laterality Date  . BACK SURGERY     Cyst removed  . CORONARY ARTERY BYPASS GRAFT  2004  . KNEE SURGERY      His Family history is unknown by patient.   Current Outpatient Medications:  .  ACCU-CHEK AVIVA PLUS test strip, USE AS  DIRECTED TO CHECK FASTING BLOOD SUGAR EVERY MORNING, Disp: 100 each, Rfl: 4 .  allopurinol (ZYLOPRIM) 300 MG tablet, TAKE 1 TABLET BY MOUTH ONCE A DAY, Disp: 90 tablet, Rfl: 1 .  aspirin 81 MG chewable tablet, Chew 81 mg by mouth daily., Disp: , Rfl:  .  atorvastatin (LIPITOR) 20 MG tablet, TAKE 1 TABLET BY MOUTH ONCE DAILY, Disp: 90 tablet, Rfl: 3 .  BD SHARPS CONTAINER HOME MISC, Dispose of syringes after B12 injections, Disp: 1 each, Rfl: 5 .  Blood Glucose Monitoring Suppl (ACCU-CHEK AVIVA PLUS) w/Device KIT, To check blood sugar once daily, Disp: 1 kit, Rfl: 0 .  digoxin (LANOXIN) 0.125 MG tablet, TAKE 1 TABLET BY MOUTH ONCE DAILY, Disp: 90 tablet, Rfl: 1 .  furosemide (LASIX) 20 MG tablet, TAKE 1 TABLET BY MOUTH ONCE DAILY, Disp: 90 tablet, Rfl: 3 .  gabapentin (NEURONTIN) 400 MG capsule, TAKE 1 CAPSULE BY MOUTH 3 TIMES DAILY, Disp: 90 capsule, Rfl: 5 .  glipiZIDE (GLUCOTROL XL) 10 MG 24 hr tablet, TAKE 1 TABLET BY MOUTH ONCE A DAY WITH BREAKFAST, Disp: 90 tablet, Rfl: 3 .  lisinopril (ZESTRIL) 20 MG tablet, TAKE 1 TABLET BY MOUTH ONCE DAILY, Disp: 90 tablet, Rfl: 3 .  meloxicam (MOBIC) 7.5 MG tablet, TAKE 1 TABLET BY MOUTH TWICE A DAY, Disp: 180 tablet, Rfl: 1 .  metFORMIN (GLUCOPHAGE) 1000 MG tablet, TAKE 1 TABLET BY MOUTH 2 TIMES DAILY WITH A MEAL, Disp: 180 tablet, Rfl: 1 .  nitroGLYCERIN (NITROSTAT) 0.4 MG SL tablet, Place 1 tablet (0.4 mg total) under the tongue every 5 (five) minutes as needed for chest pain., Disp: 25 tablet, Rfl: 6 .  ONGLYZA 5 MG TABS tablet, TAKE 1 TABLET BY MOUTH ONCE DAILY, Disp: 90 tablet, Rfl: 1 .  potassium chloride (K-DUR) 10 MEQ tablet, TAKE 1 TABLET BY MOUTH ONCE DAILY, Disp: 90 tablet, Rfl: 1 .  SYRINGE-NEEDLE, DISP, 3 ML (B-D SYRINGE/NEEDLE 3CC/25GX5/8) 25G X 5/8" 3 ML MISC, For use with cyanocobalamin injections, Disp: 50 each, Rfl: 0 .  tamsulosin (FLOMAX) 0.4 MG CAPS capsule, TAKE 1 CAPSULE BY MOUTH ONCE DAILY, Disp: 90 capsule, Rfl: 1  Patient Care  Team: Mar Daring, [name] as PCP - General (Family Medicine) Rockey Situ, Kathlene November, MD as PCP - Cardiology (Cardiology) Minna Merritts, MD as Consulting Physician (Cardiology) Robert Bellow, MD (General Surgery)    Objective:    Vitals: BP (!) 185/80 (BP Location: Left Arm, Patient Position: Sitting, Cuff Size: Normal)   Pulse (!) 58   Temp (!) 97.1 F (36.2 C) (Temporal)   Ht 5'  10" (1.778 m)   Wt 181 lb (82.1 kg)   BMI 25.97 kg/m   Physical Exam Constitutional:      Appearance: Normal appearance. He is well-developed and normal weight.  HENT:     Head: Normocephalic and atraumatic.     Right Ear: Tympanic membrane, ear canal and external ear normal.     Left Ear: Tympanic membrane, ear canal and external ear normal.     Nose: Nose normal.     Mouth/Throat:     Mouth: Mucous membranes are moist.  Eyes:     General:        Right eye: No discharge.     Extraocular Movements: Extraocular movements intact.     Conjunctiva/sclera: Conjunctivae normal.     Pupils: Pupils are equal, round, and reactive to light.  Neck:     Musculoskeletal: Normal range of motion and neck supple.     Thyroid: No thyromegaly.     Vascular: No carotid bruit.     Trachea: No tracheal deviation.  Cardiovascular:     Rate and Rhythm: Normal rate and regular rhythm.     Pulses: Normal pulses.     Heart sounds: Normal heart sounds. No murmur.  Pulmonary:     Effort: Pulmonary effort is normal. No respiratory distress.     Breath sounds: Normal breath sounds. No wheezing or rales.  Chest:     Chest wall: No tenderness.  Abdominal:     General: Abdomen is flat. There is no distension.     Palpations: Abdomen is soft. There is no mass.     Tenderness: There is no abdominal tenderness. There is no guarding or rebound.  Musculoskeletal: Normal range of motion.        General: No tenderness.     Right lower leg: No edema.     Left lower leg: No edema.  Lymphadenopathy:     Cervical: No  cervical adenopathy.  Skin:    General: Skin is warm and dry.     Capillary Refill: Capillary refill takes less than 2 seconds.     Findings: No erythema or rash.  Neurological:     General: No focal deficit present.     Mental Status: He is alert and oriented to person, place, and time. Mental status is at baseline.     Cranial Nerves: No cranial nerve deficit.     Motor: No abnormal muscle tone.     Coordination: Coordination normal.     Deep Tendon Reflexes: Reflexes are normal and symmetric. Reflexes normal.  Psychiatric:        Mood and Affect: Mood normal.        Behavior: Behavior normal.        Thought Content: Thought content normal.        Judgment: Judgment normal.     Activities of Daily Living In your present state of health, do you have any difficulty performing the following activities: 04/22/2019  Hearing? Y  Vision? Y  Difficulty concentrating or making decisions? N  Walking or climbing stairs? N  Dressing or bathing? N  Doing errands, shopping? N  Some recent data might be hidden    Fall Risk Assessment Fall Risk  04/22/2019 03/18/2018 03/17/2017 03/13/2016  Falls in the past year? 1 No No No  Number falls in past yr: 0 - - -  Injury with Fall? 0 - - -  Follow up Falls evaluation completed - - -     Depression Screen  PHQ 2/9 Scores 04/22/2019 03/18/2018 03/17/2017 03/13/2016  PHQ - 2 Score 0 0 0 0  PHQ- 9 Score 1 - - -    6CIT Screen 04/22/2019  What Year? 0 points  What month? 0 points  What time? 0 points  Count back from 20 0 points  Months in reverse 4 points  Repeat phrase 2 points  Total Score 6   Patient score slightly higher due to lack of education (not knowing months) and inability to read    Assessment & Plan:     Annual Wellness Visit  Reviewed patient's Family Medical History Reviewed and updated list of patient's medical providers Assessment of cognitive impairment was done Assessed patient's functional ability Established a  written schedule for health screening [place] Completed and Reviewed  Exercise Activities and Dietary recommendations Goals    . Increase water intake     Recommend increasing water intake to 6-8 glasses a day.     . Quit Chewing Tobacco     Recommend to continue decreasing amount of tobacco chewed then completely quitting.        Immunization History  Administered Date(s) Administered  . Influenza, High Dose Seasonal PF 03/17/2017, 04/21/2018  . Influenza,inj,Quad PF,6+ Mos 05/05/2016  . Pneumococcal Conjugate-13 03/13/2016  . Pneumococcal Polysaccharide-23 03/17/2017    Health Maintenance  Topic Date Due  . OPHTHALMOLOGY EXAM  11/20/1949  . INFLUENZA VACCINE  02/26/2019  . FOOT EXAM  04/22/2019  . TETANUS/TDAP  04/22/2019 (Originally 11/20/1958)  . HEMOGLOBIN A1C  07/12/2019  . PNA vac Low Risk Adult  Completed     Discussed health benefits of physical activity, and encouraged him to engage in regular exercise appropriate for his age and condition.    1. Medicare annual wellness visit, subsequent Normal exam today. Up to date on screenings and vaccinations.   2. Essential hypertension Stable. Continue Lisinopril 47m, furosemide 262m Will check labs as below and f/u pending results. - CBC w/Diff/Platelet - Comprehensive Metabolic Panel (CMET) - Lipid Profile - HgB A1c  3. Type 2 diabetes mellitus with stage 2 chronic kidney disease, without long-term current use of insulin (HCC) Stable. Continue glipizide XL 1068mmetformin 1000m62mD, onglyza 5mg.67mll check labs as below and f/u pending results. - CBC w/Diff/Platelet - Comprehensive Metabolic Panel (CMET) - Lipid Profile - HgB A1c  4. CKD (chronic kidney disease), stage II Stable. Continue lisinopril. Will check labs as below and f/u pending results. - CBC w/Diff/Platelet - Comprehensive Metabolic Panel (CMET) - Lipid Profile - HgB A1c  5. Erectile dysfunction due to arterial  insufficiency Patient has a new GF (wife passed about 2-3 years ago). Noticing issues with maintaining erection. States he will not be having intercourse often but if he decides he would like to he would like to have something to help. Discussed side effects and warnings and decided we will start with lowest dose and may increase depending on efficacy and side effects. He agrees.  - sildenafil (VIAGRA) 25 MG tablet; Take 1 tablet (25 mg total) by mouth daily as needed for erectile dysfunction.  Dispense: 10 tablet; Refill: 0  6. Need for influenza vaccination Flu vaccine given today without complication. Patient sat upright for 15 minutes to check for adverse reaction before being released. - Flu Vaccine QUAD High Dose(Fluad)  ------------------------------------------------------------------------------------------------------------    JenniMar DaringC  [facility]

## 2019-04-22 ENCOUNTER — Ambulatory Visit (INDEPENDENT_AMBULATORY_CARE_PROVIDER_SITE_OTHER): Payer: Medicare Other | Admitting: Physician Assistant

## 2019-04-22 ENCOUNTER — Other Ambulatory Visit: Payer: Self-pay

## 2019-04-22 ENCOUNTER — Encounter: Payer: Self-pay | Admitting: Physician Assistant

## 2019-04-22 VITALS — BP 185/80 | HR 58 | Temp 97.1°F | Ht 70.0 in | Wt 181.0 lb

## 2019-04-22 DIAGNOSIS — N5201 Erectile dysfunction due to arterial insufficiency: Secondary | ICD-10-CM | POA: Diagnosis not present

## 2019-04-22 DIAGNOSIS — I10 Essential (primary) hypertension: Secondary | ICD-10-CM | POA: Diagnosis not present

## 2019-04-22 DIAGNOSIS — N182 Chronic kidney disease, stage 2 (mild): Secondary | ICD-10-CM | POA: Insufficient documentation

## 2019-04-22 DIAGNOSIS — Z23 Encounter for immunization: Secondary | ICD-10-CM

## 2019-04-22 DIAGNOSIS — Z0000 Encounter for general adult medical examination without abnormal findings: Secondary | ICD-10-CM

## 2019-04-22 DIAGNOSIS — E1122 Type 2 diabetes mellitus with diabetic chronic kidney disease: Secondary | ICD-10-CM

## 2019-04-22 DIAGNOSIS — N1831 Chronic kidney disease, stage 3a: Secondary | ICD-10-CM | POA: Insufficient documentation

## 2019-04-22 MED ORDER — SILDENAFIL CITRATE 25 MG PO TABS: 25.0000 mg | ORAL_TABLET | Freq: Every day | ORAL | 0 refills | Status: DC | PRN

## 2019-04-22 NOTE — Patient Instructions (Signed)
Health Maintenance After Age 77 After age 77, you are at a higher risk for certain long-term diseases and infections as well as injuries from falls. Falls are a major cause of broken bones and head injuries in people who are older than age 77. Getting regular preventive care can help to keep you healthy and well. Preventive care includes getting regular testing and making lifestyle changes as recommended by your health care provider. Talk with your health care provider about:  Which screenings and tests you should have. A screening is a test that checks for a disease when you have no symptoms.  A diet and exercise plan that is right for you. What should I know about screenings and tests to prevent falls? Screening and testing are the best ways to find a health problem early. Early diagnosis and treatment give you the best chance of managing medical conditions that are common after age 77. Certain conditions and lifestyle choices may make you more likely to have a fall. Your health care provider may recommend:  Regular vision checks. Poor vision and conditions such as cataracts can make you more likely to have a fall. If you wear glasses, make sure to get your prescription updated if your vision changes.  Medicine review. Work with your health care provider to regularly review all of the medicines you are taking, including over-the-counter medicines. Ask your health care provider about any side effects that may make you more likely to have a fall. Tell your health care provider if any medicines that you take make you feel dizzy or sleepy.  Osteoporosis screening. Osteoporosis is a condition that causes the bones to get weaker. This can make the bones weak and cause them to break more easily.  Blood pressure screening. Blood pressure changes and medicines to control blood pressure can make you feel dizzy.  Strength and balance checks. Your health care provider may recommend certain tests to check your  strength and balance while standing, walking, or changing positions.  Foot health exam. Foot pain and numbness, as well as not wearing proper footwear, can make you more likely to have a fall.  Depression screening. You may be more likely to have a fall if you have a fear of falling, feel emotionally low, or feel unable to do activities that you used to do.  Alcohol use screening. Using too much alcohol can affect your balance and may make you more likely to have a fall. What actions can I take to lower my risk of falls? General instructions  Talk with your health care provider about your risks for falling. Tell your health care provider if: ? You fall. Be sure to tell your health care provider about all falls, even ones that seem minor. ? You feel dizzy, sleepy, or off-balance.  Take over-the-counter and prescription medicines only as told by your health care provider. These include any supplements.  Eat a healthy diet and maintain a healthy weight. A healthy diet includes low-fat dairy products, low-fat (lean) meats, and fiber from whole grains, beans, and lots of fruits and vegetables. Home safety  Remove any tripping hazards, such as rugs, cords, and clutter.  Install safety equipment such as grab bars in bathrooms and safety rails on stairs.  Keep rooms and walkways well-lit. Activity   Follow a regular exercise program to stay fit. This will help you maintain your balance. Ask your health care provider what types of exercise are appropriate for you.  If you need a cane or   walker, use it as recommended by your health care provider.  Wear supportive shoes that have nonskid soles. Lifestyle  Do not drink alcohol if your health care provider tells you not to drink.  If you drink alcohol, limit how much you have: ? 0-1 drink a day for women. ? 0-2 drinks a day for men.  Be aware of how much alcohol is in your drink. In the [place] one drink equals one typical bottle of beer (12  oz), one-half glass of wine (5 oz), or one shot of hard liquor (1 oz).  Do not use any products that contain nicotine or tobacco, such as cigarettes and e-cigarettes. If you need help quitting, ask your health care provider. Summary  Having a healthy lifestyle and getting preventive care can help to protect your health and wellness after age 77.  Screening and testing are the best way to find a health problem early and help you avoid having a fall. Early diagnosis and treatment give you the best chance for managing medical conditions that are more common for people who are older than age 77.  Falls are a major cause of broken bones and head injuries in people who are older than age 77. Take precautions to prevent a fall at home.  Work with your health care provider to learn what changes you can make to improve your health and wellness and to prevent falls. This information is not intended to replace advice given to you by your health care provider. Make sure you discuss any questions you have with your health care provider. Document Released: 05/27/2017 Document Revised: 11/04/2018 Document Reviewed: 05/27/2017 Elsevier Patient Education  2020 [employer]  

## 2019-04-23 LAB — COMPREHENSIVE METABOLIC PANEL
ALT: 22 IU/L (ref 0–44)
AST: 16 IU/L (ref 0–40)
Albumin/Globulin Ratio: 2.4 — ABNORMAL HIGH (ref 1.2–2.2)
Albumin: 4.3 g/dL (ref 3.7–4.7)
Alkaline Phosphatase: 60 IU/L (ref 39–117)
BUN/Creatinine Ratio: 8 — ABNORMAL LOW (ref 10–24)
BUN: 8 mg/dL (ref 8–27)
Bilirubin Total: 0.3 mg/dL (ref 0.0–1.2)
CO2: 25 mmol/L (ref 20–29)
Calcium: 9.2 mg/dL (ref 8.6–10.2)
Chloride: 102 mmol/L (ref 96–106)
Creatinine, Ser: 1.02 mg/dL (ref 0.76–1.27)
GFR calc Af Amer: 81 mL/min/{1.73_m2} (ref >59)
GFR calc non Af Amer: 70 mL/min/{1.73_m2} (ref >59)
Globulin, Total: 1.8 g/dL (ref 1.5–4.5)
Glucose: 182 mg/dL — ABNORMAL HIGH (ref 65–99)
Potassium: 4.6 mmol/L (ref 3.5–5.2)
Sodium: 140 mmol/L (ref 134–144)
Total Protein: 6.1 g/dL (ref 6.0–8.5)

## 2019-04-23 LAB — CBC WITH DIFFERENTIAL/PLATELET
Basophils Absolute: 0.1 10*3/uL (ref 0.0–0.2)
Basos: 1 %
EOS (ABSOLUTE): 0.1 10*3/uL (ref 0.0–0.4)
Eos: 2 %
Hematocrit: 36.1 % — ABNORMAL LOW (ref 37.5–51.0)
Hemoglobin: 12 g/dL — ABNORMAL LOW (ref 13.0–17.7)
Immature Grans (Abs): 0 10*3/uL (ref 0.0–0.1)
Immature Granulocytes: 0 %
Lymphocytes Absolute: 1.9 10*3/uL (ref 0.7–3.1)
Lymphs: 27 %
MCH: 29.1 pg (ref 26.6–33.0)
MCHC: 33.2 g/dL (ref 31.5–35.7)
MCV: 88 fL (ref 79–97)
Monocytes Absolute: 0.4 10*3/uL (ref 0.1–0.9)
Monocytes: 6 %
Neutrophils Absolute: 4.4 10*3/uL (ref 1.4–7.0)
Neutrophils: 64 %
Platelets: 206 10*3/uL (ref 150–450)
RBC: 4.12 x10E6/uL — ABNORMAL LOW (ref 4.14–5.80)
RDW: 11.9 % (ref 11.6–15.4)
WBC: 6.9 10*3/uL (ref 3.4–10.8)

## 2019-04-23 LAB — LIPID PANEL
Chol/HDL Ratio: 3.1 ratio (ref 0.0–5.0)
Cholesterol, Total: 119 mg/dL (ref 100–199)
HDL: 38 mg/dL — ABNORMAL LOW (ref >39)
LDL Chol Calc (NIH): 69 mg/dL (ref 0–99)
Triglycerides: 52 mg/dL (ref 0–149)
VLDL Cholesterol Cal: 12 mg/dL (ref 5–40)

## 2019-04-23 LAB — HEMOGLOBIN A1C
Est. average glucose Bld gHb Est-mCnc: 177 mg/dL
Hgb A1c MFr Bld: 7.8 % — ABNORMAL HIGH (ref 4.8–5.6)

## 2019-04-27 ENCOUNTER — Encounter: Payer: Medicare Other | Admitting: Physician Assistant

## 2019-04-27 ENCOUNTER — Telehealth: Payer: Self-pay

## 2019-04-27 NOTE — Telephone Encounter (Signed)
Pt reports he is taking his medications regularly.  He states he will work on lifestyle changes.   Thanks,   -Mickel Baas

## 2019-04-27 NOTE — Telephone Encounter (Signed)
-----   Message from Mar Daring, [name] sent at 04/27/2019 11:07 AM EDT ----- Hemoglobin stable for you. Kidney and liver function are normal. Sodium, potassium and calcium are normal. Cholesterol is great. A1c is up quite a bit from 6.7 to 7.8. Make sure to take metformin twice daily, glipizide daily and onglyza daily. Really work on lifestyle modifications and limiting carbs/sugars in diet. Will recheck in 3 months. If still elevated may need to add another medication.

## 2019-05-06 ENCOUNTER — Other Ambulatory Visit: Payer: Self-pay | Admitting: Physician Assistant

## 2019-05-06 DIAGNOSIS — R600 Localized edema: Secondary | ICD-10-CM

## 2019-05-30 ENCOUNTER — Other Ambulatory Visit: Payer: Self-pay | Admitting: Physician Assistant

## 2019-05-30 DIAGNOSIS — M1A9XX0 Chronic gout, unspecified, without tophus (tophi): Secondary | ICD-10-CM

## 2019-07-12 ENCOUNTER — Other Ambulatory Visit: Payer: Self-pay | Admitting: Physician Assistant

## 2019-07-12 DIAGNOSIS — E118 Type 2 diabetes mellitus with unspecified complications: Secondary | ICD-10-CM

## 2019-07-20 NOTE — Progress Notes (Addendum)
Patient: [name] Bink Sr. Male    DOB: 11/19/1939   78 y.o.   MRN: [id number] Visit Date: 07/25/2019  Today's Provider: Mar Daring, [name]   Chief Complaint  Patient presents with  . Follow-up    T2DM   Subjective:     HPI   Diabetes Mellitus Type II, Follow-up:   Lab Results  Component Value Date   HGBA1C 7.5 (A) 07/25/2019   HGBA1C 7.8 (H) 04/22/2019   HGBA1C 6.7 (A) 01/10/2019    Last seen for diabetes 3 months ago.  Management since then includes stable. Continue glipizide XL 3m, metformin 10050mBID, onglyza 68m72mHe reports excellent compliance with treatment. He is not having side effects.  Current symptoms include none and have been stable. Home blood sugar records: fasting range: 120  Episodes of hypoglycemia? yes - 40's in the last couple of months.  Most Recent Eye Exam: not UTD Weight trend: increasing steadily Current exercise: walking  Pertinent Labs:    Component Value Date/Time   CHOL 119 04/22/2019 1119   TRIG 52 04/22/2019 1119   TRIG 136 02/28/2008 0000   HDL 38 (L) 04/22/2019 1119   LDLCALC 69 04/22/2019 1119   CREATININE 1.02 04/22/2019 1119   CREATININE 1.43 (H) 06/12/2017 1146    Wt Readings from Last 3 Encounters:  07/25/19 179 lb (81.2 kg)  04/22/19 181 lb (82.1 kg)  01/10/19 175 lb (79.4 kg)    ------------------------------------------------------------------------   Allergies  Allergen Reactions  . Ramipril      Current Outpatient Medications:  .  ACCU-CHEK AVIVA PLUS test strip, USE AS DIRECTED TO CHECK FASTING BLOOD SUGAR EVERY MORNING, Disp: 100 each, Rfl: 4 .  allopurinol (ZYLOPRIM) 300 MG tablet, TAKE 1 TABLET BY MOUTH ONCE DAILY, Disp: 90 tablet, Rfl: 1 .  aspirin 81 MG chewable tablet, Chew 81 mg by mouth daily., Disp: , Rfl:  .  atorvastatin (LIPITOR) 20 MG tablet, TAKE 1 TABLET BY MOUTH ONCE DAILY, Disp: 90 tablet, Rfl: 3 .  BD SHARPS CONTAINER HOME MISC, Dispose of syringes after  B12 injections, Disp: 1 each, Rfl: 5 .  Blood Glucose Monitoring Suppl (ACCU-CHEK AVIVA PLUS) w/Device KIT, To check blood sugar once daily, Disp: 1 kit, Rfl: 0 .  digoxin (LANOXIN) 0.125 MG tablet, TAKE 1 TABLET BY MOUTH ONCE DAILY, Disp: 90 tablet, Rfl: 1 .  furosemide (LASIX) 20 MG tablet, TAKE 1 TABLET BY MOUTH ONCE DAILY, Disp: 90 tablet, Rfl: 3 .  gabapentin (NEURONTIN) 400 MG capsule, TAKE 1 CAPSULE BY MOUTH 3 TIMES DAILY, Disp: 90 capsule, Rfl: 5 .  glipiZIDE (GLUCOTROL XL) 10 MG 24 hr tablet, TAKE 1 TABLET BY MOUTH ONCE DAILY WITH BREAKFAST, Disp: 90 tablet, Rfl: 3 .  lisinopril (ZESTRIL) 20 MG tablet, TAKE 1 TABLET BY MOUTH ONCE DAILY, Disp: 90 tablet, Rfl: 3 .  meloxicam (MOBIC) 7.5 MG tablet, TAKE 1 TABLET BY MOUTH TWICE A DAY, Disp: 180 tablet, Rfl: 1 .  metFORMIN (GLUCOPHAGE) 1000 MG tablet, TAKE 1 TABLET BY MOUTH 2 TIMES DAILY WITH A MEAL, Disp: 180 tablet, Rfl: 1 .  ONGLYZA 5 MG TABS tablet, TAKE 1 TABLET BY MOUTH ONCE DAILY, Disp: 90 tablet, Rfl: 1 .  potassium chloride (KLOR-CON) 10 MEQ tablet, TAKE 1 TABLET BY MOUTH ONCE DAILY, Disp: 90 tablet, Rfl: 1 .  sildenafil (VIAGRA) 25 MG tablet, Take 1 tablet (25 mg total) by mouth daily as needed for erectile dysfunction., Disp: 10 tablet, Rfl: 0 .  tamsulosin (FLOMAX) 0.4 MG CAPS capsule, TAKE 1 CAPSULE BY MOUTH ONCE DAILY, Disp: 90 capsule, Rfl: 1 .  nitroGLYCERIN (NITROSTAT) 0.4 MG SL tablet, Place 1 tablet (0.4 mg total) under the tongue every 5 (five) minutes as needed for chest pain., Disp: 25 tablet, Rfl: 6 .  SYRINGE-NEEDLE, DISP, 3 ML (B-D SYRINGE/NEEDLE 3CC/25GX5/8) 25G X 5/8" 3 ML MISC, For use with cyanocobalamin injections (Patient not taking: Reported on 07/25/2019), Disp: 50 each, Rfl: 0  Review of Systems  Social History   Tobacco Use  . Smoking status: Never Smoker  . Smokeless tobacco: Current User    Types: Chew  . Tobacco comment: chew tobacco - 1 pack last 3 weeks  Substance Use Topics  . Alcohol use: No     Alcohol/week: 0.0 standard drinks      Objective:   BP (!) 177/75 (BP Location: Left Arm, Patient Position: Sitting, Cuff Size: Large)   Pulse 71   Temp (!) 97.1 F (36.2 C) (Temporal)   Resp 16   Wt 179 lb (81.2 kg)   BMI 25.68 kg/m  Vitals:   07/25/19 0804  BP: (!) 177/75  Pulse: 71  Resp: 16  Temp: (!) 97.1 F (36.2 C)  TempSrc: Temporal  Weight: 179 lb (81.2 kg)  Body mass index is 25.68 kg/m.   Physical Exam Vitals reviewed.  Constitutional:      General: He is not in acute distress.    Appearance: Normal appearance. He is well-developed. He is obese. He is not ill-appearing or diaphoretic.  HENT:     Head: Normocephalic and atraumatic.  Cardiovascular:     Rate and Rhythm: Normal rate and regular rhythm.     Pulses: Normal pulses.     Heart sounds: Normal heart sounds. No murmur. No friction rub. No gallop.   Pulmonary:     Effort: Pulmonary effort is normal. No respiratory distress.     Breath sounds: Normal breath sounds. No wheezing or rales.  Musculoskeletal:     Cervical back: Normal range of motion and neck supple.  Neurological:     Mental Status: He is alert.      Results for orders placed or performed in visit on 07/25/19  POCT HgB A1C  Result Value Ref Range   Hemoglobin A1C 7.5 (A) 4.0 - 5.6 %   Est. average glucose Bld gHb Est-mCnc 169        Assessment & Plan    1. Type 2 diabetes mellitus with stage 2 chronic kidney disease, without long-term current use of insulin (HCC) A1c has improved to 7.5. Continue Glipizide XL 53m daily, metformin 10051mBID, and Onglyza 16m416mI will see him back him back in 3 months.   2. Vasculogenic erectile dysfunction, unspecified vasculogenic erectile dysfunction type Failed Viagra 216m516mill try Cialis as below.  - tadalafil (CIALIS) 10 MG tablet; Take 1 tablet (10 mg total) by mouth daily as needed for erectile dysfunction.  Dispense: 10 tablet; Refill: 0     JennMar Daring-C  [facility]

## 2019-07-25 ENCOUNTER — Other Ambulatory Visit: Payer: Self-pay

## 2019-07-25 ENCOUNTER — Ambulatory Visit (INDEPENDENT_AMBULATORY_CARE_PROVIDER_SITE_OTHER): Payer: Medicare Other | Admitting: Physician Assistant

## 2019-07-25 ENCOUNTER — Encounter: Payer: Self-pay | Admitting: Physician Assistant

## 2019-07-25 VITALS — BP 177/75 | HR 71 | Temp 97.1°F | Resp 16 | Wt 179.0 lb

## 2019-07-25 DIAGNOSIS — N182 Chronic kidney disease, stage 2 (mild): Secondary | ICD-10-CM

## 2019-07-25 DIAGNOSIS — E1122 Type 2 diabetes mellitus with diabetic chronic kidney disease: Secondary | ICD-10-CM

## 2019-07-25 DIAGNOSIS — N529 Male erectile dysfunction, unspecified: Secondary | ICD-10-CM

## 2019-07-25 LAB — POCT GLYCOSYLATED HEMOGLOBIN (HGB A1C)
Est. average glucose Bld gHb Est-mCnc: 169
Hemoglobin A1C: 7.5 % — AB (ref 4.0–5.6)

## 2019-07-25 MED ORDER — TADALAFIL 10 MG PO TABS: 10.0000 mg | ORAL_TABLET | Freq: Every day | ORAL | 0 refills | Status: DC | PRN

## 2019-07-25 NOTE — Addendum Note (Signed)
Addended by: Mar Daring on: 07/25/2019 08:42 AM   Modules accepted: Orders

## 2019-07-25 NOTE — Patient Instructions (Signed)

## 2019-08-01 ENCOUNTER — Other Ambulatory Visit: Payer: Self-pay

## 2019-08-01 ENCOUNTER — Encounter: Payer: Self-pay | Admitting: Physician Assistant

## 2019-08-01 ENCOUNTER — Ambulatory Visit (INDEPENDENT_AMBULATORY_CARE_PROVIDER_SITE_OTHER): Payer: Medicare PPO | Admitting: Physician Assistant

## 2019-08-01 ENCOUNTER — Telehealth: Payer: Self-pay | Admitting: Cardiovascular Disease

## 2019-08-01 ENCOUNTER — Other Ambulatory Visit
Admission: RE | Admit: 2019-08-01 | Discharge: 2019-08-01 | Disposition: A | Discharge status: Home / Self Care | Payer: Medicare PPO | Source: Ambulatory Visit | Attending: Cardiovascular Disease | Admitting: Cardiovascular Disease

## 2019-08-01 VITALS — BP 150/62 | HR 69 | Ht 68.0 in | Wt 180.5 lb

## 2019-08-01 DIAGNOSIS — E785 Hyperlipidemia, unspecified: Secondary | ICD-10-CM

## 2019-08-01 DIAGNOSIS — R002 Palpitations: Secondary | ICD-10-CM

## 2019-08-01 DIAGNOSIS — I482 Chronic atrial fibrillation, unspecified: Secondary | ICD-10-CM | POA: Diagnosis present

## 2019-08-01 DIAGNOSIS — R0602 Shortness of breath: Secondary | ICD-10-CM | POA: Diagnosis not present

## 2019-08-01 DIAGNOSIS — I208 Other forms of angina pectoris: Secondary | ICD-10-CM | POA: Diagnosis not present

## 2019-08-01 DIAGNOSIS — N182 Chronic kidney disease, stage 2 (mild): Secondary | ICD-10-CM

## 2019-08-01 DIAGNOSIS — I10 Essential (primary) hypertension: Secondary | ICD-10-CM

## 2019-08-01 DIAGNOSIS — G629 Polyneuropathy, unspecified: Secondary | ICD-10-CM

## 2019-08-01 DIAGNOSIS — R0609 Other forms of dyspnea: Secondary | ICD-10-CM

## 2019-08-01 DIAGNOSIS — I5033 Acute on chronic diastolic (congestive) heart failure: Secondary | ICD-10-CM

## 2019-08-01 DIAGNOSIS — I25118 Atherosclerotic heart disease of native coronary artery with other forms of angina pectoris: Secondary | ICD-10-CM

## 2019-08-01 DIAGNOSIS — R0600 Dyspnea, unspecified: Secondary | ICD-10-CM

## 2019-08-01 DIAGNOSIS — E118 Type 2 diabetes mellitus with unspecified complications: Secondary | ICD-10-CM

## 2019-08-01 LAB — BASIC METABOLIC PANEL
Anion gap: 11 (ref 5–15)
BUN: 12 mg/dL (ref 8–23)
CO2: 26 mmol/L (ref 22–32)
Calcium: 9.4 mg/dL (ref 8.9–10.3)
Chloride: 104 mmol/L (ref 98–111)
Creatinine, Ser: 1.15 mg/dL (ref 0.61–1.24)
GFR calc Af Amer: >60 mL/min (ref >60)
GFR calc non Af Amer: >60 mL/min (ref >60)
Glucose, Bld: 159 mg/dL — ABNORMAL HIGH (ref 70–99)
Potassium: 4.9 mmol/L (ref 3.5–5.1)
Sodium: 141 mmol/L (ref 135–145)

## 2019-08-01 LAB — DIGOXIN LEVEL: Digoxin Level: 0.5 ng/mL — ABNORMAL LOW (ref 0.8–2.0)

## 2019-08-01 MED ORDER — CARVEDILOL 6.25 MG PO TABS: 3.1250 mg | ORAL_TABLET | Freq: Two times a day (BID) | ORAL | 3 refills | Status: DC

## 2019-08-01 MED ORDER — ISOSORBIDE MONONITRATE ER 30 MG PO TB24: 30.0000 mg | ORAL_TABLET | Freq: Every day | ORAL | 3 refills | Status: DC

## 2019-08-01 MED ORDER — NITROGLYCERIN 0.4 MG SL SUBL: 0.4000 mg | SUBLINGUAL_TABLET | SUBLINGUAL | 6 refills | Status: DC | PRN

## 2019-08-01 NOTE — Telephone Encounter (Signed)
Pharmacist is calling regarding Carvedilol doseage. Please call to discuss

## 2019-08-01 NOTE — Patient Instructions (Addendum)
Medication Instructions:  1- INCREASE Lasix for 3 days to 1 tablet (20 mg total) twice daily, then resume 1 tablet (20 mg total) once daily thereafter.  2- START Coreg take 1 tablet (3.125 mg total) twice daily.  *If you need a refill on your cardiac medications before your next appointment, please call your pharmacy*  Lab Work: Your physician recommends that you return for lab work today at the medical mall. ([facility]) No appt is needed. Hours are M-F 7AM- 6 PM.  If you have labs (blood work) drawn today and your tests are completely normal, you will receive your results only by: Marland Kitchen MyChart Message (if you have MyChart) OR . A paper copy in the mail If you have any lab test that is abnormal or we need to change your treatment, we will call you to review the results.  Testing/Procedures: 1- [place]  Your caregiver has ordered a Stress Test with nuclear imaging. The purpose of this test is to evaluate the blood supply to your heart muscle. This procedure is referred to as a "Non-Invasive Stress Test." This is because other than having an IV started in your vein, nothing is inserted or "invades" your body. Cardiac stress tests are done to find areas of poor blood flow to the heart by determining the extent of coronary artery disease (CAD). Some patients exercise on a treadmill, which naturally increases the blood flow to your heart, while others who are  unable to walk on a treadmill due to physical limitations have a pharmacologic/chemical stress agent called Lexiscan . This medicine will mimic walking on a treadmill by temporarily increasing your coronary blood flow.   Please note: these test may take anywhere between 2-4 hours to complete  PLEASE REPORT TO [place] AT THE FIRST DESK WILL DIRECT YOU WHERE TO GO  Date of Procedure:__Next available_________________________________  Arrival Time for Procedure:______________________________  Instructions  regarding medication:   _x_glipizide, metformin_ : Hold diabetes medication morning of procedure  __x__:  Hold betablocker(s) night before procedure and morning of procedure (Coreg)  __x__:  Hold other medications as follows:____Lasix_____________________________________________________________________________________________________________________________________________________________________________________________________________________________________________________________________________________  PLEASE NOTIFY THE OFFICE AT LEAST 24 HOURS IN ADVANCE IF YOU ARE UNABLE TO KEEP YOUR APPOINTMENT.  [phone] AND  PLEASE NOTIFY NUCLEAR MEDICINE AT [facility] AT LEAST 24 HOURS IN ADVANCE IF YOU ARE UNABLE TO KEEP YOUR APPOINTMENT. [phone]  How to prepare for your Myoview test:  1. Do not eat or drink after midnight 2. No caffeine for 24 hours prior to test 3. No smoking 24 hours prior to test. 4. Your medication may be taken with water.  If your doctor stopped a medication because of this test, do not take that medication. 5. Ladies, please do not wear dresses.  Skirts or pants are appropriate. Please wear a short sleeve shirt. 6. No perfume, cologne or lotion. 7. Wear comfortable walking shoes. No heels!   2- Echo  Please return to [facility] on ______________ at _______________ AM/PM for an Echocardiogram. Your physician has requested that you have an echocardiogram. Echocardiography is a painless test that uses sound waves to create images of your heart. It provides your doctor with information about the size and shape of your heart and how well your heart's chambers and valves are working. This procedure takes approximately one hour. There are no restrictions for this procedure. Please note; depending on visual quality an IV may need to be placed.     Follow-Up: At [facility], you and your health needs are our priority.  As part of our continuing mission to  provide you with exceptional heart care, we have created designated Provider Care Teams.  These Care Teams include your primary Cardiologist (physician) and Advanced Practice Providers (APPs -  Physician Assistants and Nurse Practitioners) who all work together to provide you with the care you need, when you need it.  Your next appointment:   1 week(s)  The format for your next appointment:   In Person  Provider:    You may see Julien Nordmann, MD or Marisue Ivan, [name].

## 2019-08-01 NOTE — Progress Notes (Signed)
Office Visit    Patient Name: [name] Buckner Sr. Date of Encounter: 08/01/2019  Primary Care Provider:  Mar Daring, [name] Primary Cardiologist:  [name], MD  Chief Complaint    79 year old male with history of CAD s/p CABG (2004), HFpEF, hypertension, hyperlipidemia, CKD 3, DM 2, and presents today for 1 year follow-up and recent report of CP/SOB and increased DOE.  Past Medical History    Past Medical History:  Diagnosis Date  . Arthritis   . Back pain    hx  . CAD (coronary artery disease)    a. 2004 s/p CABG x 4 (LIMA->LAD, VG->Diag, VG->OM, VG->RCA); b. 2007 Cath: VG->Diag occluded, otw 3/4 patent grafts.  . CKD (chronic kidney disease), stage II-III   . Colon polyps   . DM2 (diabetes mellitus, type 2) (HCC)    stable  . GERD (gastroesophageal reflux disease)   . Glaucoma   . Gout   . HLD (hyperlipidemia)   . HTN (hypertension)   . Neuropathy    Past Surgical History:  Procedure Laterality Date  . BACK SURGERY     Cyst removed  . CORONARY ARTERY BYPASS GRAFT  2004  . KNEE SURGERY      Allergies  Allergies  Allergen Reactions  . Ramipril     History of Present Illness    79 year old male with the above past medical history and including CAD s/p CABG x4 in 2004 with subsequent catheterization in 2007 revealing 3 and 4 patent grafts (occluded vein graft to the diagonal). Recent medication changes include addition of Cialis and discontinuation of Imdur (given interaction between these two mediations). Coreg has also been discontinued with patient previously on Coreg 6.67m BID but no longer taking for unclear reasons at this time. He was seen in January 2019 and stated that he felt well from a cardiac standpoint.  He was last seen in the office 08/18/2018, at which time he reported that he was staying active outside.  His wife had died approximately 2 years prior prior; therefore, he did not like to be in house.  He reported feeling his  days with yardwork and shopping or milling wood.  Or concerning symptoms with this activity.  He did note the occasional palpitations, lasting only seconds, without associated symptoms, and resolving spontaneously.  No other symptoms or symptoms of heart failure including PND, orthopnea, presyncope, syncope, or early satiety.  He did note mild lower extremity, usually resolving in the morning.  It was noted his blood pressure was slightly elevated with recommendation for him to continue to check his numbers over the next week and have a low threshold to titrate lisinopril if needed.   Since that time, the patient reports that he will has been in his usual state of health until a few months ago when he started to have chest discomfort similar to that of his previous pain before his CABG in 2004.  He reports the chest pain only occurs with exertion and does not occur at rest. He reports of chest discomfort is generalized and located across his entire anterior chest.  It usually lasts approximately 2 hours and will eventually improve with rest. It is not pleuritic or TTP and non-radiating. He has yet to take nitro for it (he is also on Cialis; therefore, interaction of these medications precludes taking them at the same time). He denies any presyncope but does note gait unsteadiness which is not new for him and he attributes to leg weakness.  He reports increased DOE or SOB with exertion but denies any SOB with rest. He reports that this recent CP with exertion and DOE has interfered with his daily activities. He notes the most recent episode as this AM, when he brought breakfast over to his girlfriend's house before his appointment. He stated this episode started with walking and again lasted approximately 2 hours but had resolved itself with rest and by the time of his appointment. He did note this CP episode as the most severe, rated 8/10 in severity. He denied any further episodes of CP during today's visit. He  denied racing HR but did note continued palpitations or feeling as if his cell phone was vibrating in his pocket (despite it being in his hand at the time.) He denies any s/sx of worsening heart failure including PND, orthopnea, abdominal distention, or early satiety. No recent syncope or LOC. No s/sx consistent with bleed. He reports medication compliance.  No CP or SOB at any time during today's appointment.   Home Medications    Prior to Admission medications   Medication Sig Start Date End Date Taking? Authorizing Provider  ACCU-CHEK AVIVA PLUS test strip USE AS DIRECTED TO CHECK FASTING BLOOD SUGAR EVERY MORNING 07/09/18  Yes Mar Daring, [name]  allopurinol (ZYLOPRIM) 300 MG tablet TAKE 1 TABLET BY MOUTH ONCE DAILY 05/31/19  Yes Mar Daring, [name]  aspirin 81 MG chewable tablet Chew 81 mg by mouth daily.   Yes [provider]  atorvastatin (LIPITOR) 20 MG tablet TAKE 1 TABLET BY MOUTH ONCE DAILY 08/31/18  Yes Minna Merritts, MD  BD SHARPS CONTAINER HOME MISC Dispose of syringes after B12 injections 10/19/17  Yes Mar Daring, [name]  Blood Glucose Monitoring Suppl (ACCU-CHEK AVIVA PLUS) w/Device KIT To check blood sugar once daily 06/11/17  Yes Burnette, Anderson Malta M, [name]  digoxin (LANOXIN) 0.125 MG tablet TAKE 1 TABLET BY MOUTH ONCE DAILY 02/07/19  Yes Fenton Malling M, [name]  furosemide (LASIX) 20 MG tablet TAKE 1 TABLET BY MOUTH ONCE DAILY 04/19/19  Yes Fenton Malling M, [name]  gabapentin (NEURONTIN) 400 MG capsule TAKE 1 CAPSULE BY MOUTH 3 TIMES DAILY 02/07/19  Yes Fenton Malling M, [name]  glipiZIDE (GLUCOTROL XL) 10 MG 24 hr tablet TAKE 1 TABLET BY MOUTH ONCE DAILY WITH BREAKFAST 07/12/19  Yes Fenton Malling M, [name]  lisinopril (ZESTRIL) 20 MG tablet TAKE 1 TABLET BY MOUTH ONCE DAILY 03/31/19  Yes Fenton Malling M, [name]  meloxicam (MOBIC) 7.5 MG tablet TAKE 1 TABLET BY MOUTH TWICE A DAY 04/19/19  Yes Fenton Malling M, [name]  metFORMIN  (GLUCOPHAGE) 1000 MG tablet TAKE 1 TABLET BY MOUTH 2 TIMES DAILY WITH A MEAL 02/21/19  Yes Fenton Malling M, [name]  nitroGLYCERIN (NITROSTAT) 0.4 MG SL tablet Place 1 tablet (0.4 mg total) under the tongue every 5 (five) minutes as needed for chest pain. 04/21/18  Yes Burnette, Clearnce Sorrel, [name]  ONGLYZA 5 MG TABS tablet TAKE 1 TABLET BY MOUTH ONCE DAILY 02/21/19  Yes Fenton Malling M, [name]  potassium chloride (KLOR-CON) 10 MEQ tablet TAKE 1 TABLET BY MOUTH ONCE DAILY 05/06/19  Yes Mar Daring, [name]  SYRINGE-NEEDLE, DISP, 3 ML (B-D SYRINGE/NEEDLE 3CC/25GX5/8) 25G X 5/8" 3 ML MISC For use with cyanocobalamin injections 10/19/17  Yes Burnette, Jennifer M, [name]  tadalafil (CIALIS) 10 MG tablet Take 1 tablet (10 mg total) by mouth daily as needed for erectile dysfunction. 07/25/19  Yes Mar Daring, [name]  tamsulosin (FLOMAX) 0.4  MG CAPS capsule TAKE 1 CAPSULE BY MOUTH ONCE DAILY 12/24/18  Yes Mar Daring, [name]  allopurinol (ZYLOPRIM) 300 MG tablet TAKE 1 TABLET BY MOUTH ONCE A DAY 11/17/18  Yes Mar Daring, [name]    Review of Systems    He denies pnd, orthopnea, n, v, dizziness, syncope, edema, weight gain, or early satiety. He reports chest pain, palpitations, DOE, and unsteady gait but not dizziness.  All other systems reviewed and are otherwise negative except as noted above.  Physical Exam    VS:  BP (!) 150/62 (BP Location: Left Arm, Patient Position: Sitting, Cuff Size: Normal)   Pulse 69   Ht _0  (1.727 m)   Wt 180 lb 8 oz (81.9 kg)   BMI 27.44 kg/m  , BMI Body mass index is 27.44 kg/m. GEN: Well nourished, well developed, in no acute distress. HEENT: normal. Neck: Supple, no JVD, carotid bruits, or masses. Cardiac: RRR, no murmurs, rubs, or gallops. No clubbing, cyanosis. Mild to moderate bilateral non-pitting edema.  Radials/DP/PT 2+ and equal bilaterally.  Respiratory:  Respirations regular and unlabored, clear to auscultation bilaterally. GI: Firm  and distended, BS + x 4. MS: no deformity or atrophy. Skin: warm and dry, no rash.  Neuro:  Strength and sensation are intact. Psych: Normal affect.  Accessory Clinical Findings    ECG personally reviewed by me today - NSR, 6bpm, IVCD repolarization changes. Nonspecific TWI --no acute ST/T changes- no acute changes.  Filed Weights   08/01/19 1340  Weight: 180 lb 8 oz (81.9 kg)     Assessment & Plan    CAD with stable angina and history of CABG (2004)  --Not current with last episode occurring this AM.  The chest pain has been progressive over the last several months with his most recent episode this morning described as 8/10 and worse in severity than his previous episodes.  It Is described as similar to that before his CABG and lasting approximately 2 to 3 hours.  Given he has not had chest pain for several years, we reviewed the need for further ischemic work-up +/-ED visit if current CP or CP at rest.  He denies any chest pain at rest and prefers to avoid ED.  He has not used his SL nitro.EKG without acute changes. --Plan for further ischemic work-up with stress test and echo. Coreg discontinued in the past due to reasons unknown and will restart low dose Coreg 3.142m BID today for antianginal effect at low dose given also on digoxin. Will need to monitor for bradycardia and recheck a digoxin level with start of this mediation. On digoxin for unclear reasons. Previously on Imdur in the past for antianginal effect; however, current Cialis medication precludes restart of this medication given the interaction discussed today with Cialis and nitrates. Suspect that improvement in volume status and BP may lead to improved CP with recommendation to uptitrate Lasix as below and recheck renal function now and following increase in Lasix as below. Further recommendations pending BMET, echo, and stress test. Continue ASA, statin, and ACE. PRN SL nitro to be taken but not at same time as Cialis with  interaction discussed with patient today.   Acute on chronic HFpEF --No current SOB but does note increasing DOE. Volume overloaded on exam with weight up from 1 lb from that of previous clinic weight. BP elevated with ReDS vest 37% and showing volume overload. Increase Lasix to 244mtwice daily x3 days and recheck BMET today and at  follow-up in 1 week. Continue lisinopril. Start Coreg as above and with repeat of digoxin level. Update echo and stress test with further recommendations at that time.    Palpitations --Denies recent racing HR. Palpitations unchanged from that of previous report. No presyncope or syncope. Start BB as above and monitor for presyncope or bradycardia, as well as check digoxin level. Continue to monitor. Update echo as above. Could consider a Zio monitor at follow-up.   HTN --BP sub-optimal today 150/62 and in the setting of volume overload. Increase lasix 7m qd to 242mBID for three days then drop down to usual dose for now. Pending results of BMET today with further recommendations regarding diuresis pending recheck of BP at follow-up, addition of Coreg 3.125 BID as above.    HLD --Previous LDL 69 and at goal. Continue statin.  CKD --Recheck BMET today and in 1 week with increased diuretic.  Anemia of chronic dz --Hgb low but stable from previous labs. Denies any s/sx consistent with bleeding.  DM2 --Glycemic control recommended for risk factor modification. Recent A1C elevated.  Disposition: Increase lasix x3 days with recheck of renal function / electrolytes now and at follow-up. Start low dose Coreg 3.12533mID with up-titration as tolerated and recheck of digoxin level. Obtain echo and stress test. Follow-up in 1 week.   JacArvil ChacoA-C 08/01/2019, 1:42 PM

## 2019-08-02 ENCOUNTER — Encounter: Payer: Self-pay | Admitting: Physician Assistant

## 2019-08-02 ENCOUNTER — Other Ambulatory Visit: Payer: Self-pay

## 2019-08-02 ENCOUNTER — Ambulatory Visit
Admission: RE | Admit: 2019-08-02 | Discharge: 2019-08-02 | Disposition: A | Discharge status: Home / Self Care | Payer: Medicare PPO | Source: Ambulatory Visit | Attending: Cardiovascular Disease | Admitting: Cardiovascular Disease

## 2019-08-02 DIAGNOSIS — I2510 Atherosclerotic heart disease of native coronary artery without angina pectoris: Secondary | ICD-10-CM | POA: Diagnosis not present

## 2019-08-02 DIAGNOSIS — R002 Palpitations: Secondary | ICD-10-CM | POA: Diagnosis present

## 2019-08-02 DIAGNOSIS — I25118 Atherosclerotic heart disease of native coronary artery with other forms of angina pectoris: Secondary | ICD-10-CM

## 2019-08-02 LAB — NM MYOCAR MULTI W/SPECT W/WALL MOTION / EF
LV dias vol: 158 mL (ref 62–150)
LV sys vol: 70 mL
Peak HR: 100 {beats}/min
Percent HR: 70 %
RATE: 0.46
Rest HR: 64 {beats}/min
TID: 1.05

## 2019-08-02 MED ORDER — CARVEDILOL 3.125 MG PO TABS: 3.1250 mg | ORAL_TABLET | Freq: Two times a day (BID) | ORAL | 3 refills | Status: DC

## 2019-08-02 MED ORDER — TECHNETIUM TC 99M TETROFOSMIN IV KIT
31.1900 | PACK | Freq: Once | INTRAVENOUS | Status: AC | PRN
  Administered 2019-08-02: 10:00:00 31.19 via INTRAVENOUS

## 2019-08-02 MED ORDER — REGADENOSON 0.4 MG/5ML IV SOLN
0.4000 mg | Freq: Once | INTRAVENOUS | Status: AC
  Administered 2019-08-02: 0.4 mg via INTRAVENOUS

## 2019-08-02 MED ORDER — TECHNETIUM TC 99M TETROFOSMIN IV KIT
9.8010 | PACK | Freq: Once | INTRAVENOUS | Status: AC | PRN
  Administered 2019-08-02: 09:00:00 9.801 via INTRAVENOUS

## 2019-08-02 NOTE — Telephone Encounter (Signed)
Spoke with Thayer Ohm at pharmacy and they dispensed carvedilol 3.125 mg twice a day instead of 1/2 tablets of the 6.25 mg. He wanted to confirm if that was ok and reviewed chart and instructions. Confirmed that should be fine. No further questions

## 2019-08-04 ENCOUNTER — Ambulatory Visit (INDEPENDENT_AMBULATORY_CARE_PROVIDER_SITE_OTHER): Payer: Medicare PPO

## 2019-08-04 ENCOUNTER — Other Ambulatory Visit: Payer: Self-pay

## 2019-08-04 ENCOUNTER — Telehealth: Payer: Self-pay

## 2019-08-04 DIAGNOSIS — I208 Other forms of angina pectoris: Secondary | ICD-10-CM | POA: Diagnosis not present

## 2019-08-04 NOTE — Telephone Encounter (Signed)
Spoke with pt regarding lab results per Leafy Kindle, PA.  Pt verbalized understanding that he is to have a repeat BMET in 1 week and that he does not need to make any changes to his Digoxin.

## 2019-08-04 NOTE — Telephone Encounter (Signed)
-----   Message from Lennon Alstrom, [name] sent at 08/03/2019  5:05 PM EST ----- Please let [name] know that his renal function is stable.  We will recheck his kidneys with another BMET in 1 week as previously planned.    We do not need to make any changes to his digoxin based on his labs.  He should continue his current dose of digoxin.

## 2019-08-05 ENCOUNTER — Telehealth: Payer: Self-pay | Admitting: *Deleted

## 2019-08-05 NOTE — Telephone Encounter (Signed)
-----   Message from Antonieta Iba, MD sent at 08/04/2019  5:35 PM EST ----- Needs appointment with me to discuss recent stress test results and echocardiogram results  Stress test suggesting possible blockage

## 2019-08-05 NOTE — Progress Notes (Addendum)
Cardiology Office Note  Date:  08/09/2019   ID:  [name] Gross Sr., DOB 11/19/1939, MRN [id number]  PCP:  Mar Daring, [name]   Chief Complaint  Patient presents with  . office visit    Discuss myoview results; Meds verbally reviewed with patient.    HPI:  [name] is a very pleasant 79 year old gentleman with  coronary artery disease,  bypass surgery in 2004, occluded graft to the diagonal branch with 3 other patent grafts to the RCA, OM with a LIMA to the LAD by catheterization in 2007  who presents for routine followup Of his coronary artery disease .   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 the wood,  Lots of heavy manual labor Denies any significant chest pain on exertion  He does report having some shortness of breath if he walks too quickly.  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  Chronic back pain. Previous cortisone shots in his back   PMH:   has a past medical history of Arthritis, Back pain, CAD (coronary artery disease), CKD (chronic kidney disease), stage II-III, Colon polyps, DM2 (diabetes mellitus, type 2) ([place]), GERD (gastroesophageal reflux disease), Glaucoma, Gout, HLD (hyperlipidemia), HTN (hypertension), and Neuropathy.  PSH:    Past Surgical History:  Procedure  Laterality Date  . BACK SURGERY     Cyst removed  . CORONARY ARTERY BYPASS GRAFT  2004  . KNEE SURGERY      Current Outpatient Medications  Medication Sig Dispense Refill  . ACCU-CHEK AVIVA PLUS test strip USE AS DIRECTED TO CHECK FASTING BLOOD SUGAR EVERY MORNING 100 each 4  . allopurinol (ZYLOPRIM) 300 MG tablet TAKE 1 TABLET BY MOUTH ONCE DAILY 90 tablet 1  . aspirin 81 MG chewable tablet Chew 81 mg by mouth daily.    Marland Kitchen atorvastatin (LIPITOR) 20 MG tablet TAKE 1 TABLET BY MOUTH ONCE DAILY 90 tablet 3  . BD SHARPS CONTAINER HOME MISC Dispose of syringes after B12 injections 1 each 5  . Blood Glucose Monitoring Suppl (ACCU-CHEK AVIVA PLUS) w/Device KIT To check blood sugar once daily 1 kit 0  . carvedilol (COREG) 3.125 MG tablet Take 1 tablet (3.125 mg total) by mouth 2 (two) times daily. 180 tablet 3  . digoxin (LANOXIN) 0.125 MG tablet TAKE 1 TABLET BY MOUTH ONCE DAILY 90 tablet 1  . furosemide (LASIX) 20 MG tablet TAKE 1 TABLET BY MOUTH ONCE DAILY 90 tablet 3  . gabapentin (NEURONTIN) 400 MG capsule TAKE 1 CAPSULE BY MOUTH 3 TIMES DAILY 90 capsule 5  . glipiZIDE (GLUCOTROL XL) 10 MG 24 hr tablet TAKE 1 TABLET BY MOUTH ONCE DAILY WITH BREAKFAST 90 tablet 3  . lisinopril (ZESTRIL) 20 MG tablet TAKE 1 TABLET BY MOUTH ONCE DAILY 90 tablet 3  . meloxicam (MOBIC) 7.5 MG tablet TAKE 1  TABLET BY MOUTH TWICE A DAY 180 tablet 1  . metFORMIN (GLUCOPHAGE) 1000 MG tablet TAKE 1 TABLET BY MOUTH 2 TIMES DAILY WITH A MEAL 180 tablet 1  . nitroGLYCERIN (NITROSTAT) 0.4 MG SL tablet Place 1 tablet (0.4 mg total) under the tongue every 5 (five) minutes as needed for chest pain. 25 tablet 6  . ONGLYZA 5 MG TABS tablet TAKE 1 TABLET BY MOUTH ONCE DAILY 90 tablet 1  . potassium chloride (KLOR-CON) 10 MEQ tablet TAKE 1 TABLET BY MOUTH ONCE DAILY 90 tablet 1  . SYRINGE-NEEDLE, DISP, 3 ML (B-D SYRINGE/NEEDLE 3CC/25GX5/8) 25G X 5/8" 3 ML MISC For use with cyanocobalamin injections 50 each 0  . tadalafil (CIALIS)  10 MG tablet Take 1 tablet (10 mg total) by mouth daily as needed for erectile dysfunction. 10 tablet 0  . tamsulosin (FLOMAX) 0.4 MG CAPS capsule TAKE 1 CAPSULE BY MOUTH ONCE DAILY 90 capsule 1   No current facility-administered medications for this visit.     Allergies:   Ramipril   Social History:  The patient  reports that he has never smoked. His smokeless tobacco use includes chew. He reports that he does not drink alcohol or use drugs.   Family History:   Family history is unknown by patient.    Review of Systems: Review of Systems  Constitutional: Negative.   HENT: Negative.   Respiratory: Positive for shortness of breath.   Cardiovascular: Negative.   Gastrointestinal: Negative.   Musculoskeletal: Negative.   Neurological: Negative.   Psychiatric/Behavioral: Negative.   All other systems reviewed and are negative.   PHYSICAL EXAM: VS:  BP 136/80 (BP Location: Left Arm, Patient Position: Sitting, Cuff Size: Normal)   Pulse 67   Ht '5\' 8"'  (1.727 m)   Wt 185 lb (83.9 kg)   SpO2 96%   BMI 28.13 kg/m  , BMI Body mass index is 28.13 kg/m.  No significant change in physical exam as detailed below GEN: Well nourished, well developed, in no acute distress  HEENT: normal  Neck: no JVD, carotid bruits, or masses Cardiac: RRR; no murmurs, rubs, or gallops,no edema  Respiratory:  clear to auscultation bilaterally, normal work of breathing GI: soft, nontender, nondistended, + BS MS: no deformity or atrophy  Skin: warm and dry, no rash Neuro:  Strength and sensation are intact Psych: euthymic mood, full affect   Recent Labs: 04/22/2019: ALT 22; Hemoglobin 12.0; Platelets 206 08/01/2019: BUN 12; Creatinine, Ser 1.15; Potassium 4.9; Sodium 141    Lipid Panel Lab Results  Component Value Date   CHOL 119 04/22/2019   HDL 38 (L) 04/22/2019   LDLCALC 69 04/22/2019   TRIG 52 04/22/2019      Wt Readings from Last 3 Encounters:  08/09/19 185 lb (83.9 kg)  08/01/19 180  lb 8 oz (81.9 kg)  07/25/19 179 lb (81.2 kg)      ASSESSMENT AND PLAN:  CAD with stable angina Echocardiogram with region of hypokinesis basal inferior wall Also with stress test showing ischemia basal inferior to mid inferior wall He reports he is asymptomatic, Discussed various treatment options including catheterization versus medical management He prefers medical management at this time given he is very active with no anginal symptoms 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 If he walks long distances very fast gets short of breath but recovers quickly -Non-smoker, cholesterol at goal Long discussion with him concerning return precautions for worsening shortness of breath or anginal symptoms Daughter has presented  with him today  essential hypertension - Plan: EKG 12-Lead Currently takes Lasix daily Given echocardiogram with mildly elevated right heart pressures will add Lasix 20 twice daily 3 days a week Continue current blood pressure medications  Type 2 diabetes mellitus with complication, without long-term current use of insulin ([place]) Suggested exercise program, weight loss Weight is up over the past year A1c 7.5   Total encounter time more than 45 minutes  Greater than 50% was spent in counseling and coordination of care with the patient   Disposition:   F/U  6 months   Orders Placed This Encounter  Procedures  . EKG 12-Lead     Signed, Esmond Plants, M.D., Ph.D. 08/09/2019  [place], [place]

## 2019-08-05 NOTE — Telephone Encounter (Signed)
Left voicemail message to call back for review of results and appointment.

## 2019-08-08 NOTE — Telephone Encounter (Signed)
Spoke with patient and reviewed that Dr. Mariah Milling wanted him to come in and review test results and recommendations. Confirmed appointment with patient and advised to arrive a little early at the [facility] entrance and they would then let him know how to get to our office. He verbalized understanding of our conversation, confirmed appointment information, and had no further questions at this time.

## 2019-08-09 ENCOUNTER — Other Ambulatory Visit: Payer: Self-pay

## 2019-08-09 ENCOUNTER — Ambulatory Visit: Payer: Medicare PPO | Admitting: Cardiovascular Disease

## 2019-08-09 ENCOUNTER — Encounter: Payer: Self-pay | Admitting: Cardiovascular Disease

## 2019-08-09 VITALS — BP 136/80 | HR 67 | Ht 68.0 in | Wt 185.0 lb

## 2019-08-09 DIAGNOSIS — R002 Palpitations: Secondary | ICD-10-CM | POA: Diagnosis not present

## 2019-08-09 DIAGNOSIS — I10 Essential (primary) hypertension: Secondary | ICD-10-CM

## 2019-08-09 DIAGNOSIS — E785 Hyperlipidemia, unspecified: Secondary | ICD-10-CM | POA: Diagnosis not present

## 2019-08-09 DIAGNOSIS — R0600 Dyspnea, unspecified: Secondary | ICD-10-CM

## 2019-08-09 DIAGNOSIS — I5033 Acute on chronic diastolic (congestive) heart failure: Secondary | ICD-10-CM

## 2019-08-09 DIAGNOSIS — R0609 Other forms of dyspnea: Secondary | ICD-10-CM

## 2019-08-09 DIAGNOSIS — I25118 Atherosclerotic heart disease of native coronary artery with other forms of angina pectoris: Secondary | ICD-10-CM

## 2019-08-09 DIAGNOSIS — E118 Type 2 diabetes mellitus with unspecified complications: Secondary | ICD-10-CM

## 2019-08-09 NOTE — Patient Instructions (Signed)
Medication Instructions:  Lasix 20 twice a day three days a week, Otherwise lasix 20 daily   If you need a refill on your cardiac medications before your next appointment, please call your pharmacy.    Lab work: No new labs needed   If you have labs (blood work) drawn today and your tests are completely normal, you will receive your results only by: Marland Kitchen MyChart Message (if you have MyChart) OR . A paper copy in the mail If you have any lab test that is abnormal or we need to change your treatment, we will call you to review the results.   Testing/Procedures: No new testing needed   Follow-Up: At [facility], you and your health needs are our priority.  As part of our continuing mission to provide you with exceptional heart care, we have created designated Provider Care Teams.  These Care Teams include your primary Cardiologist (physician) and Advanced Practice Providers (APPs -  Physician Assistants and Nurse Practitioners) who all work together to provide you with the care you need, when you need it.  . You will need a follow up appointment in 6 months   . Providers on your designated Care Team:   . Nicolasa Ducking, NP . Eula Listen, [name] . Marisue Ivan, [name]  Any Other Special Instructions Will Be Listed Below (If Applicable).  For educational health videos Log in to : [email or web address] Or : [email or web address], password : [email or web address]

## 2019-08-10 MED ORDER — FUROSEMIDE 20 MG PO TABS: 20.0000 mg | ORAL_TABLET | ORAL | 3 refills | Status: DC

## 2019-08-10 NOTE — Addendum Note (Signed)
Addended by: Bryna Colander on: 08/10/2019 08:39 AM   Modules accepted: Orders

## 2019-08-11 ENCOUNTER — Ambulatory Visit: Payer: Medicare PPO | Admitting: Physician Assistant

## 2019-08-19 ENCOUNTER — Encounter: Payer: Self-pay | Admitting: Physician Assistant

## 2019-08-19 ENCOUNTER — Other Ambulatory Visit: Payer: Self-pay

## 2019-08-19 ENCOUNTER — Other Ambulatory Visit: Payer: Self-pay | Admitting: Physician Assistant

## 2019-08-19 ENCOUNTER — Ambulatory Visit (INDEPENDENT_AMBULATORY_CARE_PROVIDER_SITE_OTHER): Payer: Medicare PPO | Admitting: Physician Assistant

## 2019-08-19 DIAGNOSIS — Z20822 Contact with and (suspected) exposure to covid-19: Secondary | ICD-10-CM | POA: Diagnosis not present

## 2019-08-19 DIAGNOSIS — R432 Parageusia: Secondary | ICD-10-CM | POA: Diagnosis not present

## 2019-08-19 DIAGNOSIS — R430 Anosmia: Secondary | ICD-10-CM

## 2019-08-19 DIAGNOSIS — R0981 Nasal congestion: Secondary | ICD-10-CM

## 2019-08-19 DIAGNOSIS — E119 Type 2 diabetes mellitus without complications: Secondary | ICD-10-CM

## 2019-08-19 NOTE — Progress Notes (Signed)
Patient: [name] Mcnay Sr. Male    DOB: 11/19/1939   79 y.o.   MRN: [id number] Visit Date: 08/19/2019  Today's Provider: Mar Daring, [name]   No chief complaint on file.  Subjective:     Virtual Visit via Telephone Note  I connected with [name] Gross Sr. on 08/19/19 at  4:00 PM EST by telephone and verified that I am speaking with the correct person using two identifiers.  Location: Patient: Home Provider: [facility]   I discussed the limitations, risks, security and privacy concerns of performing an evaluation and management service by telephone and the availability of in person appointments. I also discussed with the patient that there may be a patient responsible charge related to this service. The patient expressed understanding and agreed to proceed.  HPI  [name] is a 79 yr old male that presents today via telephone visit. Yesterday morning he awoke with a headache and took tylenol and that helped. Then today he noticed he had no sense of smell or taste. He does mention some mild sinus congestion but not bad enough to take anything. Unsure of any exposure. To covid-19. No fevers, chills, nausea, vomiting, myalgias. Does report that his daughter in law did have covid but he has not been around her recently.    Allergies  Allergen Reactions  . Ramipril      Current Outpatient Medications:  .  ACCU-CHEK AVIVA PLUS test strip, USE AS DIRECTED TO CHECK FASTING BLOOD SUGAR EVERY MORNING, Disp: 100 each, Rfl: 4 .  allopurinol (ZYLOPRIM) 300 MG tablet, TAKE 1 TABLET BY MOUTH ONCE DAILY, Disp: 90 tablet, Rfl: 1 .  aspirin 81 MG chewable tablet, Chew 81 mg by mouth daily., Disp: , Rfl:  .  atorvastatin (LIPITOR) 20 MG tablet, TAKE 1 TABLET BY MOUTH ONCE DAILY, Disp: 90 tablet, Rfl: 3 .  BD SHARPS CONTAINER HOME MISC, Dispose of syringes after B12 injections, Disp: 1 each, Rfl: 5 .  Blood Glucose Monitoring Suppl (ACCU-CHEK AVIVA PLUS) w/Device KIT,  To check blood sugar once daily, Disp: 1 kit, Rfl: 0 .  carvedilol (COREG) 3.125 MG tablet, Take 1 tablet (3.125 mg total) by mouth 2 (two) times daily., Disp: 180 tablet, Rfl: 3 .  digoxin (LANOXIN) 0.125 MG tablet, TAKE 1 TABLET BY MOUTH ONCE DAILY, Disp: 90 tablet, Rfl: 1 .  furosemide (LASIX) 20 MG tablet, Take 1 tablet (20 mg total) by mouth as directed. Take 20 mg twice a day 3 days a week and then once daily on other days., Disp: 126 tablet, Rfl: 3 .  gabapentin (NEURONTIN) 400 MG capsule, TAKE 1 CAPSULE BY MOUTH 3 TIMES DAILY, Disp: 90 capsule, Rfl: 5 .  glipiZIDE (GLUCOTROL XL) 10 MG 24 hr tablet, TAKE 1 TABLET BY MOUTH ONCE DAILY WITH BREAKFAST, Disp: 90 tablet, Rfl: 3 .  lisinopril (ZESTRIL) 20 MG tablet, TAKE 1 TABLET BY MOUTH ONCE DAILY, Disp: 90 tablet, Rfl: 3 .  meloxicam (MOBIC) 7.5 MG tablet, TAKE 1 TABLET BY MOUTH TWICE A DAY, Disp: 180 tablet, Rfl: 1 .  metFORMIN (GLUCOPHAGE) 1000 MG tablet, TAKE 1 TABLET BY MOUTH 2 TIMES DAILY WITH A MEAL, Disp: 180 tablet, Rfl: 1 .  nitroGLYCERIN (NITROSTAT) 0.4 MG SL tablet, Place 1 tablet (0.4 mg total) under the tongue every 5 (five) minutes as needed for chest pain., Disp: 25 tablet, Rfl: 6 .  ONGLYZA 5 MG TABS tablet, TAKE 1 TABLET BY MOUTH ONCE DAILY, Disp: 90 tablet,  Rfl: 0 .  potassium chloride (KLOR-CON) 10 MEQ tablet, TAKE 1 TABLET BY MOUTH ONCE DAILY, Disp: 90 tablet, Rfl: 1 .  SYRINGE-NEEDLE, DISP, 3 ML (B-D SYRINGE/NEEDLE 3CC/25GX5/8) 25G X 5/8" 3 ML MISC, For use with cyanocobalamin injections, Disp: 50 each, Rfl: 0 .  tadalafil (CIALIS) 10 MG tablet, Take 1 tablet (10 mg total) by mouth daily as needed for erectile dysfunction., Disp: 10 tablet, Rfl: 0 .  tamsulosin (FLOMAX) 0.4 MG CAPS capsule, TAKE 1 CAPSULE BY MOUTH ONCE DAILY, Disp: 90 capsule, Rfl: 1  Review of Systems  Constitutional: Negative.   HENT: Positive for congestion, sinus pressure and sinus pain. Negative for ear discharge, ear pain, postnasal drip, rhinorrhea  and sore throat.        Lost of taste of smell   Respiratory: Negative.   Cardiovascular: Negative.   Gastrointestinal: Positive for diarrhea (One episode yesterday). Negative for abdominal distention, abdominal pain, anal bleeding, blood in stool, constipation, nausea, rectal pain and vomiting.  Neurological: Positive for headaches. Negative for dizziness and light-headedness.    Social History   Tobacco Use  . Smoking status: Never Smoker  . Smokeless tobacco: Current User    Types: Chew  . Tobacco comment: chew tobacco - 1 pack last 3 weeks  Substance Use Topics  . Alcohol use: No    Alcohol/week: 0.0 standard drinks      Objective:   There were no vitals taken for this visit. There were no vitals filed for this visit.There is no height or weight on file to calculate BMI.   Physical Exam Vitals reviewed.  Constitutional:      General: He is not in acute distress. Pulmonary:     Effort: No respiratory distress (able to speak in full sentences without issue).  Neurological:     Mental Status: He is alert.      No results found for any visits on 08/19/19.     Assessment & Plan    1. Suspected COVID-19 virus infection Advised to isolate for 10 days, until 08/28/19. Advised to use Mucinex for congestion. May benefit from [facility] as well. Push fluids, Stay active as tolerated. Will call Monday for [place] to help him schedule a covid 19 test. Unable to obtain today due to timing (testing sites closed). Advised if symptoms worsen and he develops SOB or difficulty breathing he needs to go to UC or ER over the weekend. He agrees.   2. Loss of smell See above medical treatment plan.  3. Loss of taste See above medical treatment plan.  4. Sinus congestion See above medical treatment plan.    I discussed the assessment and treatment plan with the patient. The patient was provided an opportunity to ask questions and all were answered. The patient agreed with the plan and  demonstrated an understanding of the instructions.   The patient was advised to call back or seek an in-person evaluation if the symptoms worsen or if the condition fails to improve as anticipated.  I provided 14 minutes of non-face-to-face time during this encounter.     Mar Daring, [name]  [facility]

## 2019-08-22 ENCOUNTER — Telehealth: Payer: Self-pay

## 2019-08-22 NOTE — Telephone Encounter (Signed)
Copied from CRM [id number]. Topic: General - Inquiry >> Aug 22, 2019  8:33 AM Reggie Pile, NT wrote: Reason for CRM: Patient called in stating he was told by PCP to update. Pt states he feels better since Friday but is nauseous. Please advise.

## 2019-08-29 ENCOUNTER — Other Ambulatory Visit: Payer: Self-pay | Admitting: Physician Assistant

## 2019-08-29 DIAGNOSIS — E118 Type 2 diabetes mellitus with unspecified complications: Secondary | ICD-10-CM

## 2019-08-29 MED ORDER — ACCU-CHEK AVIVA PLUS VI STRP: ORAL_STRIP | 4 refills | Status: DC

## 2019-08-29 NOTE — Telephone Encounter (Signed)
[facility] faxed refill request for the following medications:   ACCU-CHEK AVIVA PLUS test strip - 100   Please advise.  Thanks, [employer]

## 2019-08-30 ENCOUNTER — Ambulatory Visit: Payer: Medicare Other | Admitting: Cardiovascular Disease

## 2019-09-23 ENCOUNTER — Other Ambulatory Visit: Payer: Self-pay | Admitting: Physician Assistant

## 2019-09-23 ENCOUNTER — Other Ambulatory Visit: Payer: Self-pay | Admitting: Cardiovascular Disease

## 2019-09-23 DIAGNOSIS — N401 Enlarged prostate with lower urinary tract symptoms: Secondary | ICD-10-CM

## 2019-10-14 ENCOUNTER — Other Ambulatory Visit: Payer: Self-pay | Admitting: Physician Assistant

## 2019-10-14 DIAGNOSIS — M2550 Pain in unspecified joint: Secondary | ICD-10-CM

## 2019-10-14 NOTE — Telephone Encounter (Signed)
Requested medication (s) are due for refill today: yes  Requested medication (s) are on the active medication list: yes  Last refill:  04/19/19  Future visit scheduled: yes  Notes to clinic:  Analgesics: COX2 inhibitors failed  Requested Prescriptions  Pending Prescriptions Disp Refills   meloxicam (MOBIC) 7.5 MG tablet [Pharmacy Med Name: MELOXICAM 7.5 MG TAB] 180 tablet 1    Sig: TAKE 1 TABLET BY MOUTH TWICE (2) DAILY      Analgesics:  COX2 Inhibitors Failed - 10/14/2019 11:21 AM      Failed - HGB in normal range and within 360 days    Hemoglobin  Date Value Ref Range Status  04/22/2019 12.0 (L) 13.0 - 17.7 g/dL Final          Passed - Cr in normal range and within 360 days    Creat  Date Value Ref Range Status  06/12/2017 1.43 (H) 0.70 - 1.18 mg/dL Final    Comment:    For patients >11 years of age, the reference limit for Creatinine is approximately 13% higher for people identified as African-American. .    Creatinine, Ser  Date Value Ref Range Status  08/01/2019 1.15 0.61 - 1.24 mg/dL Final          Passed - Patient is not pregnant      Passed - Valid encounter within last 12 months    Recent Outpatient Visits           1 month ago Suspected COVID-19 virus infection   [facility] [place], [place], [place]   2 months ago Type 2 diabetes mellitus with stage 2 chronic kidney disease, without long-term current use of insulin [facility])   [facility] [place], Alessandra Bevels, [name]   5 months ago Medicare annual wellness visit, subsequent   [facility] [place], [place]   9 months ago Type 2 diabetes mellitus with complication, without long-term current use of insulin [facility])   [facility] [place], Alessandra Bevels, [place]   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

## 2019-10-21 NOTE — Progress Notes (Signed)
Patient: [name] Bowlby Sr. Male    DOB: 11/19/1939   79 y.o.   MRN: [id number] Visit Date: 10/24/2019  Today's Provider: Mar Daring, [name]   Chief Complaint  Patient presents with  . Follow-up    T2DM   Subjective:     HPI   Diabetes Mellitus Type II, Follow-up:   Lab Results  Component Value Date   HGBA1C 7.0 (A) 10/24/2019   HGBA1C 7.5 (A) 07/25/2019   HGBA1C 7.8 (H) 04/22/2019    Last seen for diabetes 3 months ago.  Management since then includes none.A1c has improved to 7.5. Continue Glipizide XL 62m daily, metformin 10086mBID, and Onglyza 4m67mHe reports excellent compliance with treatment. He is not having side effects.  Current symptoms include none and have been stable. Home blood sugar records: fasting range: 120's  Episodes of hypoglycemia? no   Current insulin regiment: Is not on insulin Weight trend: stable Current exercise: yard work and keeps active Current diet habits: in general, an "unhealthy" diet  Pertinent Labs:    Component Value Date/Time   CHOL 119 04/22/2019 1119   TRIG 52 04/22/2019 1119   TRIG 136 02/28/2008 0000   HDL 38 (L) 04/22/2019 1119   LDLCALC 69 04/22/2019 1119   CREATININE 1.15 08/01/2019 1540   CREATININE 1.43 (H) 06/12/2017 1146    Wt Readings from Last 3 Encounters:  10/24/19 179 lb 6.4 oz (81.4 kg)  08/09/19 185 lb (83.9 kg)  08/01/19 180 lb 8 oz (81.9 kg)    ------------------------------------------------------------------------   Allergies  Allergen Reactions  . Ramipril      Current Outpatient Medications:  .  allopurinol (ZYLOPRIM) 300 MG tablet, TAKE 1 TABLET BY MOUTH ONCE DAILY, Disp: 90 tablet, Rfl: 1 .  aspirin 81 MG chewable tablet, Chew 81 mg by mouth daily., Disp: , Rfl:  .  atorvastatin (LIPITOR) 20 MG tablet, TAKE 1 TABLET BY MOUTH ONCE DAILY, Disp: 90 tablet, Rfl: 3 .  BD SHARPS CONTAINER HOME MISC, Dispose of syringes after B12 injections, Disp: 1 each, Rfl: 5 .   Blood Glucose Monitoring Suppl (ACCU-CHEK AVIVA PLUS) w/Device KIT, To check blood sugar once daily, Disp: 1 kit, Rfl: 0 .  carvedilol (COREG) 3.125 MG tablet, Take 1 tablet (3.125 mg total) by mouth 2 (two) times daily., Disp: 180 tablet, Rfl: 3 .  digoxin (LANOXIN) 0.125 MG tablet, TAKE 1 TABLET BY MOUTH ONCE DAILY, Disp: 90 tablet, Rfl: 1 .  furosemide (LASIX) 20 MG tablet, Take 1 tablet (20 mg total) by mouth as directed. Take 20 mg twice a day 3 days a week and then once daily on other days., Disp: 126 tablet, Rfl: 3 .  gabapentin (NEURONTIN) 400 MG capsule, TAKE 1 CAPSULE BY MOUTH 3 TIMES DAILY, Disp: 90 capsule, Rfl: 5 .  glipiZIDE (GLUCOTROL XL) 10 MG 24 hr tablet, TAKE 1 TABLET BY MOUTH ONCE DAILY WITH BREAKFAST, Disp: 90 tablet, Rfl: 3 .  glucose blood (ACCU-CHEK AVIVA PLUS) test strip, USE AS DIRECTED TO CHECK FASTING BLOOD SUGAR EVERY MORNING, Disp: 100 each, Rfl: 4 .  lisinopril (ZESTRIL) 20 MG tablet, TAKE 1 TABLET BY MOUTH ONCE DAILY, Disp: 90 tablet, Rfl: 3 .  meloxicam (MOBIC) 7.5 MG tablet, TAKE 1 TABLET BY MOUTH TWICE (2) DAILY, Disp: 180 tablet, Rfl: 1 .  metFORMIN (GLUCOPHAGE) 1000 MG tablet, TAKE 1 TABLET BY MOUTH 2 TIMES DAILY WITH A MEAL, Disp: 180 tablet, Rfl: 1 .  nitroGLYCERIN (NITROSTAT) 0.4  MG SL tablet, Place 1 tablet (0.4 mg total) under the tongue every 5 (five) minutes as needed for chest pain., Disp: 25 tablet, Rfl: 6 .  ONGLYZA 5 MG TABS tablet, TAKE 1 TABLET BY MOUTH ONCE DAILY, Disp: 90 tablet, Rfl: 0 .  potassium chloride (KLOR-CON) 10 MEQ tablet, TAKE 1 TABLET BY MOUTH ONCE DAILY, Disp: 90 tablet, Rfl: 1 .  SYRINGE-NEEDLE, DISP, 3 ML (B-D SYRINGE/NEEDLE 3CC/25GX5/8) 25G X 5/8" 3 ML MISC, For use with cyanocobalamin injections, Disp: 50 each, Rfl: 0 .  tadalafil (CIALIS) 10 MG tablet, Take 1 tablet (10 mg total) by mouth daily as needed for erectile dysfunction., Disp: 10 tablet, Rfl: 0 .  tamsulosin (FLOMAX) 0.4 MG CAPS capsule, TAKE 1 CAPSULE BY MOUTH ONCE  DAILY, Disp: 90 capsule, Rfl: 0  Review of Systems  Constitutional: Negative.   Eyes: Negative for visual disturbance.  Respiratory: Negative.   Cardiovascular: Positive for leg swelling (by end of day but improves). Negative for chest pain.  Endocrine: Negative for polydipsia, polyphagia and polyuria.    Social History   Tobacco Use  . Smoking status: Never Smoker  . Smokeless tobacco: Current User    Types: Chew  . Tobacco comment: chew tobacco - 1 pack last 3 weeks  Substance Use Topics  . Alcohol use: No    Alcohol/week: 0.0 standard drinks      Objective:   BP (!) 178/77 (BP Location: Left Arm, Patient Position: Sitting, Cuff Size: Large)   Pulse (!) 53   Temp (!) 97.1 F (36.2 C) (Temporal)   Resp 16   Wt 179 lb 6.4 oz (81.4 kg)   BMI 27.28 kg/m  Vitals:   10/24/19 0702  BP: (!) 178/77  Pulse: (!) 53  Resp: 16  Temp: (!) 97.1 F (36.2 C)  TempSrc: Temporal  Weight: 179 lb 6.4 oz (81.4 kg)  Body mass index is 27.28 kg/m.   Physical Exam Vitals reviewed.  Constitutional:      General: He is not in acute distress.    Appearance: Normal appearance. He is well-developed. He is not ill-appearing or diaphoretic.  HENT:     Head: Normocephalic and atraumatic.  Cardiovascular:     Rate and Rhythm: Normal rate and regular rhythm.     Heart sounds: Normal heart sounds. No murmur. No friction rub. No gallop.   Pulmonary:     Effort: Pulmonary effort is normal. No respiratory distress.     Breath sounds: Normal breath sounds. No wheezing or rales.  Musculoskeletal:     Cervical back: Normal range of motion and neck supple.     Right lower leg: No edema.     Left lower leg: No edema.  Neurological:     Mental Status: He is alert.      Results for orders placed or performed in visit on 10/24/19  POCT glycosylated hemoglobin (Hb A1C)  Result Value Ref Range   Hemoglobin A1C 7.0 (A) 4.0 - 5.6 %   Est. average glucose Bld gHb Est-mCnc         Assessment  & Plan    1. Type 2 diabetes mellitus with stage 2 chronic kidney disease, without long-term current use of insulin (HCC) A1c improved from 7.5 to 7.0. Continue Glipizide XR 25m daily, metformin 10035mBID. Return in 3 months.   2. Vasculogenic erectile dysfunction, unspecified vasculogenic erectile dysfunction type Failed Cialis 1026mChanged to sildenafil as below.  - sildenafil (REVATIO) 20 MG tablet; Take 1 tablet (20  mg total) by mouth daily as needed.  Dispense: 10 tablet; Refill: 0  3. Essential hypertension Elevated today. Continue Carvedilol 3.136m BID, Lisinopril 210mdaily. Will check BP at home this week and call by Friday if readings remain high. If high, will increase Lisinopril to 4051mReturn in 3 months.     JenMar DaringA-C  [facility]

## 2019-10-24 ENCOUNTER — Ambulatory Visit (INDEPENDENT_AMBULATORY_CARE_PROVIDER_SITE_OTHER): Payer: Medicare PPO | Admitting: Physician Assistant

## 2019-10-24 ENCOUNTER — Other Ambulatory Visit: Payer: Self-pay

## 2019-10-24 ENCOUNTER — Encounter: Payer: Self-pay | Admitting: Physician Assistant

## 2019-10-24 VITALS — BP 178/77 | HR 53 | Temp 97.1°F | Resp 16 | Wt 179.4 lb

## 2019-10-24 DIAGNOSIS — E1122 Type 2 diabetes mellitus with diabetic chronic kidney disease: Secondary | ICD-10-CM

## 2019-10-24 DIAGNOSIS — N529 Male erectile dysfunction, unspecified: Secondary | ICD-10-CM

## 2019-10-24 DIAGNOSIS — N182 Chronic kidney disease, stage 2 (mild): Secondary | ICD-10-CM | POA: Diagnosis not present

## 2019-10-24 DIAGNOSIS — I10 Essential (primary) hypertension: Secondary | ICD-10-CM | POA: Diagnosis not present

## 2019-10-24 LAB — POCT GLYCOSYLATED HEMOGLOBIN (HGB A1C): Hemoglobin A1C: 7 % — AB (ref 4.0–5.6)

## 2019-10-24 MED ORDER — SILDENAFIL CITRATE 20 MG PO TABS: 20.0000 mg | ORAL_TABLET | Freq: Every day | ORAL | 0 refills | Status: DC | PRN

## 2019-10-24 NOTE — Patient Instructions (Signed)
[place] for memory   DASH Eating Plan DASH stands for "Dietary Approaches to Stop Hypertension." The DASH eating plan is a healthy eating plan that has been shown to reduce high blood pressure (hypertension). It may also reduce your risk for type 2 diabetes, heart disease, and stroke. The DASH eating plan may also help with weight loss. What are tips for following this plan?  General guidelines  Avoid eating more than 2,300 mg (milligrams) of salt (sodium) a day. If you have hypertension, you may need to reduce your sodium intake to 1,500 mg a day.  Limit alcohol intake to no more than 1 drink a day for nonpregnant women and 2 drinks a day for men. One drink equals 12 oz of beer, 5 oz of wine, or 1 oz of hard liquor.  Work with your health care provider to maintain a healthy body weight or to lose weight. Ask what an ideal weight is for you.  Get at least 30 minutes of exercise that causes your heart to beat faster (aerobic exercise) most days of the week. Activities may include walking, swimming, or biking.  Work with your health care provider or diet and nutrition specialist (dietitian) to adjust your eating plan to your individual calorie needs. Reading food labels   Check food labels for the amount of sodium per serving. Choose foods with less than 5 percent of the Daily Value of sodium. Generally, foods with less than 300 mg of sodium per serving fit into this eating plan.  To find whole grains, look for the word "whole" as the first word in the ingredient list. Shopping  Buy products labeled as "low-sodium" or "no salt added."  Buy fresh foods. Avoid canned foods and premade or frozen meals. Cooking  Avoid adding salt when cooking. Use salt-free seasonings or herbs instead of table salt or sea salt. Check with your health care provider or pharmacist before using salt substitutes.  Do not fry foods. Cook foods using healthy methods such as baking, boiling, grilling, and  broiling instead.  Cook with heart-healthy oils, such as olive, canola, soybean, or sunflower oil. Meal planning  Eat a balanced diet that includes: ? 5 or more servings of fruits and vegetables each day. At each meal, try to fill half of your plate with fruits and vegetables. ? Up to 6-8 servings of whole grains each day. ? Less than 6 oz of lean meat, poultry, or fish each day. A 3-oz serving of meat is about the same size as a deck of cards. One egg equals 1 oz. ? 2 servings of low-fat dairy each day. ? A serving of nuts, seeds, or beans 5 times each week. ? Heart-healthy fats. Healthy fats called Omega-3 fatty acids are found in foods such as flaxseeds and coldwater fish, like sardines, salmon, and mackerel.  Limit how much you eat of the following: ? Canned or prepackaged foods. ? Food that is high in trans fat, such as fried foods. ? Food that is high in saturated fat, such as fatty meat. ? Sweets, desserts, sugary drinks, and other foods with added sugar. ? Full-fat dairy products.  Do not salt foods before eating.  Try to eat at least 2 vegetarian meals each week.  Eat more home-cooked food and less restaurant, buffet, and fast food.  When eating at a restaurant, ask that your food be prepared with less salt or no salt, if possible. What foods are recommended? The items listed may not be a complete list.  Talk with your dietitian about what dietary choices are best for you. Grains Whole-grain or whole-wheat bread. Whole-grain or whole-wheat pasta. Brown rice. [place]. Bulgur. Whole-grain and low-sodium cereals. Pita bread. Low-fat, low-sodium crackers. Whole-wheat flour tortillas. Vegetables Fresh or frozen vegetables (raw, steamed, roasted, or grilled). Low-sodium or reduced-sodium tomato and vegetable juice. Low-sodium or reduced-sodium tomato sauce and tomato paste. Low-sodium or reduced-sodium canned vegetables. Fruits All fresh, dried, or frozen fruit. Canned  fruit in natural juice (without added sugar). Meat and other protein foods Skinless chicken or [place]. Ground chicken or [place]. Pork with fat trimmed off. Fish and seafood. Egg whites. Dried beans, peas, or lentils. Unsalted nuts, nut butters, and seeds. Unsalted canned beans. Lean cuts of beef with fat trimmed off. Low-sodium, lean deli meat. Dairy Low-fat (1%) or fat-free (skim) milk. Fat-free, low-fat, or reduced-fat cheeses. Nonfat, low-sodium ricotta or cottage cheese. Low-fat or nonfat yogurt. Low-fat, low-sodium cheese. Fats and oils Soft margarine without trans fats. Vegetable oil. Low-fat, reduced-fat, or light mayonnaise and salad dressings (reduced-sodium). Canola, safflower, olive, soybean, and sunflower oils. Avocado. Seasoning and other foods Herbs. Spices. Seasoning mixes without salt. Unsalted popcorn and pretzels. Fat-free sweets. What foods are not recommended? The items listed may not be a complete list. Talk with your dietitian about what dietary choices are best for you. Grains Baked goods made with fat, such as croissants, muffins, or some breads. Dry pasta or rice meal packs. Vegetables Creamed or fried vegetables. Vegetables in a cheese sauce. Regular canned vegetables (not low-sodium or reduced-sodium). Regular canned tomato sauce and paste (not low-sodium or reduced-sodium). Regular tomato and vegetable juice (not low-sodium or reduced-sodium). Angie Fava. Olives. Fruits Canned fruit in a light or heavy syrup. Fried fruit. Fruit in cream or butter sauce. Meat and other protein foods Fatty cuts of meat. Ribs. Fried meat. Berniece Salines. Sausage. Bologna and other processed lunch meats. Salami. Fatback. Hotdogs. Bratwurst. Salted nuts and seeds. Canned beans with added salt. Canned or smoked fish. Whole eggs or egg yolks. Chicken or [place] with skin. Dairy Whole or 2% milk, cream, and half-and-half. Whole or full-fat cream cheese. Whole-fat or sweetened yogurt. Full-fat cheese.  Nondairy creamers. Whipped toppings. Processed cheese and cheese spreads. Fats and oils Butter. Stick margarine. Lard. Shortening. Ghee. Bacon fat. Tropical oils, such as coconut, palm kernel, or palm oil. Seasoning and other foods Salted popcorn and pretzels. Onion salt, garlic salt, seasoned salt, table salt, and sea salt. Worcestershire sauce. Tartar sauce. Barbecue sauce. Teriyaki sauce. Soy sauce, including reduced-sodium. Steak sauce. Canned and packaged gravies. Fish sauce. Oyster sauce. Cocktail sauce. Horseradish that you find on the shelf. Ketchup. Mustard. Meat flavorings and tenderizers. Bouillon cubes. Hot sauce and Tabasco sauce. Premade or packaged marinades. Premade or packaged taco seasonings. Relishes. Regular salad dressings. Where to find more information:  National Heart, Lung, and [place]: https://wilson-eaton.com/  American Heart Association: www.heart.org Summary  The DASH eating plan is a healthy eating plan that has been shown to reduce high blood pressure (hypertension). It may also reduce your risk for type 2 diabetes, heart disease, and stroke.  With the DASH eating plan, you should limit salt (sodium) intake to 2,300 mg a day. If you have hypertension, you may need to reduce your sodium intake to 1,500 mg a day.  When on the DASH eating plan, aim to eat more fresh fruits and vegetables, whole grains, lean proteins, low-fat dairy, and heart-healthy fats.  Work with your health care provider or diet and nutrition specialist (dietitian) to adjust  your eating plan to your individual calorie needs. This information is not intended to replace advice given to you by your health care provider. Make sure you discuss any questions you have with your health care provider. Document Revised: 06/26/2017 Document Reviewed: 07/07/2016 Elsevier Patient Education  2020 [employer].

## 2019-11-02 ENCOUNTER — Telehealth: Payer: Self-pay | Admitting: Physician Assistant

## 2019-11-02 NOTE — Telephone Encounter (Signed)
Pt saw jennifer on 10-24-2019 and was told to callback with BP reading. Pt is call to report his BP today is 120/64 . Pt is not having any symptoms please advice. Pt stated his bp was a little high in the office

## 2019-11-03 NOTE — Telephone Encounter (Signed)
That is perfect Thanks 

## 2019-11-10 ENCOUNTER — Other Ambulatory Visit: Payer: Self-pay | Admitting: Physician Assistant

## 2019-11-10 DIAGNOSIS — R600 Localized edema: Secondary | ICD-10-CM

## 2019-11-28 ENCOUNTER — Other Ambulatory Visit: Payer: Self-pay | Admitting: Physician Assistant

## 2019-11-28 DIAGNOSIS — I482 Chronic atrial fibrillation, unspecified: Secondary | ICD-10-CM

## 2019-11-28 DIAGNOSIS — E119 Type 2 diabetes mellitus without complications: Secondary | ICD-10-CM

## 2019-12-15 ENCOUNTER — Emergency Department (HOSPITAL_COMMUNITY): Payer: Medicare PPO

## 2019-12-15 ENCOUNTER — Encounter (HOSPITAL_COMMUNITY): Payer: Self-pay | Admitting: *Deleted

## 2019-12-15 ENCOUNTER — Other Ambulatory Visit: Payer: Self-pay

## 2019-12-15 ENCOUNTER — Emergency Department (HOSPITAL_COMMUNITY)
Admission: EM | Admit: 2019-12-15 | Discharge: 2019-12-15 | Disposition: A | Discharge status: Home / Self Care | Payer: Medicare PPO | Attending: Emergency Medicine | Admitting: Emergency Medicine

## 2019-12-15 DIAGNOSIS — E1122 Type 2 diabetes mellitus with diabetic chronic kidney disease: Secondary | ICD-10-CM | POA: Diagnosis not present

## 2019-12-15 DIAGNOSIS — Z7982 Long term (current) use of aspirin: Secondary | ICD-10-CM | POA: Insufficient documentation

## 2019-12-15 DIAGNOSIS — F17220 Nicotine dependence, chewing tobacco, uncomplicated: Secondary | ICD-10-CM | POA: Insufficient documentation

## 2019-12-15 DIAGNOSIS — Z951 Presence of aortocoronary bypass graft: Secondary | ICD-10-CM | POA: Diagnosis not present

## 2019-12-15 DIAGNOSIS — R55 Syncope and collapse: Secondary | ICD-10-CM | POA: Diagnosis present

## 2019-12-15 DIAGNOSIS — R7989 Other specified abnormal findings of blood chemistry: Secondary | ICD-10-CM

## 2019-12-15 DIAGNOSIS — Z79899 Other long term (current) drug therapy: Secondary | ICD-10-CM | POA: Insufficient documentation

## 2019-12-15 DIAGNOSIS — Z7984 Long term (current) use of oral hypoglycemic drugs: Secondary | ICD-10-CM | POA: Diagnosis not present

## 2019-12-15 DIAGNOSIS — N183 Chronic kidney disease, stage 3 unspecified: Secondary | ICD-10-CM | POA: Diagnosis not present

## 2019-12-15 DIAGNOSIS — I2510 Atherosclerotic heart disease of native coronary artery without angina pectoris: Secondary | ICD-10-CM | POA: Diagnosis not present

## 2019-12-15 DIAGNOSIS — I129 Hypertensive chronic kidney disease with stage 1 through stage 4 chronic kidney disease, or unspecified chronic kidney disease: Secondary | ICD-10-CM | POA: Insufficient documentation

## 2019-12-15 LAB — CBC WITH DIFFERENTIAL/PLATELET
Abs Immature Granulocytes: 0.05 10*3/uL (ref 0.00–0.07)
Basophils Absolute: 0.1 10*3/uL (ref 0.0–0.1)
Basophils Relative: 1 %
Eosinophils Absolute: 0.2 10*3/uL (ref 0.0–0.5)
Eosinophils Relative: 2 %
HCT: 35.3 % — ABNORMAL LOW (ref 39.0–52.0)
Hemoglobin: 11.1 g/dL — ABNORMAL LOW (ref 13.0–17.0)
Immature Granulocytes: 1 %
Lymphocytes Relative: 20 %
Lymphs Abs: 1.9 10*3/uL (ref 0.7–4.0)
MCH: 29.1 pg (ref 26.0–34.0)
MCHC: 31.4 g/dL (ref 30.0–36.0)
MCV: 92.4 fL (ref 80.0–100.0)
Monocytes Absolute: 0.5 10*3/uL (ref 0.1–1.0)
Monocytes Relative: 6 %
Neutro Abs: 6.9 10*3/uL (ref 1.7–7.7)
Neutrophils Relative %: 70 %
Platelets: 193 10*3/uL (ref 150–400)
RBC: 3.82 MIL/uL — ABNORMAL LOW (ref 4.22–5.81)
RDW: 13.6 % (ref 11.5–15.5)
WBC: 9.5 10*3/uL (ref 4.0–10.5)
nRBC: 0 % (ref 0.0–0.2)

## 2019-12-15 LAB — BASIC METABOLIC PANEL
Anion gap: 10 (ref 5–15)
BUN: 21 mg/dL (ref 8–23)
CO2: 22 mmol/L (ref 22–32)
Calcium: 8.7 mg/dL — ABNORMAL LOW (ref 8.9–10.3)
Chloride: 103 mmol/L (ref 98–111)
Creatinine, Ser: 1.46 mg/dL — ABNORMAL HIGH (ref 0.61–1.24)
GFR calc Af Amer: 53 mL/min — ABNORMAL LOW (ref >60)
GFR calc non Af Amer: 45 mL/min — ABNORMAL LOW (ref >60)
Glucose, Bld: 268 mg/dL — ABNORMAL HIGH (ref 70–99)
Potassium: 4.9 mmol/L (ref 3.5–5.1)
Sodium: 135 mmol/L (ref 135–145)

## 2019-12-15 MED ORDER — SODIUM CHLORIDE 0.9 % IV BOLUS
500.0000 mL | Freq: Once | INTRAVENOUS | Status: AC
  Administered 2019-12-15: 500 mL via INTRAVENOUS

## 2019-12-15 NOTE — ED Notes (Signed)
Discharge instructions discussed with pt. Pt verbalized understanding with no questions at this time. Pt to go home with son at bedside. Pt ambulatory at discharge

## 2019-12-15 NOTE — ED Notes (Signed)
Pt transported to CT ?

## 2019-12-15 NOTE — Discharge Instructions (Addendum)
You need to make an appointment to follow-up with your primary care doctor within the next few days.  Your creatinine was elevated and needs to be rechecked by your primary care doctor.  Make sure that you are staying hydrated and checking her blood sugar frequently.  Return here as needed if you have any worsening symptoms.

## 2019-12-15 NOTE — ED Provider Notes (Signed)
[facility] EMERGENCY DEPARTMENT Provider Note   CSN: [id number] Arrival date & time: 12/15/19  1629     History Chief Complaint  Patient presents with  . Near Syncope    Anes [name] Sr. is a 79 y.o. male.  Patient is a 79 year old male who presents after syncopal episode.  He has a history of coronary artery disease, chronic kidney disease, diabetes, hypertension, hyperlipidemia.  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  He got clammy and lightheaded and went into the house and checked his blood sugar and it was 43.  He ate a sandwich and drink some [employer].  He went back out after he was feeling better.  He said after an hour or so he felt himself get a little lightheaded and clammy again and he had a brief syncopal episode.  His son says that it was very brief and only lasted a few seconds.  He denies any palpitations.  No chest pain or shortness of breath.  No numbness or weakness to his extremities.  No speech deficits or vision changes.  He did not have any shaking spells with it.  He otherwise has not had any recent illnesses.  No history of similar symptoms in the past.  He felt back to baseline pretty much immediately and is completely asymptomatic currently.  He denies any injuries from the fall.  He said he landed on very soft plow dirt and does not have any neck, back pain or other injuries from the fall.        Past Medical History:  Diagnosis Date  . Arthritis   . Back pain    hx  . CAD (coronary artery disease)    a. 2004 s/p CABG x 4 (LIMA->LAD, VG->Diag, VG->OM, VG->RCA); b. 2007 Cath: VG->Diag occluded, otw 3/4 patent grafts.  . CKD (chronic kidney disease), stage II-III   . Colon polyps   . DM2 (diabetes mellitus, type 2) (HCC)    stable  . GERD (gastroesophageal reflux disease)   . Glaucoma   . Gout   . HLD (hyperlipidemia)   . HTN (hypertension)   . Neuropathy      Patient Active Problem List   Diagnosis Date Noted  . CKD (chronic kidney disease), stage II   . 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 Primary osteoarthritis of both knees 01/20/2018  . CAD (coronary artery disease), native coronary artery 08/04/2017  . Acute pain of left knee 06/12/2017  . Sebaceous cyst 09/10/2016  . Neuropathy 05/22/2016  . Abnormal EKG 11/26/2012  . Type 2 diabetes mellitus with complication, without long-term current use of insulin ([place]) 11/07/2009  . Mixed hyperlipidemia 11/07/2009  . GOUT, UNSPECIFIED 11/07/2009  . Essential hypertension 11/07/2009  . UNSTABLE ANGINA 11/07/2009  . 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 GERD 11/07/2009  . Backache 11/07/2009    Past Surgical History:  Procedure Laterality Date  . BACK SURGERY     Cyst removed  . CORONARY ARTERY BYPASS GRAFT  2004  . KNEE SURGERY         Family History  Family history unknown: Yes    Social History   Tobacco Use  . Smoking status: Never Smoker  . Smokeless tobacco: Current User    Types: Chew  . Tobacco comment: chew tobacco - 1 pack last 3 weeks  Substance Use Topics  . Alcohol use: No    Alcohol/week: 0.0  standard drinks  . Drug use: No    Home Medications Prior to Admission medications   Medication Sig Start Date End Date Taking? Authorizing Provider  allopurinol (ZYLOPRIM) 300 MG tablet TAKE 1 TABLET BY MOUTH ONCE DAILY Patient taking differently: Take 300 mg by mouth daily.  05/31/19  Yes Mar Daring, [name]  aspirin 81 MG chewable tablet Chew 81 mg by mouth daily.   Yes [provider]  atorvastatin (LIPITOR) 20 MG tablet TAKE 1 TABLET BY MOUTH ONCE DAILY Patient taking differently: Take 20 mg by mouth daily.  09/23/19   Minna Merritts, MD  BD SHARPS CONTAINER HOME MISC Dispose of syringes after B12 injections 10/19/17   Mar Daring, [name]  Blood Glucose Monitoring Suppl (ACCU-CHEK AVIVA PLUS) w/Device KIT To check blood  sugar once daily 06/11/17   Fenton Malling M, [name]  carvedilol (COREG) 3.125 MG tablet Take 1 tablet (3.125 mg total) by mouth 2 (two) times daily. 08/02/19 10/31/19  Minna Merritts, MD  digoxin (LANOXIN) 0.125 MG tablet TAKE 1 TABLET BY MOUTH ONCE DAILY 11/28/19   Mar Daring, [name]  furosemide (LASIX) 20 MG tablet Take 1 tablet (20 mg total) by mouth as directed. Take 20 mg twice a day 3 days a week and then once daily on other days. 08/10/19 11/08/19  Minna Merritts, MD  gabapentin (NEURONTIN) 400 MG capsule TAKE 1 CAPSULE BY MOUTH 3 TIMES DAILY 02/07/19   Fenton Malling M, [name]  glipiZIDE (GLUCOTROL XL) 10 MG 24 hr tablet TAKE 1 TABLET BY MOUTH ONCE DAILY WITH BREAKFAST 07/12/19   Fenton Malling M, [name]  glucose blood (ACCU-CHEK AVIVA PLUS) test strip USE AS DIRECTED TO CHECK FASTING BLOOD SUGAR EVERY MORNING 08/29/19   Mar Daring, [name]  lisinopril (ZESTRIL) 20 MG tablet TAKE 1 TABLET BY MOUTH ONCE DAILY 03/31/19   Mar Daring, [name]  meloxicam (MOBIC) 7.5 MG tablet TAKE 1 TABLET BY MOUTH TWICE (2) DAILY 10/14/19   Burnette, Clearnce Sorrel, [name]  metFORMIN (GLUCOPHAGE) 1000 MG tablet TAKE 1 TABLET BY MOUTH 2 TIMES DAILY WITH A MEAL 02/21/19   Fenton Malling M, [name]  nitroGLYCERIN (NITROSTAT) 0.4 MG SL tablet Place 1 tablet (0.4 mg total) under the tongue every 5 (five) minutes as needed for chest pain. 08/01/19   Minna Merritts, MD  ONGLYZA 5 MG TABS tablet TAKE 1 TABLET BY MOUTH ONCE A DAY 11/28/19   Mar Daring, [name]  potassium chloride (KLOR-CON) 10 MEQ tablet TAKE 1 TABLET BY MOUTH ONCE DAILY 11/10/19   Mar Daring, [name]  sildenafil (REVATIO) 20 MG tablet Take 1 tablet (20 mg total) by mouth daily as needed. 10/24/19   Mar Daring, [name]  SYRINGE-NEEDLE, DISP, 3 ML (B-D SYRINGE/NEEDLE 3CC/25GX5/8) 25G X 5/8" 3 ML MISC For use with cyanocobalamin injections 10/19/17   Mar Daring, [name]  tamsulosin (FLOMAX) 0.4 MG CAPS capsule TAKE 1  CAPSULE BY MOUTH ONCE DAILY 09/23/19   Mar Daring, [name]  allopurinol (ZYLOPRIM) 300 MG tablet TAKE 1 TABLET BY MOUTH ONCE A DAY 11/17/18   Mar Daring, [name]    Allergies    Ramipril  Review of Systems   Review of Systems  Constitutional: Positive for diaphoresis. Negative for chills, fatigue and fever.  HENT: Negative for congestion, rhinorrhea and sneezing.   Eyes: Negative.   Respiratory: Negative for cough, chest tightness and shortness of breath.   Cardiovascular: Negative for chest pain and leg swelling.  Gastrointestinal:  Negative for abdominal pain, blood in stool, diarrhea, nausea and vomiting.  Genitourinary: Negative for difficulty urinating, flank pain, frequency and hematuria.  Musculoskeletal: Negative for arthralgias and back pain.  Skin: Negative for rash.  Neurological: Positive for syncope and light-headedness. Negative for dizziness, speech difficulty, weakness, numbness and headaches.    Physical Exam Updated Vital Signs BP (!) 147/67   Pulse (!) 59   Temp 97.6 F (36.4 C) (Oral)   Resp 19   Ht '5\' 10"'  (1.778 m)   Wt 80.7 kg   SpO2 97%   BMI 25.54 kg/m   Physical Exam Constitutional:      Appearance: He is well-developed.  HENT:     Head: Normocephalic and atraumatic.  Eyes:     Pupils: Pupils are equal, round, and reactive to light.  Neck:     Comments: No pain along the cervical, thoracic or lumbosacral spine Cardiovascular:     Rate and Rhythm: Normal rate and regular rhythm.     Heart sounds: Normal heart sounds.  Pulmonary:     Effort: Pulmonary effort is normal. No respiratory distress.     Breath sounds: Normal breath sounds. No wheezing or rales.  Chest:     Chest wall: No tenderness.  Abdominal:     General: Bowel sounds are normal.     Palpations: Abdomen is soft.     Tenderness: There is no abdominal tenderness. There is no guarding or rebound.  Musculoskeletal:        General: Normal range of motion.     Cervical  back: Normal range of motion and neck supple.     Comments: No pain on palpation or range of motion of the extremities  Lymphadenopathy:     Cervical: No cervical adenopathy.  Skin:    General: Skin is warm and dry.     Findings: No rash.  Neurological:     Mental Status: He is alert and oriented to person, place, and time.     Comments: Motor 5/5 all extremities Sensation grossly intact to LT all extremities Finger to Nose intact, no pronator drift CN II-XII grossly intact Gait normal      ED Results / Procedures / Treatments   Labs (all labs ordered are listed, but only abnormal results are displayed) Labs Reviewed  BASIC METABOLIC PANEL - Abnormal; Notable for the following components:      Result Value   Glucose, Bld 268 (*)    Creatinine, Ser 1.46 (*)    Calcium 8.7 (*)    GFR calc non Af Amer 45 (*)    GFR calc Af Amer 53 (*)    All other components within normal limits  CBC WITH DIFFERENTIAL/PLATELET - Abnormal; Notable for the following components:   RBC 3.82 (*)    Hemoglobin 11.1 (*)    HCT 35.3 (*)    All other components within normal limits    EKG EKG Interpretation  Date/Time:  Thursday Dec 15 2019 16:40:37 EDT Ventricular Rate:  69 PR Interval:    QRS Duration: 137 QT Interval:  402 QTC Calculation: 431 R Axis:   71 Text Interpretation: Sinus rhythm Ventricular premature complex Right bundle branch block RBBB new as compared to EKG from 2007 Confirmed by Malvin Johns [phone]) on 12/15/2019 4:44:57 PM   Radiology CT Head Wo Contrast  Result Date: 12/15/2019 CLINICAL DATA:  Headache EXAM: CT HEAD WITHOUT CONTRAST TECHNIQUE: Contiguous axial images were obtained from the base of the skull through the vertex without  intravenous contrast. COMPARISON:  None. FINDINGS: Brain: No acute territorial infarction, hemorrhage, or intracranial mass is visualized. Mild atrophy. Mild hypodensity in the white matter consistent with chronic small vessel ischemic  change. Nonenlarged ventricles. Vascular: No hyperdense vessels. Vertebral and carotid vascular calcification Skull: Normal. Negative for fracture or focal lesion. Sinuses/Orbits: No acute finding. Other: None IMPRESSION: 1. No CT evidence for acute intracranial abnormality. 2. Atrophy and mild chronic small vessel ischemic change of the white matter Electronically Signed   By: Donavan Foil M.D.   On: 12/15/2019 20:48   CT Cervical Spine Wo Contrast  Result Date: 12/15/2019 CLINICAL DATA:  79 year old male with syncope, headache. EXAM: CT CERVICAL SPINE WITHOUT CONTRAST TECHNIQUE: Multidetector CT imaging of the cervical spine was performed without intravenous contrast. Multiplanar CT image reconstructions were also generated. COMPARISON:  Head CT today reported separately. FINDINGS: Alignment: Straightening of cervical lordosis with mild degenerative appearing anterolisthesis at C3-C4, associated with left side chronic facet degeneration. Cervicothoracic junction alignment is within normal limits. Bilateral posterior element alignment is within normal limits. Skull base and vertebrae: Visualized skull base is intact. No atlanto-occipital dissociation. C1 and C2 appear intact and normally aligned. No acute osseous abnormality identified. Soft tissues and spinal canal: No prevertebral fluid or swelling. No visible canal hematoma. Severe bilateral cervical carotid and distal vertebral artery calcified atherosclerosis. Disc levels: Advanced cervical facet arthropathy is widespread on the left. Vacuum facet at C2-C3 and C3-C4. Chronic disc and endplate degeneration maximal at C5-C6. Multilevel mild degenerative spinal stenosis. Upper chest: Mild chronic appearing T2 and T3 superior endplate compression. Other visible upper thoracic levels appear intact. Partially visible sternotomy. Negative lung apices. IMPRESSION: 1. No acute traumatic injury identified in the cervical spine. 2. Mild superior endplate compression  at T2 and T3 is age indeterminate but probably chronic. 3. 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 4. Severe calcified atherosclerosis of the cervical carotids and distal vertebral arteries. Electronically Signed   By: Genevie Ann M.D.   On: 12/15/2019 21:18    Procedures Procedures (including critical care time)  Medications Ordered in ED Medications  sodium chloride 0.9 % bolus 500 mL (0 mLs Intravenous Stopped 12/15/19 2005)    ED Course  I have reviewed the triage vital signs and the nursing notes.  Pertinent labs & imaging results that were available during my care of the patient were reviewed by me and considered in my medical decision making (see chart for details).    MDM Rules/Calculators/A&P                      Patient is a 79 year old male who presents after syncopal event.  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  He had preceding episodes of lightheadedness and clamminess.  He does not have any unilateral deficits that would be more concerning for stroke.  He did not have any palpitations or chest pain that would be more concerning for an arrhythmia.  I have not noted any arrhythmias during the ED course.  His EKG shows a sinus rhythm without ischemic changes.  There is a right bundle branch block present.  This was not present on his prior EKG which was from 2007.  His labs show mild elevation in his creatinine his glucose is mildly elevated.  No signs of DKA.  No significant anemia.  His potassium is normal.  He was given some IV fluids in the ED.  He is able  to ambulate without any symptoms.  He was discharged home in good condition.  He was encouraged to have close follow-up with his PCP.  He was advised that he will need to have his creatinine rechecked.  Return precautions were given. Final Clinical Impression(s) / ED Diagnoses Final diagnoses:  Syncope and collapse  Elevated serum creatinine    Rx / DC  Orders ED Discharge Orders    None       Malvin Johns, MD 12/15/19 2258

## 2019-12-15 NOTE — ED Notes (Signed)
Pt is able to tolerate PO fluids.

## 2019-12-15 NOTE — ED Notes (Signed)
Pt was able to ambulate independently in the hall. Gait stable.

## 2019-12-15 NOTE — ED Triage Notes (Signed)
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 Patient is currently alert oriented. Left pupil is 4 and reactive , right pupil is 2 and reactive states he has had cataract surgery on both eyes c/o headache.

## 2019-12-20 ENCOUNTER — Other Ambulatory Visit: Payer: Self-pay | Admitting: Physician Assistant

## 2019-12-20 DIAGNOSIS — M1A9XX0 Chronic gout, unspecified, without tophus (tophi): Secondary | ICD-10-CM

## 2019-12-22 ENCOUNTER — Encounter: Payer: Self-pay | Admitting: Physician Assistant

## 2019-12-22 ENCOUNTER — Ambulatory Visit (INDEPENDENT_AMBULATORY_CARE_PROVIDER_SITE_OTHER): Payer: Medicare PPO | Admitting: Physician Assistant

## 2019-12-22 ENCOUNTER — Other Ambulatory Visit: Payer: Self-pay

## 2019-12-22 VITALS — BP 127/57 | HR 56 | Temp 97.3°F | Resp 16 | Wt 172.4 lb

## 2019-12-22 DIAGNOSIS — I4510 Unspecified right bundle-branch block: Secondary | ICD-10-CM | POA: Diagnosis not present

## 2019-12-22 DIAGNOSIS — N182 Chronic kidney disease, stage 2 (mild): Secondary | ICD-10-CM

## 2019-12-22 NOTE — Progress Notes (Signed)
Established patient visit   Patient: [name] Weidler Sr.   DOB: [birth year]   79 y.o. Male  MRN: [id number] Visit Date: 12/22/2019  Today's healthcare provider: Mar Daring, [name]   Chief Complaint  Patient presents with  . Hospitalization Follow-up   Subjective    HPI  Follow up ER visit  Patient was seen in ER for syncope and collapse and elevated serum creatinine on 12/15/19. Treatment for this included He was given some IV fluids in the ED,Per notes  EKG showed a sinus rhythm without ischemic changes.There is a right bundle branch block present.  This was not present on his prior EKG which was from 2007.  He reports excellent compliance with treatment. He reports this condition is Improved. Was felt to be more hypoglycemic and dehydration. Patient reports he had been very active that day and that it was very hot. He reports he had been sweating a lot.  -----------------------------------------------------------------------------------------    Patient Active Problem List   Diagnosis Date Noted  . CKD (chronic kidney disease), stage II   . 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 Primary osteoarthritis of both knees 01/20/2018  . CAD (coronary artery disease), native coronary artery 08/04/2017  . Acute pain of left knee 06/12/2017  . Sebaceous cyst 09/10/2016  . Neuropathy 05/22/2016  . Abnormal EKG 11/26/2012  . Type 2 diabetes mellitus with complication, without long-term current use of insulin ([place]) 11/07/2009  . Mixed hyperlipidemia 11/07/2009  . GOUT, UNSPECIFIED 11/07/2009  . Essential hypertension 11/07/2009  . UNSTABLE ANGINA 11/07/2009  . 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 GERD 11/07/2009  . Backache 11/07/2009   Past Medical History:  Diagnosis Date  . Arthritis   . Back pain    hx  . CAD (coronary artery disease)    a. 2004 s/p CABG x 4 (LIMA->LAD, VG->Diag, VG->OM, VG->RCA); b. 2007 Cath: VG->Diag occluded, otw 3/4  patent grafts.  . CKD (chronic kidney disease), stage II-III   . Colon polyps   . DM2 (diabetes mellitus, type 2) (HCC)    stable  . GERD (gastroesophageal reflux disease)   . Glaucoma   . Gout   . HLD (hyperlipidemia)   . HTN (hypertension)   . Neuropathy        Medications: Outpatient Medications Prior to Visit  Medication Sig  . allopurinol (ZYLOPRIM) 300 MG tablet TAKE 1 TABLET BY MOUTH ONCE DAILY  . aspirin 81 MG chewable tablet Chew 81 mg by mouth daily.  Marland Kitchen atorvastatin (LIPITOR) 20 MG tablet TAKE 1 TABLET BY MOUTH ONCE DAILY (Patient taking differently: Take 20 mg by mouth daily. )  . BD SHARPS CONTAINER HOME MISC Dispose of syringes after B12 injections  . Blood Glucose Monitoring Suppl (ACCU-CHEK AVIVA PLUS) w/Device KIT To check blood sugar once daily  . digoxin (LANOXIN) 0.125 MG tablet TAKE 1 TABLET BY MOUTH ONCE DAILY  . furosemide (LASIX) 20 MG tablet Take 1 tablet (20 mg total) by mouth as directed. Take 20 mg twice a day 3 days a week and then once daily on other days.  Marland Kitchen gabapentin (NEURONTIN) 400 MG capsule TAKE 1 CAPSULE BY MOUTH 3 TIMES DAILY  . glipiZIDE (GLUCOTROL XL) 10 MG 24 hr tablet TAKE 1 TABLET BY MOUTH ONCE DAILY WITH BREAKFAST  . glucose blood (ACCU-CHEK AVIVA PLUS) test strip USE AS DIRECTED TO CHECK FASTING BLOOD SUGAR EVERY MORNING  . lisinopril (ZESTRIL) 20 MG tablet TAKE 1 TABLET BY MOUTH ONCE DAILY  .  meloxicam (MOBIC) 7.5 MG tablet TAKE 1 TABLET BY MOUTH TWICE (2) DAILY  . metFORMIN (GLUCOPHAGE) 1000 MG tablet TAKE 1 TABLET BY MOUTH 2 TIMES DAILY WITH A MEAL  . nitroGLYCERIN (NITROSTAT) 0.4 MG SL tablet Place 1 tablet (0.4 mg total) under the tongue every 5 (five) minutes as needed for chest pain.  . ONGLYZA 5 MG TABS tablet TAKE 1 TABLET BY MOUTH ONCE A DAY  . potassium chloride (KLOR-CON) 10 MEQ tablet TAKE 1 TABLET BY MOUTH ONCE DAILY  . sildenafil (REVATIO) 20 MG tablet Take 1 tablet (20 mg total) by mouth daily as needed.  . SYRINGE-NEEDLE,  DISP, 3 ML (B-D SYRINGE/NEEDLE 3CC/25GX5/8) 25G X 5/8" 3 ML MISC For use with cyanocobalamin injections  . tamsulosin (FLOMAX) 0.4 MG CAPS capsule TAKE 1 CAPSULE BY MOUTH ONCE DAILY  . carvedilol (COREG) 3.125 MG tablet Take 1 tablet (3.125 mg total) by mouth 2 (two) times daily.   No facility-administered medications prior to visit.    Review of Systems  Constitutional: Negative.   Respiratory: Negative.   Cardiovascular: Negative.   Neurological: Negative.   Psychiatric/Behavioral: Negative.     Last CBC Lab Results  Component Value Date   WBC 9.5 12/15/2019   HGB 11.1 (L) 12/15/2019   HCT 35.3 (L) 12/15/2019   MCV 92.4 12/15/2019   MCH 29.1 12/15/2019   RDW 13.6 12/15/2019   PLT 193 52/84/1324   Last metabolic panel Lab Results  Component Value Date   GLUCOSE 268 (H) 12/15/2019   NA 135 12/15/2019   K 4.9 12/15/2019   CL 103 12/15/2019   CO2 22 12/15/2019   BUN 21 12/15/2019   CREATININE 1.46 (H) 12/15/2019   GFRNONAA 45 (L) 12/15/2019   GFRAA 53 (L) 12/15/2019   CALCIUM 8.7 (L) 12/15/2019   PROT 6.1 04/22/2019   ALBUMIN 4.3 04/22/2019   LABGLOB 1.8 04/22/2019   AGRATIO 2.4 (H) 04/22/2019   BILITOT 0.3 04/22/2019   ALKPHOS 60 04/22/2019   AST 16 04/22/2019   ALT 22 04/22/2019   ANIONGAP 10 12/15/2019   Last hemoglobin A1c Lab Results  Component Value Date   HGBA1C 7.0 (A) 10/24/2019      Objective    BP (!) 127/57 (BP Location: Right Arm, Patient Position: Sitting, Cuff Size: Normal)   Pulse (!) 56   Temp (!) 97.3 F (36.3 C) (Temporal)   Resp 16   Wt 172 lb 6.4 oz (78.2 kg)   BMI 24.74 kg/m  BP Readings from Last 3 Encounters:  12/22/19 (!) 127/57  12/15/19 (!) 153/71  10/24/19 (!) 178/77   Wt Readings from Last 3 Encounters:  12/22/19 172 lb 6.4 oz (78.2 kg)  12/15/19 178 lb (80.7 kg)  10/24/19 179 lb 6.4 oz (81.4 kg)      Physical Exam Vitals reviewed.  Constitutional:      General: He is not in acute distress.    Appearance:  Normal appearance. He is well-developed and normal weight. He is not ill-appearing or diaphoretic.  HENT:     Head: Normocephalic and atraumatic.  Neck:     Thyroid: No thyromegaly.     Vascular: No JVD.     Trachea: No tracheal deviation.  Cardiovascular:     Rate and Rhythm: Normal rate and regular rhythm.     Heart sounds: Normal heart sounds. No murmur. No friction rub. No gallop.   Pulmonary:     Effort: Pulmonary effort is normal. No respiratory distress.     Breath  sounds: Normal breath sounds. No wheezing or rales.  Musculoskeletal:     Cervical back: Normal range of motion and neck supple.     Right lower leg: No edema.     Left lower leg: No edema.  Lymphadenopathy:     Cervical: No cervical adenopathy.  Neurological:     Mental Status: He is alert.  Psychiatric:        Mood and Affect: Mood normal.        Thought Content: Thought content normal.      No results found for any visits on 12/22/19.  Assessment & Plan     1. CKD (chronic kidney disease), stage II Suspected AKI due to dehydration. Will recheck labs now since has been hydrating and feeling better. - Basic Metabolic Panel (BMET)  2. RBBB New finding on EKG. Currently asymptomatic. Followed by Cardiology.  Notes, labs and EKG from ER personally reviewed by me prior to patient arrival for appt today.  No follow-ups on file.      Reynolds Bowl, [name], have reviewed all documentation for this visit. The documentation on 12/27/19 for the exam, diagnosis, procedures, and orders are all accurate and complete.   Rubye Beach  [facility] [phone] (phone) [phone] (fax)  [place]

## 2019-12-27 ENCOUNTER — Encounter: Payer: Self-pay | Admitting: Physician Assistant

## 2019-12-27 DIAGNOSIS — I4510 Unspecified right bundle-branch block: Secondary | ICD-10-CM | POA: Insufficient documentation

## 2019-12-28 ENCOUNTER — Other Ambulatory Visit: Payer: Self-pay | Admitting: Physician Assistant

## 2019-12-28 ENCOUNTER — Telehealth: Payer: Self-pay

## 2019-12-28 DIAGNOSIS — E119 Type 2 diabetes mellitus without complications: Secondary | ICD-10-CM

## 2019-12-28 DIAGNOSIS — G629 Polyneuropathy, unspecified: Secondary | ICD-10-CM

## 2019-12-28 LAB — BASIC METABOLIC PANEL
BUN/Creatinine Ratio: 13 (ref 10–24)
BUN: 14 mg/dL (ref 8–27)
CO2: 23 mmol/L (ref 20–29)
Calcium: 9.7 mg/dL (ref 8.6–10.2)
Chloride: 99 mmol/L (ref 96–106)
Creatinine, Ser: 1.08 mg/dL (ref 0.76–1.27)
GFR calc Af Amer: 76 mL/min/{1.73_m2} (ref >59)
GFR calc non Af Amer: 65 mL/min/{1.73_m2} (ref >59)
Glucose: 219 mg/dL — ABNORMAL HIGH (ref 65–99)
Potassium: 4.7 mmol/L (ref 3.5–5.2)
Sodium: 138 mmol/L (ref 134–144)

## 2019-12-28 NOTE — Telephone Encounter (Signed)
-----   Message from Margaretann Loveless, [name] sent at 12/28/2019  8:18 AM EDT ----- Kidney function now completely back to normal range.

## 2019-12-28 NOTE — Telephone Encounter (Signed)
Pt advised.   Thanks,   -Adreanne Yono  

## 2020-01-19 ENCOUNTER — Other Ambulatory Visit: Payer: Self-pay | Admitting: Physician Assistant

## 2020-01-19 DIAGNOSIS — N401 Enlarged prostate with lower urinary tract symptoms: Secondary | ICD-10-CM

## 2020-01-24 ENCOUNTER — Other Ambulatory Visit: Payer: Self-pay | Admitting: Physician Assistant

## 2020-01-24 DIAGNOSIS — M1A9XX0 Chronic gout, unspecified, without tophus (tophi): Secondary | ICD-10-CM

## 2020-01-24 DIAGNOSIS — E119 Type 2 diabetes mellitus without complications: Secondary | ICD-10-CM

## 2020-01-24 NOTE — Telephone Encounter (Signed)
Requested medication (s) are due for refill today: yes  Requested medication (s) are on the active medication list: yes  Last refill:  12/20/19  Future visit scheduled: yes  Notes to clinic:  insurance requesting alternative medication    Requested Prescriptions  Pending Prescriptions Disp Refills   allopurinol (ZYLOPRIM) 300 MG tablet [Pharmacy Med Name: ALLOPURINOL 300 MG TAB] 30 tablet 0    Sig: TAKE 1 TABLET BY MOUTH ONCE DAILY      Endocrinology:  Gout Agents Failed - 01/24/2020  3:01 PM      Failed - Uric Acid in normal range and within 360 days    No results found for: POCURA, LABURIC        Passed - Cr in normal range and within 360 days    Creat  Date Value Ref Range Status  06/12/2017 1.43 (H) 0.70 - 1.18 mg/dL Final    Comment:    For patients >46 years of age, the reference limit for Creatinine is approximately 13% higher for people identified as African-American. .    Creatinine, Ser  Date Value Ref Range Status  12/27/2019 1.08 0.76 - 1.27 mg/dL Final          Passed - Valid encounter within last 12 months    Recent Outpatient Visits           1 month ago CKD (chronic kidney disease), stage II   [facility] [place], [place], [name]   3 months ago Type 2 diabetes mellitus with stage 2 chronic kidney disease, without long-term current use of insulin (HCC)   [facility] [place], [place], [name]   5 months ago Suspected COVID-19 virus infection   [facility], [place], [name]   6 months ago Type 2 diabetes mellitus with stage 2 chronic kidney disease, without long-term current use of insulin [facility])   [facility] [place], Victorino Dike M, [place]   9 months ago Medicare annual wellness visit, subsequent   [employer], [place], [name]       Future Appointments             In 2 days Rosezetta Schlatter, Alessandra Bevels, [name] [name]   In 1 week Gollan,  Tollie Pizza, MD [facility] [place], [facility]             Refused Prescriptions Disp Refills   ONGLYZA 5 MG TABS tablet [Pharmacy Med Name: ONGLYZA 5 MG TAB] 90 tablet 0    Sig: TAKE 1 TABLET BY MOUTH ONCE DAILY      Endocrinology:  Diabetes - DPP-4 Inhibitors Passed - 01/24/2020  3:01 PM      Passed - HBA1C is between 0 and 7.9 and within 180 days    Hemoglobin A1C  Date Value Ref Range Status  10/24/2019 7.0 (A) 4.0 - 5.6 % Final   Hgb A1c MFr Bld  Date Value Ref Range Status  04/22/2019 7.8 (H) 4.8 - 5.6 % Final    Comment:             Prediabetes: 5.7 - 6.4          Diabetes: >6.4          Glycemic control for adults with diabetes: <7.0           Passed - Cr in normal range and within 360 days    Creat  Date Value Ref Range Status  06/12/2017 1.43 (H) 0.70 - 1.18 mg/dL Final  Comment:    For patients >74 years of age, the reference limit for Creatinine is approximately 13% higher for people identified as African-American. .    Creatinine, Ser  Date Value Ref Range Status  12/27/2019 1.08 0.76 - 1.27 mg/dL Final          Passed - Valid encounter within last 6 months    Recent Outpatient Visits           1 month ago CKD (chronic kidney disease), stage II   [facility] [place], [place], [name]   3 months ago Type 2 diabetes mellitus with stage 2 chronic kidney disease, without long-term current use of insulin [facility])   [facility] [place], [place], [place]   5 months ago Suspected COVID-19 virus infection   [facility], [place], [place]   6 months ago Type 2 diabetes mellitus with stage 2 chronic kidney disease, without long-term current use of insulin [facility])   [facility] [place], Alessandra Bevels, [place]   9 months ago Medicare annual wellness visit, subsequent   [employer], Alessandra Bevels, [name]       Future Appointments             In 2 days Rosezetta Schlatter,  Alessandra Bevels, [name] [name]   In 1 week Mariah Milling, Tollie Pizza, MD [facility], [facility]

## 2020-01-24 NOTE — Progress Notes (Signed)
Established patient visit   Patient: [name] Meacham Sr.   DOB: August 23, 1940   79 y.o. Male  MRN: [id number] Visit Date: 01/26/2020  Today's healthcare provider: Mar Daring, [name]   Chief Complaint  Patient presents with  . Follow-up    T2DM   Subjective    HPI  Diabetes Mellitus Type II, Follow-up  Lab Results  Component Value Date   HGBA1C 7.8 (A) 01/26/2020   HGBA1C 7.0 (A) 10/24/2019   HGBA1C 7.5 (A) 07/25/2019   Wt Readings from Last 3 Encounters:  01/26/20 179 lb (81.2 kg)  12/22/19 172 lb 6.4 oz (78.2 kg)  12/15/19 178 lb (80.7 kg)   Last seen for diabetes 3 months ago.  Management since then includes Continue Glipizide XR 50m daily, metformin 10033mBID. He reports excellent compliance with treatment. He is not having side effects. none Symptoms: No fatigue No foot ulcerations  No appetite changes No nausea  No paresthesia of the feet  No polydipsia  No polyuria No visual disturbances   No vomiting     Home blood sugar records: fasting range: 110-130  Episodes of hypoglycemia? No    Current insulin regiment: none Most Recent Eye Exam: is going to call to schedule an appointment.  Pertinent Labs: Lab Results  Component Value Date   CHOL 119 04/22/2019   HDL 38 (L) 04/22/2019   LDLCALC 69 04/22/2019   TRIG 52 04/22/2019   CHOLHDL 3.1 04/22/2019   Lab Results  Component Value Date   NA 138 12/27/2019   K 4.7 12/27/2019   CREATININE 1.08 12/27/2019   GFRNONAA 65 12/27/2019   GFRAA 76 12/27/2019   GLUCOSE 219 (H) 12/27/2019     --------------------------------------------------------------------------------------------------- Hypertension, follow-up  BP Readings from Last 3 Encounters:  01/26/20 (!) 177/68  12/22/19 (!) 127/57  12/15/19 (!) 153/71   Wt Readings from Last 3 Encounters:  01/26/20 179 lb (81.2 kg)  12/22/19 172 lb 6.4 oz (78.2 kg)  12/15/19 178 lb (80.7 kg)     He was last seen for hypertension 3  months ago.  BP at that visit was 178/77. Management since that visit includes Continue Carvedilol 3.12544mID, Lisinopril 41m53mily. Will check BP at home this week and call by Friday if readings remain high. If high, will increase Lisinopril to 40mg32me reports excellent compliance with treatment. He is not having side effects.  He does not smoke.   Outside blood pressures are not being checked. Symptoms: Yes chest pain No chest pressure  No palpitations No syncope  No dyspnea No orthopnea  No paroxysmal nocturnal dyspnea No lower extremity edema   Pertinent labs: Lab Results  Component Value Date   CHOL 119 04/22/2019   HDL 38 (L) 04/22/2019   LDLCALC 69 04/22/2019   TRIG 52 04/22/2019   CHOLHDL 3.1 04/22/2019   Lab Results  Component Value Date   NA 138 12/27/2019   K 4.7 12/27/2019   CREATININE 1.08 12/27/2019   GFRNONAA 65 12/27/2019   GFRAA 76 12/27/2019   GLUCOSE 219 (H) 12/27/2019     The ASCVD Risk score (Goff DC Jr., et al., 2013) failed to calculate for the following reasons:   The valid total cholesterol range is 130 to 320 mg/dL   ---------------------------------------------------------------------------------------------------  Patient Active Problem List   Diagnosis Date Noted  . RBBB 12/27/2019  . CKD (chronic kidney disease), stage II   . 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 Primary osteoarthritis of both knees  01/20/2018  . CAD (coronary artery disease), native coronary artery 08/04/2017  . Acute pain of left knee 06/12/2017  . Sebaceous cyst 09/10/2016  . Neuropathy 05/22/2016  . Abnormal EKG 11/26/2012  . Type 2 diabetes mellitus with complication, without long-term current use of insulin ([place]) 11/07/2009  . Mixed hyperlipidemia 11/07/2009  . GOUT, UNSPECIFIED 11/07/2009  . Essential hypertension 11/07/2009  . UNSTABLE ANGINA 11/07/2009  . 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 GERD 11/07/2009  . Backache 11/07/2009    Social History   Tobacco Use  . Smoking status: Never Smoker  . Smokeless tobacco: Current User    Types: Chew  . Tobacco comment: chew tobacco - 1 pack last 3 weeks  Vaping Use  . Vaping Use: Never used  Substance Use Topics  . Alcohol use: No    Alcohol/week: 0.0 standard drinks  . Drug use: No       Medications: Outpatient Medications Prior to Visit  Medication Sig  . allopurinol (ZYLOPRIM) 300 MG tablet TAKE 1 TABLET BY MOUTH ONCE DAILY  . aspirin 81 MG chewable tablet Chew 81 mg by mouth daily.  Marland Kitchen atorvastatin (LIPITOR) 20 MG tablet TAKE 1 TABLET BY MOUTH ONCE DAILY (Patient taking differently: Take 20 mg by mouth daily. )  . BD SHARPS CONTAINER HOME MISC Dispose of syringes after B12 injections  . Blood Glucose Monitoring Suppl (ACCU-CHEK AVIVA PLUS) w/Device KIT To check blood sugar once daily  . digoxin (LANOXIN) 0.125 MG tablet TAKE 1 TABLET BY MOUTH ONCE DAILY  . gabapentin (NEURONTIN) 400 MG capsule TAKE ONE CAPSULE BY MOUTH THREE TIMES DAILY  . glipiZIDE (GLUCOTROL XL) 10 MG 24 hr tablet TAKE 1 TABLET BY MOUTH ONCE DAILY WITH BREAKFAST  . glucose blood (ACCU-CHEK AVIVA PLUS) test strip USE AS DIRECTED TO CHECK FASTING BLOOD SUGAR EVERY MORNING  . lisinopril (ZESTRIL) 20 MG tablet TAKE 1 TABLET BY MOUTH ONCE DAILY  . meloxicam (MOBIC) 7.5 MG tablet TAKE 1 TABLET BY MOUTH TWICE (2) DAILY  . metFORMIN (GLUCOPHAGE) 1000 MG tablet TAKE 1 TABLET BY MOUTH TWICE (2) DAILY WITH A MEAL  . nitroGLYCERIN (NITROSTAT) 0.4 MG SL tablet Place 1 tablet (0.4 mg total) under the tongue every 5 (five) minutes as needed for chest pain.  . potassium chloride (KLOR-CON) 10 MEQ tablet TAKE 1 TABLET BY MOUTH ONCE DAILY  . sildenafil (REVATIO) 20 MG tablet Take 1 tablet (20 mg total) by mouth daily as needed.  . SYRINGE-NEEDLE, DISP, 3 ML (B-D SYRINGE/NEEDLE 3CC/25GX5/8) 25G X 5/8" 3 ML MISC For use with cyanocobalamin injections  . tamsulosin (FLOMAX) 0.4 MG CAPS capsule TAKE 1 CAPSULE BY  MOUTH ONCE DAILY  . [DISCONTINUED] ONGLYZA 5 MG TABS tablet TAKE 1 TABLET BY MOUTH ONCE A DAY  . carvedilol (COREG) 3.125 MG tablet Take 1 tablet (3.125 mg total) by mouth 2 (two) times daily.  . furosemide (LASIX) 20 MG tablet Take 1 tablet (20 mg total) by mouth as directed. Take 20 mg twice a day 3 days a week and then once daily on other days.   No facility-administered medications prior to visit.    Review of Systems  Constitutional: Negative.   Respiratory: Negative.   Cardiovascular: Positive for chest pain (off an on has an appointment with cardio on 02/06/20). Negative for palpitations and leg swelling.  Gastrointestinal: Negative.   Endocrine: Negative for polydipsia, polyphagia and polyuria.  Neurological: Negative.     Last CBC Lab Results  Component Value Date  WBC 9.5 12/15/2019   HGB 11.1 (L) 12/15/2019   HCT 35.3 (L) 12/15/2019   MCV 92.4 12/15/2019   MCH 29.1 12/15/2019   RDW 13.6 12/15/2019   PLT 193 38/46/6599   Last metabolic panel Lab Results  Component Value Date   GLUCOSE 219 (H) 12/27/2019   NA 138 12/27/2019   K 4.7 12/27/2019   CL 99 12/27/2019   CO2 23 12/27/2019   BUN 14 12/27/2019   CREATININE 1.08 12/27/2019   GFRNONAA 65 12/27/2019   GFRAA 76 12/27/2019   CALCIUM 9.7 12/27/2019   PROT 6.1 04/22/2019   ALBUMIN 4.3 04/22/2019   LABGLOB 1.8 04/22/2019   AGRATIO 2.4 (H) 04/22/2019   BILITOT 0.3 04/22/2019   ALKPHOS 60 04/22/2019   AST 16 04/22/2019   ALT 22 04/22/2019   ANIONGAP 10 12/15/2019   Last lipids Lab Results  Component Value Date   CHOL 119 04/22/2019   HDL 38 (L) 04/22/2019   LDLCALC 69 04/22/2019   TRIG 52 04/22/2019   CHOLHDL 3.1 04/22/2019      Objective    BP (!) 177/68 (BP Location: Left Arm, Patient Position: Sitting, Cuff Size: Large)   Pulse (!) 57   Temp (!) 97.3 F (36.3 C) (Temporal)   Resp 16   Wt 179 lb (81.2 kg)   BMI 25.68 kg/m  BP Readings from Last 3 Encounters:  01/26/20 (!) 177/68    12/22/19 (!) 127/57  12/15/19 (!) 153/71   Wt Readings from Last 3 Encounters:  01/26/20 179 lb (81.2 kg)  12/22/19 172 lb 6.4 oz (78.2 kg)  12/15/19 178 lb (80.7 kg)      Physical Exam Vitals reviewed.  Constitutional:      General: He is not in acute distress.    Appearance: He is well-developed. He is not diaphoretic.  HENT:     Head: Normocephalic and atraumatic.  Neck:     Thyroid: No thyromegaly.     Vascular: No JVD.     Trachea: No tracheal deviation.  Cardiovascular:     Rate and Rhythm: Normal rate and regular rhythm.     Heart sounds: Normal heart sounds. No murmur heard.  No friction rub. No gallop.   Pulmonary:     Effort: Pulmonary effort is normal. No respiratory distress.     Breath sounds: Normal breath sounds. No wheezing or rales.  Musculoskeletal:     Cervical back: Normal range of motion and neck supple.  Lymphadenopathy:     Cervical: No cervical adenopathy.       Results for orders placed or performed in visit on 01/26/20  POCT glycosylated hemoglobin (Hb A1C)  Result Value Ref Range   Hemoglobin A1C 7.8 (A) 4.0 - 5.6 %   Est. average glucose Bld gHb Est-mCnc 177     Assessment & Plan     1. Type 2 diabetes mellitus with stage 2 chronic kidney disease, without long-term current use of insulin (HCC) A1c up but patient has been drinking more gatorade because he has been outside and did have spell of dehydration. Also he has been eating more fruit and ice cream. Discussed limiting sugars and trying gatorade zero. Continue Glipizide XL 71m, metformin 10018mBID, Onglyza 39m539mF/U in 3 months for AWV.   2. Essential hypertension Elevated today but patient had not taken any medications. Continue Carvedilol 3.1239m18mD, furosemide 20mg62m, lisinopril 20mg.23[place] in about 3 months (around 04/27/2020) for AWV.      I,  Mar Daring, [name], have reviewed all documentation for this visit. The documentation on 01/26/20 for the exam,  diagnosis, procedures, and orders are all accurate and complete.   Rubye Beach  [facility] [phone] (phone) [phone] (fax)  [place]

## 2020-01-24 NOTE — Telephone Encounter (Signed)
Requested Prescriptions  Pending Prescriptions Disp Refills   allopurinol (ZYLOPRIM) 300 MG tablet [Pharmacy Med Name: ALLOPURINOL 300 MG TAB] 30 tablet 0    Sig: TAKE 1 TABLET BY MOUTH ONCE DAILY     Endocrinology:  Gout Agents Failed - 01/24/2020  3:01 PM      Failed - Uric Acid in normal range and within 360 days    No results found for: POCURA, LABURIC       Passed - Cr in normal range and within 360 days    Creat  Date Value Ref Range Status  06/12/2017 1.43 (H) 0.70 - 1.18 mg/dL Final    Comment:    For patients >64 years of age, the reference limit for Creatinine is approximately 13% higher for people identified as African-American. .    Creatinine, Ser  Date Value Ref Range Status  12/27/2019 1.08 0.76 - 1.27 mg/dL Final         Passed - Valid encounter within last 12 months    Recent Outpatient Visits          1 month ago CKD (chronic kidney disease), stage II   [facility] [place], [place], [name]   3 months ago Type 2 diabetes mellitus with stage 2 chronic kidney disease, without long-term current use of insulin (HCC)   [facility] [place], [place], [name]   5 months ago Suspected COVID-19 virus infection   [facility], [place], [name]   6 months ago Type 2 diabetes mellitus with stage 2 chronic kidney disease, without long-term current use of insulin [facility])   [facility], [place] Dike [place], [place]   9 months ago Medicare annual wellness visit, subsequent   [employer], [place], [name]      Future Appointments            In 2 days Rosezetta Schlatter, Alessandra Bevels, [name] [name]   In 1 week Gollan, Tollie Pizza, MD [facility], [facility]           Refused Prescriptions Disp Refills   ONGLYZA 5 MG TABS tablet [Pharmacy Med Name: ONGLYZA 5 MG TAB] 90 tablet 0    Sig: TAKE 1 TABLET BY MOUTH ONCE DAILY     Endocrinology:  Diabetes -  DPP-4 Inhibitors Passed - 01/24/2020  3:01 PM      Passed - HBA1C is between 0 and 7.9 and within 180 days    Hemoglobin A1C  Date Value Ref Range Status  10/24/2019 7.0 (A) 4.0 - 5.6 % Final   Hgb A1c MFr Bld  Date Value Ref Range Status  04/22/2019 7.8 (H) 4.8 - 5.6 % Final    Comment:             Prediabetes: 5.7 - 6.4          Diabetes: >6.4          Glycemic control for adults with diabetes: <7.0          Passed - Cr in normal range and within 360 days    Creat  Date Value Ref Range Status  06/12/2017 1.43 (H) 0.70 - 1.18 mg/dL Final    Comment:    For patients >66 years of age, the reference limit for Creatinine is approximately 13% higher for people identified as African-American. .    Creatinine, Ser  Date Value Ref Range Status  12/27/2019 1.08 0.76 - 1.27 mg/dL Final  Passed - Valid encounter within last 6 months    Recent Outpatient Visits          1 month ago CKD (chronic kidney disease), stage II   [facility] [place], [place], [name]   3 months ago Type 2 diabetes mellitus with stage 2 chronic kidney disease, without long-term current use of insulin [facility])   [facility] [place], [place], [place]   5 months ago Suspected COVID-19 virus infection   [facility], [place], [place]   6 months ago Type 2 diabetes mellitus with stage 2 chronic kidney disease, without long-term current use of insulin [facility])   [facility] [place], Alessandra Bevels, [place]   9 months ago Medicare annual wellness visit, subsequent   [employer], Alessandra Bevels, [name]      Future Appointments            In 2 days Rosezetta Schlatter, Alessandra Bevels, [name] [name]   In 1 week Mariah Milling, Tollie Pizza, MD [facility], [facility]

## 2020-01-26 ENCOUNTER — Ambulatory Visit: Payer: Medicare PPO | Admitting: Physician Assistant

## 2020-01-26 ENCOUNTER — Other Ambulatory Visit: Payer: Self-pay

## 2020-01-26 ENCOUNTER — Encounter: Payer: Self-pay | Admitting: Physician Assistant

## 2020-01-26 VITALS — BP 177/68 | HR 57 | Temp 97.3°F | Resp 16 | Wt 179.0 lb

## 2020-01-26 DIAGNOSIS — I10 Essential (primary) hypertension: Secondary | ICD-10-CM

## 2020-01-26 DIAGNOSIS — N182 Chronic kidney disease, stage 2 (mild): Secondary | ICD-10-CM | POA: Diagnosis not present

## 2020-01-26 DIAGNOSIS — E1122 Type 2 diabetes mellitus with diabetic chronic kidney disease: Secondary | ICD-10-CM

## 2020-01-26 DIAGNOSIS — E119 Type 2 diabetes mellitus without complications: Secondary | ICD-10-CM

## 2020-01-26 LAB — POCT GLYCOSYLATED HEMOGLOBIN (HGB A1C)
Est. average glucose Bld gHb Est-mCnc: 177
Hemoglobin A1C: 7.8 % — AB (ref 4.0–5.6)

## 2020-01-26 MED ORDER — SAXAGLIPTIN HCL 5 MG PO TABS: 5.0000 mg | ORAL_TABLET | Freq: Every day | ORAL | 3 refills | Status: DC

## 2020-02-04 NOTE — Progress Notes (Signed)
Cardiology Office Note  Date:  02/06/2020   ID:  [name] Gross Sr., DOB 07-19-1941, [name]  PCP:  Mar Daring, [name]   Chief Complaint  Patient presents with   other    6 month follow up. Meds reviewed by the pt. verbally. Pt. c/o chest pain when walking into the hospital this morning.     HPI:  [name] is a very pleasant 79 year old gentleman with  coronary artery disease,  bypass surgery in 2004,  occluded graft to the diagonal branch with 3 other patent grafts to the RCA, OM with a LIMA to the LAD by catheterization in 2007  who presents for routine followup Of his coronary artery disease .   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   Takes Lasix daily, twice daily dosing 3 days a week  EKG on today's visit shows normal sinus rhythm with rate 83 bpm, ST-T wave abnormality anterolateral and inferior leads, PVCs  Other past medical history reviewed Echocardiogram February 01, 2020 ejection fraction 50 to 01%, Grade 2 diastolic dysfunction, right heart pressures 45 mmHg, hypokinesis basal inferoseptal wall  Stress test reviewed with him in detail, date of study August 02, 2019 Ischemia in the basal and mid inferior and inferoseptal myocardium   PMH:   has a past medical history of Arthritis, Back pain, CAD (coronary artery disease), CKD (chronic kidney disease), stage II-III, Colon polyps, DM2 (diabetes mellitus, type 2) ([place]), GERD (gastroesophageal reflux disease), Glaucoma, Gout, HLD (hyperlipidemia), HTN (hypertension), and Neuropathy.  PSH:    Past Surgical History:  Procedure Laterality Date   BACK SURGERY     Cyst removed   CORONARY ARTERY BYPASS GRAFT  2004   KNEE SURGERY      Current Outpatient Medications  Medication Sig Dispense Refill   allopurinol (ZYLOPRIM) 300 MG tablet TAKE 1 TABLET BY MOUTH ONCE DAILY 90 tablet 3   aspirin 81 MG chewable tablet Chew 81 mg by mouth daily.     atorvastatin (LIPITOR) 20 MG tablet TAKE 1 TABLET BY MOUTH ONCE DAILY (Patient taking differently: Take 20 mg by mouth daily. ) 90 tablet 3   BD SHARPS CONTAINER HOME MISC Dispose of syringes after B12 injections 1 each 5  Blood Glucose Monitoring Suppl (ACCU-CHEK AVIVA PLUS) w/Device KIT To check blood sugar once daily 1 kit 0   carvedilol (COREG) 6.25 MG tablet Take 1 tablet (6.25 mg total) by mouth 2 (two) times daily. 180 tablet 3   digoxin (LANOXIN) 0.125 MG tablet TAKE 1 TABLET BY MOUTH ONCE DAILY 90 tablet 1   furosemide (LASIX) 20 MG tablet Take 1 tablet (20 mg total) by mouth daily. 90 tablet 3   gabapentin (NEURONTIN) 400 MG capsule TAKE ONE CAPSULE BY MOUTH THREE  TIMES DAILY 90 capsule 5   glipiZIDE (GLUCOTROL XL) 10 MG 24 hr tablet TAKE 1 TABLET BY MOUTH ONCE DAILY WITH BREAKFAST 90 tablet 3   glucose blood (ACCU-CHEK AVIVA PLUS) test strip USE AS DIRECTED TO CHECK FASTING BLOOD SUGAR EVERY MORNING 100 each 4   lisinopril (ZESTRIL) 20 MG tablet TAKE 1 TABLET BY MOUTH ONCE DAILY 90 tablet 3   meloxicam (MOBIC) 7.5 MG tablet TAKE 1 TABLET BY MOUTH TWICE (2) DAILY 180 tablet 1   metFORMIN (GLUCOPHAGE) 1000 MG tablet TAKE 1 TABLET BY MOUTH TWICE (2) DAILY WITH A MEAL 180 tablet 1   nitroGLYCERIN (NITROSTAT) 0.4 MG SL tablet Place 1 tablet (0.4 mg total) under the tongue every 5 (five) minutes as needed for chest pain. 25 tablet 6   potassium chloride (KLOR-CON) 10 MEQ tablet TAKE 1 TABLET BY MOUTH ONCE DAILY 90 tablet 1   saxagliptin HCl (ONGLYZA) 5 MG TABS tablet Take 1 tablet (5 mg total) by mouth daily. 90 tablet 3   SYRINGE-NEEDLE, DISP, 3 ML (B-D SYRINGE/NEEDLE 3CC/25GX5/8) 25G X 5/8" 3 ML MISC For use with cyanocobalamin injections 50 each 0   tamsulosin (FLOMAX) 0.4 MG CAPS capsule TAKE 1 CAPSULE BY MOUTH ONCE DAILY 90 capsule 0   isosorbide mononitrate (IMDUR) 30 MG 24 hr tablet Take 1 tablet (30 mg total) by mouth in the morning and at bedtime. 180 tablet 3   No current facility-administered medications for this visit.     Allergies:   Ramipril   Social History:  The patient  reports that he has never smoked. His smokeless tobacco use includes chew. He reports that he does not drink alcohol and does not use drugs.   Family History:   Family history is unknown by patient.    Review of Systems: Review of Systems  Constitutional: Negative.   HENT: Negative.   Respiratory: Negative.   Cardiovascular: Positive for chest pain.  Gastrointestinal: Negative.   Musculoskeletal: Negative.   Neurological: Negative.   Psychiatric/Behavioral: Negative.   All other systems reviewed and are negative.   PHYSICAL EXAM: VS:  BP (!) 160/70  (BP Location: Left Arm, Patient Position: Sitting, Cuff Size: Normal)    Pulse 83    Ht '5\' 8"'$  (1.727 m)    Wt 178 lb 8 oz (81 kg)    SpO2 98%    BMI 27.14 kg/m  , BMI Body mass index is 27.14 kg/m.  Constitutional:  oriented to person, place, and time. No distress.  HENT:  Head: Normocephalic and atraumatic.  Eyes:  no discharge. No scleral icterus.  Neck: Normal range of motion. Neck supple. No JVD present.  Cardiovascular: Normal rate, regular rhythm, normal heart sounds and intact distal pulses. Exam reveals no gallop and no friction rub. No edema No murmur heard. Pulmonary/Chest: Effort normal and breath sounds normal. No stridor. No respiratory distress.  no wheezes.  no rales.  no tenderness.  Abdominal: Soft.  no distension.  no tenderness.  Musculoskeletal: Normal range of motion.  no  tenderness or deformity.  Neurological:  normal muscle tone. Coordination normal. No atrophy Skin: Skin is warm and dry. No rash noted. not diaphoretic.  Psychiatric:  normal mood and affect. behavior is normal. Thought content normal.    Recent Labs: 04/22/2019: ALT 22 12/15/2019: Hemoglobin 11.1; Platelets 193 12/27/2019: BUN 14; Creatinine, Ser 1.08; Potassium 4.7; Sodium 138    Lipid Panel Lab Results  Component Value Date   CHOL 119 04/22/2019   HDL 38 (L) 04/22/2019   LDLCALC 69 04/22/2019   TRIG 52 04/22/2019      Wt Readings from Last 3 Encounters:  02/06/20 178 lb 8 oz (81 kg)  01/26/20 179 lb (81.2 kg)  12/22/19 172 lb 6.4 oz (78.2 kg)      ASSESSMENT AND PLAN:  CAD with stable angina Echocardiogram with region of hypokinesis basal inferior wall  stress test showing ischemia basal inferior to mid inferior wall On prior office visit with asymptomatic, no catheterization was scheduled Previously discussed with patient's daughter on prior office visit -Non-smoker, cholesterol at goal On today's visit a mixed picture unable to exclude angina versus atypical chest discomfort  from using the hoe in the garden -Often able to work without any chest pain, he thinks that this is medications in the morning -For now we will treat his blood pressure, Imdur up to 30 twice daily given high pressure, Coreg up to 6.25 twice daily Lasix down to daily not twice daily 3 days a week/with daily dosing  essential hypertension - Plan: EKG 12-Lead Changes as above, recommend he monitor blood pressure and call our office Certainly possible of high blood pressure contributing to chest tightness  Type 2 diabetes mellitus with complication, without long-term current use of insulin (HCC) Eating more citrus at home A1c 7.8   Total encounter time more than 25 minutes  Greater than 50% was spent in counseling and coordination of care with the patient     Orders Placed This Encounter  Procedures   EKG 12-Lead     Signed, Esmond Plants, M.D., Ph.D. 02/06/2020  [place], [place]

## 2020-02-06 ENCOUNTER — Encounter: Payer: Self-pay | Admitting: Cardiovascular Disease

## 2020-02-06 ENCOUNTER — Other Ambulatory Visit: Payer: Self-pay

## 2020-02-06 ENCOUNTER — Ambulatory Visit: Payer: Medicare PPO | Admitting: Cardiovascular Disease

## 2020-02-06 VITALS — BP 160/70 | HR 83 | Ht 68.0 in | Wt 178.5 lb

## 2020-02-06 DIAGNOSIS — E785 Hyperlipidemia, unspecified: Secondary | ICD-10-CM | POA: Diagnosis not present

## 2020-02-06 DIAGNOSIS — I25118 Atherosclerotic heart disease of native coronary artery with other forms of angina pectoris: Secondary | ICD-10-CM | POA: Diagnosis not present

## 2020-02-06 DIAGNOSIS — I10 Essential (primary) hypertension: Secondary | ICD-10-CM | POA: Diagnosis not present

## 2020-02-06 DIAGNOSIS — E118 Type 2 diabetes mellitus with unspecified complications: Secondary | ICD-10-CM

## 2020-02-06 MED ORDER — ISOSORBIDE MONONITRATE ER 30 MG PO TB24: 30.0000 mg | ORAL_TABLET | Freq: Two times a day (BID) | ORAL | 3 refills | Status: DC

## 2020-02-06 MED ORDER — CARVEDILOL 6.25 MG PO TABS: 6.2500 mg | ORAL_TABLET | Freq: Two times a day (BID) | ORAL | 3 refills | Status: DC

## 2020-02-06 MED ORDER — FUROSEMIDE 20 MG PO TABS: 20.0000 mg | ORAL_TABLET | Freq: Every day | ORAL | 3 refills | Status: DC

## 2020-02-06 MED ORDER — ISOSORBIDE MONONITRATE ER 30 MG PO TB24: 30.0000 mg | ORAL_TABLET | Freq: Every day | ORAL | 3 refills | Status: DC

## 2020-02-06 NOTE — Patient Instructions (Addendum)
Monitor blood pressure at home  Make sure to check Blood Pressures 2 hours after your medications.  Please keep a log of your readings so we can see how they trend.  Avoid these things for 30 minutes before checking your blood pressure:  Drinking caffeine.  Drinking alcohol.  Eating.  Smoking.  Exercising.  Five minutes before checking your blood pressure:  Pee.  Sit in a dining chair. Avoid sitting in a soft couch or armchair.  Be quiet. Do not talk.   Medication Instructions:  Your physician has recommended you make the following change in your medication:  1. INCREASE Isosorbide mononitrate (Imdur) 30 mg twice a day 2. INCREASE Carvedilol 6.25 mg twice a day 3. STAY on Furosemide 20 mg once daily    If you need a refill on your cardiac medications before your next appointment, please call your pharmacy.    Lab work: No new labs needed   If you have labs (blood work) drawn today and your tests are completely normal, you will receive your results only by: Marland Kitchen MyChart Message (if you have MyChart) OR . A paper copy in the mail If you have any lab test that is abnormal or we need to change your treatment, we will call you to review the results.   Testing/Procedures: No new testing needed   Follow-Up: At [facility], you and your health needs are our priority.  As part of our continuing mission to provide you with exceptional heart care, we have created designated Provider Care Teams.  These Care Teams include your primary Cardiologist (physician) and Advanced Practice Providers (APPs -  Physician Assistants and Nurse Practitioners) who all work together to provide you with the care you need, when you need it.  . You will need a follow up appointment in 3 months   . Providers on your designated Care Team:   . Nicolasa Ducking, NP . Eula Listen, [name] . Marisue Ivan, [name]  Any Other Special Instructions Will Be Listed Below (If Applicable).  COVID-19  Vaccine Information can be found at: [email or web address] For questions related to vaccine distribution or appointments, please email [email or web address] or call [phone].

## 2020-03-02 ENCOUNTER — Other Ambulatory Visit: Payer: Self-pay | Admitting: Physician Assistant

## 2020-03-02 MED ORDER — SITAGLIPTIN PHOSPHATE 50 MG PO TABS: 50.0000 mg | ORAL_TABLET | Freq: Every day | ORAL | 3 refills | Status: DC

## 2020-03-02 NOTE — Progress Notes (Signed)
Patient advised.

## 2020-03-02 NOTE — Progress Notes (Signed)
Onglyza no longer covered by patient's insurance. Insurance requesting change to Januvia. I have sent Januvia to [facility].

## 2020-04-11 ENCOUNTER — Other Ambulatory Visit: Payer: Self-pay | Admitting: Physician Assistant

## 2020-04-11 DIAGNOSIS — I10 Essential (primary) hypertension: Secondary | ICD-10-CM

## 2020-04-11 NOTE — Telephone Encounter (Signed)
Requested medication (s) are due for refill today: yes  Requested medication (s) are on the active medication list: yes  Last refill:  03/31/19  #90 3 refills  Future visit scheduled: yes  Notes to clinic:  expired RX    Requested Prescriptions  Pending Prescriptions Disp Refills   lisinopril (ZESTRIL) 20 MG tablet [Pharmacy Med Name: LISINOPRIL 20 MG TAB] 90 tablet 3    Sig: TAKE 1 TABLET BY MOUTH ONCE DAILY      Cardiovascular:  ACE Inhibitors Failed - 04/11/2020  4:24 PM      Failed - Last BP in normal range    BP Readings from Last 1 Encounters:  02/06/20 (!) 160/70          Passed - Cr in normal range and within 180 days    Creat  Date Value Ref Range Status  06/12/2017 1.43 (H) 0.70 - 1.18 mg/dL Final    Comment:    For patients >35 years of age, the reference limit for Creatinine is approximately 13% higher for people identified as African-American. .    Creatinine, Ser  Date Value Ref Range Status  12/27/2019 1.08 0.76 - 1.27 mg/dL Final          Passed - K in normal range and within 180 days    Potassium  Date Value Ref Range Status  12/27/2019 4.7 3.5 - 5.2 mmol/L Final          Passed - Patient is not pregnant      Passed - Valid encounter within last 6 months    Recent Outpatient Visits           2 months ago Type 2 diabetes mellitus with stage 2 chronic kidney disease, without long-term current use of insulin [facility])   [facility] [place], [place], [name]   3 months ago CKD (chronic kidney disease), stage II   [facility] [place], [place], [name]   5 months ago Type 2 diabetes mellitus with stage 2 chronic kidney disease, without long-term current use of insulin [facility])   [facility] [place], [place], [place]   7 months ago Suspected COVID-19 virus infection   [facility], [place], [place]   8 months ago Type 2 diabetes mellitus with stage 2 chronic kidney disease, without  long-term current use of insulin [facility])   [facility] [place], Alessandra Bevels, [name]       Future Appointments             In 1 month Gollan, Tollie Pizza, MD [facility]

## 2020-04-20 NOTE — Progress Notes (Signed)
Annual Wellness Visit     Patient: [name] Rogus., Male    DOB: 1941-05-30, 79 y.o.   MRN: [id number] Visit Date: 04/23/2020  Today's Provider: Mar Daring, [name]   Chief Complaint  Patient presents with  . Medicare [place]. is a 79 y.o. male who presents today for his Annual Wellness Visit. He reports consuming a general diet. The patient has a physically strenuous job, but has no regular exercise apart from work.  He generally feels well. He reports sleeping fairly well. He does have additional problems to discuss today.   HPI He reports that in the last few days he has been having dizzy spells. Reports that if he stands for a while he starts to get dizzy. If he sits down and gets up fast he has also noticed.Reports that he has been drinking Gatorade and Water a lot. He has also noticed some weight loss. He feels that ever since he had the Syncope that he ended up at the ED he has not been the same.  Wt Readings from Last 3 Encounters:  04/23/20 168 lb 12.8 oz (76.6 kg)  02/06/20 178 lb 8 oz (81 kg)  01/26/20 179 lb (81.2 kg)    Patient Active Problem List   Diagnosis Date Noted  . Orthostatic hypotension 04/23/2020  . RBBB 12/27/2019  . CKD (chronic kidney disease), stage II   . 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 Primary osteoarthritis of both knees 01/20/2018  . CAD (coronary artery disease), native coronary artery 08/04/2017  . Acute pain of left knee 06/12/2017  . Sebaceous cyst 09/10/2016  . Neuropathy 05/22/2016  . Type 2 diabetes mellitus with complication, without long-term current use of insulin ([place]) 11/07/2009  . Mixed hyperlipidemia 11/07/2009  . GOUT, UNSPECIFIED 11/07/2009  . Essential hypertension 11/07/2009  . UNSTABLE ANGINA 11/07/2009  . 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 GERD 11/07/2009  . Backache 11/07/2009   Past Medical History:  Diagnosis Date  .  Arthritis   . Back pain    hx  . CAD (coronary artery disease)    a. 2004 s/p CABG x 4 (LIMA->LAD, VG->Diag, VG->OM, VG->RCA); b. 2007 Cath: VG->Diag occluded, otw 3/4 patent grafts.  . CKD (chronic kidney disease), stage II-III   . Colon polyps   . DM2 (diabetes mellitus, type 2) (HCC)    stable  . GERD (gastroesophageal reflux disease)   . Glaucoma   . Gout   . HLD (hyperlipidemia)   . HTN (hypertension)   . Neuropathy        Medications: Outpatient Medications Prior to Visit  Medication Sig  . allopurinol (ZYLOPRIM) 300 MG tablet TAKE 1 TABLET BY MOUTH ONCE DAILY  . aspirin 81 MG chewable tablet Chew 81 mg by mouth daily.  Marland Kitchen atorvastatin (LIPITOR) 20 MG tablet TAKE 1 TABLET BY MOUTH ONCE DAILY (Patient taking differently: Take 20 mg by mouth daily. )  . BD SHARPS CONTAINER HOME MISC Dispose of syringes after B12 injections  . Blood Glucose Monitoring Suppl (ACCU-CHEK AVIVA PLUS) w/Device KIT To check blood sugar once daily  . carvedilol (COREG) 6.25 MG tablet Take 1 tablet (6.25 mg total) by mouth 2 (two) times daily.  . digoxin (LANOXIN) 0.125 MG tablet TAKE 1 TABLET BY MOUTH ONCE DAILY  . furosemide (LASIX) 20 MG tablet Take 1 tablet (20 mg total) by mouth daily.  Marland Kitchen gabapentin (NEURONTIN) 400 MG capsule  TAKE ONE CAPSULE BY MOUTH THREE TIMES DAILY  . glipiZIDE (GLUCOTROL XL) 10 MG 24 hr tablet TAKE 1 TABLET BY MOUTH ONCE DAILY WITH BREAKFAST  . glucose blood (ACCU-CHEK AVIVA PLUS) test strip USE AS DIRECTED TO CHECK FASTING BLOOD SUGAR EVERY MORNING  . isosorbide mononitrate (IMDUR) 30 MG 24 hr tablet Take 1 tablet (30 mg total) by mouth in the morning and at bedtime.  Marland Kitchen lisinopril (ZESTRIL) 20 MG tablet TAKE 1 TABLET BY MOUTH ONCE DAILY  . meloxicam (MOBIC) 7.5 MG tablet TAKE 1 TABLET BY MOUTH TWICE (2) DAILY  . metFORMIN (GLUCOPHAGE) 1000 MG tablet TAKE 1 TABLET BY MOUTH TWICE (2) DAILY WITH A MEAL  . nitroGLYCERIN (NITROSTAT) 0.4 MG SL tablet Place 1 tablet (0.4 mg total)  under the tongue every 5 (five) minutes as needed for chest pain.  . potassium chloride (KLOR-CON) 10 MEQ tablet TAKE 1 TABLET BY MOUTH ONCE DAILY  . sitaGLIPtin (JANUVIA) 50 MG tablet Take 1 tablet (50 mg total) by mouth daily.  . SYRINGE-NEEDLE, DISP, 3 ML (B-D SYRINGE/NEEDLE 3CC/25GX5/8) 25G X 5/8" 3 ML MISC For use with cyanocobalamin injections  . tamsulosin (FLOMAX) 0.4 MG CAPS capsule TAKE 1 CAPSULE BY MOUTH ONCE DAILY   No facility-administered medications prior to visit.    Allergies  Allergen Reactions  . Ramipril     Patient Care Team: Mar Daring, [name] as PCP - General (Family Medicine) Minna Merritts, MD as PCP - Cardiology (Cardiology) Minna Merritts, MD as Consulting Physician (Cardiology) Bary Castilla Forest Gleason, MD (General Surgery)  Review of Systems  Constitutional: Positive for unexpected weight change. Negative for activity change, appetite change, chills, diaphoresis, fatigue and fever.  HENT: Negative.   Eyes: Negative.   Respiratory: Negative.   Cardiovascular: Negative.  Negative for chest pain and palpitations.  Gastrointestinal: Negative.   Endocrine: Negative.   Genitourinary: Negative.   Musculoskeletal: Negative.   Skin: Negative.   Allergic/Immunologic: Negative.   Neurological: Positive for dizziness and light-headedness.  Hematological: Negative.   Psychiatric/Behavioral: Negative.     Last CBC Lab Results  Component Value Date   WBC 9.5 12/15/2019   HGB 11.1 (L) 12/15/2019   HCT 35.3 (L) 12/15/2019   MCV 92.4 12/15/2019   MCH 29.1 12/15/2019   RDW 13.6 12/15/2019   PLT 193 81/07/7508   Last metabolic panel Lab Results  Component Value Date   GLUCOSE 219 (H) 12/27/2019   NA 138 12/27/2019   K 4.7 12/27/2019   CL 99 12/27/2019   CO2 23 12/27/2019   BUN 14 12/27/2019   CREATININE 1.08 12/27/2019   GFRNONAA 65 12/27/2019   GFRAA 76 12/27/2019   CALCIUM 9.7 12/27/2019   PROT 6.1 04/22/2019   ALBUMIN 4.3 04/22/2019    LABGLOB 1.8 04/22/2019   AGRATIO 2.4 (H) 04/22/2019   BILITOT 0.3 04/22/2019   ALKPHOS 60 04/22/2019   AST 16 04/22/2019   ALT 22 04/22/2019   ANIONGAP 10 12/15/2019      Objective    Vitals: BP (!) 103/49 (BP Location: Left Arm, Patient Position: Sitting, Cuff Size: Large)   Pulse 60   Temp 98.8 F (37.1 C) (Oral)   Resp 16   Ht _0  (1.727 m)   Wt 168 lb 12.8 oz (76.6 kg)   SpO2 100%   BMI 25.67 kg/m    Orthostatic VS for the past 24 hrs (Last 3 readings):  BP- Lying Pulse- Lying BP- Sitting Pulse- Sitting BP- Standing at 0 minutes Pulse-  Standing at 0 minutes  04/23/20 0729 (!) 131/97 57 98/51 60 105/51 67    BP Readings from Last 3 Encounters:  04/23/20 (!) 103/49  02/06/20 (!) 160/70  01/26/20 (!) 177/68   Wt Readings from Last 3 Encounters:  04/23/20 168 lb 12.8 oz (76.6 kg)  02/06/20 178 lb 8 oz (81 kg)  01/26/20 179 lb (81.2 kg)       Physical Exam Vitals reviewed.  Constitutional:      General: He is not in acute distress.    Appearance: Normal appearance. He is well-developed. He is not ill-appearing.  HENT:     Head: Normocephalic and atraumatic.     Right Ear: Tympanic membrane, ear canal and external ear normal.     Left Ear: Tympanic membrane, ear canal and external ear normal.  Eyes:     General: No scleral icterus.       Right eye: No discharge.        Left eye: No discharge.     Extraocular Movements: Extraocular movements intact.     Conjunctiva/sclera: Conjunctivae normal.     Pupils: Pupils are equal, round, and reactive to light.  Neck:     Thyroid: No thyromegaly.     Vascular: No carotid bruit.     Trachea: No tracheal deviation.  Cardiovascular:     Rate and Rhythm: Normal rate and regular rhythm.     Pulses: Normal pulses.     Heart sounds: Normal heart sounds. No murmur heard.   Pulmonary:     Effort: Pulmonary effort is normal. No respiratory distress.     Breath sounds: Normal breath sounds. No wheezing or rales.  Chest:       Chest wall: No tenderness.  Abdominal:     General: Abdomen is flat. Bowel sounds are normal. There is no distension.     Palpations: Abdomen is soft. There is no mass.     Tenderness: There is no abdominal tenderness. There is no guarding or rebound.  Musculoskeletal:        General: No tenderness. Normal range of motion.     Cervical back: Normal range of motion and neck supple.     Right lower leg: No edema.     Left lower leg: No edema.  Lymphadenopathy:     Cervical: No cervical adenopathy.  Skin:    General: Skin is warm and dry.     Capillary Refill: Capillary refill takes less than 2 seconds.     Findings: No erythema or rash.  Neurological:     General: No focal deficit present.     Mental Status: He is alert and oriented to person, place, and time. Mental status is at baseline.     Cranial Nerves: No cranial nerve deficit.     Motor: No abnormal muscle tone.     Coordination: Coordination normal.     Deep Tendon Reflexes: Reflexes are normal and symmetric. Reflexes normal.  Psychiatric:        Mood and Affect: Mood normal.        Behavior: Behavior normal.        Thought Content: Thought content normal.        Judgment: Judgment normal.      Most recent functional status assessment: No flowsheet data found. Most recent fall risk assessment: Fall Risk  04/22/2019  Falls in the past year? 1  Number falls in past yr: 0  Injury with Fall? 0  Follow up Falls evaluation completed  Most recent depression screenings: PHQ 2/9 Scores 04/22/2019 03/18/2018  PHQ - 2 Score 0 0  PHQ- 9 Score 1 -   Most recent cognitive screening: 6CIT Screen 04/22/2019  What Year? 0 points  What month? 0 points  What time? 0 points  Count back from 20 0 points  Months in reverse 4 points  Repeat phrase 2 points  Total Score 6   Most recent Audit-C alcohol use screening Alcohol Use Disorder Test (AUDIT) 04/22/2019  1. How often do you have a drink containing alcohol? 1  2. How  many drinks containing alcohol do you have on a typical day when you are drinking? 0  3. How often do you have six or more drinks on one occasion? 0  AUDIT-C Score 1  Alcohol Brief Interventions/Follow-up AUDIT Score <7 follow-up not indicated   A score of 3 or more in women, and 4 or more in men indicates increased risk for alcohol abuse, EXCEPT if all of the points are from question 1   No results found for any visits on 04/23/20.  Assessment & Plan     Annual wellness visit done today including the all of the following: Reviewed patient's Family Medical History Reviewed and updated list of patient's medical providers Assessment of cognitive impairment was done Assessed patient's functional ability Established a written schedule for health screening services Health Risk Assessment Completed and Reviewed  Exercise Activities and Dietary recommendations Goals    . Increase water intake     Recommend increasing water intake to 6-8 glasses a day.     . Quit Chewing Tobacco     Recommend to continue decreasing amount of tobacco chewed then completely quitting.        Immunization History  Administered Date(s) Administered  . Fluad Quad(high Dose 65+) 04/22/2019  . Influenza, High Dose Seasonal PF 03/17/2017, 04/21/2018  . Influenza,inj,Quad PF,6+ Mos 05/05/2016  . Pneumococcal Conjugate-13 03/13/2016  . Pneumococcal Polysaccharide-23 03/17/2017    Health Maintenance  Topic Date Due  . Hepatitis C Screening  Never done  . OPHTHALMOLOGY EXAM  Never done  . COVID-19 Vaccine (1) Never done  . FOOT EXAM  04/22/2019  . INFLUENZA VACCINE  02/26/2020  . TETANUS/TDAP  04/23/2021 (Originally 11/19/1959)  . HEMOGLOBIN A1C  07/28/2020  . PNA vac Low Risk Adult  Completed     Discussed health benefits of physical activity, and encouraged him to engage in regular exercise appropriate for his age and condition.    1. Medicare annual wellness visit, subsequent Overall normal exam  with exception of the decrease in BP with changes of position consistent with orthostatic hypotension. Screenings and vaccinations updated. Aware to schedule eye exam.   2. Essential hypertension Low today. Recently had Carvedilol 6.73m increased to BID dosing, as well as Imdur 370mincreased to BID dosing. Will decrease Imdur back to once daily dosing and leave Carvedilol at BID dosing. Push fluids. He is to call if he continues to have the lightheadedness with changes of position. Will check labs as below and f/u pending results. - CBC with Differential/Platelet - Comprehensive metabolic panel - Lipid panel - isosorbide mononitrate (IMDUR) 30 MG 24 hr tablet; Take 1 tablet (30 mg total) by mouth daily.  Dispense: 90 tablet; Refill: 3  3. Type 2 diabetes mellitus with complication, without long-term current use of insulin (HCC) Has been stable. Continue Glipizide XR 1053maily, Januvia 51m45mily, metformin 1000mg23m. Will check labs as below and f/u pending results. -  CBC with Differential/Platelet - Comprehensive metabolic panel - Hemoglobin A1c  4. CKD (chronic kidney disease), stage II Push fluids. Avoid NSAIDs. Will check labs as below and f/u pending results. - CBC with Differential/Platelet - Comprehensive metabolic panel  5. Idiopathic chronic gout of foot without tophus, unspecified laterality On Allopurinol 328m. No recent flares. Will check labs as below and f/u pending results. - CBC with Differential/Platelet - Comprehensive metabolic panel - Uric acid  6. Mixed hyperlipidemia On Atorvastatin 214m Will check labs as below and f/u pending results. - CBC with Differential/Platelet - Comprehensive metabolic panel - Lipid panel  7. Orthostatic hypotension Noted today. See plan for #2.  - CBC with Differential/Platelet - Comprehensive metabolic panel  8. Need for influenza vaccination Flu vaccine given today without complication. Patient sat upright for 15 minutes  to check for adverse reaction before being released. - Flu Vaccine QUAD High Dose(Fluad)  9. Encounter for hepatitis C screening test for low risk patient Will check labs as below and f/u pending results. - Hepatitis C Antibody  10. Weight loss Has lost 11 pounds since last visit. Reports not eating normally like he once did. Going out to eat more but eating only once or twice daily compared to 3 meals like he used to do. Going to try to start eating at home again and eating more regularly or taking a snack with him into the field. Will monitor at next visit.    No follow-ups on file.     I,Reynolds BowlPA-C, have reviewed all documentation for this visit. The documentation on 04/23/20 for the exam, diagnosis, procedures, and orders are all accurate and complete.   JeRubye BeachBuThe Urology Center [phone]) [phone])  [place]

## 2020-04-23 ENCOUNTER — Ambulatory Visit (INDEPENDENT_AMBULATORY_CARE_PROVIDER_SITE_OTHER): Payer: Medicare Other | Admitting: Physician Assistant

## 2020-04-23 ENCOUNTER — Encounter: Payer: Self-pay | Admitting: Physician Assistant

## 2020-04-23 ENCOUNTER — Other Ambulatory Visit: Payer: Self-pay

## 2020-04-23 VITALS — BP 103/49 | HR 60 | Temp 98.8°F | Resp 16 | Ht 68.0 in | Wt 168.8 lb

## 2020-04-23 DIAGNOSIS — Z23 Encounter for immunization: Secondary | ICD-10-CM

## 2020-04-23 DIAGNOSIS — I10 Essential (primary) hypertension: Secondary | ICD-10-CM

## 2020-04-23 DIAGNOSIS — I951 Orthostatic hypotension: Secondary | ICD-10-CM

## 2020-04-23 DIAGNOSIS — E782 Mixed hyperlipidemia: Secondary | ICD-10-CM

## 2020-04-23 DIAGNOSIS — R634 Abnormal weight loss: Secondary | ICD-10-CM | POA: Diagnosis not present

## 2020-04-23 DIAGNOSIS — N182 Chronic kidney disease, stage 2 (mild): Secondary | ICD-10-CM

## 2020-04-23 DIAGNOSIS — Z0000 Encounter for general adult medical examination without abnormal findings: Secondary | ICD-10-CM | POA: Diagnosis not present

## 2020-04-23 DIAGNOSIS — M1A0790 Idiopathic chronic gout, unspecified ankle and foot, without tophus (tophi): Secondary | ICD-10-CM

## 2020-04-23 DIAGNOSIS — E118 Type 2 diabetes mellitus with unspecified complications: Secondary | ICD-10-CM

## 2020-04-23 DIAGNOSIS — Z1159 Encounter for screening for other viral diseases: Secondary | ICD-10-CM

## 2020-04-23 MED ORDER — ISOSORBIDE MONONITRATE ER 30 MG PO TB24: 30.0000 mg | ORAL_TABLET | Freq: Every day | ORAL | 3 refills | Status: DC

## 2020-04-23 NOTE — Patient Instructions (Addendum)
Decrease Imdur to 30mg  daily from twice daily. Continue all other medications the same.   Orthostatic Hypotension Blood pressure is a measurement of how strongly, or weakly, your blood is pressing against the walls of your arteries. Orthostatic hypotension is a sudden drop in blood pressure that happens when you quickly change positions, such as when you get up from sitting or lying down. Arteries are blood vessels that carry blood from your heart throughout your body. When blood pressure is too low, you may not get enough blood to your brain or to the rest of your organs. This can cause weakness, light-headedness, rapid heartbeat, and fainting. This can last for just a few seconds or for up to a few minutes. Orthostatic hypotension is usually not a serious problem. However, if it happens frequently or gets worse, it may be a sign of something more serious. What are the causes? This condition may be caused by:  Sudden changes in posture, such as standing up quickly after you have been sitting or lying down.  Blood loss.  Loss of body fluids (dehydration).  Heart problems.  Hormone (endocrine) problems.  Pregnancy.  Severe infection.  Lack of certain nutrients.  Severe allergic reactions (anaphylaxis).  Certain medicines, such as blood pressure medicine or medicines that make the body lose excess fluids (diuretics). Sometimes, this condition can be caused by not taking medicine as directed, such as taking too much of a certain medicine. What increases the risk? The following factors may make you more likely to develop this condition:  Age. Risk increases as you get older.  Conditions that affect the heart or the central nervous system.  Taking certain medicines, such as blood pressure medicine or diuretics.  Being pregnant. What are the signs or symptoms? Symptoms of this condition may include:  Weakness.  Light-headedness.  Dizziness.  Blurred  vision.  Fatigue.  Rapid heartbeat.  Fainting, in severe cases. How is this diagnosed? This condition is diagnosed based on:  Your medical history.  Your symptoms.  Your blood pressure measurement. Your health care provider will check your blood pressure when you are: ? Lying down. ? Sitting. ? Standing. A blood pressure reading is recorded as two numbers, such as "120 over 80" (or 120/80). The first ("top") number is called the systolic pressure. It is a measure of the pressure in your arteries as your heart beats. The second ("bottom") number is called the diastolic pressure. It is a measure of the pressure in your arteries when your heart relaxes between beats. Blood pressure is measured in a unit called mm Hg. Healthy blood pressure for most adults is 120/80. If your blood pressure is below 90/60, you may be diagnosed with hypotension. Other information or tests that may be used to diagnose orthostatic hypotension include:  Your other vital signs, such as your heart rate and temperature.  Blood tests.  Tilt table test. For this test, you will be safely secured to a table that moves you from a lying position to an upright position. Your heart rhythm and blood pressure will be monitored during the test. How is this treated? This condition may be treated by:  Changing your diet. This may involve eating more salt (sodium) or drinking more water.  Taking medicines to raise your blood pressure.  Changing the dosage of certain medicines you are taking that might be lowering your blood pressure.  Wearing compression stockings. These stockings help to prevent blood clots and reduce swelling in your legs. In some cases,  you may need to go to the hospital for:  Fluid replacement. This means you will receive fluids through an IV.  Blood replacement. This means you will receive donated blood through an IV (transfusion).  Treating an infection or heart problems, if this  applies.  Monitoring. You may need to be monitored while medicines that you are taking wear off. Follow these instructions at home: Eating and drinking   Drink enough fluid to keep your urine pale yellow.  Eat a healthy diet, and follow instructions from your health care provider about eating or drinking restrictions. A healthy diet includes: ? Fresh fruits and vegetables. ? Whole grains. ? Lean meats. ? Low-fat dairy products.  Eat extra salt only as directed. Do not add extra salt to your diet unless your health care provider told you to do that.  Eat frequent, small meals.  Avoid standing up suddenly after eating. Medicines  Take over-the-counter and prescription medicines only as told by your health care provider. ? Follow instructions from your health care provider about changing the dosage of your current medicines, if this applies. ? Do not stop or adjust any of your medicines on your own. General instructions   Wear compression stockings as told by your health care provider.  Get up slowly from lying down or sitting positions. This gives your blood pressure a chance to adjust.  Avoid hot showers and excessive heat as directed by your health care provider.  Return to your normal activities as told by your health care provider. Ask your health care provider what activities are safe for you.  Do not use any products that contain nicotine or tobacco, such as cigarettes, e-cigarettes, and chewing tobacco. If you need help quitting, ask your health care provider.  Keep all follow-up visits as told by your health care provider. This is important. Contact a health care provider if you:  Vomit.  Have diarrhea.  Have a fever for more than 2-3 days.  Feel more thirsty than usual.  Feel weak and tired. Get help right away if you:  Have chest pain.  Have a fast or irregular heartbeat.  Develop numbness in any part of your body.  Cannot move your arms or your  legs.  Have trouble speaking.  Become sweaty or feel light-headed.  Faint.  Feel short of breath.  Have trouble staying awake.  Feel confused. Summary  Orthostatic hypotension is a sudden drop in blood pressure that happens when you quickly change positions.  Orthostatic hypotension is usually not a serious problem.  It is diagnosed by having your blood pressure taken lying down, sitting, and then standing.  It may be treated by changing your diet or adjusting your medicines. This information is not intended to replace advice given to you by your health care provider. Make sure you discuss any questions you have with your health care provider. Document Revised: 01/07/2018 Document Reviewed: 01/07/2018 Elsevier Patient Education  2020 [employer].

## 2020-04-24 LAB — CBC WITH DIFFERENTIAL/PLATELET
Basophils Absolute: 0.1 10*3/uL (ref 0.0–0.2)
Basos: 1 %
EOS (ABSOLUTE): 0.2 10*3/uL (ref 0.0–0.4)
Eos: 3 %
Hematocrit: 35 % — ABNORMAL LOW (ref 37.5–51.0)
Hemoglobin: 11.5 g/dL — ABNORMAL LOW (ref 13.0–17.7)
Immature Grans (Abs): 0 10*3/uL (ref 0.0–0.1)
Immature Granulocytes: 0 %
Lymphocytes Absolute: 2.3 10*3/uL (ref 0.7–3.1)
Lymphs: 31 %
MCH: 29.6 pg (ref 26.6–33.0)
MCHC: 32.9 g/dL (ref 31.5–35.7)
MCV: 90 fL (ref 79–97)
Monocytes Absolute: 0.6 10*3/uL (ref 0.1–0.9)
Monocytes: 8 %
Neutrophils Absolute: 4.3 10*3/uL (ref 1.4–7.0)
Neutrophils: 57 %
Platelets: 224 10*3/uL (ref 150–450)
RBC: 3.88 x10E6/uL — ABNORMAL LOW (ref 4.14–5.80)
RDW: 12.8 % (ref 11.6–15.4)
WBC: 7.4 10*3/uL (ref 3.4–10.8)

## 2020-04-24 LAB — COMPREHENSIVE METABOLIC PANEL
ALT: 10 IU/L (ref 0–44)
AST: 9 IU/L (ref 0–40)
Albumin/Globulin Ratio: 2.2 (ref 1.2–2.2)
Albumin: 4.1 g/dL (ref 3.7–4.7)
Alkaline Phosphatase: 58 IU/L (ref 44–121)
BUN/Creatinine Ratio: 14 (ref 10–24)
BUN: 17 mg/dL (ref 8–27)
Bilirubin Total: 0.4 mg/dL (ref 0.0–1.2)
CO2: 22 mmol/L (ref 20–29)
Calcium: 9.5 mg/dL (ref 8.6–10.2)
Chloride: 98 mmol/L (ref 96–106)
Creatinine, Ser: 1.21 mg/dL (ref 0.76–1.27)
GFR calc Af Amer: 66 mL/min/{1.73_m2} (ref >59)
GFR calc non Af Amer: 57 mL/min/{1.73_m2} — ABNORMAL LOW (ref >59)
Globulin, Total: 1.9 g/dL (ref 1.5–4.5)
Glucose: 281 mg/dL — ABNORMAL HIGH (ref 65–99)
Potassium: 4.6 mmol/L (ref 3.5–5.2)
Sodium: 135 mmol/L (ref 134–144)
Total Protein: 6 g/dL (ref 6.0–8.5)

## 2020-04-24 LAB — LIPID PANEL
Chol/HDL Ratio: 3.6 ratio (ref 0.0–5.0)
Cholesterol, Total: 115 mg/dL (ref 100–199)
HDL: 32 mg/dL — ABNORMAL LOW (ref >39)
LDL Chol Calc (NIH): 57 mg/dL (ref 0–99)
Triglycerides: 148 mg/dL (ref 0–149)
VLDL Cholesterol Cal: 26 mg/dL (ref 5–40)

## 2020-04-24 LAB — HEMOGLOBIN A1C
Est. average glucose Bld gHb Est-mCnc: 278 mg/dL
Hgb A1c MFr Bld: 11.3 % — ABNORMAL HIGH (ref 4.8–5.6)

## 2020-04-24 LAB — HEPATITIS C ANTIBODY: Hep C Virus Ab: <0.1 s/co ratio (ref 0.0–0.9)

## 2020-04-24 LAB — URIC ACID: Uric Acid: 3.3 mg/dL — ABNORMAL LOW (ref 3.8–8.4)

## 2020-04-25 ENCOUNTER — Telehealth: Payer: Self-pay

## 2020-04-25 ENCOUNTER — Other Ambulatory Visit: Payer: Self-pay | Admitting: Physician Assistant

## 2020-04-25 DIAGNOSIS — R3911 Hesitancy of micturition: Secondary | ICD-10-CM

## 2020-04-25 DIAGNOSIS — E118 Type 2 diabetes mellitus with unspecified complications: Secondary | ICD-10-CM

## 2020-04-25 NOTE — Telephone Encounter (Signed)
Patient advised as directed below. Per patient he will cut on the sugar free Gatorade and he agree on adding a new medication.Uses [employer].

## 2020-04-25 NOTE — Telephone Encounter (Signed)
-----   Message from Margaretann Loveless, [place] sent at 04/24/2020  4:16 PM EDT ----- Blood count is stable. Kidney and liver function is normal. Sodium, potassium, and calcium are normal. A1c has greatly increased from 7.8 to 11.3. Will add a new medication to help lower sugar. May need to cut out gatorade if drinking full gatorade. The elevated sugars could be contributing to not feeling well also. Cholesterol is normal. Hepatitis C screen is negative. Uric acid is normal for being on allopurinol.

## 2020-04-27 MED ORDER — DAPAGLIFLOZIN PROPANEDIOL 10 MG PO TABS: 10.0000 mg | ORAL_TABLET | Freq: Every day | ORAL | 1 refills | Status: DC

## 2020-04-27 NOTE — Telephone Encounter (Signed)
Farxiga 10mg  sent in

## 2020-05-11 ENCOUNTER — Other Ambulatory Visit: Payer: Self-pay | Admitting: Physician Assistant

## 2020-05-11 DIAGNOSIS — M2550 Pain in unspecified joint: Secondary | ICD-10-CM

## 2020-05-12 NOTE — Progress Notes (Deleted)
Cardiology Office Note  Date:  05/12/2020   ID:  [name] Gross Sr., DOB [birth year], MRN [id number]  PCP:  Mar Daring, [name]   No chief complaint on file.   HPI:  Mr. Panetta is a very pleasant 79 year old gentleman with  coronary artery disease,  bypass surgery in 2004,  occluded graft to the diagonal branch with 3 other patent grafts to the RCA, OM with a LIMA to the LAD by catheterization in 2007  who presents for routine followup Of his coronary artery disease .   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   Takes Lasix daily, twice daily dosing 3 days a week  EKG on today's visit shows normal sinus rhythm  with rate 83 bpm, ST-T wave abnormality anterolateral and inferior leads, PVCs  Other past medical history reviewed Echocardiogram February 01, 2020 ejection fraction 50 to 67%, Grade 2 diastolic dysfunction, right heart pressures 45 mmHg, hypokinesis basal inferoseptal wall  Stress test reviewed with him in detail, date of study August 02, 2019 Ischemia in the basal and mid inferior and inferoseptal myocardium   PMH:   has a past medical history of Arthritis, Back pain, CAD (coronary artery disease), CKD (chronic kidney disease), stage II-III, Colon polyps, DM2 (diabetes mellitus, type 2) ([place]), GERD (gastroesophageal reflux disease), Glaucoma, Gout, HLD (hyperlipidemia), HTN (hypertension), and Neuropathy.  PSH:    Past Surgical History:  Procedure Laterality Date   BACK SURGERY     Cyst removed   CORONARY ARTERY BYPASS GRAFT  2004   KNEE SURGERY      Current Outpatient Medications  Medication Sig Dispense Refill   allopurinol (ZYLOPRIM) 300 MG tablet TAKE 1 TABLET BY MOUTH ONCE DAILY 90 tablet 3   aspirin 81 MG chewable tablet Chew 81 mg by mouth daily.     atorvastatin (LIPITOR) 20 MG tablet TAKE 1 TABLET BY MOUTH ONCE DAILY (Patient taking differently: Take 20 mg by mouth daily. ) 90 tablet 3   BD SHARPS CONTAINER HOME MISC Dispose of syringes after B12 injections 1 each 5   Blood Glucose Monitoring Suppl (ACCU-CHEK AVIVA PLUS) w/Device KIT To check blood sugar once daily 1 kit 0   carvedilol (COREG) 6.25 MG tablet Take 1 tablet (  6.25 mg total) by mouth 2 (two) times daily. 180 tablet 3  °• dapagliflozin propanediol (FARXIGA) 10 MG TABS tablet Take 1 tablet (10 mg total) by mouth daily before breakfast. 90 tablet 1  °• digoxin (LANOXIN) 0.125 MG tablet TAKE 1 TABLET BY MOUTH ONCE DAILY 90 tablet 1  °• furosemide (LASIX) 20 MG tablet Take 1 tablet (20 mg total) by mouth daily. 90 tablet 3  °• gabapentin (NEURONTIN) 400 MG capsule TAKE ONE CAPSULE BY MOUTH THREE TIMES DAILY 90  capsule 5  °• glipiZIDE (GLUCOTROL XL) 10 MG 24 hr tablet TAKE 1 TABLET BY MOUTH ONCE DAILY WITH BREAKFAST 90 tablet 3  °• glucose blood (ACCU-CHEK AVIVA PLUS) test strip USE AS DIRECTED TO CHECK FASTING BLOOD SUGAR EVERY MORNING 100 each 4  °• isosorbide mononitrate (IMDUR) 30 MG 24 hr tablet Take 1 tablet (30 mg total) by mouth daily. 90 tablet 3  °• lisinopril (ZESTRIL) 20 MG tablet TAKE 1 TABLET BY MOUTH ONCE DAILY 90 tablet 3  °• meloxicam (MOBIC) 7.5 MG tablet TAKE 1 TABLET BY MOUTH TWICE DAILY 180 tablet 0  °• metFORMIN (GLUCOPHAGE) 1000 MG tablet TAKE 1 TABLET BY MOUTH TWICE (2) DAILY WITH A MEAL 180 tablet 1  °• nitroGLYCERIN (NITROSTAT) 0.4 MG SL tablet Place 1 tablet (0.4 mg total) under the tongue every 5 (five) minutes as needed for chest pain. 25 tablet 6  °• potassium chloride (KLOR-CON) 10 MEQ tablet TAKE 1 TABLET BY MOUTH ONCE DAILY 90 tablet 1  °• sitaGLIPtin (JANUVIA) 50 MG tablet Take 1 tablet (50 mg total) by mouth daily. 90 tablet 3  °• SYRINGE-NEEDLE, DISP, 3 ML (B-D SYRINGE/NEEDLE 3CC/25GX5/8) 25G X 5/8" 3 ML MISC For use with cyanocobalamin injections 50 each 0  °• tamsulosin (FLOMAX) 0.4 MG CAPS capsule TAKE 1 CAPSULE BY MOUTH ONCE DAILY 90 capsule 0  ° °No current facility-administered medications for this visit.  ° ° ° °Allergies:   Ramipril  ° °Social History:  The patient  reports that he has never smoked. His smokeless tobacco use includes chew. He reports that he does not drink alcohol and does not use drugs.  ° °Family History:   Family history is unknown by patient.  ° ° °Review of Systems: °Review of Systems  °Constitutional: Negative.   °HENT: Negative.   °Respiratory: Negative.   °Cardiovascular: Positive for chest pain.  °Gastrointestinal: Negative.   °Musculoskeletal: Negative.   °Neurological: Negative.   °Psychiatric/Behavioral: Negative.   °All other systems reviewed and are negative. ° ° °PHYSICAL EXAM: °VS:  There were no vitals taken for this visit. , BMI There is no  height or weight on file to calculate BMI.  °Constitutional:  oriented to person, place, and time. No distress.  °HENT:  °Head: Normocephalic and atraumatic.  °Eyes:  no discharge. No scleral icterus.  °Neck: Normal range of motion. Neck supple. No JVD present.  °Cardiovascular: Normal rate, regular rhythm, normal heart sounds and intact distal pulses. Exam reveals no gallop and no friction rub. No edema °No murmur heard. °Pulmonary/Chest: Effort normal and breath sounds normal. No stridor. No respiratory distress.  no wheezes.  no rales.  no tenderness.  °Abdominal: Soft.  no distension.  no tenderness.  °Musculoskeletal: Normal range of motion.  no  tenderness or deformity.  °Neurological:  normal muscle tone. Coordination normal. No atrophy °Skin: Skin is warm and dry. No rash noted. not diaphoretic.  °Psychiatric:  normal mood and affect. behavior is normal. Thought content normal.  ° ° °  Recent Labs: °04/23/2020: ALT 10; BUN 17; Creatinine, Ser 1.21; Hemoglobin 11.5; Platelets 224; Potassium 4.6; Sodium 135  ° ° °Lipid Panel °Lab Results  °Component Value Date  ° CHOL 115 04/23/2020  ° HDL 32 (L) 04/23/2020  ° LDLCALC 57 04/23/2020  ° TRIG 148 04/23/2020  ° °  ° °Wt Readings from Last 3 Encounters:  °04/23/20 168 lb 12.8 oz (76.6 kg)  °02/06/20 178 lb 8 oz (81 kg)  °01/26/20 179 lb (81.2 kg)  °  ° ° °ASSESSMENT AND PLAN: ° °CAD with stable angina °Echocardiogram with region of hypokinesis basal inferior wall ° stress test showing ischemia basal inferior to mid inferior wall °On prior office visit with asymptomatic, no catheterization was scheduled °Previously discussed with patient's daughter on prior office visit °-Non-smoker, cholesterol at goal °On today's visit a mixed picture unable to exclude angina versus atypical chest discomfort from using the hoe in the garden °-Often able to work without any chest pain, he thinks that this is medications in the morning °-For now we will treat his blood pressure, Imdur  up to 30 twice daily given high pressure, Coreg up to 6.25 twice daily °Lasix down to daily not twice daily 3 days a week/with daily dosing ° °essential hypertension - Plan: EKG 12-Lead °Changes as above, recommend he monitor blood pressure and call our office °Certainly possible of high blood pressure contributing to chest tightness ° °Type 2 diabetes mellitus with complication, without long-term current use of insulin (HCC) °Eating more citrus at home °A1c 7.8 ° ° Total encounter time more than 25 minutes ° Greater than 50% was spent in counseling and coordination of care with the patient ° ° ° ° °No orders of the defined types were placed in this encounter. ° ° ° °Signed, °Tim Gollan, M.D., Ph.D. °05/12/2020  °[facility], [place] °[phone] ° °

## 2020-05-14 ENCOUNTER — Ambulatory Visit: Payer: Medicare Other | Admitting: Cardiovascular Disease

## 2020-05-14 DIAGNOSIS — I10 Essential (primary) hypertension: Secondary | ICD-10-CM

## 2020-05-14 DIAGNOSIS — N182 Chronic kidney disease, stage 2 (mild): Secondary | ICD-10-CM

## 2020-05-14 DIAGNOSIS — E785 Hyperlipidemia, unspecified: Secondary | ICD-10-CM

## 2020-05-14 DIAGNOSIS — I25118 Atherosclerotic heart disease of native coronary artery with other forms of angina pectoris: Secondary | ICD-10-CM

## 2020-05-14 DIAGNOSIS — E118 Type 2 diabetes mellitus with unspecified complications: Secondary | ICD-10-CM

## 2020-05-15 NOTE — Progress Notes (Signed)
Cardiology Office Note  Date:  05/16/2020   ID:  [name] Gross Sr., DOB Jun 25, 1941, MRN [id number]  PCP:  Mar Daring, [name]   Chief Complaint  Patient presents with  . Follow-up    3 month  P states no Sx.    HPI:  [name] is a very pleasant 79 year old gentleman with  coronary artery disease,  bypass surgery in 2004,  occluded graft to the diagonal branch with 3 other patent grafts to the RCA, OM with a LIMA to the LAD by catheterization in 2007  who presents for routine followup Of his coronary artery disease .   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, has to pick it up  Lab work reviewed HBA1C 7.8, up to 11.3 Was told by primary care to cut back on his Gatorade CR 1.08 HCT 35  EKG on today's visit shows normal sinus rhythm with rate 64 bpm, ST-T wave abnormality anterolateral and inferior leads, PVCs  Other past medical history reviewed Echocardiogram February 01, 2020 ejection fraction 50 to 60%, Grade 2 diastolic dysfunction, right heart pressures 45 mmHg, hypokinesis basal inferoseptal wall  Stress test reviewed with him in detail, date of study August 02, 2019 Ischemia in the basal and mid inferior and inferoseptal  myocardium   PMH:   has a past medical history of Arthritis, Back pain, CAD (coronary artery disease), CKD (chronic kidney disease), stage II-III, Colon polyps, DM2 (diabetes mellitus, type 2) ([place]), GERD (gastroesophageal reflux disease), Glaucoma, Gout, HLD (hyperlipidemia), HTN (hypertension), and Neuropathy.  PSH:    Past Surgical History:  Procedure Laterality Date  . BACK SURGERY     Cyst removed  . CORONARY ARTERY BYPASS GRAFT  2004  . KNEE SURGERY      Current Outpatient Medications  Medication Sig Dispense Refill  . allopurinol (ZYLOPRIM) 300 MG tablet TAKE 1 TABLET BY MOUTH ONCE DAILY 90 tablet 3  . aspirin 81 MG chewable tablet Chew 81 mg by mouth daily.    Marland Kitchen atorvastatin (LIPITOR) 20 MG tablet TAKE 1 TABLET BY MOUTH ONCE DAILY (Patient taking differently: Take 20 mg by mouth daily. ) 90 tablet 3  . BD SHARPS CONTAINER HOME MISC Dispose of syringes after B12 injections 1 each 5  . Blood Glucose Monitoring Suppl (ACCU-CHEK AVIVA PLUS) w/Device KIT To check blood sugar once daily 1 kit 0  . carvedilol (COREG) 6.25 MG tablet Take 1 tablet (6.25 mg total) by mouth 2 (two) times daily. 180 tablet 3  . dapagliflozin propanediol (FARXIGA) 10 MG TABS tablet Take 1 tablet (10 mg total) by mouth daily before breakfast. 90 tablet 1  . digoxin (LANOXIN) 0.125 MG tablet TAKE 1 TABLET BY MOUTH ONCE DAILY 90 tablet 1  . gabapentin (  NEURONTIN) 400 MG capsule TAKE ONE CAPSULE BY MOUTH THREE TIMES DAILY 90 capsule 5  . glipiZIDE (GLUCOTROL XL) 10 MG 24 hr tablet TAKE 1 TABLET BY MOUTH ONCE DAILY WITH BREAKFAST 90 tablet 3  . glucose blood (ACCU-CHEK AVIVA PLUS) test strip USE AS DIRECTED TO CHECK FASTING BLOOD SUGAR EVERY MORNING 100 each 4  . isosorbide mononitrate (IMDUR) 30 MG 24 hr tablet Take 1 tablet (30 mg total) by mouth daily. 90 tablet 3  . lisinopril (ZESTRIL) 20 MG tablet TAKE 1 TABLET BY MOUTH ONCE DAILY 90 tablet 3  . meloxicam (MOBIC) 7.5 MG tablet TAKE 1 TABLET BY MOUTH TWICE  DAILY 180 tablet 0  . metFORMIN (GLUCOPHAGE) 1000 MG tablet TAKE 1 TABLET BY MOUTH TWICE (2) DAILY WITH A MEAL 180 tablet 1  . nitroGLYCERIN (NITROSTAT) 0.4 MG SL tablet Place 1 tablet (0.4 mg total) under the tongue every 5 (five) minutes as needed for chest pain. 25 tablet 6  . potassium chloride (KLOR-CON) 10 MEQ tablet TAKE 1 TABLET BY MOUTH ONCE DAILY 90 tablet 1  . sitaGLIPtin (JANUVIA) 50 MG tablet Take 1 tablet (50 mg total) by mouth daily. 90 tablet 3  . SYRINGE-NEEDLE, DISP, 3 ML (B-D SYRINGE/NEEDLE 3CC/25GX5/8) 25G X 5/8" 3 ML MISC For use with cyanocobalamin injections 50 each 0  . tamsulosin (FLOMAX) 0.4 MG CAPS capsule TAKE 1 CAPSULE BY MOUTH ONCE DAILY 90 capsule 0  . furosemide (LASIX) 20 MG tablet Take 1 tablet (20 mg total) by mouth daily. 90 tablet 3   No current facility-administered medications for this visit.     Allergies:   Ramipril   Social History:  The patient  reports that he has never smoked. His smokeless tobacco use includes chew. He reports that he does not drink alcohol and does not use drugs.   Family History:   Family history is unknown by patient.    Review of Systems: Review of Systems  Constitutional: Negative.   HENT: Negative.   Respiratory: Negative.   Cardiovascular: Positive for chest pain.  Gastrointestinal: Negative.   Musculoskeletal: Negative.   Neurological: Negative.   Psychiatric/Behavioral: Negative.   All other systems reviewed and are negative.   PHYSICAL EXAM: VS:  BP 138/84   Pulse 64   Ht '5\' 8"'  (1.727 m)   Wt 173 lb (78.5 kg)   BMI 26.30 kg/m  , BMI Body mass index is 26.3 kg/m.  Constitutional:  oriented to person, place, and time. No distress.  HENT:  Head: Grossly normal Eyes:  no discharge. No scleral icterus.  Neck: No JVD, no carotid bruits  Cardiovascular: Regular rate and rhythm, no murmurs appreciated Pulmonary/Chest: Clear to auscultation bilaterally, no wheezes or rails Abdominal: Soft.  no distension.   no tenderness.  Musculoskeletal: Normal range of motion Neurological:  normal muscle tone. Coordination normal. No atrophy Skin: Skin warm and dry Psychiatric: normal affect, pleasant   Recent Labs: 04/23/2020: ALT 10; BUN 17; Creatinine, Ser 1.21; Hemoglobin 11.5; Platelets 224; Potassium 4.6; Sodium 135    Lipid Panel Lab Results  Component Value Date   CHOL 115 04/23/2020   HDL 32 (L) 04/23/2020   LDLCALC 57 04/23/2020   TRIG 148 04/23/2020    Wt Readings from Last 3 Encounters:  05/16/20 173 lb (78.5 kg)  04/23/20 168 lb 12.8 oz (76.6 kg)  02/06/20 178 lb 8 oz (81 kg)      ASSESSMENT AND PLAN:  CAD with stable angina Very active, no angina with exertion  Pulling up >200 bales of hay, no sx Medical management at this time NTG for chest pain, has not used in years  essential hypertension - Plan: EKG 12-Lead Blood pressure is well controlled on today's visit. No changes made to the medications.  Type 2 diabetes mellitus with complication, without long-term current use of insulin (HCC) A1c 7.8 up to >11 From drinks/citrus Changed his fluids    Total encounter time more than 25 minutes  Greater than 50% was spent in counseling and coordination of care with the patient   No orders of the defined types were placed in this encounter.    Signed, Esmond Plants, M.D., Ph.D. 05/16/2020  [place], [place]

## 2020-05-16 ENCOUNTER — Other Ambulatory Visit: Payer: Self-pay

## 2020-05-16 ENCOUNTER — Encounter: Payer: Self-pay | Admitting: Cardiovascular Disease

## 2020-05-16 ENCOUNTER — Ambulatory Visit: Payer: Medicare Other | Admitting: Cardiovascular Disease

## 2020-05-16 VITALS — BP 138/84 | HR 64 | Ht 68.0 in | Wt 173.0 lb

## 2020-05-16 DIAGNOSIS — G629 Polyneuropathy, unspecified: Secondary | ICD-10-CM

## 2020-05-16 DIAGNOSIS — I25118 Atherosclerotic heart disease of native coronary artery with other forms of angina pectoris: Secondary | ICD-10-CM | POA: Diagnosis not present

## 2020-05-16 DIAGNOSIS — E118 Type 2 diabetes mellitus with unspecified complications: Secondary | ICD-10-CM | POA: Diagnosis not present

## 2020-05-16 DIAGNOSIS — I10 Essential (primary) hypertension: Secondary | ICD-10-CM | POA: Diagnosis not present

## 2020-05-16 DIAGNOSIS — E785 Hyperlipidemia, unspecified: Secondary | ICD-10-CM

## 2020-05-16 DIAGNOSIS — N182 Chronic kidney disease, stage 2 (mild): Secondary | ICD-10-CM

## 2020-05-16 NOTE — Patient Instructions (Signed)
Medication Instructions:  No changes  If you need a refill on your cardiac medications before your next appointment, please call your pharmacy.    Lab work: No new labs needed   If you have labs (blood work) drawn today and your tests are completely normal, you will receive your results only by: . MyChart Message (if you have MyChart) OR . A paper copy in the mail If you have any lab test that is abnormal or we need to change your treatment, we will call you to review the results.   Testing/Procedures: No new testing needed   Follow-Up: At [facility], you and your health needs are our priority.  As part of our continuing mission to provide you with exceptional heart care, we have created designated Provider Care Teams.  These Care Teams include your primary Cardiologist (physician) and Advanced Practice Providers (APPs -  Physician Assistants and Nurse Practitioners) who all work together to provide you with the care you need, when you need it.  . You will need a follow up appointment in 12 months  . Providers on your designated Care Team:   . Christopher Berge, NP . Ryan Dunn, [name] . Jacquelyn Visser, [name]  Any Other Special Instructions Will Be Listed Below (If Applicable).  COVID-19 Vaccine Information can be found at: [email or web address] For questions related to vaccine distribution or appointments, please email [email or web address] or call [phone].     

## 2020-05-31 ENCOUNTER — Other Ambulatory Visit: Payer: Self-pay | Admitting: Physician Assistant

## 2020-05-31 DIAGNOSIS — R600 Localized edema: Secondary | ICD-10-CM

## 2020-06-01 ENCOUNTER — Other Ambulatory Visit: Payer: Self-pay | Admitting: Physician Assistant

## 2020-06-01 DIAGNOSIS — I482 Chronic atrial fibrillation, unspecified: Secondary | ICD-10-CM

## 2020-07-06 ENCOUNTER — Other Ambulatory Visit: Payer: Self-pay | Admitting: Physician Assistant

## 2020-07-06 DIAGNOSIS — E119 Type 2 diabetes mellitus without complications: Secondary | ICD-10-CM

## 2020-07-06 NOTE — Telephone Encounter (Signed)
Future appt scheduled.  

## 2020-07-13 ENCOUNTER — Other Ambulatory Visit: Payer: Self-pay | Admitting: Cardiovascular Disease

## 2020-07-23 NOTE — Progress Notes (Signed)
Established patient visit   Patient: [name] Ensminger Sr.   DOB: 1941/07/16   79 y.o. Male  MRN: [id number] Visit Date: 07/25/2020  Today's healthcare provider: Mar Daring, [name]   Chief Complaint  Patient presents with  . Follow-up   Subjective    HPI  Hypertension, follow-up  BP Readings from Last 3 Encounters:  07/25/20 (!) 136/57  05/16/20 138/84  04/23/20 (!) 103/49   Wt Readings from Last 3 Encounters:  07/25/20 167 lb (75.8 kg)  05/16/20 173 lb (78.5 kg)  04/23/20 168 lb 12.8 oz (76.6 kg)     He was last seen for hypertension 3 months ago.  BP at that visit was 103/49. Management since that visit includes Will decrease Imdur back to once daily dosing and leave Carvedilol at BID dosing. Push fluids.   He reports excellent compliance with treatment. He is not having side effects. Reports he still getting dizzy. He is following a Regular diet. He is not exercising. He does not smoke.  Outside blood pressures are not being checked. Symptoms: No chest pain No chest pressure  No palpitations No syncope  No dyspnea No orthopnea  No paroxysmal nocturnal dyspnea Yes lower extremity edema   Pertinent labs: Lab Results  Component Value Date   CHOL 115 04/23/2020   HDL 32 (L) 04/23/2020   LDLCALC 57 04/23/2020   TRIG 148 04/23/2020   CHOLHDL 3.6 04/23/2020   Lab Results  Component Value Date   NA 135 04/23/2020   K 4.6 04/23/2020   CREATININE 1.21 04/23/2020   GFRNONAA 57 (L) 04/23/2020   GFRAA 66 04/23/2020   GLUCOSE 281 (H) 04/23/2020     The ASCVD Risk score (Goff DC Jr., et al., 2013) failed to calculate for the following reasons:   The valid total cholesterol range is 130 to 320 mg/dL   --------------------------------------------------------------------------------------------------- Diabetes Mellitus Type II, Follow-up  Lab Results  Component Value Date   HGBA1C 7.4 (A) 07/25/2020   HGBA1C 11.3 (H) 04/23/2020   HGBA1C 7.8  (A) 01/26/2020   Wt Readings from Last 3 Encounters:  07/25/20 167 lb (75.8 kg)  05/16/20 173 lb (78.5 kg)  04/23/20 168 lb 12.8 oz (76.6 kg)   Last seen for diabetes 3 months ago.  Management since then includes Continue Glipizide XR 26m daily, Januvia 515mdaily, metformin 100017mID.Will add a new medication to help lower sugar. He reports excellent compliance with treatment. He is not having side effects.  Stopped drinking gatorade and changed from regular potatoes to sweet potatoes.   Symptoms: No fatigue No foot ulcerations  No appetite changes No nausea  No paresthesia of the feet  No polydipsia  No polyuria Yes visual disturbances   No vomiting     Home blood sugar records: fasting range: 120's  Episodes of hypoglycemia? Yes 48. This has happened while he is working and depends on what he ate for breakfast.    Pertinent Labs: Lab Results  Component Value Date   CHOL 115 04/23/2020   HDL 32 (L) 04/23/2020   LDLCALC 57 04/23/2020   TRIG 148 04/23/2020   CHOLHDL 3.6 04/23/2020   Lab Results  Component Value Date   NA 135 04/23/2020   K 4.6 04/23/2020   CREATININE 1.21 04/23/2020   GFRNONAA 57 (L) 04/23/2020   GFRAA 66 04/23/2020   GLUCOSE 281 (H) 04/23/2020     --------------------------------------------------------------------------------------------------- Patient Active Problem List   Diagnosis Date Noted  . Orthostatic hypotension 04/23/2020  .  RBBB 12/27/2019  . CKD (chronic kidney disease), stage II   . 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 Primary osteoarthritis of both knees 01/20/2018  . CAD (coronary artery disease), native coronary artery 08/04/2017  . Acute pain of left knee 06/12/2017  . Sebaceous cyst 09/10/2016  . Neuropathy 05/22/2016  . Type 2 diabetes mellitus with complication, without long-term current use of insulin ([place]) 11/07/2009  . Mixed hyperlipidemia 11/07/2009  . GOUT, UNSPECIFIED 11/07/2009  . Essential hypertension 11/07/2009  .  UNSTABLE ANGINA 11/07/2009  . 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 GERD 11/07/2009  . Backache 11/07/2009   Social History   Tobacco Use  . Smoking status: Never Smoker  . Smokeless tobacco: Current User    Types: Chew  . Tobacco comment: chew tobacco - 1 pack last 3 weeks  Vaping Use  . Vaping Use: Never used  Substance Use Topics  . Alcohol use: No    Alcohol/week: 0.0 standard drinks  . Drug use: No   Allergies  Allergen Reactions  . Ramipril        Medications: Outpatient Medications Prior to Visit  Medication Sig  . allopurinol (ZYLOPRIM) 300 MG tablet TAKE 1 TABLET BY MOUTH ONCE DAILY  . aspirin 81 MG chewable tablet Chew 81 mg by mouth daily.  Marland Kitchen atorvastatin (LIPITOR) 20 MG tablet TAKE 1 TABLET BY MOUTH ONCE DAILY  . BD SHARPS CONTAINER HOME MISC Dispose of syringes after B12 injections  . Blood Glucose Monitoring Suppl (ACCU-CHEK AVIVA PLUS) w/Device KIT To check blood sugar once daily  . carvedilol (COREG) 6.25 MG tablet Take 1 tablet (6.25 mg total) by mouth 2 (two) times daily.  . dapagliflozin propanediol (FARXIGA) 10 MG TABS tablet Take 1 tablet (10 mg total) by mouth daily before breakfast.  . digoxin (LANOXIN) 0.125 MG tablet TAKE 1 TABLET BY MOUTH ONCE DAILY  . furosemide (LASIX) 20 MG tablet Take 1 tablet (20 mg total) by mouth daily.  Marland Kitchen glipiZIDE (GLUCOTROL XL) 10 MG 24 hr tablet TAKE 1 TABLET BY MOUTH ONCE DAILY WITH BREAKFAST  . glucose blood (ACCU-CHEK AVIVA PLUS) test strip USE AS DIRECTED TO CHECK FASTING BLOOD SUGAR EVERY MORNING  . isosorbide mononitrate (IMDUR) 30 MG 24 hr tablet Take 1 tablet (30 mg total) by mouth daily.  Marland Kitchen lisinopril (ZESTRIL) 20 MG tablet TAKE 1 TABLET BY MOUTH ONCE DAILY  . meloxicam (MOBIC) 7.5 MG tablet TAKE 1 TABLET BY MOUTH TWICE DAILY  . metFORMIN (GLUCOPHAGE) 1000 MG tablet TAKE 1 TABLET BY MOUTH TWICE (2) DAILY WITH A MEAL  . nitroGLYCERIN (NITROSTAT) 0.4 MG SL tablet Place 1 tablet (0.4 mg total)  under the tongue every 5 (five) minutes as needed for chest pain.  . potassium chloride (KLOR-CON) 10 MEQ tablet TAKE 1 TABLET BY MOUTH ONCE DAILY  . sitaGLIPtin (JANUVIA) 50 MG tablet Take 1 tablet (50 mg total) by mouth daily.  . SYRINGE-NEEDLE, DISP, 3 ML (B-D SYRINGE/NEEDLE 3CC/25GX5/8) 25G X 5/8" 3 ML MISC For use with cyanocobalamin injections  . [DISCONTINUED] gabapentin (NEURONTIN) 400 MG capsule TAKE ONE CAPSULE BY MOUTH THREE TIMES DAILY  . [DISCONTINUED] tamsulosin (FLOMAX) 0.4 MG CAPS capsule TAKE 1 CAPSULE BY MOUTH ONCE DAILY   No facility-administered medications prior to visit.    Review of Systems  Constitutional: Negative.   Eyes: Negative.   Respiratory: Negative.   Cardiovascular: Negative.   Gastrointestinal: Negative.   Endocrine: Negative.   Neurological: Negative.     Last CBC Lab Results  Component Value Date   WBC 7.4 04/23/2020   HGB 11.5 (L) 04/23/2020   HCT 35.0 (L) 04/23/2020   MCV 90 04/23/2020   MCH 29.6 04/23/2020   RDW 12.8 04/23/2020   PLT 224 04/23/2020   Last thyroid functions Lab Results  Component Value Date   TSH 3.840 04/21/2018      Objective    BP (!) 136/57 (BP Location: Left Arm, Patient Position: Sitting, Cuff Size: Large)   Pulse (!) 56   Temp (!) 97.2 F (36.2 C) (Oral)   Resp 16   Wt 167 lb (75.8 kg)   BMI 25.39 kg/m  BP Readings from Last 3 Encounters:  07/25/20 (!) 136/57  05/16/20 138/84  04/23/20 (!) 103/49   Wt Readings from Last 3 Encounters:  07/25/20 167 lb (75.8 kg)  05/16/20 173 lb (78.5 kg)  04/23/20 168 lb 12.8 oz (76.6 kg)      Physical Exam Vitals reviewed.  Constitutional:      General: He is not in acute distress.    Appearance: Normal appearance. He is well-developed, well-groomed, overweight and well-nourished. He is not diaphoretic.  HENT:     Head: Normocephalic and atraumatic.  Cardiovascular:     Rate and Rhythm: Normal rate and regular rhythm.     Heart sounds: Normal heart  sounds. No murmur heard. No friction rub. No gallop.   Pulmonary:     Effort: Pulmonary effort is normal. No respiratory distress.     Breath sounds: Normal breath sounds. No wheezing or rales.  Musculoskeletal:     Cervical back: Normal range of motion and neck supple.  Neurological:     Mental Status: He is alert.  Psychiatric:        Behavior: Behavior is cooperative.       Results for orders placed or performed in visit on 07/25/20  POCT glycosylated hemoglobin (Hb A1C)  Result Value Ref Range   Hemoglobin A1C 7.4 (A) 4.0 - 5.6 %   Est. average glucose Bld gHb Est-mCnc 166     Assessment & Plan     1. Type 2 diabetes mellitus with stage 2 chronic kidney disease, without long-term current use of insulin (HCC) A1c much improved to 7.4. Continue current medical treatment plan. Return in 3 months for A1c recheck.  - POCT glycosylated hemoglobin (Hb A1C)  2. Essential hypertension Stable. Followed by Cardiology.   3. CKD (chronic kidney disease), stage II Stable. Push fluids. Avoid NSAIDs if possible.   4. Benign prostatic hyperplasia with urinary hesitancy Stable. Diagnosis pulled for medication refill. Continue current medical treatment plan - tamsulosin (FLOMAX) 0.4 MG CAPS capsule; Take 1 capsule (0.4 mg total) by mouth daily.  Dispense: 90 capsule; Refill: 3  5. Neuropathy Stable. Diagnosis pulled for medication refill. Continue current medical treatment plan. - gabapentin (NEURONTIN) 400 MG capsule; Take 1 capsule (400 mg total) by mouth 3 (three) times daily.  Dispense: 90 capsule; Refill: 5   Return in about 3 months (around 10/23/2020) for t2dm.      Reynolds Bowl, [name], have reviewed all documentation for this visit. The documentation on 07/25/20 for the exam, diagnosis, procedures, and orders are all accurate and complete.   Rubye Beach  [facility] [phone] (phone) [phone] (fax)  [place]

## 2020-07-24 NOTE — Patient Instructions (Signed)

## 2020-07-25 ENCOUNTER — Other Ambulatory Visit: Payer: Self-pay

## 2020-07-25 ENCOUNTER — Ambulatory Visit (INDEPENDENT_AMBULATORY_CARE_PROVIDER_SITE_OTHER): Payer: Medicare Other | Admitting: Physician Assistant

## 2020-07-25 ENCOUNTER — Encounter: Payer: Self-pay | Admitting: Physician Assistant

## 2020-07-25 VITALS — BP 136/57 | HR 56 | Temp 97.2°F | Resp 16 | Wt 167.0 lb

## 2020-07-25 DIAGNOSIS — E1122 Type 2 diabetes mellitus with diabetic chronic kidney disease: Secondary | ICD-10-CM

## 2020-07-25 DIAGNOSIS — G629 Polyneuropathy, unspecified: Secondary | ICD-10-CM

## 2020-07-25 DIAGNOSIS — R3911 Hesitancy of micturition: Secondary | ICD-10-CM

## 2020-07-25 DIAGNOSIS — N182 Chronic kidney disease, stage 2 (mild): Secondary | ICD-10-CM

## 2020-07-25 DIAGNOSIS — I10 Essential (primary) hypertension: Secondary | ICD-10-CM | POA: Diagnosis not present

## 2020-07-25 DIAGNOSIS — N401 Enlarged prostate with lower urinary tract symptoms: Secondary | ICD-10-CM | POA: Diagnosis not present

## 2020-07-25 LAB — POCT GLYCOSYLATED HEMOGLOBIN (HGB A1C)
Est. average glucose Bld gHb Est-mCnc: 166
Hemoglobin A1C: 7.4 % — AB (ref 4.0–5.6)

## 2020-07-25 MED ORDER — GABAPENTIN 400 MG PO CAPS: 400.0000 mg | ORAL_CAPSULE | Freq: Three times a day (TID) | ORAL | 5 refills | Status: DC

## 2020-07-25 MED ORDER — TAMSULOSIN HCL 0.4 MG PO CAPS: 0.4000 mg | ORAL_CAPSULE | Freq: Every day | ORAL | 3 refills | Status: DC

## 2020-08-24 ENCOUNTER — Other Ambulatory Visit: Payer: Self-pay | Admitting: Physician Assistant

## 2020-08-24 DIAGNOSIS — M2550 Pain in unspecified joint: Secondary | ICD-10-CM

## 2020-09-03 ENCOUNTER — Other Ambulatory Visit: Payer: Self-pay | Admitting: Physician Assistant

## 2020-09-03 DIAGNOSIS — E118 Type 2 diabetes mellitus with unspecified complications: Secondary | ICD-10-CM

## 2020-09-10 ENCOUNTER — Other Ambulatory Visit: Payer: Self-pay | Admitting: Physician Assistant

## 2020-09-10 DIAGNOSIS — I482 Chronic atrial fibrillation, unspecified: Secondary | ICD-10-CM

## 2020-09-13 ENCOUNTER — Other Ambulatory Visit: Payer: Self-pay | Admitting: Physician Assistant

## 2020-09-13 DIAGNOSIS — E118 Type 2 diabetes mellitus with unspecified complications: Secondary | ICD-10-CM

## 2020-09-13 NOTE — Telephone Encounter (Signed)
Requested Prescriptions  Pending Prescriptions Disp Refills  . glipiZIDE (GLUCOTROL XL) 10 MG 24 hr tablet [Pharmacy Med Name: GLIPIZIDE ER 10 MG TAB] 90 tablet 3    Sig: TAKE 1 TABLET BY MOUTH ONCE DAILY WITH BREAKFAST     Endocrinology:  Diabetes - Sulfonylureas Passed - 09/13/2020  1:18 PM      Passed - HBA1C is between 0 and 7.9 and within 180 days    Hemoglobin A1C  Date Value Ref Range Status  07/25/2020 7.4 (A) 4.0 - 5.6 % Final   Hgb A1c MFr Bld  Date Value Ref Range Status  04/23/2020 11.3 (H) 4.8 - 5.6 % Final    Comment:             Prediabetes: 5.7 - 6.4          Diabetes: >6.4          Glycemic control for adults with diabetes: <7.0          Passed - Valid encounter within last 6 months    Recent Outpatient Visits          1 month ago Type 2 diabetes mellitus with stage 2 chronic kidney disease, without long-term current use of insulin [facility])   [facility] [place], [place], [name]   7 months ago Type 2 diabetes mellitus with stage 2 chronic kidney disease, without long-term current use of insulin [facility])   [facility] [place], [place]   8 months ago CKD (chronic kidney disease), stage II   [facility] Joycelyn Man M, [name]   10 months ago Type 2 diabetes mellitus with stage 2 chronic kidney disease, without long-term current use of insulin [facility])   [facility] [place], [place], [place]   1 year ago Suspected COVID-19 virus infection   [facility] [place], Alessandra Bevels, [place]      Future Appointments            In 1 month [place], Alessandra Bevels, [name] [name]

## 2020-09-19 ENCOUNTER — Telehealth: Payer: Self-pay

## 2020-09-19 NOTE — Telephone Encounter (Signed)
Copied from CRM [id number]. Topic: General - Other >> Sep 19, 2020 10:46 AM Marylen Ponto wrote: Reason for CRM: Pt requests that [name] calls him back regarding his medications. Pt he has some concerns because he was told one of the medications could kill him. Pt stated he does not have the name of the medication but would like Victorino Dike to call him back. Cb# [phone]

## 2020-09-19 NOTE — Telephone Encounter (Signed)
Copied from CRM [id number]. Topic: General - Other >> Sep 19, 2020  4:27 PM Tamela Oddi wrote: Reason for CRM: Patient called to ask the nurse or doctor whether he can take his two meds together, dapagliflozin propanediol (FARXIGA) 10 MG TABS tablet and meloxicam (MOBIC) 7.5 MG tablet.  He is not sure if he should and would like to discuss it with the doctor.  Please call patient back at [phone]

## 2020-09-19 NOTE — Telephone Encounter (Signed)
Yes he can take them together at the same time.

## 2020-09-20 NOTE — Telephone Encounter (Signed)
I think the viagra would more interact with his Imdur is probably what Dr. Mariah Milling was telling him. Viagra and Imdur can cause really low BP that could be life threatening. With reading on Nugenix it is more natural and did not have anything I could find that would interact as much.

## 2020-09-20 NOTE — Telephone Encounter (Signed)
I called patient to give him this message.  He said what he really wanted to know was if he could take Nugenix with these 2 medications. Per the patient he asked Dr Mariah Milling if he could have Viagra and patient said he was told that taking it with these 2 medicines would kill him.  Patient said Dr Mariah Milling told him he needed to take Nugenix.  Patient got the Nugenix and is now worried that maybe he shouldn't take it with these medicines.  He tried calling Dr Mariah Milling and is unable to get a message to him.   Would like you to let him know if it is ok Thanks

## 2020-09-21 NOTE — Telephone Encounter (Signed)
Advised patient as below.  

## 2020-10-04 ENCOUNTER — Other Ambulatory Visit: Payer: Self-pay | Admitting: Cardiovascular Disease

## 2020-10-15 ENCOUNTER — Other Ambulatory Visit: Payer: Self-pay | Admitting: Physician Assistant

## 2020-10-15 DIAGNOSIS — E119 Type 2 diabetes mellitus without complications: Secondary | ICD-10-CM

## 2020-10-22 ENCOUNTER — Other Ambulatory Visit: Payer: Self-pay

## 2020-10-22 ENCOUNTER — Encounter: Payer: Self-pay | Admitting: Physician Assistant

## 2020-10-22 ENCOUNTER — Ambulatory Visit (INDEPENDENT_AMBULATORY_CARE_PROVIDER_SITE_OTHER): Payer: Medicare Other | Admitting: Physician Assistant

## 2020-10-22 ENCOUNTER — Ambulatory Visit: Payer: Self-pay | Admitting: Physician Assistant

## 2020-10-22 VITALS — BP 154/56 | HR 53 | Temp 98.4°F | Resp 16 | Wt 172.0 lb

## 2020-10-22 DIAGNOSIS — I10 Essential (primary) hypertension: Secondary | ICD-10-CM | POA: Diagnosis not present

## 2020-10-22 DIAGNOSIS — E119 Type 2 diabetes mellitus without complications: Secondary | ICD-10-CM

## 2020-10-22 DIAGNOSIS — G629 Polyneuropathy, unspecified: Secondary | ICD-10-CM

## 2020-10-22 DIAGNOSIS — N182 Chronic kidney disease, stage 2 (mild): Secondary | ICD-10-CM

## 2020-10-22 DIAGNOSIS — E1122 Type 2 diabetes mellitus with diabetic chronic kidney disease: Secondary | ICD-10-CM

## 2020-10-22 DIAGNOSIS — E118 Type 2 diabetes mellitus with unspecified complications: Secondary | ICD-10-CM

## 2020-10-22 LAB — POCT GLYCOSYLATED HEMOGLOBIN (HGB A1C)
Est. average glucose Bld gHb Est-mCnc: 163
Hemoglobin A1C: 7.3 % — AB (ref 4.0–5.6)

## 2020-10-22 MED ORDER — GABAPENTIN 400 MG PO CAPS: 400.0000 mg | ORAL_CAPSULE | Freq: Three times a day (TID) | ORAL | 5 refills | Status: DC

## 2020-10-22 MED ORDER — DAPAGLIFLOZIN PROPANEDIOL 10 MG PO TABS: 10.0000 mg | ORAL_TABLET | Freq: Every day | ORAL | 1 refills | Status: DC

## 2020-10-22 MED ORDER — METFORMIN HCL 1000 MG PO TABS: ORAL_TABLET | ORAL | 3 refills | Status: DC

## 2020-10-22 NOTE — Progress Notes (Signed)
Established patient visit   Patient: [name].   DOB: 1940-12-15   80 y.o. Male  MRN: [id number] Visit Date: 10/22/2020  Today's healthcare provider: Mar Daring, [name]   Chief Complaint  Patient presents with  . Diabetes  . Hypertension   Subjective    HPI  Diabetes Mellitus Type II, Follow-up  Lab Results  Component Value Date   HGBA1C 7.3 (A) 10/22/2020   HGBA1C 7.4 (A) 07/25/2020   HGBA1C 11.3 (H) 04/23/2020   Wt Readings from Last 3 Encounters:  10/22/20 172 lb (78 kg)  07/25/20 167 lb (75.8 kg)  05/16/20 173 lb (78.5 kg)   Last seen for diabetes 3 months ago.  Management since then includes no changes. He reports excellent compliance with treatment. He is not having side effects.  Symptoms: No fatigue No foot ulcerations  No appetite changes No nausea  No paresthesia of the feet  No polydipsia  No polyuria No visual disturbances   No vomiting     Home blood sugar records: fasting range: 110's  Episodes of hypoglycemia? No    Current insulin regiment: none Most Recent Eye Exam: No Current exercise: walking and yard work Current diet habits: in general, a "healthy" diet    Pertinent Labs: Lab Results  Component Value Date   CHOL 115 04/23/2020   HDL 32 (L) 04/23/2020   LDLCALC 57 04/23/2020   TRIG 148 04/23/2020   CHOLHDL 3.6 04/23/2020   Lab Results  Component Value Date   NA 135 04/23/2020   K 4.6 04/23/2020   CREATININE 1.21 04/23/2020   GFRNONAA 57 (L) 04/23/2020   GFRAA 66 04/23/2020   GLUCOSE 281 (H) 04/23/2020     --------------------------------------------------------------------------------------------------- Hypertension, follow-up  BP Readings from Last 3 Encounters:  10/22/20 (!) 154/56  07/25/20 (!) 136/57  05/16/20 138/84   Wt Readings from Last 3 Encounters:  10/22/20 172 lb (78 kg)  07/25/20 167 lb (75.8 kg)  05/16/20 173 lb (78.5 kg)     He was last seen for hypertension 3 months ago.   BP at that visit was 136/57. Management since that visit includes no changes, F/B Cardio.  He reports excellent compliance with treatment. He is not having side effects.  He is following a Regular diet. He is not exercising. He does not smoke.  Use of agents associated with hypertension: none.   Outside blood pressures are stable. Symptoms: No chest pain No chest pressure  No palpitations No syncope  No dyspnea No orthopnea  No paroxysmal nocturnal dyspnea Yes lower extremity edema   Pertinent labs: Lab Results  Component Value Date   CHOL 115 04/23/2020   HDL 32 (L) 04/23/2020   LDLCALC 57 04/23/2020   TRIG 148 04/23/2020   CHOLHDL 3.6 04/23/2020   Lab Results  Component Value Date   NA 135 04/23/2020   K 4.6 04/23/2020   CREATININE 1.21 04/23/2020   GFRNONAA 57 (L) 04/23/2020   GFRAA 66 04/23/2020   GLUCOSE 281 (H) 04/23/2020     The ASCVD Risk score (Goff DC Jr., et al., 2013) failed to calculate for the following reasons:   The valid total cholesterol range is 130 to 320 mg/dL   ---------------------------------------------------------------------------------------------------   Patient Active Problem List   Diagnosis Date Noted  . Orthostatic hypotension 04/23/2020  . RBBB 12/27/2019  . CKD (chronic kidney disease), stage II   . 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 Primary osteoarthritis of both knees 01/20/2018  . CAD (  coronary artery disease), native coronary artery 08/04/2017  . Acute pain of left knee 06/12/2017  . Sebaceous cyst 09/10/2016  . Neuropathy 05/22/2016  . Type 2 diabetes mellitus with complication, without long-term current use of insulin ([place]) 11/07/2009  . Mixed hyperlipidemia 11/07/2009  . GOUT, UNSPECIFIED 11/07/2009  . Essential hypertension 11/07/2009  . UNSTABLE ANGINA 11/07/2009  . 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 GERD 11/07/2009  . Backache 11/07/2009   Social History   Tobacco Use  . Smoking status:  Never Smoker  . Smokeless tobacco: Current User    Types: Chew  . Tobacco comment: chew tobacco - 1 pack last 3 weeks  Vaping Use  . Vaping Use: Never used  Substance Use Topics  . Alcohol use: No    Alcohol/week: 0.0 standard drinks  . Drug use: No   Allergies  Allergen Reactions  . Ramipril        Medications: Outpatient Medications Prior to Visit  Medication Sig  . ACCU-CHEK AVIVA PLUS test strip USE AS DIRECTED TO CHECK FASTING BLOOD SUGAR EVERY MORNING  . allopurinol (ZYLOPRIM) 300 MG tablet TAKE 1 TABLET BY MOUTH ONCE DAILY  . aspirin 81 MG chewable tablet Chew 81 mg by mouth daily.  Marland Kitchen atorvastatin (LIPITOR) 20 MG tablet TAKE 1 TABLET BY MOUTH ONCE DAILY  . BD SHARPS CONTAINER HOME MISC Dispose of syringes after B12 injections  . Blood Glucose Monitoring Suppl (ACCU-CHEK AVIVA PLUS) w/Device KIT To check blood sugar once daily  . carvedilol (COREG) 6.25 MG tablet Take 1 tablet (6.25 mg total) by mouth 2 (two) times daily.  . dapagliflozin propanediol (FARXIGA) 10 MG TABS tablet Take 1 tablet (10 mg total) by mouth daily before breakfast.  . digoxin (LANOXIN) 0.125 MG tablet TAKE 1 TABLET BY MOUTH ONCE DAILY  . furosemide (LASIX) 20 MG tablet Take 1 tablet twice daily 3 days per week and 1 tablet once daily on the other days.  Marland Kitchen gabapentin (NEURONTIN) 400 MG capsule Take 1 capsule (400 mg total) by mouth 3 (three) times daily.  Marland Kitchen glipiZIDE (GLUCOTROL XL) 10 MG 24 hr tablet TAKE 1 TABLET BY MOUTH ONCE DAILY WITH BREAKFAST  . isosorbide mononitrate (IMDUR) 30 MG 24 hr tablet Take 1 tablet (30 mg total) by mouth daily.  Marland Kitchen lisinopril (ZESTRIL) 20 MG tablet TAKE 1 TABLET BY MOUTH ONCE DAILY  . meloxicam (MOBIC) 7.5 MG tablet TAKE 1 TABLET BY MOUTH TWICE DAILY  . metFORMIN (GLUCOPHAGE) 1000 MG tablet TAKE 1 TABLET BY MOUTH TWICE (2) DAILY WITH A MEAL  . nitroGLYCERIN (NITROSTAT) 0.4 MG SL tablet Place 1 tablet (0.4 mg total) under the tongue every 5 (five) minutes as needed for  chest pain.  . potassium chloride (KLOR-CON) 10 MEQ tablet TAKE 1 TABLET BY MOUTH ONCE DAILY  . sitaGLIPtin (JANUVIA) 50 MG tablet Take 1 tablet (50 mg total) by mouth daily.  . SYRINGE-NEEDLE, DISP, 3 ML (B-D SYRINGE/NEEDLE 3CC/25GX5/8) 25G X 5/8" 3 ML MISC For use with cyanocobalamin injections  . tamsulosin (FLOMAX) 0.4 MG CAPS capsule Take 1 capsule (0.4 mg total) by mouth daily.   No facility-administered medications prior to visit.    Review of Systems  Constitutional: Negative for activity change, appetite change and fatigue.  Respiratory: Negative for cough, chest tightness and shortness of breath.   Cardiovascular: Positive for leg swelling. Negative for chest pain and palpitations.    Last CBC Lab Results  Component Value Date   WBC 7.4 04/23/2020  HGB 11.5 (L) 04/23/2020   HCT 35.0 (L) 04/23/2020   MCV 90 04/23/2020   MCH 29.6 04/23/2020   RDW 12.8 04/23/2020   PLT 224 04/23/2020   Last thyroid functions Lab Results  Component Value Date   TSH 3.840 04/21/2018   Last vitamin B12 and Folate Lab Results  Component Value Date   VITAMINB12 535 01/19/2018   FOLATE 17.4 09/14/2017        Objective    BP (!) 154/56 (BP Location: Right Arm, Patient Position: Sitting, Cuff Size: Normal)   Pulse (!) 53   Temp 98.4 F (36.9 C) (Oral)   Resp 16   Wt 172 lb (78 kg)   SpO2 100%   BMI 26.15 kg/m  BP Readings from Last 3 Encounters:  10/22/20 (!) 154/56  07/25/20 (!) 136/57  05/16/20 138/84   Wt Readings from Last 3 Encounters:  10/22/20 172 lb (78 kg)  07/25/20 167 lb (75.8 kg)  05/16/20 173 lb (78.5 kg)      Physical Exam Vitals reviewed.  Constitutional:      General: He is not in acute distress.    Appearance: Normal appearance. He is well-developed. He is not ill-appearing or diaphoretic.  HENT:     Head: Normocephalic and atraumatic.  Cardiovascular:     Rate and Rhythm: Normal rate and regular rhythm.     Heart sounds: Normal heart sounds. No  murmur heard. No friction rub. No gallop.   Pulmonary:     Effort: Pulmonary effort is normal. No respiratory distress.     Breath sounds: Normal breath sounds. No wheezing or rales.  Musculoskeletal:     Cervical back: Normal range of motion and neck supple.  Neurological:     Mental Status: He is alert.     Results for orders placed or performed in visit on 10/22/20  POCT glycosylated hemoglobin (Hb A1C)  Result Value Ref Range   Hemoglobin A1C 7.3 (A) 4.0 - 5.6 %   Est. average glucose Bld gHb Est-mCnc 163     Assessment & Plan     1. Type 2 diabetes mellitus with stage 2 chronic kidney disease, without long-term current use of insulin (HCC) A1c improved from 7.4 to 7.3. Continue Farxiga 65m, glipizide XR 180m metformin 100010mID, Januvia 47m55mily. On ACE I and statin. F/U in 3 months with Dr. FishCaryn Section next A1c check. - POCT glycosylated hemoglobin (Hb A1C)  2. Essential hypertension Stable. Continie Carvedilol 6.25mg29m, Imdur 30mg,27minopril 20mg. 47m CKD (chronic kidney disease), stage II Stable.   4. Neuropathy Stable on gabapentin 400mg TI20m  No follow-ups on file.      I, JenniReynolds Bowlhave reviewed all documentation for this visit. The documentation on 10/22/20 for the exam, diagnosis, procedures, and orders are all accurate and complete.   JenniferRubye BeachgtKpc [phone]

## 2020-10-23 NOTE — Progress Notes (Signed)
This might have been meant for you

## 2020-12-03 ENCOUNTER — Other Ambulatory Visit: Payer: Self-pay | Admitting: Physician Assistant

## 2020-12-03 DIAGNOSIS — M2550 Pain in unspecified joint: Secondary | ICD-10-CM

## 2020-12-06 ENCOUNTER — Other Ambulatory Visit: Payer: Self-pay | Admitting: Physician Assistant

## 2020-12-06 DIAGNOSIS — R600 Localized edema: Secondary | ICD-10-CM

## 2021-01-30 ENCOUNTER — Encounter: Payer: Self-pay | Admitting: Family Medicine

## 2021-01-30 ENCOUNTER — Other Ambulatory Visit: Payer: Self-pay

## 2021-01-30 ENCOUNTER — Ambulatory Visit (INDEPENDENT_AMBULATORY_CARE_PROVIDER_SITE_OTHER): Payer: Medicare Other | Admitting: Family Medicine

## 2021-01-30 VITALS — BP 134/58 | HR 56 | Wt 173.0 lb

## 2021-01-30 DIAGNOSIS — E782 Mixed hyperlipidemia: Secondary | ICD-10-CM

## 2021-01-30 DIAGNOSIS — E118 Type 2 diabetes mellitus with unspecified complications: Secondary | ICD-10-CM | POA: Diagnosis not present

## 2021-01-30 DIAGNOSIS — I25118 Atherosclerotic heart disease of native coronary artery with other forms of angina pectoris: Secondary | ICD-10-CM

## 2021-01-30 DIAGNOSIS — I10 Essential (primary) hypertension: Secondary | ICD-10-CM | POA: Diagnosis not present

## 2021-01-30 LAB — POCT GLYCOSYLATED HEMOGLOBIN (HGB A1C): Hemoglobin A1C: 7.7 % — AB (ref 4.0–5.6)

## 2021-01-30 NOTE — Progress Notes (Signed)
Established patient visit   Patient: [name] Eber Sr.   DOB: [birth year]   80 y.o. Male  MRN: [id number] Visit Date: 01/30/2021  Today's healthcare provider: Lelon Huh, MD   Chief Complaint  Patient presents with   Diabetes   Hyperlipidemia   Hypertension   Subjective    HPI  Diabetes Mellitus Type II, follow-up  Lab Results  Component Value Date   HGBA1C 7.3 (A) 10/22/2020   HGBA1C 7.4 (A) 07/25/2020   HGBA1C 11.3 (H) 04/23/2020   Last seen for diabetes 3 months ago.  Management since then includes continuing the same treatment. He reports excellent compliance with treatment. He is not having side effects.   Home blood sugar records: fasting range: 90-mid 100's  Episodes of hypoglycemia? No    Current insulin regiment: none Most Recent Eye Exam: pt is due for an eye exam   --------------------------------------------------------------------------------------------------- Hypertension, follow-up  BP Readings from Last 3 Encounters:  01/30/21 (!) 150/59  10/22/20 (!) 154/56  07/25/20 (!) 136/57   Wt Readings from Last 3 Encounters:  01/30/21 173 lb (78.5 kg)  10/22/20 172 lb (78 kg)  07/25/20 167 lb (75.8 kg)     He was last seen for hypertension 3 months ago.  BP at that visit was 154/56. Management since that visit includes no changes. He reports excellent compliance with treatment. He is not having side effects.  He is exercising. He is not adherent to low salt diet.   Outside blood pressures are .  He does not smoke.  Use of agents associated with hypertension: none.   --------------------------------------------------------------------------------------------------- Lipid/Cholesterol, follow-up  Last Lipid Panel: Lab Results  Component Value Date   CHOL 115 04/23/2020   LDLCALC 57 04/23/2020   HDL 32 (L) 04/23/2020   TRIG 148 04/23/2020    He was last seen for this 10 months ago.  Management since that visit  includes no changes.  He reports excellent compliance with treatment. He is not having side effects.   Symptoms: No appetite changes No foot ulcerations  No chest pain No chest pressure/discomfort  No dyspnea No orthopnea  No fatigue No lower extremity edema  No palpitations No paroxysmal nocturnal dyspnea  No nausea No numbness or tingling of extremity  No polydipsia No polyuria  No speech difficulty No syncope   He is following a Regular diet.   Last metabolic panel Lab Results  Component Value Date   GLUCOSE 281 (H) 04/23/2020   NA 135 04/23/2020   K 4.6 04/23/2020   BUN 17 04/23/2020   CREATININE 1.21 04/23/2020   GFRNONAA 57 (L) 04/23/2020   GFRAA 66 04/23/2020   CALCIUM 9.5 04/23/2020   AST 9 04/23/2020   ALT 10 04/23/2020   The ASCVD Risk score (Goff DC Jr., et al., 2013) failed to calculate for the following reasons:   The valid total cholesterol range is 130 to 320 mg/dL  ---------------------------------------------------------------------------------------------------      Medications: Outpatient Medications Prior to Visit  Medication Sig   ACCU-CHEK AVIVA PLUS test strip USE AS DIRECTED TO CHECK FASTING BLOOD SUGAR EVERY MORNING   allopurinol (ZYLOPRIM) 300 MG tablet TAKE 1 TABLET BY MOUTH ONCE DAILY   aspirin 81 MG chewable tablet Chew 81 mg by mouth daily.   atorvastatin (LIPITOR) 20 MG tablet TAKE 1 TABLET BY MOUTH ONCE DAILY   BD SHARPS CONTAINER HOME MISC Dispose of syringes after B12 injections   Blood Glucose Monitoring Suppl (ACCU-CHEK AVIVA PLUS) w/Device KIT  To check blood sugar once daily   carvedilol (COREG) 6.25 MG tablet Take 1 tablet (6.25 mg total) by mouth 2 (two) times daily.   dapagliflozin propanediol (FARXIGA) 10 MG TABS tablet Take 1 tablet (10 mg total) by mouth daily before breakfast.   digoxin (LANOXIN) 0.125 MG tablet TAKE 1 TABLET BY MOUTH ONCE DAILY   furosemide (LASIX) 20 MG tablet Take 1 tablet twice daily 3 days per week  and 1 tablet once daily on the other days.   gabapentin (NEURONTIN) 400 MG capsule Take 1 capsule (400 mg total) by mouth 3 (three) times daily.   glipiZIDE (GLUCOTROL XL) 10 MG 24 hr tablet TAKE 1 TABLET BY MOUTH ONCE DAILY WITH BREAKFAST   isosorbide mononitrate (IMDUR) 30 MG 24 hr tablet Take 1 tablet (30 mg total) by mouth daily.   lisinopril (ZESTRIL) 20 MG tablet TAKE 1 TABLET BY MOUTH ONCE DAILY   meloxicam (MOBIC) 7.5 MG tablet TAKE 1 TABLET BY MOUTH TWICE DAILY   metFORMIN (GLUCOPHAGE) 1000 MG tablet TAKE 1 TABLET BY MOUTH TWICE (2) DAILY WITH A MEAL   nitroGLYCERIN (NITROSTAT) 0.4 MG SL tablet Place 1 tablet (0.4 mg total) under the tongue every 5 (five) minutes as needed for chest pain.   potassium chloride (KLOR-CON) 10 MEQ tablet TAKE 1 TABLET BY MOUTH ONCE DAILY   sitaGLIPtin (JANUVIA) 50 MG tablet Take 1 tablet (50 mg total) by mouth daily.   SYRINGE-NEEDLE, DISP, 3 ML (B-D SYRINGE/NEEDLE 3CC/25GX5/8) 25G X 5/8" 3 ML MISC For use with cyanocobalamin injections   tamsulosin (FLOMAX) 0.4 MG CAPS capsule Take 1 capsule (0.4 mg total) by mouth daily.   No facility-administered medications prior to visit.    Review of Systems  Constitutional: Negative.   Respiratory: Negative.    Cardiovascular: Negative.   Gastrointestinal: Negative.   Endocrine: Negative for cold intolerance.  Skin:  Negative for wound.  Neurological:  Positive for light-headedness. Negative for dizziness and headaches.      Objective    BP (!) 134/58   Pulse (!) 56   Wt 173 lb (78.5 kg)   SpO2 99%   BMI 26.30 kg/m     Physical Exam   General: Appearance:     Well developed, well nourished male in no acute distress  Eyes:    PERRL, conjunctiva/corneas clear, EOM's intact       Lungs:     Clear to auscultation bilaterally, respirations unlabored  Heart:    Bradycardic. Normal rhythm. No murmurs, rubs, or gallops.   MS:   All extremities are intact.    Neurologic:   Awake, alert, oriented x 3.  No apparent focal neurological           defect.        Results for orders placed or performed in visit on 01/30/21  POCT glycosylated hemoglobin (Hb A1C)  Result Value Ref Range   Hemoglobin A1C 7.7 (A) 4.0 - 5.6 %    Assessment & Plan     1. Essential hypertension Fairly well controlled. Continue current medications.    2. Type 2 diabetes mellitus with complication, without long-term current use of insulin (HCC) A1c up today, he admits to consuming a lot of tomato sandwiches on white bread. Continue current medications.  Is to call to schedule follow up in November.   3. Mixed hyperlipidemia He is tolerating atorvastatin well with no adverse effects.    4. Coronary artery disease of native artery of native heart with stable angina  pectoris ([place]) Asymptomatic. Compliant with medication.  Continue aggressive risk factor modification.  Continue routine follow up with Dr. Rockey Situ   Return in about 4 months (around 06/02/2021).     The entirety of the information documented in the History of Present Illness, Review of Systems and Physical Exam were personally obtained by me. Portions of this information were initially documented by the CMA and reviewed by me for thoroughness and accuracy.     Lelon Huh, MD  [facility] [phone] (phone) [phone] (fax)  [place]

## 2021-02-18 ENCOUNTER — Other Ambulatory Visit: Payer: Self-pay | Admitting: Physician Assistant

## 2021-02-18 DIAGNOSIS — M1A9XX0 Chronic gout, unspecified, without tophus (tophi): Secondary | ICD-10-CM

## 2021-03-14 ENCOUNTER — Other Ambulatory Visit: Payer: Self-pay | Admitting: Cardiovascular Disease

## 2021-03-14 ENCOUNTER — Other Ambulatory Visit: Payer: Self-pay | Admitting: Physician Assistant

## 2021-04-04 ENCOUNTER — Other Ambulatory Visit: Payer: Self-pay | Admitting: Cardiovascular Disease

## 2021-04-04 ENCOUNTER — Other Ambulatory Visit: Payer: Self-pay | Admitting: Physician Assistant

## 2021-04-04 DIAGNOSIS — I10 Essential (primary) hypertension: Secondary | ICD-10-CM

## 2021-04-04 DIAGNOSIS — I482 Chronic atrial fibrillation, unspecified: Secondary | ICD-10-CM

## 2021-04-19 ENCOUNTER — Other Ambulatory Visit: Payer: Self-pay | Admitting: Physician Assistant

## 2021-04-19 DIAGNOSIS — I10 Essential (primary) hypertension: Secondary | ICD-10-CM

## 2021-05-10 ENCOUNTER — Other Ambulatory Visit: Payer: Self-pay | Admitting: Physician Assistant

## 2021-05-10 DIAGNOSIS — N401 Enlarged prostate with lower urinary tract symptoms: Secondary | ICD-10-CM

## 2021-05-10 DIAGNOSIS — R3911 Hesitancy of micturition: Secondary | ICD-10-CM

## 2021-05-16 ENCOUNTER — Other Ambulatory Visit: Payer: Self-pay

## 2021-05-16 ENCOUNTER — Encounter: Payer: Self-pay | Admitting: Medical

## 2021-05-16 ENCOUNTER — Ambulatory Visit: Payer: Medicare Other | Admitting: Medical

## 2021-05-16 VITALS — BP 138/70 | HR 62 | Ht 70.0 in | Wt 172.2 lb

## 2021-05-16 DIAGNOSIS — E118 Type 2 diabetes mellitus with unspecified complications: Secondary | ICD-10-CM

## 2021-05-16 DIAGNOSIS — I25118 Atherosclerotic heart disease of native coronary artery with other forms of angina pectoris: Secondary | ICD-10-CM | POA: Diagnosis not present

## 2021-05-16 DIAGNOSIS — I5033 Acute on chronic diastolic (congestive) heart failure: Secondary | ICD-10-CM

## 2021-05-16 DIAGNOSIS — R0609 Other forms of dyspnea: Secondary | ICD-10-CM

## 2021-05-16 DIAGNOSIS — I10 Essential (primary) hypertension: Secondary | ICD-10-CM

## 2021-05-16 DIAGNOSIS — N182 Chronic kidney disease, stage 2 (mild): Secondary | ICD-10-CM

## 2021-05-16 DIAGNOSIS — I208 Other forms of angina pectoris: Secondary | ICD-10-CM

## 2021-05-16 DIAGNOSIS — E785 Hyperlipidemia, unspecified: Secondary | ICD-10-CM

## 2021-05-16 MED ORDER — ISOSORBIDE MONONITRATE ER 30 MG PO TB24: 90.0000 mg | ORAL_TABLET | Freq: Every day | ORAL | 0 refills | Status: DC

## 2021-05-16 NOTE — Patient Instructions (Addendum)
Medication Instructions:  Your physician has recommended you make the following change in your medication:   INCREASE Isosorbide mononitrate (Imdur) 30 mg and take 3 tablets (90 mg) once daily   *If you need a refill on your cardiac medications before your next appointment, please call your pharmacy*   Lab Work: CMET, CBC, and lipid panel today.  If you have labs (blood work) drawn today and your tests are completely normal, you will receive your results only by: MyChart Message (if you have MyChart) OR A paper copy in the mail If you have any lab test that is abnormal or we need to change your treatment, we will call you to review the results.   Testing/Procedures: None   Follow-Up: At [facility], you and your health needs are our priority.  As part of our continuing mission to provide you with exceptional heart care, we have created designated Provider Care Teams.  These Care Teams include your primary Cardiologist (physician) and Advanced Practice Providers (APPs -  Physician Assistants and Nurse Practitioners) who all work together to provide you with the care you need, when you need it.  We recommend signing up for the patient portal called "MyChart".  Sign up information is provided on this After Visit Summary.  MyChart is used to connect with patients for Virtual Visits (Telemedicine).  Patients are able to view lab/test results, encounter notes, upcoming appointments, etc.  Non-urgent messages can be sent to your provider as well.   To learn more about what you can do with MyChart, go to [email or web address].    Your next appointment:   3 week(s)  The format for your next appointment:   In Person  Provider:   You may see Julien Nordmann, MD or one of the following Advanced Practice Providers on your designated Care Team:   Nicolasa Ducking, NP Eula Listen, [name] Marisue Ivan, [name] Cadence Maxatawny, [place]

## 2021-05-16 NOTE — Progress Notes (Signed)
Cardiology Office Note:    Date:  05/16/2021   ID:  [name] Gross Sr., DOB 04/19/1941, MRN [id number]  PCP:  Mar Daring, [name]  [facility] Cardiologist:  [name], MD  [place] Electrophysiologist:  None   Referring MD: Florian Buff*   Chief Complaint: 12 month follow-up  History of Present Illness:    [name] [place] Sr. is a 80 y.o. male with a hx of CAD s/p CABG (2004), HFpEF, hypertension, hyperlipidemia, CKD 3, DM 2 who presents for 12 month follow-up.   H/o CAD s/p CABG x4 in 2004 with subsequent catheterization in 2007 revealing 3 and 4 patent grafts (occluded vein graft to the diagonal). Recent medication changes include addition of Cialis and discontinuation of Imdur (given interaction between these two mediations). Coreg has also been discontinued with patient previously on Coreg 6.67m BID but no longer taking for unclear reasons at this time.   Myoview 07/2019 was abnormal, high risk test with possible ischemia, LVEF 43%.  Echo 07/2019 showed LVEF 50-55%, G2DD, WMA, mildly dilated LA, moderately elevated pulmonary artery pressure.   Last seen 04/2020 and was doing well from a cardiac standpoint.   Today, the patient reports he has been having chest pain. Started for 3-4 months. Worse with exertion. It's a sharp pain. He has to rest a while before the pain resolves. Pain lasts 10 minutes. Haven't taken SL NTG. Has shortness of breath with the pain. No nausea, vomiting, diaphoresis. Takes fluid pill for lower leg swelling. Sometimes takes an extra at night. No orthopnea or pnd. He does work outside a lot, dosen't seem to have chest pain with this. [facility] will request. Most recent a1c 7.7.  Past Medical History:  Diagnosis Date   Arthritis    Back pain    hx   CAD (coronary artery disease)    a. 2004 s/p CABG x 4 (LIMA->LAD, VG->Diag, VG->OM, VG->RCA); b. 2007 Cath:  VG->Diag occluded, otw 3/4 patent grafts.   CKD (chronic kidney disease), stage II-III    Colon polyps    DM2 (diabetes mellitus, type 2) (HCC)    stable   GERD (gastroesophageal reflux disease)    Glaucoma    Gout    HLD (hyperlipidemia)    HTN (hypertension)    Neuropathy     Past Surgical History:  Procedure Laterality Date   BACK SURGERY     Cyst removed   CORONARY ARTERY BYPASS GRAFT  2004   KNEE SURGERY      Current Medications: Current Meds  Medication Sig   ACCU-CHEK AVIVA PLUS test strip USE AS DIRECTED TO CHECK FASTING BLOOD SUGAR EVERY MORNING   allopurinol (ZYLOPRIM) 300 MG tablet TAKE 1 TABLET BY MOUTH ONCE DAILY   aspirin 81 MG chewable tablet Chew 81 mg by mouth daily.   atorvastatin (LIPITOR) 20 MG tablet TAKE 1 TABLET BY MOUTH ONCE DAILY   BD SHARPS CONTAINER HOME MISC Dispose of syringes after B12 injections   Blood Glucose Monitoring Suppl (ACCU-CHEK AVIVA PLUS) w/Device KIT To check blood sugar once daily   carvedilol (COREG) 6.25 MG tablet TAKE 1 TABLET BY MOUTH TWICE A DAY   dapagliflozin propanediol (FARXIGA) 10 MG TABS tablet Take 1 tablet (10 mg total) by mouth daily before breakfast.   digoxin (LANOXIN) 0.125 MG tablet TAKE 1 TABLET BY MOUTH ONCE DAILY   furosemide (LASIX) 20 MG tablet Take 1 tablet twice daily 3 days  per week and 1 tablet once daily on the other days.   gabapentin (NEURONTIN) 400 MG capsule Take 1 capsule (400 mg total) by mouth 3 (three) times daily.   glipiZIDE (GLUCOTROL XL) 10 MG 24 hr tablet TAKE 1 TABLET BY MOUTH ONCE DAILY WITH BREAKFAST   JANUVIA 50 MG tablet TAKE 1 TABLET BY MOUTH ONCE A DAY   lisinopril (ZESTRIL) 20 MG tablet TAKE 1 TABLET BY MOUTH ONCE DAILY   meloxicam (MOBIC) 7.5 MG tablet TAKE 1 TABLET BY MOUTH TWICE DAILY   metFORMIN (GLUCOPHAGE) 1000 MG tablet TAKE 1 TABLET BY MOUTH TWICE (2) DAILY WITH A MEAL   nitroGLYCERIN (NITROSTAT) 0.4 MG SL tablet Place 1 tablet (0.4 mg total) under the tongue every 5 (five)  minutes as needed for chest pain.   potassium chloride (KLOR-CON) 10 MEQ tablet TAKE 1 TABLET BY MOUTH ONCE DAILY   SYRINGE-NEEDLE, DISP, 3 ML (B-D SYRINGE/NEEDLE 3CC/25GX5/8) 25G X 5/8" 3 ML MISC For use with cyanocobalamin injections   tamsulosin (FLOMAX) 0.4 MG CAPS capsule Take 1 capsule (0.4 mg total) by mouth daily. Please schedule an office visit before anymore refills.   [DISCONTINUED] isosorbide mononitrate (IMDUR) 30 MG 24 hr tablet Take 1 tablet (30 mg total) by mouth 2 (two) times daily. PLEASE CALL TO SCHEDULE OFFICE VISIT FOR FURTHER REFILLS. THANK YOU!     Allergies:   Ramipril   Social History   Socioeconomic History   Marital status: Widowed    Spouse name: Not on file   Number of children: 3   Years of education: Not on file   Highest education level: 8th grade  Occupational History   Not on file  Tobacco Use   Smoking status: Never   Smokeless tobacco: Current    Types: Chew   Tobacco comments:    chew tobacco - 1 pack last 3 weeks  Vaping Use   Vaping Use: Never used  Substance and Sexual Activity   Alcohol use: No    Alcohol/week: 0.0 standard drinks   Drug use: No   Sexual activity: Not Currently  Other Topics Concern   Not on file  Social History Narrative   Not on file   Social Determinants of Health   Financial Resource Strain: Not on file  Food Insecurity: Not on file  Transportation Needs: Not on file  Physical Activity: Not on file  Stress: Not on file  Social Connections: Not on file     Family History: The patient's Family history is unknown by patient.  ROS:   Please see the history of present illness.     All other systems reviewed and are negative.  EKGs/Labs/Other Studies Reviewed:    The following studies were reviewed today:  Echo 07/2019  1. Left ventricular ejection fraction, by visual estimation, is 50 to  55%. The left ventricle has normal function. There is no left ventricular  hypertrophy.   2. Left ventricular  diastolic parameters are consistent with Grade II  diastolic dysfunction (pseudonormalization).   3. Mildly dilated left ventricular internal cavity size.   4. The left ventricle demonstrates regional wall motion  abnormalities.Hypokinesis of the basal septal, anteroseptal,  inferior/inferoseptal regions   5. Global right ventricle has normal systolic function.The right  ventricular size is normal. No increase in right ventricular wall  thickness.   6. Left atrial size was mildly dilated.   7. Moderately elevated pulmonary artery systolic pressure.   8. The tricuspid regurgitant velocity is 2.94 m/s, and with an  assumed  right atrial pressure of 10 mmHg, the estimated right ventricular systolic  pressure is moderately elevated at 44.7 mmHg.   9. Challenging images   EKG:  EKG is  ordered today.  The ekg ordered today demonstrates NSR, 62bpm, RBBB, no changes  Recent Labs: No results found for requested labs within last 8760 hours.  Recent Lipid Panel    Component Value Date/Time   CHOL 115 04/23/2020 0917   TRIG 148 04/23/2020 0917   TRIG 136 02/28/2008 0000   HDL 32 (L) 04/23/2020 0917   CHOLHDL 3.6 04/23/2020 0917   LDLCALC 57 04/23/2020 0917      Physical Exam:    VS:  BP 138/70 (BP Location: Left Arm, Patient Position: Sitting, Cuff Size: Normal)   Pulse 62   Ht 5' 10" (1.778 m)   Wt 172 lb 4 oz (78.1 kg)   SpO2 (!) 9%   BMI 24.72 kg/m     Wt Readings from Last 3 Encounters:  05/16/21 172 lb 4 oz (78.1 kg)  01/30/21 173 lb (78.5 kg)  10/22/20 172 lb (78 kg)     GEN:  Well nourished, well developed in no acute distress HEENT: Normal NECK: No JVD; No carotid bruits LYMPHATICS: No lymphadenopathy CARDIAC: RRR, no murmurs, rubs, gallops RESPIRATORY:  Clear to auscultation without rales, wheezing or rhonchi  ABDOMEN: Soft, non-tender, non-distended MUSCULOSKELETAL:  No edema; No deformity  SKIN: Warm and dry NEUROLOGIC:  Alert and oriented x 3 PSYCHIATRIC:   Normal affect   ASSESSMENT:    1. Coronary artery disease of native artery of native heart with stable angina pectoris ([place])   2. Essential hypertension   3. CKD (chronic kidney disease), stage II   4. Dyspnea on exertion   5. Acute on chronic heart failure with preserved ejection fraction ([place])   6. Stable angina (HCC)   7. Type 2 diabetes mellitus with complication, without long-term current use of insulin ([place])   8. Hyperlipidemia LDL goal <70    PLAN:    In order of problems listed above:  Unstable Angina CAD s/p CABG Patient reports exeritonal chest pain and shortness of beath fo the last 4 months. Seems has had similar symptoms in the past. MPI from 2021 showed areas of ischemia. Converstaion post-MPI patient wanted to puruse medical management over cath, this was partly because he was very active and didn't have anginal symptoms with this. EKG today with no new ischemic changes. Again cath vs medicaiton discussed with the pateint. He would prefer medication changes at thist ime. I will increase Imdur to 90 mg daily. Can consider Ranexa as well. Concern he will eventually need repeat cath. I will get general labs today, CMET, lipid, CBC. Continue ASA, statin, BB, ACEi. He has SL NTG at home.   HTN BP mildly elevated. Increase Imdur as above. Continue Coreg and lisinopril  HLD Lipid panel today. Continue Lipitor  DM2 Most recent a1c 7.7. followed by PCP.   HFpEF Echo 2021 showed LVEF 50-55%, G2DD, WMA, moderately elevated pulmonary artery pressure. Euvolemic on exam today. He takes lasix 49m daily, and an extra pill as needed at night. BMET today. Continue Coreg, lisinopril.   Disposition: Follow up in 3 week(s) with MD/APP    Signed, Cadence HNinfa Meeker [name]  05/16/2021 9:40 AM    [facility]

## 2021-05-17 ENCOUNTER — Telehealth: Payer: Self-pay

## 2021-05-17 LAB — COMPREHENSIVE METABOLIC PANEL
ALT: 19 IU/L (ref 0–44)
AST: 16 IU/L (ref 0–40)
Albumin/Globulin Ratio: 2.7 — ABNORMAL HIGH (ref 1.2–2.2)
Albumin: 4.8 g/dL — ABNORMAL HIGH (ref 3.7–4.7)
Alkaline Phosphatase: 57 IU/L (ref 44–121)
BUN/Creatinine Ratio: 11 (ref 10–24)
BUN: 15 mg/dL (ref 8–27)
Bilirubin Total: 0.3 mg/dL (ref 0.0–1.2)
CO2: 21 mmol/L (ref 20–29)
Calcium: 9.4 mg/dL (ref 8.6–10.2)
Chloride: 104 mmol/L (ref 96–106)
Creatinine, Ser: 1.37 mg/dL — ABNORMAL HIGH (ref 0.76–1.27)
Globulin, Total: 1.8 g/dL (ref 1.5–4.5)
Glucose: 109 mg/dL — ABNORMAL HIGH (ref 70–99)
Potassium: 4.8 mmol/L (ref 3.5–5.2)
Sodium: 141 mmol/L (ref 134–144)
Total Protein: 6.6 g/dL (ref 6.0–8.5)
eGFR: 52 mL/min/{1.73_m2} — ABNORMAL LOW (ref >59)

## 2021-05-17 LAB — LIPID PANEL
Chol/HDL Ratio: 3.3 ratio (ref 0.0–5.0)
Cholesterol, Total: 114 mg/dL (ref 100–199)
HDL: 35 mg/dL — ABNORMAL LOW (ref >39)
LDL Chol Calc (NIH): 59 mg/dL (ref 0–99)
Triglycerides: 104 mg/dL (ref 0–149)
VLDL Cholesterol Cal: 20 mg/dL (ref 5–40)

## 2021-05-17 LAB — CBC
Hematocrit: 35.5 % — ABNORMAL LOW (ref 37.5–51.0)
Hemoglobin: 11.4 g/dL — ABNORMAL LOW (ref 13.0–17.7)
MCH: 26.6 pg (ref 26.6–33.0)
MCHC: 32.1 g/dL (ref 31.5–35.7)
MCV: 83 fL (ref 79–97)
Platelets: 238 10*3/uL (ref 150–450)
RBC: 4.28 x10E6/uL (ref 4.14–5.80)
RDW: 14.3 % (ref 11.6–15.4)
WBC: 7.1 10*3/uL (ref 3.4–10.8)

## 2021-05-17 NOTE — Telephone Encounter (Signed)
Left detail message on VM of pt's recent results okay by DPR, Dr. Mariah Milling advised   "Kidney function mildly elevated. We can re-check labs at follow-up . Otherwise labs OK "  At this time, no further recommendations or medications changes, advised to call office for any concerns or questions, otherwise will see at next visit.   Pt's lab results mailed to pt, pt is not utilizing his MyChart account to see results

## 2021-06-05 NOTE — Progress Notes (Signed)
Cardiology Office Note:    Date:  06/07/2021   ID:  [name] Gross Sr., DOB November 06, 1940, MRN [id number]  PCP:  Mar Daring, [name]  [facility] Cardiologist:  [name], MD  [place] Electrophysiologist:  None   Referring MD: Florian Buff*   Chief Complaint: 3 week follow-up  History of Present Illness:    [name] Lower Marcoux Sr. is a 80 y.o. male with a hx of CAD s/p CABG (2004), HFpEF, hypertension, hyperlipidemia, CKD 3, DM 2 who presents for 12 month follow-up.    H/o CAD s/p CABG x4 in 2004 with subsequent catheterization in 2007 revealing 3 and 4 patent grafts (occluded vein graft to the diagonal). Recent medication changes include addition of Cialis and discontinuation of Imdur (given interaction between these two mediations). Coreg has also been discontinued with patient previously on Coreg 6.45m BID but no longer taking for unclear reasons at this time.    Myoview 07/2019 was abnormal, high risk test with possible ischemia, LVEF 43%.  Echo 07/2019 showed LVEF 50-55%, G2DD, WMA, mildly dilated LA, moderately elevated pulmonary artery pressure.     Last seen 05/16/21 and reported chest pain. Imdur was increased to 90 mg daily.   Today, the patient reports improved chest pain. Was having consistent chest pain with walking 1/2 mild, now he is not experiencing this. Has some dyspnea of exertion. BP high, but has not had medications this AM. HE does not remember BP at home    Past Medical History:  Diagnosis Date   Arthritis    Back pain    hx   CAD (coronary artery disease)    a. 2004 s/p CABG x 4 (LIMA->LAD, VG->Diag, VG->OM, VG->RCA); b. 2007 Cath: VG->Diag occluded, otw 3/4 patent grafts.   CKD (chronic kidney disease), stage II-III    Colon polyps    DM2 (diabetes mellitus, type 2) (HCC)    stable   GERD (gastroesophageal reflux disease)    Glaucoma    Gout    HLD (hyperlipidemia)    HTN (hypertension)    Neuropathy      Past Surgical History:  Procedure Laterality Date   BACK SURGERY     Cyst removed   CORONARY ARTERY BYPASS GRAFT  2004   KNEE SURGERY      Current Medications: Current Meds  Medication Sig   ACCU-CHEK AVIVA PLUS test strip USE AS DIRECTED TO CHECK FASTING BLOOD SUGAR EVERY MORNING   allopurinol (ZYLOPRIM) 300 MG tablet TAKE 1 TABLET BY MOUTH ONCE DAILY   aspirin 81 MG chewable tablet Chew 81 mg by mouth daily.   atorvastatin (LIPITOR) 20 MG tablet TAKE 1 TABLET BY MOUTH ONCE DAILY   BD SHARPS CONTAINER HOME MISC Dispose of syringes after B12 injections   Blood Glucose Monitoring Suppl (ACCU-CHEK AVIVA PLUS) w/Device KIT To check blood sugar once daily   carvedilol (COREG) 6.25 MG tablet TAKE 1 TABLET BY MOUTH TWICE A DAY   dapagliflozin propanediol (FARXIGA) 10 MG TABS tablet Take 1 tablet (10 mg total) by mouth daily before breakfast.   digoxin (LANOXIN) 0.125 MG tablet TAKE 1 TABLET BY MOUTH ONCE DAILY   furosemide (LASIX) 20 MG tablet Take 1 tablet twice daily 3 days per week and 1 tablet once daily on the other days.   gabapentin (NEURONTIN) 400 MG capsule Take 1 capsule (400 mg total) by mouth 3 (three) times daily.   glipiZIDE (GLUCOTROL XL) 10 MG 24 hr tablet TAKE 1 TABLET BY MOUTH  ONCE DAILY WITH BREAKFAST   isosorbide mononitrate (IMDUR) 30 MG 24 hr tablet Take 3 tablets (90 mg total) by mouth daily.   JANUVIA 50 MG tablet TAKE 1 TABLET BY MOUTH ONCE A DAY   lisinopril (ZESTRIL) 20 MG tablet TAKE 1 TABLET BY MOUTH ONCE DAILY   meloxicam (MOBIC) 7.5 MG tablet TAKE 1 TABLET BY MOUTH TWICE DAILY   metFORMIN (GLUCOPHAGE) 1000 MG tablet TAKE 1 TABLET BY MOUTH TWICE (2) DAILY WITH A MEAL   nitroGLYCERIN (NITROSTAT) 0.4 MG SL tablet Place 1 tablet (0.4 mg total) under the tongue every 5 (five) minutes as needed for chest pain.   potassium chloride (KLOR-CON) 10 MEQ tablet TAKE 1 TABLET BY MOUTH ONCE DAILY   SYRINGE-NEEDLE, DISP, 3 ML (B-D SYRINGE/NEEDLE 3CC/25GX5/8) 25G X 5/8" 3  ML MISC For use with cyanocobalamin injections   tamsulosin (FLOMAX) 0.4 MG CAPS capsule Take 1 capsule (0.4 mg total) by mouth daily. Please schedule an office visit before anymore refills.     Allergies:   Ramipril   Social History   Socioeconomic History   Marital status: Widowed    Spouse name: Not on file   Number of children: 3   Years of education: Not on file   Highest education level: 8th grade  Occupational History   Not on file  Tobacco Use   Smoking status: Never   Smokeless tobacco: Current    Types: Chew   Tobacco comments:    chew tobacco - 1 pack last 3 weeks  Vaping Use   Vaping Use: Never used  Substance and Sexual Activity   Alcohol use: No    Alcohol/week: 0.0 standard drinks   Drug use: No   Sexual activity: Not Currently  Other Topics Concern   Not on file  Social History Narrative   Not on file   Social Determinants of Health   Financial Resource Strain: Not on file  Food Insecurity: Not on file  Transportation Needs: Not on file  Physical Activity: Not on file  Stress: Not on file  Social Connections: Not on file     Family History: The patient's Family history is unknown by patient.  ROS:   Please see the history of present illness.     All other systems reviewed and are negative.  EKGs/Labs/Other Studies Reviewed:    The following studies were reviewed today:    Echo 07/2019  1. Left ventricular ejection fraction, by visual estimation, is 50 to  55%. The left ventricle has normal function. There is no left ventricular  hypertrophy.   2. Left ventricular diastolic parameters are consistent with Grade II  diastolic dysfunction (pseudonormalization).   3. Mildly dilated left ventricular internal cavity size.   4. The left ventricle demonstrates regional wall motion  abnormalities.Hypokinesis of the basal septal, anteroseptal,  inferior/inferoseptal regions   5. Global right ventricle has normal systolic function.The right   ventricular size is normal. No increase in right ventricular wall  thickness.   6. Left atrial size was mildly dilated.   7. Moderately elevated pulmonary artery systolic pressure.   8. The tricuspid regurgitant velocity is 2.94 m/s, and with an assumed  right atrial pressure of 10 mmHg, the estimated right ventricular systolic  pressure is moderately elevated at 44.7 mmHg.   9. Challenging images   MPI 07/2019 Narrative & Impression  Abnormal, potentially high risk pharmacologic myocardial perfusion stress test. There is a small in size, mild in severity, reversible defect involving the mid anterolateral  and apical anterior segments consistent with scar. There is a moderate in size, severe, nearly completely reversible defect involving the basal and mid inferior and inferoseptal myocardium consistent with ischemia and possibly a small area of scar. The left ventricular ejection appears mildly to moderately reduced by visual estimation (LVEF 43% by QGS, and 56% by Siemens calculation) with basal and mid inferior/inferoseptal hypokinesis/akinesis. Attenuation correction CT shows post-CABG findings, as well as coronary artery and aortic atherosclerotic calcifications.    EKG:  EKG is  ordered today.  The ekg ordered today demonstrates SB, 59bpm, PVCs, RBBB, no changes  Recent Labs: 05/16/2021: ALT 19; BUN 15; Creatinine, Ser 1.37; Hemoglobin 11.4; Platelets 238; Potassium 4.8; Sodium 141  Recent Lipid Panel    Component Value Date/Time   CHOL 114 05/16/2021 0923   TRIG 104 05/16/2021 0923   TRIG 136 02/28/2008 0000   HDL 35 (L) 05/16/2021 0923   CHOLHDL 3.3 05/16/2021 0923   LDLCALC 59 05/16/2021 0923     Physical Exam:    VS:  BP (!) 160/72 (BP Location: Left Arm, Patient Position: Sitting, Cuff Size: Normal)   Pulse (!) 59   Ht _0  (1.778 m)   Wt 170 lb 6 oz (77.3 kg)   SpO2 98%   BMI 24.45 kg/m     Wt Readings from Last 3 Encounters:  06/06/21 170 lb 6 oz (77.3 kg)   05/16/21 172 lb 4 oz (78.1 kg)  01/30/21 173 lb (78.5 kg)     GEN:  Well nourished, well developed in no acute distress HEENT: Normal NECK: No JVD; No carotid bruits LYMPHATICS: No lymphadenopathy CARDIAC: RRR, no murmurs, rubs, gallops RESPIRATORY:  Clear to auscultation without rales, wheezing or rhonchi  ABDOMEN: Soft, non-tender, non-distended MUSCULOSKELETAL:  No edema; No deformity  SKIN: Warm and dry NEUROLOGIC:  Alert and oriented x 3 PSYCHIATRIC:  Normal affect   ASSESSMENT:    1. Coronary artery disease of native artery of native heart with stable angina pectoris ([place])   2. Essential hypertension   3. Hyperlipidemia, mixed   4. Chronic diastolic heart failure (HCC)    PLAN:    In order of problems listed above:  CAD s/p CABG 2004 Patient reports improved symptoms with increase in Imdur. No chest pain, only has occasional DOE. No further work-up at this time. Continue ASA, statin, BB. Recommend he notify us if chest pain returns.   HTN BP high, but has not had medications. Continue Coreg 6.26mBID, Imdur 95mdaily  HLD LDL 59 04/2021. Continue Lipitor.  HFpEF Echo 2021 showed LVEF 50-55%, G2DD, WMA, moderately elevated pulmonary artery pressure. Patient is euvolemic on exam. Continue current lasix dose. Continue Coreg, Imdur, lisinopril, potassium.   Disposition: Follow up in 3 month(s) with MD/APP     Signed, Haadiya Frogge H Ninfa MeekerPA-C  06/07/2021 10:10 AM    [facility]

## 2021-06-06 ENCOUNTER — Ambulatory Visit: Payer: Medicare Other | Admitting: Medical

## 2021-06-06 ENCOUNTER — Other Ambulatory Visit: Payer: Self-pay

## 2021-06-06 ENCOUNTER — Encounter: Payer: Self-pay | Admitting: Medical

## 2021-06-06 VITALS — BP 160/72 | HR 59 | Ht 70.0 in | Wt 170.4 lb

## 2021-06-06 DIAGNOSIS — I25118 Atherosclerotic heart disease of native coronary artery with other forms of angina pectoris: Secondary | ICD-10-CM | POA: Diagnosis not present

## 2021-06-06 DIAGNOSIS — E782 Mixed hyperlipidemia: Secondary | ICD-10-CM

## 2021-06-06 DIAGNOSIS — I5032 Chronic diastolic (congestive) heart failure: Secondary | ICD-10-CM | POA: Diagnosis not present

## 2021-06-06 DIAGNOSIS — I10 Essential (primary) hypertension: Secondary | ICD-10-CM

## 2021-06-06 NOTE — Patient Instructions (Signed)
Medication Instructions:  No changes at this time.  *If you need a refill on your cardiac medications before your next appointment, please call your pharmacy*   Lab Work: None  If you have labs (blood work) drawn today and your tests are completely normal, you will receive your results only by: MyChart Message (if you have MyChart) OR A paper copy in the mail If you have any lab test that is abnormal or we need to change your treatment, we will call you to review the results.   Testing/Procedures: None   Follow-Up: At [facility], you and your health needs are our priority.  As part of our continuing mission to provide you with exceptional heart care, we have created designated Provider Care Teams.  These Care Teams include your primary Cardiologist (physician) and Advanced Practice Providers (APPs -  Physician Assistants and Nurse Practitioners) who all work together to provide you with the care you need, when you need it.  We recommend signing up for the patient portal called "MyChart".  Sign up information is provided on this After Visit Summary.  MyChart is used to connect with patients for Virtual Visits (Telemedicine).  Patients are able to view lab/test results, encounter notes, upcoming appointments, etc.  Non-urgent messages can be sent to your provider as well.   To learn more about what you can do with MyChart, go to [email or web address].    Your next appointment:   3 month(s)  The format for your next appointment:   In Person  Provider:   Julien Nordmann, MD or Cadence Fransico Michael, [place]

## 2021-06-14 ENCOUNTER — Other Ambulatory Visit: Payer: Self-pay | Admitting: Cardiovascular Disease

## 2021-06-14 ENCOUNTER — Other Ambulatory Visit: Payer: Self-pay | Admitting: Physician Assistant

## 2021-06-14 ENCOUNTER — Other Ambulatory Visit: Payer: Self-pay | Admitting: Family Medicine

## 2021-06-14 DIAGNOSIS — M2550 Pain in unspecified joint: Secondary | ICD-10-CM

## 2021-06-14 DIAGNOSIS — R600 Localized edema: Secondary | ICD-10-CM

## 2021-06-14 NOTE — Telephone Encounter (Signed)
Requested medications are due for refill today yes  Requested medications are on the active medication list yes  Last refill 03/14/21  Last visit 01/30/21, asked to return in 4 months, no upcoming appt scheduled  Future visit scheduled NO  Notes to clinic does pass protocol, however, asked to return in 4 months and there is no upcoming appt scheduled. Please assess. Requested Prescriptions  Pending Prescriptions Disp Refills   potassium chloride (KLOR-CON) 10 MEQ tablet [Pharmacy Med Name: POTASSIUM CHLORIDE ER 10 MEQ TAB] 90 tablet 1    Sig: TAKE 1 TABLET BY MOUTH ONCE DAILY     Endocrinology:  Minerals - Potassium Supplementation Failed - 06/14/2021  2:43 PM      Failed - Cr in normal range and within 360 days    Creat  Date Value Ref Range Status  06/12/2017 1.43 (H) 0.70 - 1.18 mg/dL Final    Comment:    For patients >15 years of age, the reference limit for Creatinine is approximately 13% higher for people identified as African-American. .    Creatinine, Ser  Date Value Ref Range Status  05/16/2021 1.37 (H) 0.76 - 1.27 mg/dL Final          Passed - K in normal range and within 360 days    Potassium  Date Value Ref Range Status  05/16/2021 4.8 3.5 - 5.2 mmol/L Final          Passed - Valid encounter within last 12 months    Recent Outpatient Visits           4 months ago Essential hypertension   [facility] Malva Limes, MD   7 months ago Type 2 diabetes mellitus with stage 2 chronic kidney disease, without long-term current use of insulin [facility]   [facility] [place], [place], [name]   10 months ago Type 2 diabetes mellitus with stage 2 chronic kidney disease, without long-term current use of insulin [facility])   [facility] [place], [place], [place]   1 year ago Type 2 diabetes mellitus with stage 2 chronic kidney disease, without long-term current use of insulin [facility])   [facility] [place], [place], [place]   1 year ago CKD (chronic kidney disease), stage II   [facility] [place], Alessandra [place], [place]       Future Appointments             In 2 months Gollan, Tollie Pizza, MD [facility], [facility]

## 2021-07-08 ENCOUNTER — Other Ambulatory Visit: Payer: Self-pay | Admitting: Cardiovascular Disease

## 2021-07-23 ENCOUNTER — Other Ambulatory Visit: Payer: Self-pay | Admitting: Physician Assistant

## 2021-07-23 ENCOUNTER — Other Ambulatory Visit: Payer: Self-pay | Admitting: Family Medicine

## 2021-07-23 DIAGNOSIS — I10 Essential (primary) hypertension: Secondary | ICD-10-CM

## 2021-07-23 DIAGNOSIS — E118 Type 2 diabetes mellitus with unspecified complications: Secondary | ICD-10-CM

## 2021-08-08 ENCOUNTER — Other Ambulatory Visit: Payer: Self-pay

## 2021-08-08 ENCOUNTER — Ambulatory Visit (INDEPENDENT_AMBULATORY_CARE_PROVIDER_SITE_OTHER): Payer: Medicare PPO | Admitting: Family Medicine

## 2021-08-08 DIAGNOSIS — Z23 Encounter for immunization: Secondary | ICD-10-CM

## 2021-08-12 ENCOUNTER — Other Ambulatory Visit: Payer: Self-pay | Admitting: Cardiovascular Disease

## 2021-08-26 ENCOUNTER — Encounter: Payer: Self-pay | Admitting: Physician Assistant

## 2021-08-26 ENCOUNTER — Other Ambulatory Visit: Payer: Self-pay

## 2021-08-26 ENCOUNTER — Ambulatory Visit: Payer: Medicare PPO | Admitting: Physician Assistant

## 2021-08-26 VITALS — BP 175/75 | HR 57 | Wt 174.2 lb

## 2021-08-26 DIAGNOSIS — I152 Hypertension secondary to endocrine disorders: Secondary | ICD-10-CM

## 2021-08-26 DIAGNOSIS — E1159 Type 2 diabetes mellitus with other circulatory complications: Secondary | ICD-10-CM | POA: Diagnosis not present

## 2021-08-26 DIAGNOSIS — E118 Type 2 diabetes mellitus with unspecified complications: Secondary | ICD-10-CM

## 2021-08-26 LAB — POCT GLYCOSYLATED HEMOGLOBIN (HGB A1C): Hemoglobin A1C: 7.4 % — AB (ref 4.0–5.6)

## 2021-08-26 MED ORDER — DAPAGLIFLOZIN PROPANEDIOL 10 MG PO TABS: 10.0000 mg | ORAL_TABLET | Freq: Every day | ORAL | 3 refills | Status: DC

## 2021-08-26 MED ORDER — GLIPIZIDE ER 10 MG PO TB24: 10.0000 mg | ORAL_TABLET | Freq: Every day | ORAL | 3 refills | Status: DC

## 2021-08-26 NOTE — Assessment & Plan Note (Signed)
Initially 159/72 and then increased on repeat Advised to monitor at home, has f/u with cardiology in 2 weeks.

## 2021-08-26 NOTE — Assessment & Plan Note (Addendum)
A1c 7.4%  Goal is higher d/t age Comfortable with this Continue monitoring diet, continue w/ activity levels and consistency w/ medication Neuropathy worse R>L will continue to monitor advised pt of foot protection and cautions, he is very aware and takes good care of his feet F/u 3 mo

## 2021-08-26 NOTE — Progress Notes (Signed)
I,Sha'taria Tyson,acting as a Education administrator for [employer], [name].,have documented all relevant documentation on the behalf of [name], [name],as directed by  [name], [name] while in the presence of [name], [name]. Established patient visit   Patient: [name] Pickeral Sr.   DOB: Oct 22, 1940   81 y.o. Male  MRN: [id number] Visit Date: 08/26/2021  Today's healthcare provider: [name], [name]   Cc DMII f/u  and HTN Subjective    HPI  Diabetes Mellitus Type II, follow-up  Lab Results  Component Value Date   HGBA1C 7.4 (A) 08/26/2021   HGBA1C 7.7 (A) 01/30/2021   HGBA1C 7.3 (A) 10/22/2020   Last seen for diabetes 6 months ago.  Management since then includes continuing the same treatment. He reports excellent compliance with treatment. He is not having side effects.   Home blood sugar records: fasting range: 130-100  occasionally when he thinks about it  Episodes of hypoglycemia? Yes sweats, denies readings < 100   Current insulin regiment:none Most Recent Eye Exam: needs to schedule one  Neuropathy is no worse   --------------------------------------------------------------------------------------------------- Hypertension, follow-up  BP Readings from Last 3 Encounters:  08/26/21 (!) 175/75  06/06/21 (!) 160/72  05/16/21 138/70   Wt Readings from Last 3 Encounters:  08/26/21 174 lb 3.2 oz (79 kg)  06/06/21 170 lb 6 oz (77.3 kg)  05/16/21 172 lb 4 oz (78.1 kg)     He was last seen for hypertension 6 months ago.  BP at that visit was 134/58. Management since that visit includes continue current medications. He reports excellent compliance with treatment. He is not having side effects.  He is exercising. He is adherent to low salt diet.   Outside blood pressures are none  He does not smoke. Chews tobacco  Use of agents associated with hypertension: none.    --------------------------------------------------------------------------------------------------- Medications: Outpatient Medications Prior to Visit  Medication Sig   ACCU-CHEK AVIVA PLUS test strip USE AS DIRECTED TO CHECK FASTING BLOOD SUGAR EVERY MORNING   allopurinol (ZYLOPRIM) 300 MG tablet TAKE 1 TABLET BY MOUTH ONCE DAILY   aspirin 81 MG chewable tablet Chew 81 mg by mouth daily.   atorvastatin (LIPITOR) 20 MG tablet TAKE 1 TABLET BY MOUTH ONCE DAILY   BD SHARPS CONTAINER HOME MISC Dispose of syringes after B12 injections   Blood Glucose Monitoring Suppl (ACCU-CHEK AVIVA PLUS) w/Device KIT To check blood sugar once daily   carvedilol (COREG) 6.25 MG tablet TAKE 1 TABLET BY MOUTH TWICE A DAY   digoxin (LANOXIN) 0.125 MG tablet TAKE 1 TABLET BY MOUTH ONCE DAILY   furosemide (LASIX) 20 MG tablet TAKE 1 TABLET BY MOUTH AS DIRECTED. TAKE1 TABLET TWICE DAILY 3 DAYS PER WEEK AND1 TABLET ONCE DAILY ON OTHER DAYS.   gabapentin (NEURONTIN) 400 MG capsule Take 1 capsule (400 mg total) by mouth 3 (three) times daily.   JANUVIA 50 MG tablet TAKE 1 TABLET BY MOUTH ONCE A DAY   lisinopril (ZESTRIL) 20 MG tablet TAKE 1 TABLET BY MOUTH ONCE DAILY   meloxicam (MOBIC) 7.5 MG tablet TAKE 1 TABLET BY MOUTH TWICE DAILY   metFORMIN (GLUCOPHAGE) 1000 MG tablet TAKE 1 TABLET BY MOUTH TWICE (2) DAILY WITH A MEAL   nitroGLYCERIN (NITROSTAT) 0.4 MG SL tablet Place 1 tablet (0.4 mg total) under the tongue every 5 (five) minutes as needed for chest pain.   potassium chloride (KLOR-CON) 10 MEQ tablet TAKE 1 TABLET BY MOUTH ONCE DAILY   SYRINGE-NEEDLE, DISP, 3 ML (B-D SYRINGE/NEEDLE  3CC/25GX5/8) 25G X 5/8" 3 ML MISC For use with cyanocobalamin injections   tamsulosin (FLOMAX) 0.4 MG CAPS capsule Take 1 capsule (0.4 mg total) by mouth daily. Please schedule an office visit before anymore refills.   [DISCONTINUED] dapagliflozin propanediol (FARXIGA) 10 MG TABS tablet Take 1 tablet (10 mg total) by mouth daily.  Please schedule office visit before any future refills.   [DISCONTINUED] glipiZIDE (GLUCOTROL XL) 10 MG 24 hr tablet TAKE 1 TABLET BY MOUTH ONCE DAILY WITH BREAKFAST   isosorbide mononitrate (IMDUR) 30 MG 24 hr tablet Take 3 tablets (90 mg total) by mouth daily.   No facility-administered medications prior to visit.    Review of Systems  Constitutional:  Negative for fatigue and fever.  Respiratory:  Negative for cough and shortness of breath.   Cardiovascular:  Negative for chest pain and leg swelling.  Musculoskeletal:  Positive for back pain.  Neurological:  Positive for numbness. Negative for dizziness.      Objective    Blood pressure (!) 175/75, pulse (!) 57, weight 174 lb 3.2 oz (79 kg), SpO2 98 %.  Initially 159/72 Physical Exam Constitutional:      General: He is awake.     Appearance: He is well-developed.  HENT:     Head: Normocephalic.  Eyes:     Conjunctiva/sclera: Conjunctivae normal.  Cardiovascular:     Rate and Rhythm: Normal rate and regular rhythm.     Pulses:          Dorsalis pedis pulses are 1+ on the right side and 3+ on the left side.     Heart sounds: Normal heart sounds.  Pulmonary:     Effort: Pulmonary effort is normal.     Breath sounds: Normal breath sounds.  Feet:     Right foot:     Protective Sensation: 3 sites tested.  0 sites sensed.     Left foot:     Protective Sensation: 3 sites tested.  2 sites sensed.  Skin:    General: Skin is warm.  Neurological:     Mental Status: He is alert and oriented to person, place, and time.  Psychiatric:        Attention and Perception: Attention normal.        Mood and Affect: Mood normal.        Speech: Speech normal.        Behavior: Behavior is cooperative.    Hemoglobin A1C Latest Ref Rng & Units 08/26/2021 01/30/2021 10/22/2020  HGBA1C 4.0 - 5.6 % 7.4(A) 7.7(A) 7.3(A)  Some recent data might be hidden   Results for orders placed or performed in visit on 08/26/21  POCT HgB A1C  Result Value  Ref Range   Hemoglobin A1C 7.4 (A) 4.0 - 5.6 %   HbA1c POC (<> result, manual entry)     HbA1c, POC (prediabetic range)     HbA1c, POC (controlled diabetic range)      Assessment & Plan     Problem List Items Addressed This Visit       Cardiovascular and Mediastinum   Hypertension associated with diabetes ([place])    Initially 159/72 and then increased on repeat Advised to monitor at home, has f/u with cardiology in 2 weeks.        Relevant Medications   glipiZIDE (GLUCOTROL XL) 10 MG 24 hr tablet   dapagliflozin propanediol (FARXIGA) 10 MG TABS tablet     Endocrine   Type 2 diabetes mellitus with diabetic neuropathy, unspecified (  [place]) - Primary    A1c 7.4%  Goal is higher d/t age Comfortable with this Continue monitoring diet, continue w/ activity levels and consistency w/ medication Neuropathy worse R>L will continue to monitor advised pt of foot protection and cautions, he is very aware and takes good care of his feet F/u 3 mo      Relevant Medications   glipiZIDE (GLUCOTROL XL) 10 MG 24 hr tablet   dapagliflozin propanediol (FARXIGA) 10 MG TABS tablet    Return in about 3 months (around 11/24/2021) for CPE.      I, [name], [name] have reviewed all documentation for this visit. The documentation on  08/26/2021 for the exam, diagnosis, procedures, and orders are all accurate and complete.    [name], [name]  [facility] [phone] (phone) [phone] (fax)  [place]

## 2021-09-05 ENCOUNTER — Other Ambulatory Visit: Payer: Self-pay | Admitting: Medical

## 2021-09-05 ENCOUNTER — Other Ambulatory Visit: Payer: Self-pay | Admitting: Family Medicine

## 2021-09-05 DIAGNOSIS — I10 Essential (primary) hypertension: Secondary | ICD-10-CM

## 2021-09-05 DIAGNOSIS — M1A9XX0 Chronic gout, unspecified, without tophus (tophi): Secondary | ICD-10-CM

## 2021-09-05 NOTE — Telephone Encounter (Signed)
Requested medication (s) are due for refill today: yes  Requested medication (s) are on the active medication list: yes  Last refill:  02/25/21 #90/0  Future visit scheduled: yes  Notes to clinic:  Unable to refill per protocol due to failed labs, no updated results.      Requested Prescriptions  Pending Prescriptions Disp Refills   allopurinol (ZYLOPRIM) 300 MG tablet [Pharmacy Med Name: ALLOPURINOL 300 MG TAB] 90 tablet 1    Sig: TAKE 1 TABLET BY MOUTH ONCE DAILY     Endocrinology:  Gout Agents - allopurinol Failed - 09/05/2021  9:51 AM      Failed - Uric Acid in normal range and within 360 days    Uric Acid  Date Value Ref Range Status  04/23/2020 3.3 (L) 3.8 - 8.4 mg/dL Final    Comment:               Therapeutic target for gout patients: <6.0          Failed - Cr in normal range and within 360 days    Creat  Date Value Ref Range Status  06/12/2017 1.43 (H) 0.70 - 1.18 mg/dL Final    Comment:    For patients >77 years of age, the reference limit for Creatinine is approximately 13% higher for people identified as African-American. .    Creatinine, Ser  Date Value Ref Range Status  05/16/2021 1.37 (H) 0.76 - 1.27 mg/dL Final          Failed - CBC within normal limits and completed in the last 12 months    WBC  Date Value Ref Range Status  05/16/2021 7.1 3.4 - 10.8 x10E3/uL Final  12/15/2019 9.5 4.0 - 10.5 K/uL Final   RBC  Date Value Ref Range Status  05/16/2021 4.28 4.14 - 5.80 x10E6/uL Final  12/15/2019 3.82 (L) 4.22 - 5.81 MIL/uL Final   Hemoglobin  Date Value Ref Range Status  05/16/2021 11.4 (L) 13.0 - 17.7 g/dL Final   Hematocrit  Date Value Ref Range Status  05/16/2021 35.5 (L) 37.5 - 51.0 % Final   MCHC  Date Value Ref Range Status  05/16/2021 32.1 31.5 - 35.7 g/dL Final  12/15/2019 31.4 30.0 - 36.0 g/dL Final   [facility]  Date Value Ref Range Status  05/16/2021 26.6 26.6 - 33.0 pg Final  12/15/2019 29.1 26.0 - 34.0 pg Final   MCV  Date  Value Ref Range Status  05/16/2021 83 79 - 97 fL Final   No results found for: PLTCOUNTKUC, LABPLAT, POCPLA RDW  Date Value Ref Range Status  05/16/2021 14.3 11.6 - 15.4 % Final         Passed - Valid encounter within last 12 months    Recent Outpatient Visits           1 week ago Type 2 diabetes mellitus with complication, without long-term current use of insulin ([place])   [facility] Thedore Mins, [place], [name]   7 months ago Essential hypertension   [facility] Birdie Sons, [place]   10 months ago Type 2 diabetes mellitus with stage 2 chronic kidney disease, without long-term current use of insulin [facility])   [place], [place], [place]   1 year ago Type 2 diabetes mellitus with stage 2 chronic kidney disease, without long-term current use of insulin [facility])   [place], [place], [place]   1 year ago Type 2 diabetes mellitus with stage 2 chronic kidney  disease, without long-term current use of insulin [facility])   [place], Clearnce Sor[place], [place]       Future Appointments             Tomorrow Gollan, Kathlene November, MD [facility], [facility]   In 2 months [name], [place], [place]

## 2021-09-06 ENCOUNTER — Other Ambulatory Visit: Payer: Self-pay

## 2021-09-06 ENCOUNTER — Encounter: Payer: Self-pay | Admitting: Cardiovascular Disease

## 2021-09-06 ENCOUNTER — Ambulatory Visit: Payer: Medicare PPO | Admitting: Cardiovascular Disease

## 2021-09-06 VITALS — BP 152/62 | HR 60 | Ht 69.0 in | Wt 167.0 lb

## 2021-09-06 DIAGNOSIS — I5032 Chronic diastolic (congestive) heart failure: Secondary | ICD-10-CM

## 2021-09-06 DIAGNOSIS — E782 Mixed hyperlipidemia: Secondary | ICD-10-CM

## 2021-09-06 DIAGNOSIS — R072 Precordial pain: Secondary | ICD-10-CM

## 2021-09-06 DIAGNOSIS — N182 Chronic kidney disease, stage 2 (mild): Secondary | ICD-10-CM

## 2021-09-06 DIAGNOSIS — I208 Other forms of angina pectoris: Secondary | ICD-10-CM

## 2021-09-06 DIAGNOSIS — I10 Essential (primary) hypertension: Secondary | ICD-10-CM

## 2021-09-06 DIAGNOSIS — I25118 Atherosclerotic heart disease of native coronary artery with other forms of angina pectoris: Secondary | ICD-10-CM

## 2021-09-06 DIAGNOSIS — E118 Type 2 diabetes mellitus with unspecified complications: Secondary | ICD-10-CM

## 2021-09-06 MED ORDER — NITROGLYCERIN 0.4 MG SL SUBL: 0.4000 mg | SUBLINGUAL_TABLET | SUBLINGUAL | 6 refills | Status: DC | PRN

## 2021-09-06 NOTE — Patient Instructions (Addendum)
Medication Instructions:  - Your physician has recommended you make the following change in your medication:   1) START Nitrogylcerin 0.4 mg: (as needed) - If you experience chest pain: Place 1 nitroglycerin under your tongue, while sitting. If no relief of pain, may repeat 1 tablet every 5 minutes up to 3 tablets over 15 minutes. If no relief call 911. If you have dizziness/lightheadedness while taking nitroglycerin, stop taking and call 911.    If you need a refill on your cardiac medications before your next appointment, please call your pharmacy.   Lab work: No new labs needed  Testing/Procedures: - Your physician has requested that you have a Scientist, physiological (chemical stress test/ cardiac nuclear scan).   [facility] MYOVIEW  Your caregiver has ordered a Stress Test with nuclear imaging. The purpose of this test is to evaluate the blood supply to your heart muscle. This procedure is referred to as a "Non-Invasive Stress Test." This is because other than having an IV started in your vein, nothing is inserted or "invades" your body. Cardiac stress tests are done to find areas of poor blood flow to the heart by determining the extent of coronary artery disease (CAD). Some patients exercise on a treadmill, which naturally increases the blood flow to your heart, while others who are  unable to walk on a treadmill due to physical limitations have a pharmacologic/chemical stress agent called Lexiscan . This medicine will mimic walking on a treadmill by temporarily increasing your coronary blood flow.   Please note: these test may take anywhere between 2-4 hours to complete  PLEASE REPORT TO [facility] MEDICAL MALL ENTRANCE  THE VOLUNTEERS AT THE FIRST DESK WILL DIRECT YOU WHERE TO GO  Date of Procedure:_____________________________________  Arrival Time for Procedure:______________________________  Instructions regarding medication:   __xx__ : Hold diabetes medication morning of procedure (ALL ORAL/  INSULIN)  __xx__:  Hold lasix (furosemide) the morning of your procedure   __xx__:  Bonita Quin may take all of your other regular morning medications the day of your test with enough water to get them down safely  PLEASE NOTIFY THE OFFICE AT LEAST 24 HOURS IN ADVANCE IF YOU ARE UNABLE TO KEEP YOUR APPOINTMENT.  [phone] AND  PLEASE NOTIFY NUCLEAR MEDICINE AT [facility] AT LEAST 24 HOURS IN ADVANCE IF YOU ARE UNABLE TO KEEP YOUR APPOINTMENT. [phone]  How to prepare for your Myoview test:  Do not eat or drink after midnight No caffeine for 24 hours prior to test No smoking 24 hours prior to test. Your medication may be taken with water.  If your doctor stopped a medication because of this test, do not take that medication. Ladies, please do not wear dresses.  Skirts or pants are appropriate. Please wear a short sleeve shirt. No perfume, cologne or lotion. Wear comfortable walking shoes. No heels!   Follow-Up: At [facility], you and your health needs are our priority.  As part of our continuing mission to provide you with exceptional heart care, we have created designated Provider Care Teams.  These Care Teams include your primary Cardiologist (physician) and Advanced Practice Providers (APPs -  Physician Assistants and Nurse Practitioners) who all work together to provide you with the care you need, when you need it.  You will need a follow up appointment in 6 months, app ok  Providers on your designated Care Team:   Nicolasa Ducking, NP Eula Listen, [name] Cadence Fransico Michael, [place]  COVID-19 Vaccine Information can be found at: [email or web address] For questions related  to vaccine distribution or appointments, please email [email or web address] or call [phone].     Cardiac Nuclear Scan A cardiac nuclear scan is a test that is done to check the flow of blood to your heart. It is done when you are resting and when you are  exercising. The test looks for problems such as: Not enough blood reaching a portion of the heart. The heart muscle not working as it should. You may need this test if: You have heart disease. You have had lab results that are not normal. You have had heart surgery or a balloon procedure to open up blocked arteries (angioplasty). You have chest pain. You have shortness of breath. In this test, a special dye (tracer) is put into your bloodstream. The tracer will travel to your heart. A camera will then take pictures of your heart to see how the tracer moves through your heart. This test is usually done at a hospital and takes 2-4 hours. Tell a doctor about: Any allergies you have. All medicines you are taking, including vitamins, herbs, eye drops, creams, and over-the-counter medicines. Any problems you or family members have had with anesthetic medicines. Any blood disorders you have. Any surgeries you have had. Any medical conditions you have. Whether you are pregnant or may be pregnant. What are the risks? Generally, this is a safe test. However, problems may occur, such as: Serious chest pain and heart attack. This is only a risk if the stress portion of the test is done. Rapid heartbeat. A feeling of warmth in your chest. This feeling usually does not last long. Allergic reaction to the tracer. What happens before the test? Ask your doctor about changing or stopping your normal medicines. This is important. Follow instructions from your doctor about what you cannot eat or drink. Remove your jewelry on the day of the test. What happens during the test? An IV tube will be inserted into one of your veins. Your doctor will give you a small amount of tracer through the IV tube. You will wait for 20-40 minutes while the tracer moves through your bloodstream. Your heart will be monitored with an electrocardiogram (ECG). You will lie down on an exam table. Pictures of your heart will be  taken for about 15-20 minutes. You may also have a stress test. For this test, one of these things may be done: You will be asked to exercise on a treadmill or a stationary bike. You will be given medicines that will make your heart work harder. This is done if you are unable to exercise. When blood flow to your heart has peaked, a tracer will again be given through the IV tube. After 20-40 minutes, you will get back on the exam table. More pictures will be taken of your heart. Depending on the tracer that is used, more pictures may need to be taken 3-4 hours later. Your IV tube will be removed when the test is over. The test may vary among doctors and hospitals. What happens after the test? Ask your doctor: Whether you can return to your normal schedule, including diet, activities, and medicines. Whether you should drink more fluids. This will help to remove the tracer from your body. Drink enough fluid to keep your pee (urine) pale yellow. Ask your doctor, or the department that is doing the test: When will my results be ready? How will I get my results? Summary A cardiac nuclear scan is a test that is done to check the flow of  blood to your heart. Tell your doctor whether you are pregnant or may be pregnant. Before the test, ask your doctor about changing or stopping your normal medicines. This is important. Ask your doctor whether you can return to your normal activities. You may be asked to drink more fluids. This information is not intended to replace advice given to you by your health care provider. Make sure you discuss any questions you have with your health care provider. Document Revised: 03/27/2021 Document Reviewed: 12/26/2020 Elsevier Patient Education  2022 [employer].

## 2021-09-06 NOTE — Progress Notes (Signed)
Cardiology Office Note  Date:  09/06/2021   ID:  [name] Gross Sr., DOB 10/07/1940, MRN [id number]  PCP:  [name], [name]   Cc: Chest pain  HPI:  [name] is a very pleasant 81 year old gentleman with  coronary artery disease,  bypass surgery in 2004,  occluded graft to the diagonal branch with 3 other patent grafts to the RCA, OM with a LIMA to the LAD by catheterization in 2007  who presents for routine followup Of his coronary artery disease .   Last seen in clinic oct 2021 Seen by cadence 10/22, chest pain Imdur increased to 90  Skips beats at times No chest pain on exertion Takes tums  Lasix daily Denies shortness of breath on exertion  BP up and down Checks at home,  High in MDs offices   active [place]  Labs reviewed A1C 7.7 Total chol 114  EKG from last clinic visit reviewed Normal sinus rhythm  Past medical history reviewed  emergency room after syncope episode Dec 15, 2019 [place] the [place], felt sugar was low, got clammy and lightheaded went into the house checked his sugar it was 43, ate a sandwich, [facility], started feeling better 1 hour later began lightheaded and clammy, had brief syncopal episode lasting several seconds, witnessed by his son  creatinine 1.46 hemoglobin 11 glucose 268  Echocardiogram February 01, 2020 ejection fraction 50 to 82%, Grade 2 diastolic dysfunction, right heart pressures 45 mmHg, hypokinesis basal inferoseptal wall  Stress test August 02, 2019 Ischemia in the basal and mid inferior and inferoseptal myocardium   PMH:   has a past medical history of Arthritis, Back pain, CAD (coronary artery disease), CKD (chronic kidney disease), stage II-III, Colon polyps, DM2 (diabetes mellitus, type 2) ([place]), GERD (gastroesophageal reflux disease), Glaucoma, Gout, HLD (hyperlipidemia), HTN (hypertension), and Neuropathy.  PSH:    Past Surgical History:  Procedure Laterality Date   BACK SURGERY      Cyst removed   CORONARY ARTERY BYPASS GRAFT  2004   KNEE SURGERY      Current Outpatient Medications  Medication Sig Dispense Refill   ACCU-CHEK AVIVA PLUS test strip USE AS DIRECTED TO CHECK FASTING BLOOD SUGAR EVERY MORNING 100 each 4   allopurinol (ZYLOPRIM) 300 MG tablet TAKE 1 TABLET BY MOUTH ONCE DAILY 90 tablet 0   aspirin 81 MG chewable tablet Chew 81 mg by mouth daily.     atorvastatin (LIPITOR) 20 MG tablet TAKE 1 TABLET BY MOUTH ONCE DAILY 90 tablet 0   BD SHARPS CONTAINER HOME MISC Dispose of syringes after B12 injections 1 each 5   Blood Glucose Monitoring Suppl (ACCU-CHEK AVIVA PLUS) w/Device KIT To check blood sugar once daily 1 kit 0   carvedilol (COREG) 6.25 MG tablet TAKE 1 TABLET BY MOUTH TWICE A DAY 180 tablet 0   dapagliflozin propanediol (FARXIGA) 10 MG TABS tablet Take 1 tablet (10 mg total) by mouth daily. Please schedule office visit before any future refills. 90 tablet 3   digoxin (LANOXIN) 0.125 MG tablet TAKE 1 TABLET BY MOUTH ONCE DAILY 90 tablet 1   furosemide (LASIX) 20 MG tablet TAKE 1 TABLET BY MOUTH AS DIRECTED. TAKE1 TABLET TWICE DAILY 3 DAYS PER WEEK AND1 TABLET ONCE DAILY ON OTHER DAYS. 120 tablet 2   gabapentin (NEURONTIN) 400 MG capsule Take 1 capsule (400 mg total) by mouth 3 (three) times daily. 90 capsule 5   glipiZIDE (GLUCOTROL XL) 10 MG 24 hr tablet Take 1  tablet (10 mg total) by mouth daily with breakfast. 90 tablet 3   isosorbide mononitrate (IMDUR) 30 MG 24 hr tablet TAKE 3 TABLETS BY MOUTH DAILY 270 tablet 0   JANUVIA 50 MG tablet TAKE 1 TABLET BY MOUTH ONCE A DAY 90 tablet 3   lisinopril (ZESTRIL) 20 MG tablet TAKE 1 TABLET BY MOUTH ONCE DAILY 30 tablet 0   meloxicam (MOBIC) 7.5 MG tablet TAKE 1 TABLET BY MOUTH TWICE DAILY (Patient taking differently: as needed for pain.) 180 tablet 0   metFORMIN (GLUCOPHAGE) 1000 MG tablet TAKE 1 TABLET BY MOUTH TWICE (2) DAILY WITH A MEAL 180 tablet 3   potassium chloride (KLOR-CON) 10 MEQ tablet TAKE 1  TABLET BY MOUTH ONCE DAILY 90 tablet 1   SYRINGE-NEEDLE, DISP, 3 ML (B-D SYRINGE/NEEDLE 3CC/25GX5/8) 25G X 5/8" 3 ML MISC For use with cyanocobalamin injections 50 each 0   tamsulosin (FLOMAX) 0.4 MG CAPS capsule Take 1 capsule (0.4 mg total) by mouth daily. Please schedule an office visit before anymore refills. 30 capsule 0   nitroGLYCERIN (NITROSTAT) 0.4 MG SL tablet Place 1 tablet (0.4 mg total) under the tongue every 5 (five) minutes as needed for chest pain. 25 tablet 6   No current facility-administered medications for this visit.     Allergies:   Ramipril   Social History:  The patient  reports that he has never smoked. His smokeless tobacco use includes chew. He reports that he does not drink alcohol and does not use drugs.   Family History:   Family history is unknown by patient.    Review of Systems: Review of Systems  Constitutional: Negative.   HENT: Negative.    Respiratory: Negative.    Cardiovascular:  Positive for chest pain.  Gastrointestinal: Negative.   Musculoskeletal: Negative.   Neurological: Negative.   Psychiatric/Behavioral: Negative.    All other systems reviewed and are negative.  PHYSICAL EXAM: VS:  BP (!) 152/62    Pulse 60    Ht '5\' 9"'  (1.753 m)    Wt 167 lb (75.8 kg)    SpO2 97%    BMI 24.66 kg/m  , BMI Body mass index is 24.66 kg/m.  Constitutional:  oriented to person, place, and time. No distress.  HENT:  Head: Grossly normal Eyes:  no discharge. No scleral icterus.  Neck: No JVD, no carotid bruits  Cardiovascular: Regular rate and rhythm, no murmurs appreciated Pulmonary/Chest: Clear to auscultation bilaterally, no wheezes or rails Abdominal: Soft.  no distension.  no tenderness.  Musculoskeletal: Normal range of motion Neurological:  normal muscle tone. Coordination normal. No atrophy Skin: Skin warm and dry Psychiatric: normal affect, pleasant    Recent Labs: 05/16/2021: ALT 19; BUN 15; Creatinine, Ser 1.37; Hemoglobin 11.4;  Platelets 238; Potassium 4.8; Sodium 141    Lipid Panel Lab Results  Component Value Date   CHOL 114 05/16/2021   HDL 35 (L) 05/16/2021   LDLCALC 59 05/16/2021   TRIG 104 05/16/2021    Wt Readings from Last 3 Encounters:  09/06/21 167 lb (75.8 kg)  08/26/21 174 lb 3.2 oz (79 kg)  06/06/21 170 lb 6 oz (77.3 kg)      ASSESSMENT AND PLAN:  CAD with stable angina Very active,  Reports having continued chest pain, has to stop what he is doing right chest pain resolves History of chronic symptoms similar to this unclear if it is getting worse or stable He does have nitro but has not been using it Still trying  to work, 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 No changes made to the medications.   Type 2 diabetes mellitus with complication, without long-term current use of insulin (HCC) A1c typically in the 7 range Recommended he try to get A1c in the 6 range  Hyperlipidemia Cholesterol is at goal on the current lipid regimen. No changes to the medications were made.    Total encounter time more than 40 minutes  Greater than 50% was spent in counseling and coordination of care with the patient   No orders of the defined types were placed in this encounter.    Signed, Esmond Plants, M.D., Ph.D. 09/06/2021  [place], [place]

## 2021-09-12 ENCOUNTER — Encounter
Admission: RE | Admit: 2021-09-12 | Discharge: 2021-09-12 | Disposition: A | Discharge status: Home / Self Care | Payer: Medicare PPO | Source: Ambulatory Visit | Attending: Cardiovascular Disease | Admitting: Cardiovascular Disease

## 2021-09-12 ENCOUNTER — Other Ambulatory Visit: Payer: Self-pay

## 2021-09-12 DIAGNOSIS — R072 Precordial pain: Secondary | ICD-10-CM | POA: Insufficient documentation

## 2021-09-12 LAB — NM MYOCAR MULTI W/SPECT W/WALL MOTION / EF
Estimated workload: 1
Exercise duration (min): 0 min
Exercise duration (sec): 0 s
LV dias vol: 131 mL (ref 62–150)
LV sys vol: 64 mL
MPHR: 141 {beats}/min
Nuc Stress EF: 51 %
Peak HR: 88 {beats}/min
Percent HR: 66 %
Rest HR: 56 {beats}/min
Rest Nuclear Isotope Dose: 10.9 mCi
SDS: 5
SRS: 0
SSS: 0
Stress Nuclear Isotope Dose: 32.4 mCi
TID: 0.98

## 2021-09-12 MED ORDER — TECHNETIUM TC 99M TETROFOSMIN IV KIT
30.0000 | PACK | Freq: Once | INTRAVENOUS | Status: AC | PRN
  Administered 2021-09-12: 32.39 via INTRAVENOUS

## 2021-09-12 MED ORDER — TECHNETIUM TC 99M TETROFOSMIN IV KIT: 32.3900 | PACK | Freq: Once | INTRAVENOUS | Status: DC | PRN

## 2021-09-12 MED ORDER — REGADENOSON 0.4 MG/5ML IV SOLN
0.4000 mg | Freq: Once | INTRAVENOUS | Status: AC
  Administered 2021-09-12: 0.4 mg via INTRAVENOUS

## 2021-09-12 MED ORDER — TECHNETIUM TC 99M TETROFOSMIN IV KIT
10.0000 | PACK | Freq: Once | INTRAVENOUS | Status: AC | PRN
  Administered 2021-09-12: 10.86 via INTRAVENOUS

## 2021-09-12 MED ORDER — REGADENOSON 0.4 MG/5ML IV SOLN: 0.4000 mg | Freq: Once | INTRAVENOUS | Status: DC

## 2021-09-16 ENCOUNTER — Telehealth: Payer: Self-pay

## 2021-09-16 NOTE — Telephone Encounter (Signed)
Able to reach pt regarding his recent Lexiscan Dr. Mariah Milling had a chance to review his results and advised   "Stress test  There does appear to be ischemia inferior wall, mildly depressed ejection fraction  Given his anginal symptoms, need to know if he is interested in cardiac catheterization  His symptoms could indicate blockage bottom part of the heart "  [name] very thankful for the phone call of his results, all questions and concerns were address, would like to proceed with coming in to discuss options for a heart cath, pt schedule for Thursday 2/23 at 08:25 am with [name] Fransico Michael, [name]

## 2021-09-18 ENCOUNTER — Other Ambulatory Visit: Payer: Self-pay | Admitting: Physician Assistant

## 2021-09-18 DIAGNOSIS — G629 Polyneuropathy, unspecified: Secondary | ICD-10-CM

## 2021-09-18 NOTE — Progress Notes (Signed)
Cardiology Office Note:    Date:  09/19/2021   ID:  [name] Gross Sr., DOB 10-03-1940, MRN [id number]  PCP:  [name], [name]  [facility] Cardiologist:  [name], MD  [place] Electrophysiologist:  None   Referring MD: [name], [name]   Chief Complaint: Discuss heart catheterization  History of Present Illness:    [name] Cumbie [place] Sr. is a 81 y.o. male with a hx of  with a hx of CAD s/p CABG (2004), HFpEF, hypertension, hyperlipidemia, CKD 3, DM 2 who presents for follow-up stress test.    H/o CAD s/p CABG x4 in 2004 with subsequent catheterization in 2007 revealing 3 and 4 patent grafts (occluded vein graft to the diagonal). Recent medication changes include addition of Cialis and discontinuation of Imdur (given interaction between these two mediations). Coreg has also been discontinued with patient previously on Coreg 6.$RemoveBe'25mg'DoNYxkiJL$  BID but no longer taking for unclear reasons at this time.    Myoview 07/2019 was abnormal, high risk test with possible ischemia, LVEF 43%.  Echo 07/2019 showed LVEF 50-55%, G2DD, WMA, mildly dilated LA, moderately elevated pulmonary artery pressure.      Seen 05/16/21 and reported chest pain. Imdur was increased to 90 mg daily.   Last seen 09/06/21 and reported chest pain and was still having exertional chest pain. A myoview lexiscan was ordered, this showed ischemia in the inferior wall with perfusion defect, moderate reversible in the inferior wall and apical region, EF 43%, moderate risk scan. Patient was brought in to discuss cardiac catheterization.  Today, the myoview stress test was reviewed. He reports he still has intermittent chest pain. Overall functional status has decreased over the last 6 months due to chest pain and shortness of breath. Has to stop and take a 10-15 minute breath. Cardiac cath discussed, which he is welling to pursue. BP very high, but has not had his medications yet today.     Past Medical  History:  Diagnosis Date   Arthritis    Back pain    hx   CAD (coronary artery disease)    a. 2004 s/p CABG x 4 (LIMA->LAD, VG->Diag, VG->OM, VG->RCA); b. 2007 Cath: VG->Diag occluded, otw 3/4 patent grafts.   CKD (chronic kidney disease), stage II-III    Colon polyps    DM2 (diabetes mellitus, type 2) (HCC)    stable   GERD (gastroesophageal reflux disease)    Glaucoma    Gout    HLD (hyperlipidemia)    HTN (hypertension)    Neuropathy     Past Surgical History:  Procedure Laterality Date   BACK SURGERY     Cyst removed   CORONARY ARTERY BYPASS GRAFT  2004   KNEE SURGERY      Current Medications: Current Meds  Medication Sig   ACCU-CHEK AVIVA PLUS test strip USE AS DIRECTED TO CHECK FASTING BLOOD SUGAR EVERY MORNING   allopurinol (ZYLOPRIM) 300 MG tablet TAKE 1 TABLET BY MOUTH ONCE DAILY   aspirin 81 MG chewable tablet Chew 81 mg by mouth daily.   atorvastatin (LIPITOR) 20 MG tablet TAKE 1 TABLET BY MOUTH ONCE DAILY   BD SHARPS CONTAINER HOME MISC Dispose of syringes after B12 injections   Blood Glucose Monitoring Suppl (ACCU-CHEK AVIVA PLUS) w/Device KIT To check blood sugar once daily   carvedilol (COREG) 6.25 MG tablet TAKE 1 TABLET BY MOUTH TWICE A DAY   dapagliflozin propanediol (FARXIGA) 10 MG TABS tablet Take 1 tablet (10 mg total) by mouth daily.  Please schedule office visit before any future refills.   digoxin (LANOXIN) 0.125 MG tablet TAKE 1 TABLET BY MOUTH ONCE DAILY   furosemide (LASIX) 20 MG tablet TAKE 1 TABLET BY MOUTH AS DIRECTED. TAKE1 TABLET TWICE DAILY 3 DAYS PER WEEK AND1 TABLET ONCE DAILY ON OTHER DAYS.   gabapentin (NEURONTIN) 400 MG capsule TAKE 1 CAPSULE BY MOUTH 3 TIMES A DAY   glipiZIDE (GLUCOTROL XL) 10 MG 24 hr tablet Take 1 tablet (10 mg total) by mouth daily with breakfast.   isosorbide mononitrate (IMDUR) 30 MG 24 hr tablet TAKE 3 TABLETS BY MOUTH DAILY   JANUVIA 50 MG tablet TAKE 1 TABLET BY MOUTH ONCE A DAY   lisinopril (ZESTRIL) 20 MG  tablet TAKE 1 TABLET BY MOUTH ONCE DAILY   meloxicam (MOBIC) 7.5 MG tablet TAKE 1 TABLET BY MOUTH TWICE DAILY (Patient taking differently: as needed for pain.)   metFORMIN (GLUCOPHAGE) 1000 MG tablet TAKE 1 TABLET BY MOUTH TWICE (2) DAILY WITH A MEAL   nitroGLYCERIN (NITROSTAT) 0.4 MG SL tablet Place 1 tablet (0.4 mg total) under the tongue every 5 (five) minutes as needed for chest pain.   potassium chloride (KLOR-CON) 10 MEQ tablet TAKE 1 TABLET BY MOUTH ONCE DAILY   SYRINGE-NEEDLE, DISP, 3 ML (B-D SYRINGE/NEEDLE 3CC/25GX5/8) 25G X 5/8" 3 ML MISC For use with cyanocobalamin injections   tamsulosin (FLOMAX) 0.4 MG CAPS capsule Take 1 capsule (0.4 mg total) by mouth daily. Please schedule an office visit before anymore refills.   [DISCONTINUED] gabapentin (NEURONTIN) 400 MG capsule Take 1 capsule (400 mg total) by mouth 3 (three) times daily.   Current Facility-Administered Medications for the 09/19/21 encounter (Office Visit) with Kathlen Mody, Ever Halberg H, [name]  Medication   sodium chloride flush (NS) 0.9 % injection 3 mL     Allergies:   Ramipril   Social History   Socioeconomic History   Marital status: Widowed    Spouse name: Not on file   Number of children: 3   Years of education: Not on file   Highest education level: 8th grade  Occupational History   Not on file  Tobacco Use   Smoking status: Never   Smokeless tobacco: Current    Types: Chew   Tobacco comments:    chew tobacco - 1 pack last 3 weeks  Vaping Use   Vaping Use: Never used  Substance and Sexual Activity   Alcohol use: No    Alcohol/week: 0.0 standard drinks   Drug use: No   Sexual activity: Not Currently  Other Topics Concern   Not on file  Social History Narrative   Not on file   Social Determinants of Health   Financial Resource Strain: Not on file  Food Insecurity: Not on file  Transportation Needs: Not on file  Physical Activity: Not on file  Stress: Not on file  Social Connections: Not on file      Family History: The patient's Family history is unknown by patient.  ROS:   Please see the history of present illness.     All other systems reviewed and are negative.  EKGs/Labs/Other Studies Reviewed:    The following studies were reviewed today:  Echo 07/2019  1. Left ventricular ejection fraction, by visual estimation, is 50 to  55%. The left ventricle has normal function. There is no left ventricular  hypertrophy.   2. Left ventricular diastolic parameters are consistent with Grade II  diastolic dysfunction (pseudonormalization).   3. Mildly dilated left ventricular internal  cavity size.   4. The left ventricle demonstrates regional wall motion  abnormalities.Hypokinesis of the basal septal, anteroseptal,  inferior/inferoseptal regions   5. Global right ventricle has normal systolic function.The right  ventricular size is normal. No increase in right ventricular wall  thickness.   6. Left atrial size was mildly dilated.   7. Moderately elevated pulmonary artery systolic pressure.   8. The tricuspid regurgitant velocity is 2.94 m/s, and with an assumed  right atrial pressure of 10 mmHg, the estimated right ventricular systolic  pressure is moderately elevated at 44.7 mmHg.   9. Challenging images   Myoview stress test 08/2021 Narrative & Impression  Pharmacological myocardial perfusion imaging study with ischemia in the inferior wall Mild fixed perfusion defect inferior wall, moderate reversible defect also noted inferior wall and apical region moderate Normal wall motion, EF estimated at 43% No EKG changes concerning for ischemia at peak stress or in recovery. CT attenuation correction images with aortic atherosclerosis, three-vessel coronary calcification Moderate risk scan     Signed, Esmond Plants, MD, Ph.D [facility]      EKG:  EKG is  ordered today.  The ekg ordered today demonstrates NSR, 61bpm, PVCs, RBBB, NO significant ST/T wave changes  Recent  Labs: 05/16/2021: ALT 19; BUN 15; Creatinine, Ser 1.37; Hemoglobin 11.4; Platelets 238; Potassium 4.8; Sodium 141  Recent Lipid Panel    Component Value Date/Time   CHOL 114 05/16/2021 0923   TRIG 104 05/16/2021 0923   TRIG 136 02/28/2008 0000   HDL 35 (L) 05/16/2021 0923   CHOLHDL 3.3 05/16/2021 0923   LDLCALC 59 05/16/2021 0923    Physical Exam:    VS:  BP (!) 190/60 (BP Location: Left Arm, Patient Position: Sitting, Cuff Size: Normal)    Pulse 61    Ht 5' 9.5" (1.765 m)    Wt 169 lb (76.7 kg)    SpO2 98%    BMI 24.60 kg/m     Wt Readings from Last 3 Encounters:  09/19/21 169 lb (76.7 kg)  09/06/21 167 lb (75.8 kg)  08/26/21 174 lb 3.2 oz (79 kg)     GEN:  Well nourished, well developed in no acute distress HEENT: Normal NECK: No JVD; No carotid bruits LYMPHATICS: No lymphadenopathy CARDIAC: RRR, no murmurs, rubs, gallops RESPIRATORY:  Clear to auscultation without rales, wheezing or rhonchi  ABDOMEN: Soft, non-tender, non-distended MUSCULOSKELETAL:  No edema; No deformity  SKIN: Warm and dry NEUROLOGIC:  Alert and oriented x 3 PSYCHIATRIC:  Normal affect   ASSESSMENT:    1. Stable angina ([place])   2. Coronary artery disease of native artery of native heart with stable angina pectoris ([place])   3. Chronic systolic heart failure ([place])   4. Essential hypertension   5. Hyperlipidemia, mixed   6. CKD (chronic kidney disease), stage II   7. Abnormal stress test    PLAN:    In order of problems listed above:  Chest pain  Abnormal Myoview Lexiscan CAD s/p remote cABG Recent MPI showed ischemia in the inferior and apical wall, with EF 43%. Patient is still having exertional chest pain and shortness of breath. Cardiac catheterization discussed and patient is willing to pursue. Continue Aspirin, statin, Coreg, Imdur. Further recommendations per cath. Monitor kidney function post-cath.  Risks and benefits of cardiac catheterization have been discussed with the patient.  These  include bleeding, infection, kidney damage, stroke, heart attack, death.  The patient understands these risks and is willing to proceed.   Cardiomyopathy  EF 43% HFmrEF EF estimated 43 % on recent MPI. 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 Plan for LHC as above, can evaluate EF at cath. Continue Coreg, lisinopril and [place]. Continue GDMT as able at follow-up.   HTN BP very high today, but he has not had his medications yet today. Recommended he go home and take BP medications. Continue Coreg 6.84m BID, Imdur 936mdialy, lisinopril 20104maily.  CKD stage 3 Most recent Scr/BUN 1.37/15. Check BMET today prior to cath.   DM2 Most recent A1C 7.4. Management per PCP.   HLD LDL 59, Continue Lipitor 57m52mily.   Disposition: Follow up  post-cath  with MD/APP     Signed, Jarrid Lienhard H FuNinfa Meeker-C  09/19/2021 9:03 AM    [facility]

## 2021-09-18 NOTE — H&P (View-Only) (Signed)
Cardiology Office Note:    Date:  09/19/2021   ID:  [name] Gross Sr., DOB [birth year], MRN [id number]  PCP:  [name], [name]  [facility] Cardiologist:  [name], MD  [place] Electrophysiologist:  None   Referring MD: [name], [name]   Chief Complaint: Discuss heart catheterization  History of Present Illness:    [name] [place] Sr. is a 81 y.o. male with a hx of  with a hx of CAD s/p CABG (2004), HFpEF, hypertension, hyperlipidemia, CKD 3, DM 2 who presents for follow-up stress test.    H/o CAD s/p CABG x4 in 2004 with subsequent catheterization in 2007 revealing 3 and 4 patent grafts (occluded vein graft to the diagonal). Recent medication changes include addition of Cialis and discontinuation of Imdur (given interaction between these two mediations). Coreg has also been discontinued with patient previously on Coreg 6.$RemoveBe'25mg'WoOhYykua$  BID but no longer taking for unclear reasons at this time.    Myoview 07/2019 was abnormal, high risk test with possible ischemia, LVEF 43%.  Echo 07/2019 showed LVEF 50-55%, G2DD, WMA, mildly dilated LA, moderately elevated pulmonary artery pressure.      Seen 05/16/21 and reported chest pain. Imdur was increased to 90 mg daily.   Last seen 09/06/21 and reported chest pain and was still having exertional chest pain. A myoview lexiscan was ordered, this showed ischemia in the inferior wall with perfusion defect, moderate reversible in the inferior wall and apical region, EF 43%, moderate risk scan. Patient was brought in to discuss cardiac catheterization.  Today, the myoview stress test was reviewed. He reports he still has intermittent chest pain. Overall functional status has decreased over the last 6 months due to chest pain and shortness of breath. Has to stop and take a 10-15 minute breath. Cardiac cath discussed, which he is welling to pursue. BP very high, but has not had his medications yet today.     Past Medical  History:  Diagnosis Date   Arthritis    Back pain    hx   CAD (coronary artery disease)    a. 2004 s/p CABG x 4 (LIMA->LAD, VG->Diag, VG->OM, VG->RCA); b. 2007 Cath: VG->Diag occluded, otw 3/4 patent grafts.   CKD (chronic kidney disease), stage II-III    Colon polyps    DM2 (diabetes mellitus, type 2) (HCC)    stable   GERD (gastroesophageal reflux disease)    Glaucoma    Gout    HLD (hyperlipidemia)    HTN (hypertension)    Neuropathy     Past Surgical History:  Procedure Laterality Date   BACK SURGERY     Cyst removed   CORONARY ARTERY BYPASS GRAFT  2004   KNEE SURGERY      Current Medications: Current Meds  Medication Sig   ACCU-CHEK AVIVA PLUS test strip USE AS DIRECTED TO CHECK FASTING BLOOD SUGAR EVERY MORNING   allopurinol (ZYLOPRIM) 300 MG tablet TAKE 1 TABLET BY MOUTH ONCE DAILY   aspirin 81 MG chewable tablet Chew 81 mg by mouth daily.   atorvastatin (LIPITOR) 20 MG tablet TAKE 1 TABLET BY MOUTH ONCE DAILY   BD SHARPS CONTAINER HOME MISC Dispose of syringes after B12 injections   Blood Glucose Monitoring Suppl (ACCU-CHEK AVIVA PLUS) w/Device KIT To check blood sugar once daily   carvedilol (COREG) 6.25 MG tablet TAKE 1 TABLET BY MOUTH TWICE A DAY   dapagliflozin propanediol (FARXIGA) 10 MG TABS tablet Take 1 tablet (10 mg total) by mouth daily.  Please schedule office visit before any future refills.   digoxin (LANOXIN) 0.125 MG tablet TAKE 1 TABLET BY MOUTH ONCE DAILY   furosemide (LASIX) 20 MG tablet TAKE 1 TABLET BY MOUTH AS DIRECTED. TAKE1 TABLET TWICE DAILY 3 DAYS PER WEEK AND1 TABLET ONCE DAILY ON OTHER DAYS.   gabapentin (NEURONTIN) 400 MG capsule TAKE 1 CAPSULE BY MOUTH 3 TIMES A DAY   glipiZIDE (GLUCOTROL XL) 10 MG 24 hr tablet Take 1 tablet (10 mg total) by mouth daily with breakfast.   isosorbide mononitrate (IMDUR) 30 MG 24 hr tablet TAKE 3 TABLETS BY MOUTH DAILY   JANUVIA 50 MG tablet TAKE 1 TABLET BY MOUTH ONCE A DAY   lisinopril (ZESTRIL) 20 MG  tablet TAKE 1 TABLET BY MOUTH ONCE DAILY   meloxicam (MOBIC) 7.5 MG tablet TAKE 1 TABLET BY MOUTH TWICE DAILY (Patient taking differently: as needed for pain.)   metFORMIN (GLUCOPHAGE) 1000 MG tablet TAKE 1 TABLET BY MOUTH TWICE (2) DAILY WITH A MEAL   nitroGLYCERIN (NITROSTAT) 0.4 MG SL tablet Place 1 tablet (0.4 mg total) under the tongue every 5 (five) minutes as needed for chest pain.   potassium chloride (KLOR-CON) 10 MEQ tablet TAKE 1 TABLET BY MOUTH ONCE DAILY   SYRINGE-NEEDLE, DISP, 3 ML (B-D SYRINGE/NEEDLE 3CC/25GX5/8) 25G X 5/8" 3 ML MISC For use with cyanocobalamin injections   tamsulosin (FLOMAX) 0.4 MG CAPS capsule Take 1 capsule (0.4 mg total) by mouth daily. Please schedule an office visit before anymore refills.   [DISCONTINUED] gabapentin (NEURONTIN) 400 MG capsule Take 1 capsule (400 mg total) by mouth 3 (three) times daily.   Current Facility-Administered Medications for the 09/19/21 encounter (Office Visit) with [name], [name] H, [name]  Medication   sodium chloride flush (NS) 0.9 % injection 3 mL     Allergies:   Ramipril   Social History   Socioeconomic History   Marital status: Widowed    Spouse name: Not on file   Number of children: 3   Years of education: Not on file   Highest education level: 8th grade  Occupational History   Not on file  Tobacco Use   Smoking status: Never   Smokeless tobacco: Current    Types: Chew   Tobacco comments:    chew tobacco - 1 pack last 3 weeks  Vaping Use   Vaping Use: Never used  Substance and Sexual Activity   Alcohol use: No    Alcohol/week: 0.0 standard drinks   Drug use: No   Sexual activity: Not Currently  Other Topics Concern   Not on file  Social History Narrative   Not on file   Social Determinants of Health   Financial Resource Strain: Not on file  Food Insecurity: Not on file  Transportation Needs: Not on file  Physical Activity: Not on file  Stress: Not on file  Social Connections: Not on file      Family History: The patient's Family history is unknown by patient.  ROS:   Please see the history of present illness.     All other systems reviewed and are negative.  EKGs/Labs/Other Studies Reviewed:    The following studies were reviewed today:  Echo 07/2019  1. Left ventricular ejection fraction, by visual estimation, is 50 to  55%. The left ventricle has normal function. There is no left ventricular  hypertrophy.   2. Left ventricular diastolic parameters are consistent with Grade II  diastolic dysfunction (pseudonormalization).   3. Mildly dilated left ventricular internal  cavity size.   4. The left ventricle demonstrates regional wall motion  abnormalities.Hypokinesis of the basal septal, anteroseptal,  inferior/inferoseptal regions   5. Global right ventricle has normal systolic function.The right  ventricular size is normal. No increase in right ventricular wall  thickness.   6. Left atrial size was mildly dilated.   7. Moderately elevated pulmonary artery systolic pressure.   8. The tricuspid regurgitant velocity is 2.94 m/s, and with an assumed  right atrial pressure of 10 mmHg, the estimated right ventricular systolic  pressure is moderately elevated at 44.7 mmHg.   9. Challenging images   Myoview stress test 08/2021 Narrative & Impression  Pharmacological myocardial perfusion imaging study with ischemia in the inferior wall Mild fixed perfusion defect inferior wall, moderate reversible defect also noted inferior wall and apical region moderate Normal wall motion, EF estimated at 43% No EKG changes concerning for ischemia at peak stress or in recovery. CT attenuation correction images with aortic atherosclerosis, three-vessel coronary calcification Moderate risk scan     Signed, Esmond Plants, MD, Ph.D [facility]      EKG:  EKG is  ordered today.  The ekg ordered today demonstrates NSR, 61bpm, PVCs, RBBB, NO significant ST/T wave changes  Recent  Labs: 05/16/2021: ALT 19; BUN 15; Creatinine, Ser 1.37; Hemoglobin 11.4; Platelets 238; Potassium 4.8; Sodium 141  Recent Lipid Panel    Component Value Date/Time   CHOL 114 05/16/2021 0923   TRIG 104 05/16/2021 0923   TRIG 136 02/28/2008 0000   HDL 35 (L) 05/16/2021 0923   CHOLHDL 3.3 05/16/2021 0923   LDLCALC 59 05/16/2021 0923    Physical Exam:    VS:  BP (!) 190/60 (BP Location: Left Arm, Patient Position: Sitting, Cuff Size: Normal)    Pulse 61    Ht 5' 9.5" (1.765 m)    Wt 169 lb (76.7 kg)    SpO2 98%    BMI 24.60 kg/m     Wt Readings from Last 3 Encounters:  09/19/21 169 lb (76.7 kg)  09/06/21 167 lb (75.8 kg)  08/26/21 174 lb 3.2 oz (79 kg)     GEN:  Well nourished, well developed in no acute distress HEENT: Normal NECK: No JVD; No carotid bruits LYMPHATICS: No lymphadenopathy CARDIAC: RRR, no murmurs, rubs, gallops RESPIRATORY:  Clear to auscultation without rales, wheezing or rhonchi  ABDOMEN: Soft, non-tender, non-distended MUSCULOSKELETAL:  No edema; No deformity  SKIN: Warm and dry NEUROLOGIC:  Alert and oriented x 3 PSYCHIATRIC:  Normal affect   ASSESSMENT:    1. Stable angina ([place])   2. Coronary artery disease of native artery of native heart with stable angina pectoris ([place])   3. Chronic systolic heart failure ([address])   4. Essential hypertension   5. Hyperlipidemia, mixed   6. CKD (chronic kidney disease), stage II   7. Abnormal stress test    PLAN:    In order of problems listed above:  Chest pain  Abnormal Myoview Lexiscan CAD s/p remote cABG Recent MPI showed ischemia in the inferior and apical wall, with EF 43%. Patient is still having exertional chest pain and shortness of breath. Cardiac catheterization discussed and patient is willing to pursue. Continue Aspirin, statin, Coreg, Imdur. Further recommendations per cath. Monitor kidney function post-cath.  Risks and benefits of cardiac catheterization have been discussed with the patient.  These  include bleeding, infection, kidney damage, stroke, heart attack, death.  The patient understands these risks and is willing to proceed.   Cardiomyopathy  EF 43% HFmrEF EF estimated 43 % on recent MPI. 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 Plan for LHC as above, can evaluate EF at cath. Continue Coreg, lisinopril and [place]. Continue GDMT as able at follow-up.   HTN BP very high today, but he has not had his medications yet today. Recommended he go home and take BP medications. Continue Coreg 6.84m BID, Imdur 936mdialy, lisinopril 20104maily.  CKD stage 3 Most recent Scr/BUN 1.37/15. Check BMET today prior to cath.   DM2 Most recent A1C 7.4. Management per PCP.   HLD LDL 59, Continue Lipitor 57m52mily.   Disposition: Follow up  post-cath  with MD/APP     Signed, Jejuan Scala H FuNinfa Meeker-C  09/19/2021 9:03 AM    [facility]

## 2021-09-19 ENCOUNTER — Other Ambulatory Visit: Payer: Self-pay

## 2021-09-19 ENCOUNTER — Ambulatory Visit: Payer: Medicare PPO | Admitting: Medical

## 2021-09-19 ENCOUNTER — Encounter: Payer: Self-pay | Admitting: Medical

## 2021-09-19 VITALS — BP 190/60 | HR 61 | Ht 69.5 in | Wt 169.0 lb

## 2021-09-19 DIAGNOSIS — N182 Chronic kidney disease, stage 2 (mild): Secondary | ICD-10-CM

## 2021-09-19 DIAGNOSIS — I5022 Chronic systolic (congestive) heart failure: Secondary | ICD-10-CM | POA: Diagnosis not present

## 2021-09-19 DIAGNOSIS — E782 Mixed hyperlipidemia: Secondary | ICD-10-CM | POA: Diagnosis not present

## 2021-09-19 DIAGNOSIS — I208 Other forms of angina pectoris: Secondary | ICD-10-CM

## 2021-09-19 DIAGNOSIS — I10 Essential (primary) hypertension: Secondary | ICD-10-CM

## 2021-09-19 DIAGNOSIS — I25118 Atherosclerotic heart disease of native coronary artery with other forms of angina pectoris: Secondary | ICD-10-CM

## 2021-09-19 DIAGNOSIS — R9439 Abnormal result of other cardiovascular function study: Secondary | ICD-10-CM

## 2021-09-19 MED ORDER — SODIUM CHLORIDE 0.9% FLUSH: 3.0000 mL | Freq: Two times a day (BID) | INTRAVENOUS | Status: AC

## 2021-09-19 NOTE — Patient Instructions (Signed)
Medication Instructions:  No changes at this time.  *If you need a refill on your cardiac medications before your next appointment, please call your pharmacy*   Lab Work: CBC & BMET today  If you have labs (blood work) drawn today and your tests are completely normal, you will receive your results only by: MyChart Message (if you have MyChart) OR A paper copy in the mail If you have any lab test that is abnormal or we need to change your treatment, we will call you to review the results.   Testing/Procedures: [facility] Cardiac Cath Instructions  You are scheduled for a Cardiac Cath on: 09/24/21 Please arrive at 08:30 am on the day of your procedure Please expect a call from our [facility] to pre-register you Do not eat/drink anything after midnight Someone will need to drive you home It is recommended someone be with you for the first 24 hours after your procedure Wear clothes that are easy to get on/off and wear slip on shoes if possible   Medications bring a current list of all medications with you  _XX_ You may take all of your medications the morning of your procedure with enough water to swallow safely  _XX__ Do not take these medications before your procedure: Furosemide the morning of procedure. Also hold Metformin the day of and night before.      Day of your procedure: Arrive at the [facility] entrance.  Free valet service is available.  After entering the [facility] please check-in at the registration desk (1st desk on your right) to receive your armband. After receiving your armband someone will escort you to the cardiac cath/special procedures waiting area.  The usual length of stay after your procedure is about 2 to 3 hours.  This can vary.  If you have any questions, please call our office at [phone], or you may call the cardiac cath lab at [facility] directly at [phone]    Follow-Up: At [facility], you and your health needs are our  priority.  As part of our continuing mission to provide you with exceptional heart care, we have created designated Provider Care Teams.  These Care Teams include your primary Cardiologist (physician) and Advanced Practice Providers (APPs -  Physician Assistants and Nurse Practitioners) who all work together to provide you with the care you need, when you need it.  We recommend signing up for the patient portal called "MyChart".  Sign up information is provided on this After Visit Summary.  MyChart is used to connect with patients for Virtual Visits (Telemedicine).  Patients are able to view lab/test results, encounter notes, upcoming appointments, etc.  Non-urgent messages can be sent to your provider as well.   To learn more about what you can do with MyChart, go to [email or web address].    Your next appointment:   2 week(s)  The format for your next appointment:   In Person  Provider:   Julien Nordmann, MD or Cadence Fransico Michael, [place]

## 2021-09-20 ENCOUNTER — Telehealth: Payer: Self-pay | Admitting: Emergency Medicine

## 2021-09-20 LAB — BASIC METABOLIC PANEL
BUN/Creatinine Ratio: 10 (ref 10–24)
BUN: 12 mg/dL (ref 8–27)
CO2: 23 mmol/L (ref 20–29)
Calcium: 9.4 mg/dL (ref 8.6–10.2)
Chloride: 104 mmol/L (ref 96–106)
Creatinine, Ser: 1.17 mg/dL (ref 0.76–1.27)
Glucose: 123 mg/dL — ABNORMAL HIGH (ref 70–99)
Potassium: 4.8 mmol/L (ref 3.5–5.2)
Sodium: 140 mmol/L (ref 134–144)
eGFR: 63 mL/min/{1.73_m2} (ref >59)

## 2021-09-20 LAB — CBC
Hematocrit: 33.7 % — ABNORMAL LOW (ref 37.5–51.0)
Hemoglobin: 10.8 g/dL — ABNORMAL LOW (ref 13.0–17.7)
MCH: 24.6 pg — ABNORMAL LOW (ref 26.6–33.0)
MCHC: 32 g/dL (ref 31.5–35.7)
MCV: 77 fL — ABNORMAL LOW (ref 79–97)
Platelets: 211 10*3/uL (ref 150–450)
RBC: 4.39 x10E6/uL (ref 4.14–5.80)
RDW: 15.1 % (ref 11.6–15.4)
WBC: 6 10*3/uL (ref 3.4–10.8)

## 2021-09-20 NOTE — Telephone Encounter (Signed)
-----   Message from Cadence David Stall, [name] sent at 09/20/2021  9:51 AM EST ----- Labs stable

## 2021-09-20 NOTE — Telephone Encounter (Signed)
Called patient, no answer, left detailed message per DPR.

## 2021-09-20 NOTE — Telephone Encounter (Signed)
Patient calling to discuss recent testing results  ° °Please call  ° °

## 2021-09-20 NOTE — Telephone Encounter (Signed)
Spoke with patient. All questions answered. Pt voiced appreciation for call.

## 2021-09-24 ENCOUNTER — Encounter: Payer: Self-pay | Admitting: Internal Medicine

## 2021-09-24 ENCOUNTER — Ambulatory Visit
Admission: RE | Admit: 2021-09-24 | Discharge: 2021-09-24 | Disposition: A | Discharge status: Home / Self Care | Payer: Medicare PPO | Attending: Internal Medicine | Admitting: Internal Medicine

## 2021-09-24 ENCOUNTER — Other Ambulatory Visit: Payer: Self-pay

## 2021-09-24 ENCOUNTER — Encounter
Admission: RE | Disposition: A | Discharge status: Home / Self Care | Payer: Self-pay | Source: Home / Self Care | Attending: Internal Medicine

## 2021-09-24 DIAGNOSIS — I130 Hypertensive heart and chronic kidney disease with heart failure and stage 1 through stage 4 chronic kidney disease, or unspecified chronic kidney disease: Secondary | ICD-10-CM | POA: Insufficient documentation

## 2021-09-24 DIAGNOSIS — R079 Chest pain, unspecified: Secondary | ICD-10-CM

## 2021-09-24 DIAGNOSIS — I5032 Chronic diastolic (congestive) heart failure: Secondary | ICD-10-CM | POA: Insufficient documentation

## 2021-09-24 DIAGNOSIS — E1140 Type 2 diabetes mellitus with diabetic neuropathy, unspecified: Secondary | ICD-10-CM | POA: Insufficient documentation

## 2021-09-24 DIAGNOSIS — N183 Chronic kidney disease, stage 3 unspecified: Secondary | ICD-10-CM | POA: Insufficient documentation

## 2021-09-24 DIAGNOSIS — F17290 Nicotine dependence, other tobacco product, uncomplicated: Secondary | ICD-10-CM | POA: Diagnosis not present

## 2021-09-24 DIAGNOSIS — Z79899 Other long term (current) drug therapy: Secondary | ICD-10-CM | POA: Diagnosis not present

## 2021-09-24 DIAGNOSIS — I25708 Atherosclerosis of coronary artery bypass graft(s), unspecified, with other forms of angina pectoris: Secondary | ICD-10-CM | POA: Insufficient documentation

## 2021-09-24 DIAGNOSIS — I2582 Chronic total occlusion of coronary artery: Secondary | ICD-10-CM | POA: Insufficient documentation

## 2021-09-24 DIAGNOSIS — I200 Unstable angina: Secondary | ICD-10-CM | POA: Diagnosis present

## 2021-09-24 DIAGNOSIS — I10 Essential (primary) hypertension: Secondary | ICD-10-CM

## 2021-09-24 DIAGNOSIS — E782 Mixed hyperlipidemia: Secondary | ICD-10-CM | POA: Diagnosis not present

## 2021-09-24 DIAGNOSIS — I25110 Atherosclerotic heart disease of native coronary artery with unstable angina pectoris: Secondary | ICD-10-CM

## 2021-09-24 DIAGNOSIS — I25700 Atherosclerosis of coronary artery bypass graft(s), unspecified, with unstable angina pectoris: Secondary | ICD-10-CM | POA: Diagnosis not present

## 2021-09-24 DIAGNOSIS — Z7984 Long term (current) use of oral hypoglycemic drugs: Secondary | ICD-10-CM | POA: Insufficient documentation

## 2021-09-24 DIAGNOSIS — I208 Other forms of angina pectoris: Secondary | ICD-10-CM

## 2021-09-24 DIAGNOSIS — E1122 Type 2 diabetes mellitus with diabetic chronic kidney disease: Secondary | ICD-10-CM | POA: Insufficient documentation

## 2021-09-24 DIAGNOSIS — R9439 Abnormal result of other cardiovascular function study: Secondary | ICD-10-CM | POA: Diagnosis present

## 2021-09-24 DIAGNOSIS — E119 Type 2 diabetes mellitus without complications: Secondary | ICD-10-CM

## 2021-09-24 HISTORY — PX: CORONARY STENT INTERVENTION: CATH118234

## 2021-09-24 HISTORY — PX: LEFT HEART CATH AND CORONARY ANGIOGRAPHY: CATH118249

## 2021-09-24 LAB — GLUCOSE, CAPILLARY
Glucose-Capillary: 156 mg/dL — ABNORMAL HIGH (ref 70–99)
Glucose-Capillary: 136 mg/dL — ABNORMAL HIGH (ref 70–99)

## 2021-09-24 LAB — POCT ACTIVATED CLOTTING TIME
Activated Clotting Time: 227 seconds
Activated Clotting Time: 257 seconds

## 2021-09-24 SURGERY — LEFT HEART CATH AND CORONARY ANGIOGRAPHY
Anesthesia: Moderate Sedation

## 2021-09-24 MED ORDER — SODIUM CHLORIDE 0.9 % IV SOLN: 250.0000 mL | INTRAVENOUS | Status: DC | PRN

## 2021-09-24 MED ORDER — ONDANSETRON HCL 4 MG/2ML IJ SOLN: 4.0000 mg | Freq: Four times a day (QID) | INTRAMUSCULAR | Status: DC | PRN

## 2021-09-24 MED ORDER — ASPIRIN 81 MG PO CHEW
CHEWABLE_TABLET | ORAL | Status: AC
  Filled 2021-09-24: qty 1

## 2021-09-24 MED ORDER — LABETALOL HCL 5 MG/ML IV SOLN: 10.0000 mg | INTRAVENOUS | Status: DC | PRN

## 2021-09-24 MED ORDER — CLOPIDOGREL BISULFATE 75 MG PO TABS
ORAL_TABLET | ORAL | Status: AC
  Filled 2021-09-24: qty 8

## 2021-09-24 MED ORDER — ASPIRIN 81 MG PO CHEW: 81.0000 mg | CHEWABLE_TABLET | Freq: Every day | ORAL | Status: DC

## 2021-09-24 MED ORDER — HEPARIN SODIUM (PORCINE) 1000 UNIT/ML IJ SOLN
INTRAMUSCULAR | Status: AC
Start: 2021-09-24 — End: ?
  Filled 2021-09-24: qty 10

## 2021-09-24 MED ORDER — CLOPIDOGREL BISULFATE 75 MG PO TABS
ORAL_TABLET | ORAL | Status: DC | PRN
  Administered 2021-09-24: 600 mg via ORAL

## 2021-09-24 MED ORDER — HYDRALAZINE HCL 20 MG/ML IJ SOLN: 10.0000 mg | INTRAMUSCULAR | Status: DC | PRN

## 2021-09-24 MED ORDER — CLOPIDOGREL BISULFATE 75 MG PO TABS: 75.0000 mg | ORAL_TABLET | Freq: Every day | ORAL | Status: DC

## 2021-09-24 MED ORDER — MIDAZOLAM HCL 2 MG/2ML IJ SOLN
INTRAMUSCULAR | Status: DC | PRN
  Administered 2021-09-24: 1 mg via INTRAVENOUS

## 2021-09-24 MED ORDER — FENTANYL CITRATE (PF) 100 MCG/2ML IJ SOLN
INTRAMUSCULAR | Status: AC
  Filled 2021-09-24: qty 2

## 2021-09-24 MED ORDER — HEPARIN (PORCINE) IN NACL 1000-0.9 UT/500ML-% IV SOLN
INTRAVENOUS | Status: AC
  Filled 2021-09-24: qty 1000

## 2021-09-24 MED ORDER — HYDRALAZINE HCL 20 MG/ML IJ SOLN
INTRAMUSCULAR | Status: DC | PRN
  Administered 2021-09-24: 10 mg via INTRAVENOUS

## 2021-09-24 MED ORDER — FENTANYL CITRATE (PF) 100 MCG/2ML IJ SOLN
INTRAMUSCULAR | Status: DC | PRN
  Administered 2021-09-24: 25 ug via INTRAVENOUS

## 2021-09-24 MED ORDER — ACETAMINOPHEN 325 MG PO TABS: 650.0000 mg | ORAL_TABLET | ORAL | Status: DC | PRN

## 2021-09-24 MED ORDER — HEPARIN SODIUM (PORCINE) 1000 UNIT/ML IJ SOLN
INTRAMUSCULAR | Status: DC | PRN
  Administered 2021-09-24 (×2): 4000 [IU] via INTRAVENOUS
  Administered 2021-09-24: 3000 [IU] via INTRAVENOUS

## 2021-09-24 MED ORDER — VERAPAMIL HCL 2.5 MG/ML IV SOLN
INTRAVENOUS | Status: DC | PRN
  Administered 2021-09-24: 2.5 mg via INTRA_ARTERIAL

## 2021-09-24 MED ORDER — SODIUM CHLORIDE 0.9% FLUSH: 3.0000 mL | INTRAVENOUS | Status: DC | PRN

## 2021-09-24 MED ORDER — LIDOCAINE HCL (PF) 1 % IJ SOLN: INTRAMUSCULAR | Status: DC | PRN

## 2021-09-24 MED ORDER — MIDAZOLAM HCL 2 MG/2ML IJ SOLN
INTRAMUSCULAR | Status: AC
  Filled 2021-09-24: qty 2

## 2021-09-24 MED ORDER — ASPIRIN 81 MG PO CHEW
81.0000 mg | CHEWABLE_TABLET | ORAL | Status: AC
  Administered 2021-09-24: 81 mg via ORAL

## 2021-09-24 MED ORDER — VERAPAMIL HCL 2.5 MG/ML IV SOLN
INTRAVENOUS | Status: AC
  Filled 2021-09-24: qty 2

## 2021-09-24 MED ORDER — METFORMIN HCL 1000 MG PO TABS: ORAL_TABLET | ORAL | Status: DC

## 2021-09-24 MED ORDER — HEPARIN (PORCINE) IN NACL 2000-0.9 UNIT/L-% IV SOLN
INTRAVENOUS | Status: DC | PRN
Start: 2021-09-24 — End: 2021-09-24
  Administered 2021-09-24: 1000 mL

## 2021-09-24 MED ORDER — ISOSORBIDE MONONITRATE ER 120 MG PO TB24: 120.0000 mg | ORAL_TABLET | Freq: Every day | ORAL | 11 refills | Status: DC

## 2021-09-24 MED ORDER — HYDRALAZINE HCL 20 MG/ML IJ SOLN
INTRAMUSCULAR | Status: AC
  Filled 2021-09-24: qty 1

## 2021-09-24 MED ORDER — SODIUM CHLORIDE 0.9 % WEIGHT BASED INFUSION
1.0000 mL/kg/h | INTRAVENOUS | Status: DC
  Administered 2021-09-24: 1 mL/kg/h via INTRAVENOUS

## 2021-09-24 MED ORDER — SODIUM CHLORIDE 0.9 % WEIGHT BASED INFUSION
3.0000 mL/kg/h | INTRAVENOUS | Status: AC
  Administered 2021-09-24: 3 mL/kg/h via INTRAVENOUS

## 2021-09-24 MED ORDER — IOHEXOL 300 MG/ML  SOLN
INTRAMUSCULAR | Status: DC | PRN
  Administered 2021-09-24: 100 mL

## 2021-09-24 MED ORDER — HEPARIN SODIUM (PORCINE) 1000 UNIT/ML IJ SOLN
INTRAMUSCULAR | Status: AC
  Filled 2021-09-24: qty 10

## 2021-09-24 MED ORDER — SODIUM CHLORIDE 0.9% FLUSH: 3.0000 mL | Freq: Two times a day (BID) | INTRAVENOUS | Status: DC

## 2021-09-24 MED ORDER — SODIUM CHLORIDE 0.9 % IV SOLN: INTRAVENOUS | Status: DC

## 2021-09-24 SURGICAL SUPPLY — 19 items
BALLN EUPHORA RX 2.0X12 (BALLOONS) ×3
BALLOON EUPHORA RX 2.0X12 (BALLOONS) IMPLANT
CATH INFINITI 5 FR IM (CATHETERS) ×1 IMPLANT
CATH INFINITI 5 FR MPA2 (CATHETERS) ×1 IMPLANT
CATH VISTA GUIDE 6FR JR4 (CATHETERS) ×1 IMPLANT
DEVICE RAD TR BAND REGULAR (VASCULAR PRODUCTS) ×1 IMPLANT
DRAPE BRACHIAL (DRAPES) ×1 IMPLANT
GLIDESHEATH SLEND SS 6F .021 (SHEATH) ×1 IMPLANT
GUIDEWIRE INQWIRE 1.5J.035X260 (WIRE) IMPLANT
INQWIRE 1.5J .035X260CM (WIRE) ×3
KIT ENCORE 26 ADVANTAGE (KITS) ×1 IMPLANT
KIT SYRINGE INJ CVI SPIKEX1 (MISCELLANEOUS) ×1 IMPLANT
PACK CARDIAC CATH (CUSTOM PROCEDURE TRAY) ×4 IMPLANT
PROTECTION STATION PRESSURIZED (MISCELLANEOUS) ×3
SET ATX SIMPLICITY (MISCELLANEOUS) ×1 IMPLANT
STATION PROTECTION PRESSURIZED (MISCELLANEOUS) IMPLANT
TUBING CIL FLEX 10 FLL-RA (TUBING) ×1 IMPLANT
WIRE ASAHI FIELDER XT 300CM (WIRE) ×1 IMPLANT
WIRE RUNTHROUGH .014X180CM (WIRE) ×1 IMPLANT

## 2021-09-24 NOTE — Brief Op Note (Signed)
BRIEF CARDIAC CATHETERIZATION NOTE  09/24/2021  11:17 AM  PATIENT:  [name] Gross Sr.  81 y.o. male  PRE-OPERATIVE DIAGNOSIS: Accelerating angina and abnormal stress test  POST-OPERATIVE DIAGNOSIS: Same  PROCEDURE:  Procedure(s): LEFT HEART CATH AND CORONARY ANGIOGRAPHY (Left) CORONARY STENT INTERVENTION (N/A)  SURGEON:  Surgeon(s) and Role:    * Ikesha Siller, MD - Primary  FINDINGS: Severe three-vessel coronary artery disease including diffuse proximal LAD and LCx disease as well as chronic subtotal occlusion of the mid RCA. Patent LIMA-LAD and sequential SVG-OM1-OM2. Chronically occluded SVG-diagonal. Normal LVEF and LVEDP. Unsuccessful attempted PCI to mid RCA with failure to cross the lesion using run-through and Fielder XT wires.  RECOMMENDATIONS: Escalate antianginal therapy; I will increase isosorbide mononitrate to 120 mg daily. If patient has persistent lifestyle limiting angina, consider consultation with CTO team in [facility] for consideration of PCI to mid RCA. Aggressive secondary prevention of CAD.  [name], MD [facility]

## 2021-09-24 NOTE — Interval H&P Note (Signed)
History and Physical Interval Note:  09/24/2021 9:15 AM  [name] Alpha Sr.  has presented today for surgery, with the diagnosis of accelerating angina and abnormal stress test.  The various methods of treatment have been discussed with the patient and family. After consideration of risks, benefits and other options for treatment, the patient has consented to  Procedure(s): LEFT HEART CATH AND CORONARY ANGIOGRAPHY (Left) as a surgical intervention.  The patient's history has been reviewed, patient examined, no change in status, stable for surgery.  I have reviewed the patient's chart and labs.  Questions were answered to the patient's satisfaction.    Cath Lab Visit (complete for each Cath Lab visit)  Clinical Evaluation Leading to the Procedure:   ACS: No.  Non-ACS:    Anginal Classification: CCS II  Anti-ischemic medical therapy: Maximal Therapy (2 or more classes of medications)  Non-Invasive Test Results: Intermediate-risk stress test findings: cardiac mortality 1-3%/year  Prior CABG: Previous CABG  [name]

## 2021-09-25 ENCOUNTER — Encounter: Payer: Self-pay | Admitting: Internal Medicine

## 2021-10-01 ENCOUNTER — Other Ambulatory Visit: Payer: Self-pay | Admitting: Cardiovascular Disease

## 2021-10-01 ENCOUNTER — Telehealth: Payer: Self-pay | Admitting: Physician Assistant

## 2021-10-01 ENCOUNTER — Other Ambulatory Visit: Payer: Self-pay | Admitting: Family Medicine

## 2021-10-01 DIAGNOSIS — R3911 Hesitancy of micturition: Secondary | ICD-10-CM

## 2021-10-01 DIAGNOSIS — N401 Enlarged prostate with lower urinary tract symptoms: Secondary | ICD-10-CM

## 2021-10-01 DIAGNOSIS — M2550 Pain in unspecified joint: Secondary | ICD-10-CM

## 2021-10-01 NOTE — Telephone Encounter (Signed)
Copied from CRM [id number]. Topic: Medicare AWV ?>> Oct 01, 2021  1:52 PM Claudette Laws R wrote: ?Reason for CRM:  ?Left message for patient to call back and schedule Medicare Annual Wellness Visit (AWV) in office.  ? ?If not able to come in office, please offer to do virtually or by telephone.  ? ?Last AWV:  04/23/2020 ? ?Please schedule at anytime with [facility] Health Advisor. ? ?If any questions, please contact me at [phone] ?

## 2021-10-02 ENCOUNTER — Other Ambulatory Visit: Payer: Self-pay | Admitting: Physician Assistant

## 2021-10-02 DIAGNOSIS — E118 Type 2 diabetes mellitus with unspecified complications: Secondary | ICD-10-CM

## 2021-10-02 NOTE — Telephone Encounter (Signed)
Requested Prescriptions  ?Pending Prescriptions Disp Refills  ?? meloxicam (MOBIC) 7.5 MG tablet [Pharmacy Med Name: MELOXICAM 7.5 MG TAB] 180 tablet 0  ?  Sig: TAKE 1 TABLET BY MOUTH TWICE DAILY  ?  ? Analgesics:  COX2 Inhibitors Failed - 10/01/2021  4:53 PM  ?  ?  Failed - Manual Review: Labs are only required if the patient has taken medication for more than 8 weeks.  ?  ?  Failed - HGB in normal range and within 360 days  ?  Hemoglobin  ?Date Value Ref Range Status  ?09/19/2021 10.8 (L) 13.0 - 17.7 g/dL Final  ?   ?  ?  Failed - HCT in normal range and within 360 days  ?  Hematocrit  ?Date Value Ref Range Status  ?09/19/2021 33.7 (L) 37.5 - 51.0 % Final  ?   ?  ?  Passed - Cr in normal range and within 360 days  ?  Creat  ?Date Value Ref Range Status  ?06/12/2017 1.43 (H) 0.70 - 1.18 mg/dL Final  ?  Comment:  ?  For patients >70 years of age, the reference limit ?for Creatinine is approximately 13% higher for people ?identified as African-American. ?. ?  ? ?Creatinine, Ser  ?Date Value Ref Range Status  ?09/19/2021 1.17 0.76 - 1.27 mg/dL Final  ?   ?  ?  Passed - AST in normal range and within 360 days  ?  AST  ?Date Value Ref Range Status  ?05/16/2021 16 0 - 40 IU/L Final  ?   ?  ?  Passed - ALT in normal range and within 360 days  ?  ALT  ?Date Value Ref Range Status  ?05/16/2021 19 0 - 44 IU/L Final  ?   ?  ?  Passed - eGFR is 30 or above and within 360 days  ?  GFR, Est African American  ?Date Value Ref Range Status  ?06/12/2017 55 (L) > OR = 60 mL/min/1.75m Final  ? ?GFR calc Af Amer  ?Date Value Ref Range Status  ?04/23/2020 66 >59 mL/min/1.73 Final  ?  Comment:  ?  **[facility] currently reports eGFR in compliance with the current** ?  recommendations of the [employer] [facility] will ?  update reporting as new guidelines are published from the NKF-ASN ?  Task force. ?  ? ?GFR, Est Non African American  ?Date Value Ref Range Status  ?06/12/2017 47 (L) > OR = 60 mL/min/1.799mFinal  ? ?GFR calc  non Af Amer  ?Date Value Ref Range Status  ?04/23/2020 57 (L) >59 mL/min/1.73 Final  ? ?eGFR  ?Date Value Ref Range Status  ?09/19/2021 63 >59 mL/min/1.73 Final  ?   ?  ?  Passed - Patient is not pregnant  ?  ?  Passed - Valid encounter within last 12 months  ?  Recent Outpatient Visits   ?      ? 1 month ago Type 2 diabetes mellitus with complication, without long-term current use of insulin ([facility] ? [facility] M[facility] CommentPA-[facility]  ? 8 months ago Essential hypertension  ? [facility]  ? 11 months ago Type 2 diabetes mellitus with stage 2 chronic kidney disease, without long-term current use of insulin ([facility] ? [place]? 1 year ago Type 2 diabetes mellitus with stage 2 chronic kidney disease, without long-term current use of insulin ([facility]CAda ? [facility] Harrison, Clearnce Sorrel, [name]  ? 1 year ago Type 2 diabetes mellitus with stage 2 chronic kidney disease, without long-term current use of insulin ([place])  ? [place], [place]  ?  ?  ?Future Appointments   ?        ? In 1 week Furth, [employer], [name] [place]  ? In 1 month Drubel, Ria Comment, [name] [facility], [facility]  ?  ? ?  ?  ?  ?? tamsulosin (FLOMAX) 0.4 MG CAPS capsule [Pharmacy Med Name: TAMSULOSIN HCL 0.4 MG CAP] 30 capsule 0  ?  Sig: TAKE 1 CAPSULE BY MOUTH ONCE DAILY. NEEDOFFICE VIIST FOR REFILLS.  ?  ? Urology: Alpha-Adrenergic Blocker Failed - 10/01/2021  4:53 PM  ?  ?  Failed - PSA in normal range and within 360 days  ?  Prostate Specific Ag, Serum  ?Date Value Ref Range Status  ?03/19/2017 3.7 0.0 - 4.0 ng/mL Final  ?  Comment:  ?  Roche ECLIA methodology. ?According to the American Urological Association, Serum PSA should ?decrease and remain at undetectable levels after radical ?prostatectomy. The AUA defines biochemical recurrence as an initial ?PSA value 0.2 ng/mL or  greater followed by a subsequent confirmatory ?PSA value 0.2 ng/mL or greater. ?Values obtained with different assay methods or kits cannot be used ?interchangeably. Results cannot be interpreted as absolute evidence ?of the presence or absence of malignant disease. ?  ?   ?  ?  Failed - Last BP in normal range  ?  BP Readings from Last 1 Encounters:  ?09/24/21 (!) 143/72  ?   ?  ?  Passed - Valid encounter within last 12 months  ?  Recent Outpatient Visits   ?      ? 1 month ago Type 2 diabetes mellitus with complication, without long-term current use of insulin ([place])  ? [facility] Thedore Mins, Ria Comment, [name]  ? 8 months ago Essential hypertension  ? [facility] Birdie Sons, MD  ? 11 months ago Type 2 diabetes mellitus with stage 2 chronic kidney disease, without long-term current use of insulin ([place])  ? [place], [place]  ? 1 year ago Type 2 diabetes mellitus with stage 2 chronic kidney disease, without long-term current use of insulin ([place])  ? [place], [place]  ? 1 year ago Type 2 diabetes mellitus with stage 2 chronic kidney disease, without long-term current use of insulin ([place])  ? [place], [place]  ?  ?  ?Future Appointments   ?        ? In 1 week Furth, [employer], [name] [place]  ? In 1 month Drubel, Delman Cheadle [facility], [facility]  ?  ? ?  ?  ?  ? ?

## 2021-10-02 NOTE — Telephone Encounter (Signed)
Requested medications are due for refill today.  yes ? ?Requested medications are on the active medications list.  yes ? ?Last refill. 05/13/2021 #30 0 refills ? ?Future visit scheduled.   yes ? ?Notes to clinic.  Failed refill protocol d/t expired labs. ? ? ? ?Requested Prescriptions  ?Pending Prescriptions Disp Refills  ? tamsulosin (FLOMAX) 0.4 MG CAPS capsule [Pharmacy Med Name: TAMSULOSIN HCL 0.4 MG CAP] 30 capsule 0  ?  Sig: TAKE 1 CAPSULE BY MOUTH ONCE DAILY. NEEDOFFICE VIIST FOR REFILLS.  ?  ? Urology: Alpha-Adrenergic Blocker Failed - 10/01/2021  4:53 PM  ?  ?  Failed - PSA in normal range and within 360 days  ?  Prostate Specific Ag, Serum  ?Date Value Ref Range Status  ?03/19/2017 3.7 0.0 - 4.0 ng/mL Final  ?  Comment:  ?  Roche ECLIA methodology. ?According to the American Urological Association, Serum PSA should ?decrease and remain at undetectable levels after radical ?prostatectomy. The AUA defines biochemical recurrence as an initial ?PSA value 0.2 ng/mL or greater followed by a subsequent confirmatory ?PSA value 0.2 ng/mL or greater. ?Values obtained with different assay methods or kits cannot be used ?interchangeably. Results cannot be interpreted as absolute evidence ?of the presence or absence of malignant disease. ?  ?  ?  ?  ?  Failed - Last BP in normal range  ?  BP Readings from Last 1 Encounters:  ?09/24/21 (!) 143/72  ?  ?  ?  ?  Passed - Valid encounter within last 12 months  ?  Recent Outpatient Visits   ? ?      ? 1 month ago Type 2 diabetes mellitus with complication, without long-term current use of insulin ([place])  ? [facility] Thedore Mins, Ria Comment, [name]  ? 8 months ago Essential hypertension  ? [facility] Birdie Sons, MD  ? 11 months ago Type 2 diabetes mellitus with stage 2 chronic kidney disease, without long-term current use of insulin ([place])  ? [place], [place]  ? 1 year ago Type 2 diabetes mellitus with stage 2  chronic kidney disease, without long-term current use of insulin ([place])  ? [place], [place]  ? 1 year ago Type 2 diabetes mellitus with stage 2 chronic kidney disease, without long-term current use of insulin ([place])  ? [place], [place]  ? ?  ?  ?Future Appointments   ? ?        ? In 1 week Furth, [employer], [name] [place]  ? In 1 month Drubel, Delman Cheadle [facility], [facility]  ? ?  ? ?  ?  ?  ?Signed Prescriptions Disp Refills  ? meloxicam (MOBIC) 7.5 MG tablet 180 tablet 0  ?  Sig: TAKE 1 TABLET BY MOUTH TWICE DAILY  ?  ? Analgesics:  COX2 Inhibitors Failed - 10/01/2021  4:53 PM  ?  ?  Failed - Manual Review: Labs are only required if the patient has taken medication for more than 8 weeks.  ?  ?  Failed - HGB in normal range and within 360 days  ?  Hemoglobin  ?Date Value Ref Range Status  ?09/19/2021 10.8 (L) 13.0 - 17.7 g/dL Final  ?  ?  ?  ?  Failed - HCT in normal range and within 360 days  ?  Hematocrit  ?Date Value Ref Range Status  ?09/19/2021 33.7 (L) 37.5 -  51.0 % Final  ?  ?  ?  ?  Passed - Cr in normal range and within 360 days  ?  Creat  ?Date Value Ref Range Status  ?06/12/2017 1.43 (H) 0.70 - 1.18 mg/dL Final  ?  Comment:  ?  For patients >43 years of age, the reference limit ?for Creatinine is approximately 13% higher for people ?identified as African-American. ?. ?  ? ?Creatinine, Ser  ?Date Value Ref Range Status  ?09/19/2021 1.17 0.76 - 1.27 mg/dL Final  ?  ?  ?  ?  Passed - AST in normal range and within 360 days  ?  AST  ?Date Value Ref Range Status  ?05/16/2021 16 0 - 40 IU/L Final  ?  ?  ?  ?  Passed - ALT in normal range and within 360 days  ?  ALT  ?Date Value Ref Range Status  ?05/16/2021 19 0 - 44 IU/L Final  ?  ?  ?  ?  Passed - eGFR is 30 or above and within 360 days  ?  GFR, Est African American  ?Date Value Ref Range Status  ?06/12/2017 55 (L) > OR = 60 mL/min/1.48m Final   ? ?GFR calc Af Amer  ?Date Value Ref Range Status  ?04/23/2020 66 >59 mL/min/1.73 Final  ?  Comment:  ?  **[facility] currently reports eGFR in compliance with the current** ?  recommendations of the [employer] [facility] will ?  update reporting as new guidelines are published from the NKF-ASN ?  Task force. ?  ? ?GFR, Est Non African American  ?Date Value Ref Range Status  ?06/12/2017 47 (L) > OR = 60 mL/min/1.775mFinal  ? ?GFR calc non Af Amer  ?Date Value Ref Range Status  ?04/23/2020 57 (L) >59 mL/min/1.73 Final  ? ?eGFR  ?Date Value Ref Range Status  ?09/19/2021 63 >59 mL/min/1.73 Final  ?  ?  ?  ?  Passed - Patient is not pregnant  ?  ?  Passed - Valid encounter within last 12 months  ?  Recent Outpatient Visits   ? ?      ? 1 month ago Type 2 diabetes mellitus with complication, without long-term current use of insulin (HC[facility] ? [facility] MinsLiRia CommentPA-C  ? 8 months ago Essential hypertension  ? [facility] Sons[facility]  ? 11 months ago Type 2 diabetes mellitus with stage 2 chronic kidney disease, without long-term current use of insulin (HCK[place] ? [place]? 1 year ago Type 2 diabetes mellitus with stage 2 chronic kidney disease, without long-term current use of insulin (HCWhite[place] ? [place]? 1 year ago Type 2 diabetes mellitus with stage 2 chronic kidney disease, without long-term current use of insulin (HCG[place] ? Bu[place]? ?  ?  ?Future Appointments   ? ?        ? In 1 week Furth, CaCrown HoldingsPA-C [facility]  ? In 1 month Drubel, LiDelman CheadleuJackson County [facility] HospitalPEC  ? ?  ? ?  ?  ?  ?  ?

## 2021-10-09 ENCOUNTER — Other Ambulatory Visit: Payer: Self-pay

## 2021-10-09 ENCOUNTER — Ambulatory Visit: Payer: Medicare PPO | Admitting: Medical

## 2021-10-09 ENCOUNTER — Encounter: Payer: Self-pay | Admitting: Medical

## 2021-10-09 VITALS — BP 148/80 | HR 69 | Ht 69.5 in | Wt 171.2 lb

## 2021-10-09 DIAGNOSIS — I25118 Atherosclerotic heart disease of native coronary artery with other forms of angina pectoris: Secondary | ICD-10-CM

## 2021-10-09 DIAGNOSIS — I2582 Chronic total occlusion of coronary artery: Secondary | ICD-10-CM

## 2021-10-09 DIAGNOSIS — E782 Mixed hyperlipidemia: Secondary | ICD-10-CM

## 2021-10-09 DIAGNOSIS — I10 Essential (primary) hypertension: Secondary | ICD-10-CM | POA: Diagnosis not present

## 2021-10-09 DIAGNOSIS — N182 Chronic kidney disease, stage 2 (mild): Secondary | ICD-10-CM | POA: Diagnosis not present

## 2021-10-09 DIAGNOSIS — I5022 Chronic systolic (congestive) heart failure: Secondary | ICD-10-CM

## 2021-10-09 DIAGNOSIS — E118 Type 2 diabetes mellitus with unspecified complications: Secondary | ICD-10-CM

## 2021-10-09 MED ORDER — RANOLAZINE ER 500 MG PO TB12: 500.0000 mg | ORAL_TABLET | Freq: Two times a day (BID) | ORAL | 6 refills | Status: DC

## 2021-10-09 NOTE — Progress Notes (Signed)
?Cardiology Office Note:   ? ?Date:  10/09/2021  ? ?ID:  [name] Gross Sr., DOB August 03, 1940, MRN [id number] ? ?PCP:  [name], [name]  ?[facility] Cardiologist:  [name], MD  ?[place] Electrophysiologist:  None  ? ?Referring MD: [name], [name]  ? ?Chief Complaint: post cath f/u ? ?History of Present Illness:   ? ?[name] Lower Homer Sr. is a 81 y.o. male with a hx of  with a hx of  with a hx of CAD s/p CABG (2004), HFimppEF, hypertension, hyperlipidemia, CKD 3, DM 2 who presents for follow-up cardiac cath.   ?  ?H/o CAD s/p CABG x4 in 2004 with subsequent catheterization in 2007 revealing 3 and 4 patent grafts (occluded vein graft to the diagonal). Recent medication changes include addition of Cialis and discontinuation of Imdur (given interaction between these two mediations). Coreg has also been discontinued with patient previously on Coreg 6.59m BID but no longer taking for unclear reasons at this time.  ?  ?Myoview 07/2019 was abnormal, high risk test with possible ischemia, LVEF 43%.  ?Echo 07/2019 showed LVEF 50-55%, G2DD, WMA, mildly dilated LA, moderately elevated pulmonary artery pressure.    ?  ?Seen 05/16/21 and reported chest pain. Imdur was increased to 90 mg daily.  ?  ?Seen 09/06/21 and reported chest pain and was still having exertional chest pain. A myoview lexiscan was ordered, this showed ischemia in the inferior wall with perfusion defect, moderate reversible in the inferior wall and apical region, EF 43%, moderate risk scan. Patient was brought in to discuss cardiac catheterization. HE was seen 09/19/21 and was set up for outpatient cardiac catheterization.  ? ?Cath showed multivessel coronary artery disease including severe diffuse LAD and left circumflex disease.  There is also a short, chronic total occlusion of the mid RCA with bridging with left-to-right collaterals.  Widely patent LIMA to LAD and sequential SVG to OM to OM 2.  Chronically occluded SVG  to D1.  Normal LVEF with normal filling pressures.  Unsuccessful attempted PCI to mid RCA due to inability to cross the lesion using run-through and Fielder XT wires.  Recommendation for escalation of antianginal therapy, Imdur was increased to 120 mg daily.  If the patient has refractory lifestyle limiting symptoms may need referral to CTO team. ? ?Today, the patient has been doing well since the cath. He has occasional chest pain, had pain yesterday while working outside. Breathing is short at times. No LLE, orthopnea, pnd. Disicsused the cath and Mds recommendations. He is wanting to see [place] in [place]  ? ? ?Past Medical History:  ?Diagnosis Date  ? Arthritis   ? Back pain   ? hx  ? CAD (coronary artery disease)   ? a. 2004 s/p CABG x 4 (LIMA->LAD, VG->Diag, VG->OM, VG->RCA); b. 2007 Cath: VG->Diag occluded, otw 3/4 patent grafts.  ? CKD (chronic kidney disease), stage II-III   ? Colon polyps   ? DM2 (diabetes mellitus, type 2) ([place]   ? stable  ? GERD (gastroesophageal reflux disease)   ? Glaucoma   ? Gout   ? HLD (hyperlipidemia)   ? HTN (hypertension)   ? Neuropathy   ? ? ?Past Surgical History:  ?Procedure Laterality Date  ? BACK SURGERY    ? Cyst removed  ? CORONARY ARTERY BYPASS GRAFT  2004  ? CORONARY STENT INTERVENTION N/A 09/24/2021  ? Procedure: CORONARY STENT INTERVENTION;  Surgeon: ENelva Bush MD;  [facility];  Service: Cardiovascular;  Laterality: N/A;  ? KNEE SURGERY    ? LEFT HEART CATH AND CORONARY ANGIOGRAPHY Left 09/24/2021  ? Procedure: LEFT HEART CATH AND CORONARY ANGIOGRAPHY;  Surgeon: [name], MD;  Location: [facility];  Service: Cardiovascular;  Laterality: Left;  ? ? ?Current Medications: ?Current Meds  ?Medication Sig  ? ACCU-CHEK AVIVA PLUS test strip USE AS DIRECTED TO CHECK FASTING BLOOD SUGAR EVERY MORNING  ? allopurinol (ZYLOPRIM) 300 MG tablet TAKE 1 TABLET BY MOUTH ONCE DAILY  ? aspirin 81 MG chewable tablet Chew 81 mg by  mouth daily.  ? atorvastatin (LIPITOR) 20 MG tablet TAKE 1 TABLET BY MOUTH ONCE DAILY  ? BD SHARPS CONTAINER HOME MISC Dispose of syringes after B12 injections  ? Blood Glucose Monitoring Suppl (ACCU-CHEK AVIVA PLUS) w/Device KIT To check blood sugar once daily  ? carvedilol (COREG) 6.25 MG tablet TAKE 1 TABLET BY MOUTH TWICE A DAY  ? dapagliflozin propanediol (FARXIGA) 10 MG TABS tablet Take 1 tablet (10 mg total) by mouth daily. Please schedule office visit before any future refills.  ? furosemide (LASIX) 20 MG tablet TAKE 1 TABLET BY MOUTH AS DIRECTED. TAKE1 TABLET TWICE DAILY 3 DAYS PER WEEK AND1 TABLET ONCE DAILY ON OTHER DAYS.  ? gabapentin (NEURONTIN) 400 MG capsule TAKE 1 CAPSULE BY MOUTH 3 TIMES A DAY  ? glipiZIDE (GLUCOTROL XL) 10 MG 24 hr tablet Take 1 tablet (10 mg total) by mouth daily with breakfast.  ? isosorbide mononitrate (IMDUR) 120 MG 24 hr tablet Take 1 tablet (120 mg total) by mouth daily.  ? JANUVIA 50 MG tablet TAKE 1 TABLET BY MOUTH ONCE A DAY  ? lisinopril (ZESTRIL) 20 MG tablet TAKE 1 TABLET BY MOUTH ONCE DAILY  ? meloxicam (MOBIC) 7.5 MG tablet TAKE 1 TABLET BY MOUTH TWICE DAILY  ? metFORMIN (GLUCOPHAGE) 1000 MG tablet TAKE 1 TABLET BY MOUTH TWICE (2) DAILY WITH A MEAL  ? nitroGLYCERIN (NITROSTAT) 0.4 MG SL tablet Place 1 tablet (0.4 mg total) under the tongue every 5 (five) minutes as needed for chest pain.  ? potassium chloride (KLOR-CON) 10 MEQ tablet TAKE 1 TABLET BY MOUTH ONCE DAILY  ? ranolazine (RANEXA) 500 MG 12 hr tablet Take 1 tablet (500 mg total) by mouth 2 (two) times daily.  ? SYRINGE-NEEDLE, DISP, 3 ML (B-D SYRINGE/NEEDLE 3CC/25GX5/8) 25G X 5/8" 3 ML MISC For use with cyanocobalamin injections  ? tamsulosin (FLOMAX) 0.4 MG CAPS capsule Take 1 capsule (0.4 mg total) by mouth daily.  ? ?Current Facility-Administered Medications for the 10/09/21 encounter (Office Visit) with Kathlen Mody, Stacyann Mcconaughy H, [name]  ?Medication  ? sodium chloride flush (NS) 0.9 % injection 3 mL  ?  ? ?Allergies:    Ramipril  ? ?Social History  ? ?Socioeconomic History  ? Marital status: Widowed  ?  Spouse name: Not on file  ? Number of children: 3  ? Years of education: Not on file  ? Highest education level: 8th grade  ?Occupational History  ? Not on file  ?Tobacco Use  ? Smoking status: Never  ? Smokeless tobacco: Current  ?  Types: Chew  ? Tobacco comments:  ?  chew tobacco - 1 pack last 3 weeks  ?Vaping Use  ? Vaping Use: Never used  ?Substance and Sexual Activity  ? Alcohol use: No  ?  Alcohol/week: 0.0 standard drinks  ? Drug use: No  ? Sexual activity: Not Currently  ?Other Topics Concern  ? Not on file  ?Social History Narrative  ?  Not on file  ? ?Social Determinants of Health  ? ?Financial Resource Strain: Not on file  ?Food Insecurity: Not on file  ?Transportation Needs: Not on file  ?Physical Activity: Not on file  ?Stress: Not on file  ?Social Connections: Not on file  ?  ? ?Family History: ?The patient's Family history is unknown by patient. ? ?ROS:   ?Please see the history of present illness.    ? All other systems reviewed and are negative. ? ?EKGs/Labs/Other Studies Reviewed:   ? ?The following studies were reviewed today: ? ?Cardiac cath 09/24/21 ? ?Conclusions: ?Multivessel coronary artery disease including severe diffuse LAD and LCx disease.  There is also a short, chronic total occlusion of the mid RCA with bridging and left-right collaterals. ?Widely patent LIMA-LAD and sequential SVG-OM1-OM2. ?Chronically occluded SVG-D1. ?Normal left ventricular contraction (LVEF 55-65%) with normal filling pressure (LVEDP 10 mmHg). ?Unsuccessful attempted PCI to mid RCA due to inability to cross the lesion using Runthrough and Fielder XT wires (including with balloon support). ?  ?Recommendations: ?Escalate antianginal therapy.  I will increase isosorbide mononitrate to 120 mg daily.  If the patient has refractory lifestyle-limiting angina despite maximal tolerated antianginal therapy, referral to our CTO team for  possible repeat intervention to the mid RCA could be considered. ?Aggressive secondary prevention of coronary artery disease. ?  ?[name], MD ?[place] ?  ?Antiplatelet/Anticoag Continue asp

## 2021-10-09 NOTE — Patient Instructions (Addendum)
Medication Instructions:  ?- Your physician has recommended you make the following change in your medication:  ? ?1) START Ranexa (ranolazine) 500 mg: ?- take 1 tablet by mouth TWICE daily (or every 12 hours) ? ?*If you need a refill on your cardiac medications before your next appointment, please call your pharmacy* ? ? ?Lab Work: ?- none ordered ? ?If you have labs (blood work) drawn today and your tests are completely normal, you will receive your results only by: ?MyChart Message (if you have MyChart) OR ?A paper copy in the mail ?If you have any lab test that is abnormal or we need to change your treatment, we will call you to review the results. ? ? ?Testing/Procedures: ?- You have been referred to: Dr. Theron AristaPeter Jordan/ Dr. Everette RankJay Varanasi (in the [place] office) to discuss possible CTO procedure. ? ? ? ?Follow-Up: ?At [facility], you and your health needs are our priority.  As part of our continuing mission to provide you with exceptional heart care, we have created designated Provider Care Teams.  These Care Teams include your primary Cardiologist (physician) and Advanced Practice Providers (APPs -  Physician Assistants and Nurse Practitioners) who all work together to provide you with the care you need, when you need it. ? ?We recommend signing up for the patient portal called "MyChart".  Sign up information is provided on this After Visit Summary.  MyChart is used to connect with patients for Virtual Visits (Telemedicine).  Patients are able to view lab/test results, encounter notes, upcoming appointments, etc.  Non-urgent messages can be sent to your provider as well.   ?To learn more about what you can do with MyChart, go to [email or web address].   ? ?Your next appointment:   ?1 month(s) ? ?The format for your next appointment:   ?In Person ? ?Provider:   ?You may see Julien Nordmannimothy Gollan, MD or one of the following Advanced Practice Providers on your designated Care Team:   ? ?Cadence Fransico MichaelFurth, [name]   ? ? ?Other Instructions ? ?Ranolazine Extended-Release Tablets ?What is this medication? ?RANOLAZINE (ra NOE la zeen) is a heart medication. It is used to treat chronic chest pain (angina). It will not relieve an acute episode of chest pain. ?This medicine may be used for other purposes; ask your health care provider or pharmacist if you have questions. ?COMMON BRAND NAME(S): Ranexa ?What should I tell my care team before I take this medication? ?They need to know if you have any of these conditions: ?Heart disease ?Irregular heartbeat or rhythm ?Kidney disease ?Liver disease ?Low levels of potassium or magnesium in the blood ?An unusual or allergic reaction to ranolazine, other medications, foods, dyes, or preservatives ?Pregnant or trying to get pregnant ?Breast-feeding ?How should I use this medication? ?Take this medication by mouth with water. Take it as directed on the prescription label. Do not cut, crush or chew this medication. Swallow the tablets whole. You may take it with or without food. Do not take it with grapefruit juice. Keep taking it unless your care team tells you to stop. ?Talk to your care team about the use of this medication in children. Special care may be needed. ?Overdosage: If you think you have taken too much of this medicine contact a poison control center or emergency room at once. ?NOTE: This medicine is only for you. Do not share this medicine with others. ?What if I miss a dose? ?If you miss a dose, take it as soon as you can. If it  is almost time for your next dose, take only that dose. Do not take double or extra doses. ?What may interact with this medication? ?Do not take this medicine with any of the following medications: ?Cerivastatin ?Certain antibiotics like clarithromycin, rifabutin, rifampin, rifapentine ?Certain antivirals for hepatitis or HIV ?Certain medicines used for cancer treatment ?Certain medicines for fungal infections like itraconazole, ketoconazole,  posaconazole, voriconazole ?Certain medicines for irregular heart beat like dronedarone ?Certain medicines for seizures like carbamazepine, fosphenytoin, oxcarbazepine, phenobarbital, phenytoin ?Cisapride ?Conivaptan ?Nefazodone ?Pimozide ?St. John's wort ?Thioridazine ?This medicine may also interact with the following medications: ?Certain medicines for depression, anxiety, or psychotic disturbances ?Certain medicines for cholesterol like atorvastatin, lovastatin, simvastatin ?Cyclosporine ?Digoxin ?Diltiazem ?Dofetilide ?Eplerenone ?Ergot alkaloids like dihydroergotamine, ergonovine, ergotamine, methylergonovine ?Erythromycin ?Fluconazole ?Grapefruit or grapefruit juice ?Metformin ?Other medicines that prolong the QT interval (which can cause an abnormal heart rhythm) ?Sirolimus ?Tacrolimus ?Verapamil ?This list may not describe all possible interactions. Give your health care provider a list of all the medicines, herbs, non-prescription drugs, or dietary supplements you use. Also tell them if you smoke, drink alcohol, or use illegal drugs. Some items may interact with your medicine. ?What should I watch for while using this medication? ?Visit your care team for regular checks on your progress. Tell your care team if your symptoms do not start to get better or if they get worse. This medication will not relieve an acute attack of angina or chest pain. ?You may get drowsy or dizzy. Do not drive, use machinery, or do anything that needs mental alertness until you know how this medication affects you. Do not stand up or sit up quickly, especially if you are an older patient. This reduces the risk of dizzy or fainting spells. Alcohol may interfere with the effects of this medication. Avoid alcoholic drinks. ?If you are going to need surgery or other procedure, tell your care team that you are using this medication. ?What side effects may I notice from receiving this medication? ?Side effects that you should report to  your care team as soon as possible: ?Allergic reactions--skin rash, itching, hives, swelling of the face, lips, tongue, or throat ?Burning or tingling sensation in hands or feet ?Change in vision ?Heart rhythm changes-- fast or irregular heartbeat, dizziness, feeling faint or lightheaded, chest pain, trouble breathing ?Low blood pressure--dizziness, feeling faint or lightheaded, blurry vision ?Ringing in ears ?Slow heartbeat--dizziness, feeling faint or lightheaded, confusion, trouble breathing, unusual weakness or fatigue ?Swelling of the ankles, hands, or feet ?Tremors or shaking ?Side effects that usually do not require medical attention (report to your care team if they continue or are bothersome): ?Constipation ?Dizziness ?Headache ?Nausea ?Upset Stomach ?This list may not describe all possible side effects. Call your doctor for medical advice about side effects. You may report side effects to FDA at 1-800-FDA-1088. ?Where should I keep my medication? ?Keep out of the reach of children and pets. ?Store at room temperature at 25 degrees C (77 degrees F). Get rid of any unused medication after the expiration date. ?To get rid of medications that are no longer needed or have expired: ?Take the medication to a medication take-back program. Check with your pharmacy or law enforcement to find a location. ?If you cannot return the medication, check the label or package insert to see if the medication should be thrown out in the garbage or flushed down the toilet. If you are not sure, ask your care team. If it is safe to put it in the trash,  empty the medication out of the container. Mix the medication with cat litter, dirt, coffee grounds, or other unwanted substance. Seal the mixture in a bag or container. Put it in the trash. ?NOTE: This sheet is a summary. It may not cover all possible information. If you have questions about this medicine, talk to your doctor, pharmacist, or health care provider. ?? 2022  Elsevier/Gold Standard (2020-10-05 00:00:00) ? ? ?

## 2021-10-23 ENCOUNTER — Other Ambulatory Visit: Payer: Self-pay | Admitting: Cardiovascular Disease

## 2021-10-23 ENCOUNTER — Institutional Professional Consult (permissible substitution): Payer: Medicare PPO | Admitting: Interventional Cardiology

## 2021-10-23 NOTE — Progress Notes (Deleted)
?  ?Cardiology Office Note ? ? ?Date:  10/23/2021  ? ?ID:  [name] Gross Sr., DOB [birth year], MRN [id number] ? ?PCP:  [name], [name]  ? ? ?No chief complaint on file. ? ?CAD ? ?Wt Readings from Last 3 Encounters:  ?10/09/21 171 lb 4 oz (77.7 kg)  ?09/24/21 170 lb (77.1 kg)  ?09/19/21 169 lb (76.7 kg)  ?  ? ?  ?History of Present Illness: ?[name] Lower Heiss Sr. is a 81 y.o. male with a history of CAD.  Most recent cardiac cath was done in February 2023.  He had a chronically occluded RCA with no patent bypass graft to that territory.  RCA intervention was attempted but unsuccessful. ? ? ? ?Past Medical History:  ?Diagnosis Date  ? Arthritis   ? Back pain   ? hx  ? CAD (coronary artery disease)   ? a. 2004 s/p CABG x 4 (LIMA->LAD, VG->Diag, VG->OM, VG->RCA); b. 2007 Cath: VG->Diag occluded, otw 3/4 patent grafts.  ? CKD (chronic kidney disease), stage II-III   ? Colon polyps   ? DM2 (diabetes mellitus, type 2) ([place])   ? stable  ? GERD (gastroesophageal reflux disease)   ? Glaucoma   ? Gout   ? HLD (hyperlipidemia)   ? HTN (hypertension)   ? Neuropathy   ? ? ?Past Surgical History:  ?Procedure Laterality Date  ? BACK SURGERY    ? Cyst removed  ? CORONARY ARTERY BYPASS GRAFT  2004  ? CORONARY STENT INTERVENTION N/A 09/24/2021  ? Procedure: CORONARY STENT INTERVENTION;  Surgeon: [name], MD;  Location: [facility];  Service: Cardiovascular;  Laterality: N/A;  ? KNEE SURGERY    ? LEFT HEART CATH AND CORONARY ANGIOGRAPHY Left 09/24/2021  ? Procedure: LEFT HEART CATH AND CORONARY ANGIOGRAPHY;  Surgeon: [name], MD;  Location: [facility];  Service: Cardiovascular;  Laterality: Left;  ? ? ? ?Current Outpatient Medications  ?Medication Sig Dispense Refill  ? ACCU-CHEK AVIVA PLUS test strip USE AS DIRECTED TO CHECK FASTING BLOOD SUGAR EVERY MORNING 100 each 4  ? allopurinol (ZYLOPRIM) 300 MG tablet TAKE 1 TABLET BY MOUTH ONCE DAILY 90 tablet 0  ? aspirin 81 MG chewable  tablet Chew 81 mg by mouth daily.    ? atorvastatin (LIPITOR) 20 MG tablet TAKE 1 TABLET BY MOUTH ONCE DAILY 90 tablet 1  ? BD SHARPS CONTAINER HOME MISC Dispose of syringes after B12 injections 1 each 5  ? Blood Glucose Monitoring Suppl (ACCU-CHEK AVIVA PLUS) w/Device KIT To check blood sugar once daily 1 kit 0  ? carvedilol (COREG) 6.25 MG tablet TAKE 1 TABLET BY MOUTH TWICE A DAY 180 tablet 0  ? dapagliflozin propanediol (FARXIGA) 10 MG TABS tablet Take 1 tablet (10 mg total) by mouth daily. Please schedule office visit before any future refills. 90 tablet 3  ? furosemide (LASIX) 20 MG tablet TAKE 1 TABLET BY MOUTH AS DIRECTED. TAKE1 TABLET TWICE DAILY 3 DAYS PER WEEK AND1 TABLET ONCE DAILY ON OTHER DAYS. 120 tablet 2  ? gabapentin (NEURONTIN) 400 MG capsule TAKE 1 CAPSULE BY MOUTH 3 TIMES A DAY 90 capsule 5  ? glipiZIDE (GLUCOTROL XL) 10 MG 24 hr tablet Take 1 tablet (10 mg total) by mouth daily with breakfast. 90 tablet 3  ? isosorbide mononitrate (IMDUR) 120 MG 24 hr tablet Take 1 tablet (120 mg total) by mouth daily. 30 tablet 11  ? JANUVIA 50 MG tablet TAKE 1 TABLET BY MOUTH ONCE A  DAY 90 tablet 3  ? lisinopril (ZESTRIL) 20 MG tablet TAKE 1 TABLET BY MOUTH ONCE DAILY 30 tablet 0  ? meloxicam (MOBIC) 7.5 MG tablet TAKE 1 TABLET BY MOUTH TWICE DAILY 180 tablet 0  ? metFORMIN (GLUCOPHAGE) 1000 MG tablet TAKE 1 TABLET BY MOUTH TWICE (2) DAILY WITH A MEAL    ? nitroGLYCERIN (NITROSTAT) 0.4 MG SL tablet Place 1 tablet (0.4 mg total) under the tongue every 5 (five) minutes as needed for chest pain. 25 tablet 6  ? potassium chloride (KLOR-CON) 10 MEQ tablet TAKE 1 TABLET BY MOUTH ONCE DAILY 90 tablet 1  ? ranolazine (RANEXA) 500 MG 12 hr tablet Take 1 tablet (500 mg total) by mouth 2 (two) times daily. 60 tablet 6  ? SYRINGE-NEEDLE, DISP, 3 ML (B-D SYRINGE/NEEDLE 3CC/25GX5/8) 25G X 5/8" 3 ML MISC For use with cyanocobalamin injections 50 each 0  ? tamsulosin (FLOMAX) 0.4 MG CAPS capsule Take 1 capsule (0.4 mg total)  by mouth daily. 30 capsule 0  ? ?Current Facility-Administered Medications  ?Medication Dose Route Frequency Provider Last Rate Last Admin  ? sodium chloride flush (NS) 0.9 % injection 3 mL  3 mL Intravenous Q12H Furth, Cadence H, [name]      ? ? ?Allergies:   Ramipril  ? ? ?Social History:  The patient  reports that he has never smoked. His smokeless tobacco use includes chew. He reports that he does not drink alcohol and does not use drugs.  ? ?Family History:  The patient's ***Family history is unknown by patient.  ? ? ?ROS:  Please see the history of present illness.   Otherwise, review of systems are positive for ***.   All other systems are reviewed and negative.  ? ? ?PHYSICAL EXAM: ?VS:  There were no vitals taken for this visit. , BMI There is no height or weight on file to calculate BMI. ?GEN: Well nourished, well developed, in no acute distress ?HEENT: normal ?Neck: no JVD, carotid bruits, or masses ?Cardiac: ***RRR; no murmurs, rubs, or gallops,no edema  ?Respiratory:  clear to auscultation bilaterally, normal work of breathing ?GI: soft, nontender, nondistended, + BS ?MS: no deformity or atrophy ?Skin: warm and dry, no rash ?Neuro:  Strength and sensation are intact ?Psych: euthymic mood, full affect ? ? ?EKG:   ?The ekg ordered today demonstrates *** ? ? ?Recent Labs: ?05/16/2021: ALT 19 ?09/19/2021: BUN 12; Creatinine, Ser 1.17; Hemoglobin 10.8; Platelets 211; Potassium 4.8; Sodium 140  ? ?Lipid Panel ?   ?Component Value Date/Time  ? CHOL 114 05/16/2021 0923  ? TRIG 104 05/16/2021 0923  ? TRIG 136 02/28/2008 0000  ? HDL 35 (L) 05/16/2021 7121  ? CHOLHDL 3.3 05/16/2021 0923  ? [place] 59 05/16/2021 0923  ? ?  ?Other studies Reviewed: ?Additional studies/ records that were reviewed today with results demonstrating: I personally reviewed the cath films.  Short occlusion of the mid RCA with moderate calcification.  Attempts with softer wires to cross were unsuccessful. ? ? ?ASSESSMENT AND PLAN: ? ?CAD:  Medical therapy at this time for RCA disease. ?*** ?*** ? ? ?Current medicines are reviewed at length with the patient today.  The patient concerns regarding his medicines were addressed. ? ?The following changes have been made:  No change*** ? ?Labs/ tests ordered today include: *** ?No orders of the defined types were placed in this encounter. ? ? ?Recommend 150 minutes/week of aerobic exercise ?Low fat, low carb, high fiber diet recommended ? ?Disposition:   FU  in *** ? ? ?Signed, ?Larae Grooms, MD  ?10/23/2021 12:35 PM    ?[facility] ?[address], [place], [place]  [address] ?Phone: [phone]; Fax: [phone]  ? ?

## 2021-10-24 ENCOUNTER — Other Ambulatory Visit: Payer: Self-pay | Admitting: Physician Assistant

## 2021-10-24 DIAGNOSIS — E119 Type 2 diabetes mellitus without complications: Secondary | ICD-10-CM

## 2021-10-31 ENCOUNTER — Other Ambulatory Visit: Payer: Self-pay | Admitting: Family Medicine

## 2021-11-12 NOTE — Progress Notes (Signed)
Cardiology Office Note ? ?Date:  11/18/2021  ? ?ID:  [name] Gross Sr., DOB 21-Sep-1940, MRN [id number] ? ?PCP:  [name], [name]  ? ?Chief Complaint  ?Patient presents with  ? 1 month follow up   ?  Patient c/o chest pain at times. Medications reviewed by the patient verbally.   ?  ? ?HPI:  ?[name] is a very pleasant 81 year old gentleman with  ?coronary artery disease,  ?bypass surgery in 2004,  ?occluded graft to the diagonal branch with 3 other patent grafts to the RCA, OM with a LIMA to the LAD by catheterization in 2007  ?who presents for routine followup Of his coronary artery disease .  ? ?Last seen in clinic September 06, 2021 ?On that visit was active, chopping wood, denied shortness of breath but continued to report chest pain ? ?Cardiac catheterization 12/22/2021 for angina ?Multivessel coronary artery disease including severe diffuse LAD and LCx disease.  There is also a short, chronic total occlusion of the mid RCA with bridging and left-right collaterals. ?Widely patent LIMA-LAD and sequential SVG-OM1-OM2. ?Chronically occluded SVG-D1. ?Normal left ventricular contraction (LVEF 55-65%) with normal filling pressure (LVEDP 10 mmHg). ?Unsuccessful attempted PCI to mid RCA due to inability to cross the lesion using Runthrough and Fielder XT wires (including with balloon support). ?  ?Medication changes made ? isosorbide mononitrate to 120 mg daily.  ?Recommendation if symptoms persisted, referral be made to CTO team for possible repeat intervention to the mid RCA  ? ?Was seen by one of our providers October 09, 2021, reported having chest pain and was referred to [facility] clinic ?Reports he was unaware of the appointment and was a no-show, thought it was the end of April not March ? 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 ?In general feels he is not having much chest pain, symptoms are rare ?Feels they are better with higher dose isosorbide ? ?Did not take his blood pressure medications this morning, did not have breakfast ? ?EKG personally reviewed by myself on todays visit ?Normal sinus rhythm rate 64 bpm left bundle branch block ? ?Labs reviewed ?A1C 7.4 ?Total chol 114 ? ?Past medical history reviewed ? 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 creatinine 1.46 hemoglobin 11 glucose 268 ? ?Echocardiogram February 01, 2020 ?ejection fraction 50 to 55%, ?Grade 2 diastolic dysfunction, right heart pressures 45 mmHg, hypokinesis basal inferoseptal wall ? ?Stress test August 02, 2019 ?Ischemia in the basal and mid inferior and inferoseptal myocardium ? ? ?PMH:   has a past medical history of Arthritis, Back pain, CAD (coronary artery disease), CKD (chronic kidney disease), stage II-III, Colon polyps, DM2 (diabetes mellitus, type 2) ([place]), GERD (gastroesophageal reflux disease), Glaucoma, Gout, HLD (hyperlipidemia), HTN (hypertension), and Neuropathy. ? ?PSH:    ?Past Surgical History:  ?Procedure Laterality Date  ? BACK SURGERY    ? Cyst removed  ?  CORONARY ARTERY BYPASS GRAFT  2004  ? CORONARY STENT INTERVENTION N/A 09/24/2021  ? Procedure: CORONARY STENT INTERVENTION;  Surgeon: [name], MD;  Location: [place] [facility];  Service: Cardiovascular;  Laterality: N/A;  ? KNEE SURGERY    ? LEFT HEART CATH AND CORONARY ANGIOGRAPHY Left 09/24/2021  ? Procedure: LEFT HEART CATH AND  CORONARY ANGIOGRAPHY;  Surgeon: [name], MD;  Location: [place] [facility];  Service: Cardiovascular;  Laterality: Left;  ? ? ?Current Outpatient Medications  ?Medication Sig Dispense Refill  ? ACCU-CHEK AVIVA PLUS test strip USE AS DIRECTED TO CHECK FASTING BLOOD SUGAR EVERY MORNING 100 each 4  ? allopurinol (ZYLOPRIM) 300 MG tablet TAKE 1 TABLET BY MOUTH ONCE DAILY 90 tablet 0  ? aspirin 81 MG chewable tablet Chew 81 mg by mouth daily.    ? atorvastatin (LIPITOR) 20 MG tablet TAKE 1 TABLET BY MOUTH ONCE DAILY 90 tablet 1  ? BD SHARPS CONTAINER HOME MISC Dispose of syringes after B12 injections 1 each 5  ? Blood Glucose Monitoring Suppl (ACCU-CHEK AVIVA PLUS) w/Device KIT To check blood sugar once daily 1 kit 0  ? carvedilol (COREG) 6.25 MG tablet TAKE 1 TABLET BY MOUTH TWICE A DAY 180 tablet 0  ? dapagliflozin propanediol (FARXIGA) 10 MG TABS tablet Take 1 tablet (10 mg total) by mouth daily. Please schedule office visit before any future refills. 90 tablet 3  ? furosemide (LASIX) 20 MG tablet TAKE 1 TABLET BY MOUTH AS DIRECTED. TAKE1 TABLET TWICE DAILY 3 DAYS PER WEEK AND1 TABLET ONCE DAILY ON OTHER DAYS. 120 tablet 2  ? gabapentin (NEURONTIN) 400 MG capsule TAKE 1 CAPSULE BY MOUTH 3 TIMES A DAY 90 capsule 5  ? glipiZIDE (GLUCOTROL XL) 10 MG 24 hr tablet Take 1 tablet (10 mg total) by mouth daily with breakfast. 90 tablet 3  ? isosorbide mononitrate (IMDUR) 120 MG 24 hr tablet Take 1 tablet (120 mg total) by mouth daily. 30 tablet 11  ? JANUVIA 50 MG tablet TAKE 1 TABLET BY MOUTH ONCE A DAY 90 tablet 3  ? lisinopril (ZESTRIL) 20 MG tablet TAKE 1 TABLET BY MOUTH ONCE DAILY 30 tablet 0  ? meloxicam (MOBIC) 7.5 MG tablet TAKE 1 TABLET BY MOUTH TWICE DAILY 180 tablet 0  ? metFORMIN (GLUCOPHAGE) 1000 MG tablet TAKE 1 TABLET BY MOUTH TWICE A DAY WITH A MEAL 180 tablet 1  ? nitroGLYCERIN (NITROSTAT) 0.4 MG SL tablet Place 1 tablet (0.4 mg total) under the tongue every 5 (five) minutes as needed for chest  pain. 25 tablet 6  ? potassium chloride (KLOR-CON) 10 MEQ tablet TAKE 1 TABLET BY MOUTH ONCE DAILY 90 tablet 1  ? ranolazine (RANEXA) 500 MG 12 hr tablet Take 1 tablet (500 mg total) by mouth 2 (two) times daily. 60 tablet 6  ? SYRINGE-NEEDLE, DISP, 3 ML (B-D SYRINGE/NEEDLE 3CC/25GX5/8) 25G X 5/8" 3 ML MISC For use with cyanocobalamin injections 50 each 0  ? tamsulosin (FLOMAX) 0.4 MG CAPS capsule Take 1 capsule (0.4 mg total) by mouth daily. 30 capsule 0  ? ?Current Facility-Administered Medications  ?Medication Dose Route Frequency Provider Last Rate Last Admin  ? sodium chloride flush (NS) 0.9 % injection 3 mL  3 mL Intravenous Q12H Furth, Cadence H, [name]      ? ? ? ?Allergies:   Ramipril  ? ?Social History:  The patient  reports that he has never smoked. His smokeless tobacco use includes chew. He reports that he does not drink  alcohol and does not use drugs.  ? ?Family History:   Family history is unknown by patient.  ? ? ?Review of Systems: ?Review of Systems  ?Constitutional: Negative.   ?HENT: Negative.    ?Respiratory: Negative.    ?Cardiovascular:  Positive for chest pain.  ?Gastrointestinal: Negative.   ?Musculoskeletal: Negative.   ?Neurological: Negative.   ?Psychiatric/Behavioral: Negative.    ?All other systems reviewed and are negative. ? ?PHYSICAL EXAM: ?VS:  BP (!) 162/72 (BP Location: Left Arm, Patient Position: Sitting, Cuff Size: Normal)   Pulse 64   Ht '5\' 10"'  (1.778 m)   Wt 171 lb (77.6 kg)   SpO2 98%   BMI 24.54 kg/m?  , BMI Body mass index is 24.54 kg/m?Marland Kitchen  ?Constitutional:  oriented to person, place, and time. No distress.  ?HENT:  ?Head: Grossly normal ?Eyes:  no discharge. No scleral icterus.  ?Neck: No JVD, no carotid bruits  ?Cardiovascular: Regular rate and rhythm, no murmurs appreciated ?Pulmonary/Chest: Clear to auscultation bilaterally, no wheezes or rails ?Abdominal: Soft.  no distension.  no tenderness.  ?Musculoskeletal: Normal range of motion ?Neurological:  normal muscle  tone. Coordination normal. No atrophy ?Skin: Skin warm and dry ?Psychiatric: normal affect, pleasant ? ?Recent Labs: ?05/16/2021: ALT 19 ?09/19/2021: BUN 12; Creatinine, Ser 1.17; Hemoglobin 10.8; Platelets 211; [place]

## 2021-11-18 ENCOUNTER — Ambulatory Visit (INDEPENDENT_AMBULATORY_CARE_PROVIDER_SITE_OTHER): Payer: Medicare PPO | Admitting: Cardiovascular Disease

## 2021-11-18 ENCOUNTER — Encounter: Payer: Self-pay | Admitting: Cardiovascular Disease

## 2021-11-18 VITALS — BP 162/72 | HR 64 | Ht 70.0 in | Wt 171.0 lb

## 2021-11-18 DIAGNOSIS — N182 Chronic kidney disease, stage 2 (mild): Secondary | ICD-10-CM

## 2021-11-18 DIAGNOSIS — I2582 Chronic total occlusion of coronary artery: Secondary | ICD-10-CM | POA: Diagnosis not present

## 2021-11-18 DIAGNOSIS — E782 Mixed hyperlipidemia: Secondary | ICD-10-CM

## 2021-11-18 DIAGNOSIS — E1159 Type 2 diabetes mellitus with other circulatory complications: Secondary | ICD-10-CM

## 2021-11-18 DIAGNOSIS — E118 Type 2 diabetes mellitus with unspecified complications: Secondary | ICD-10-CM

## 2021-11-18 DIAGNOSIS — I10 Essential (primary) hypertension: Secondary | ICD-10-CM | POA: Diagnosis not present

## 2021-11-18 DIAGNOSIS — E119 Type 2 diabetes mellitus without complications: Secondary | ICD-10-CM

## 2021-11-18 DIAGNOSIS — I5022 Chronic systolic (congestive) heart failure: Secondary | ICD-10-CM

## 2021-11-18 DIAGNOSIS — I152 Hypertension secondary to endocrine disorders: Secondary | ICD-10-CM

## 2021-11-18 DIAGNOSIS — I25118 Atherosclerotic heart disease of native coronary artery with other forms of angina pectoris: Secondary | ICD-10-CM | POA: Diagnosis not present

## 2021-11-18 MED ORDER — CARVEDILOL 6.25 MG PO TABS: 6.2500 mg | ORAL_TABLET | Freq: Two times a day (BID) | ORAL | 0 refills | Status: DC

## 2021-11-18 NOTE — Patient Instructions (Signed)
Medication Instructions:  No changes  If you need a refill on your cardiac medications before your next appointment, please call your pharmacy.    Lab work: No new labs needed   Testing/Procedures: No new testing needed   Follow-Up: At [facility], you and your health needs are our priority.  As part of our continuing mission to provide you with exceptional heart care, we have created designated Provider Care Teams.  These Care Teams include your primary Cardiologist (physician) and Advanced Practice Providers (APPs -  Physician Assistants and Nurse Practitioners) who all work together to provide you with the care you need, when you need it.  You will need a follow up appointment in 3 months  Providers on your designated Care Team:   Christopher Berge, NP Ryan Dunn, [name] Cadence Furth, [name]  COVID-19 Vaccine Information can be found at: [email or web address] For questions related to vaccine distribution or appointments, please email [email or web address] or call [phone].   

## 2021-11-21 NOTE — Progress Notes (Signed)
? ? ? ?I,Sha'taria Tyson,acting as a Education administrator for [employer], [name].,have documented all relevant documentation on the behalf of [name] Kirschner, [name],as directed by  [name] Kirschner, [name] while in the presence of [name] Kirschner, [name]. ? ?Complete physical exam ? ? ?Patient: [name].   DOB: [birth year]   81 y.o. Male  MRN: [id number] ?Visit Date: 11/22/2021 ? ?Today's healthcare provider: [name], [name]  ? ?Cc. cpe ? ?Subjective  ?  ?[name]. is a 81 y.o. male who presents today for a complete physical exam.  ?He reports consuming a general diet.  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 He reports sleeping well. He does have additional problems to discuss today.  ? ?Reports chronically present lumps on his back that he has been told are cysts, reports increasingly annoying and tender. Would like them removed. ?Past Medical History:  ?Diagnosis Date  ? Arthritis   ? Back pain   ? hx  ? CAD (coronary artery disease)   ? a. 2004 s/p CABG x 4 (LIMA->LAD, VG->Diag, VG->OM, VG->RCA); b. 2007 Cath: VG->Diag occluded, otw 3/4 patent grafts.  ? CKD (chronic kidney disease), stage II-III   ? Colon polyps   ? DM2 (diabetes mellitus, type 2) ([place])   ? stable  ? GERD (gastroesophageal reflux disease)   ? Glaucoma   ? Gout   ? HLD (hyperlipidemia)   ? HTN (hypertension)   ? Neuropathy   ? ?Past Surgical History:  ?Procedure Laterality Date  ? BACK SURGERY    ? Cyst removed  ? CORONARY ARTERY BYPASS GRAFT  2004  ? CORONARY STENT INTERVENTION N/A 09/24/2021  ? Procedure: CORONARY STENT INTERVENTION;  Surgeon: [name], MD;  Location: [place] [facility];  Service: Cardiovascular;  Laterality: N/A;  ? KNEE SURGERY    ? LEFT HEART CATH AND CORONARY ANGIOGRAPHY Left 09/24/2021  ? Procedure: LEFT HEART CATH AND CORONARY ANGIOGRAPHY;  Surgeon: [name], MD;  Location: [place] [facility];  Service: Cardiovascular;   Laterality: Left;  ? ?Social History  ? ?Socioeconomic History  ? Marital status: Widowed  ?  Spouse name: Not on file  ? Number of children: 3  ? Years of education: Not on file  ? Highest education level: 8th grade  ?Occupational History  ? Not on file  ?Tobacco Use  ? Smoking status: Never  ? Smokeless tobacco: Current  ?  Types: Chew  ? Tobacco comments:  ?  chew tobacco - 1 pack last 3 weeks  ?Vaping Use  ? Vaping Use: Never used  ?Substance and Sexual Activity  ? Alcohol use: No  ?  Alcohol/week: 0.0 standard drinks  ? Drug use: No  ? Sexual activity: Not Currently  ?Other Topics Concern  ? Not on file  ?Social History Narrative  ? Not on file  ? ?Social Determinants of Health  ? ?Financial Resource Strain: Not on file  ?Food Insecurity: Not on file  ?Transportation Needs: Not on file  ?Physical Activity: Not on file  ?Stress: Not on file  ?Social Connections: Not on file  ?Intimate Partner Violence: Not on file  ? ?Family Status  ?Relation Name Status  ? Mother  Deceased  ? Father  Deceased  ? ?Family History  ?Family history unknown: Yes  ? ?Allergies  ?Allergen Reactions  ? Ramipril   ?  ?Patient Care Team: ?Emelia Loron as PCP - General (Physician  Assistant) ?Minna Merritts, MD as PCP - Cardiology (Cardiology) ?Minna Merritts, MD as Consulting Physician (Cardiology) ?Robert Bellow, MD (General Surgery)  ? ?Medications: ?Outpatient Medications Prior to Visit  ?Medication Sig  ? ACCU-CHEK AVIVA PLUS test strip USE AS DIRECTED TO CHECK FASTING BLOOD SUGAR EVERY MORNING  ? allopurinol (ZYLOPRIM) 300 MG tablet TAKE 1 TABLET BY MOUTH ONCE DAILY  ? aspirin 81 MG chewable tablet Chew 81 mg by mouth daily.  ? atorvastatin (LIPITOR) 20 MG tablet TAKE 1 TABLET BY MOUTH ONCE DAILY  ? BD SHARPS CONTAINER HOME MISC Dispose of syringes after B12 injections  ? Blood Glucose Monitoring Suppl (ACCU-CHEK AVIVA PLUS) w/Device KIT To check blood sugar once daily  ? carvedilol (COREG) 6.25 MG tablet Take 1  tablet (6.25 mg total) by mouth 2 (two) times daily.  ? dapagliflozin propanediol (FARXIGA) 10 MG TABS tablet Take 1 tablet (10 mg total) by mouth daily. Please schedule office visit before any future refills.  ? furosemide (LASIX) 20 MG tablet TAKE 1 TABLET BY MOUTH AS DIRECTED. TAKE1 TABLET TWICE DAILY 3 DAYS PER WEEK AND1 TABLET ONCE DAILY ON OTHER DAYS.  ? gabapentin (NEURONTIN) 400 MG capsule TAKE 1 CAPSULE BY MOUTH 3 TIMES A DAY  ? glipiZIDE (GLUCOTROL XL) 10 MG 24 hr tablet Take 1 tablet (10 mg total) by mouth daily with breakfast.  ? isosorbide mononitrate (IMDUR) 120 MG 24 hr tablet Take 1 tablet (120 mg total) by mouth daily.  ? JANUVIA 50 MG tablet TAKE 1 TABLET BY MOUTH ONCE A DAY  ? lisinopril (ZESTRIL) 20 MG tablet TAKE 1 TABLET BY MOUTH ONCE DAILY  ? meloxicam (MOBIC) 7.5 MG tablet TAKE 1 TABLET BY MOUTH TWICE DAILY  ? metFORMIN (GLUCOPHAGE) 1000 MG tablet TAKE 1 TABLET BY MOUTH TWICE A DAY WITH A MEAL  ? nitroGLYCERIN (NITROSTAT) 0.4 MG SL tablet Place 1 tablet (0.4 mg total) under the tongue every 5 (five) minutes as needed for chest pain.  ? potassium chloride (KLOR-CON) 10 MEQ tablet TAKE 1 TABLET BY MOUTH ONCE DAILY  ? ranolazine (RANEXA) 500 MG 12 hr tablet Take 1 tablet (500 mg total) by mouth 2 (two) times daily.  ? SYRINGE-NEEDLE, DISP, 3 ML (B-D SYRINGE/NEEDLE 3CC/25GX5/8) 25G X 5/8" 3 ML MISC For use with cyanocobalamin injections  ? tamsulosin (FLOMAX) 0.4 MG CAPS capsule Take 1 capsule (0.4 mg total) by mouth daily.  ? ?Facility-Administered Medications Prior to Visit  ?Medication Dose Route Frequency Provider  ? sodium chloride flush (NS) 0.9 % injection 3 mL  3 mL Intravenous Q12H Furth, Cadence H, [name]  ? ? ?Review of Systems  ?Constitutional:  Negative for fatigue and fever.  ?Respiratory:  Negative for cough and shortness of breath.   ?Cardiovascular:  Negative for chest pain, palpitations and leg swelling.  ?Genitourinary:  Positive for frequency.  ?Skin:   ?     Lumps on back   ?Neurological:  Negative for dizziness and headaches.  ? ? ? Objective  ? ?  ?Blood pressure 136/62, pulse 63, height '5\' 10"'  (1.778 m), weight 171 lb 14.4 oz (78 kg), SpO2 98 %.  ? ? ?Physical Exam ?Constitutional:   ?   General: He is awake.  ?   Appearance: He is well-developed.  ?HENT:  ?   Head: Normocephalic.  ?   Right Ear: Tympanic membrane, ear canal and external ear normal.  ?   Left Ear: Tympanic membrane, ear canal and external ear normal.  ?  Nose: Nose normal. No congestion or rhinorrhea.  ?   Mouth/Throat:  ?   Mouth: Mucous membranes are moist.  ?   Pharynx: No oropharyngeal exudate or posterior oropharyngeal erythema.  ?Eyes:  ?   Pupils: Pupils are equal, round, and reactive to light.  ?Cardiovascular:  ?   Rate and Rhythm: Normal rate and regular rhythm.  ?   Heart sounds: Normal heart sounds.  ?Pulmonary:  ?   Effort: Pulmonary effort is normal.  ?   Breath sounds: Normal breath sounds.  ?Abdominal:  ?   General: There is no distension.  ?   Palpations: Abdomen is soft.  ?   Tenderness: There is no abdominal tenderness. There is no guarding.  ?Musculoskeletal:  ?   Cervical back: Normal range of motion.  ?   Right lower leg: No edema.  ?   Left lower leg: No edema.  ?Lymphadenopathy:  ?   Cervical: No cervical adenopathy.  ?Skin: ?   General: Skin is warm.  ?   Comments: R scapular area w/ a well circumscribed, soft mobile mass, 3-4 cm ?Midline lumbar spine w/ a well circumscribed, superficial, soft minimally mobile mass, 1-2 cm  ?Neurological:  ?   Mental Status: He is alert and oriented to person, place, and time.  ?Psychiatric:     ?   Attention and Perception: Attention normal.     ?   Mood and Affect: Mood normal.     ?   Speech: Speech normal.     ?   Behavior: Behavior normal. Behavior is cooperative.  ?  ? ?Last depression screening scores ? ?  11/22/2021  ?  8:38 AM 08/26/2021  ?  9:08 AM 07/25/2020  ?  8:18 AM  ?PHQ 2/9 Scores  ?PHQ - 2 Score 0  0  ?PHQ- 9 Score 0    ?Exception  Documentation Patient refusal Patient refusal   ? ?Last fall risk screening ? ?  11/22/2021  ?  8:38 AM  ?Fall Risk   ?Falls in the past year? 0  ?Number falls in past yr: 0  ?Injury with Fall? 0  ?Risk for fall due to : No

## 2021-11-22 ENCOUNTER — Ambulatory Visit (INDEPENDENT_AMBULATORY_CARE_PROVIDER_SITE_OTHER): Payer: Medicare PPO | Admitting: Physician Assistant

## 2021-11-22 ENCOUNTER — Encounter: Payer: Self-pay | Admitting: Physician Assistant

## 2021-11-22 VITALS — BP 136/62 | HR 63 | Ht 70.0 in | Wt 171.9 lb

## 2021-11-22 DIAGNOSIS — E1169 Type 2 diabetes mellitus with other specified complication: Secondary | ICD-10-CM

## 2021-11-22 DIAGNOSIS — E1140 Type 2 diabetes mellitus with diabetic neuropathy, unspecified: Secondary | ICD-10-CM

## 2021-11-22 DIAGNOSIS — I152 Hypertension secondary to endocrine disorders: Secondary | ICD-10-CM

## 2021-11-22 DIAGNOSIS — E1159 Type 2 diabetes mellitus with other circulatory complications: Secondary | ICD-10-CM

## 2021-11-22 DIAGNOSIS — Z0000 Encounter for general adult medical examination without abnormal findings: Secondary | ICD-10-CM | POA: Diagnosis not present

## 2021-11-22 DIAGNOSIS — L720 Epidermal cyst: Secondary | ICD-10-CM | POA: Insufficient documentation

## 2021-11-22 DIAGNOSIS — E785 Hyperlipidemia, unspecified: Secondary | ICD-10-CM

## 2021-11-22 NOTE — Assessment & Plan Note (Signed)
Will check A1c for stability or improvement ?Cardio would like better control, may not be possible without adding an agent and that is not ideal ?Pt may have to control better w/ diet ?uacr today ?

## 2021-11-22 NOTE — Assessment & Plan Note (Signed)
Historically stable, managed with atorvastatin 20  ?

## 2021-11-22 NOTE — Assessment & Plan Note (Signed)
Two on back ?As they frustrate pt ref to gen surg ?

## 2021-11-22 NOTE — Assessment & Plan Note (Signed)
Stable controlled with current medications, continue ?

## 2021-11-23 LAB — CBC WITH DIFFERENTIAL/PLATELET
Basophils Absolute: 0.1 10*3/uL (ref 0.0–0.2)
Basos: 1 %
EOS (ABSOLUTE): 0.3 10*3/uL (ref 0.0–0.4)
Eos: 4 %
Hematocrit: 35.3 % — ABNORMAL LOW (ref 37.5–51.0)
Hemoglobin: 11.2 g/dL — ABNORMAL LOW (ref 13.0–17.7)
Immature Grans (Abs): 0 10*3/uL (ref 0.0–0.1)
Immature Granulocytes: 0 %
Lymphocytes Absolute: 2.6 10*3/uL (ref 0.7–3.1)
Lymphs: 32 %
MCH: 25.1 pg — ABNORMAL LOW (ref 26.6–33.0)
MCHC: 31.7 g/dL (ref 31.5–35.7)
MCV: 79 fL (ref 79–97)
Monocytes Absolute: 0.6 10*3/uL (ref 0.1–0.9)
Monocytes: 7 %
Neutrophils Absolute: 4.5 10*3/uL (ref 1.4–7.0)
Neutrophils: 56 %
Platelets: 250 10*3/uL (ref 150–450)
RBC: 4.47 x10E6/uL (ref 4.14–5.80)
RDW: 15.7 % — ABNORMAL HIGH (ref 11.6–15.4)
WBC: 8.1 10*3/uL (ref 3.4–10.8)

## 2021-11-23 LAB — HEPATIC FUNCTION PANEL
ALT: 18 IU/L (ref 0–44)
AST: 16 IU/L (ref 0–40)
Albumin: 4.5 g/dL (ref 3.7–4.7)
Alkaline Phosphatase: 56 IU/L (ref 44–121)
Bilirubin Total: 0.3 mg/dL (ref 0.0–1.2)
Bilirubin, Direct: 0.11 mg/dL (ref 0.00–0.40)
Total Protein: 6.6 g/dL (ref 6.0–8.5)

## 2021-11-23 LAB — MICROALBUMIN / CREATININE URINE RATIO
Creatinine, Urine: 52.4 mg/dL
Microalb/Creat Ratio: <6 mg/g creat (ref 0–29)
Microalbumin, Urine: <3 ug/mL

## 2021-11-23 LAB — HEMOGLOBIN A1C
Est. average glucose Bld gHb Est-mCnc: 192 mg/dL
Hgb A1c MFr Bld: 8.3 % — ABNORMAL HIGH (ref 4.8–5.6)

## 2021-11-25 ENCOUNTER — Other Ambulatory Visit: Payer: Self-pay | Admitting: Physician Assistant

## 2021-11-25 DIAGNOSIS — E1140 Type 2 diabetes mellitus with diabetic neuropathy, unspecified: Secondary | ICD-10-CM

## 2021-11-25 MED ORDER — SITAGLIPTIN PHOSPHATE 100 MG PO TABS: 100.0000 mg | ORAL_TABLET | Freq: Every day | ORAL | 1 refills | Status: DC

## 2021-11-26 ENCOUNTER — Other Ambulatory Visit: Payer: Self-pay | Admitting: Family Medicine

## 2021-11-28 ENCOUNTER — Encounter: Payer: Self-pay | Admitting: Surgery

## 2021-11-28 ENCOUNTER — Telehealth: Payer: Self-pay

## 2021-11-28 ENCOUNTER — Other Ambulatory Visit: Payer: Self-pay | Admitting: Surgery

## 2021-11-28 ENCOUNTER — Ambulatory Visit (INDEPENDENT_AMBULATORY_CARE_PROVIDER_SITE_OTHER): Payer: Medicare PPO | Admitting: Surgery

## 2021-11-28 VITALS — BP 198/79 | HR 60 | Temp 98.0°F | Ht 69.0 in | Wt 169.8 lb

## 2021-11-28 DIAGNOSIS — L723 Sebaceous cyst: Secondary | ICD-10-CM

## 2021-11-28 DIAGNOSIS — L720 Epidermal cyst: Secondary | ICD-10-CM | POA: Diagnosis not present

## 2021-11-28 NOTE — Telephone Encounter (Signed)
Patient had excision of back cyst this morning in clinic- he stated he has blood running down his back and has soiled his clothing. I have asked patient to return to office now.  ?

## 2021-11-28 NOTE — Progress Notes (Signed)
Progress Note ?Patient returned to clinic this afternoon secondary to concerns of bleeding from his incisions. He reports that this had been quite significant and attempts at applying pressure at home were unsuccessful prompting his return. He is on 81 mg ASA; no other anticoagulation. On arrival, his bleeding had subsided. ? ?On Exam ?His most superior excision site to his mid-upper back was CDI with sutures, no gross bleeding. I reapplied pressure dressing ?His more inferior incision appeared to have been oozing. There was some expressed hematoma underlying this. I held pressure x5 minutes and there was no further bleeding. Sutures intact. Pressure dressing applied.  ? ?Plan:  ?-- Pressure dressings applied; supplies given ?-- Hold ASA tomorrow ?-- Call with questions concerns ? ?-- ?Lynden Oxford, [name] ? [facility] ?11/28/2021, 4:46 PM ?M-F: 7am - 4pm  ?

## 2021-11-28 NOTE — Progress Notes (Signed)
Patient ID: [name] Gross Sr., male   DOB: 12/08/1940, 81 y.o.   MRN: [id number] ? ?Chief Complaint: Symptomatic dermal cysts on back and left shoulder area ? ?History of Present Illness ?[name] Lower Hollenbaugh Sr. is a 81 y.o. male with the presence of the cyst, increasing in prominence over 2 years.  No drainage, persistent discomfort, frequently bumping against objects.  No fevers no chills, no prior drainage.  History of prior excision of similar cyst from the back area.  Patient takes baby aspirin. ? ? ?Past Medical History ?Past Medical History:  ?Diagnosis Date  ? Arthritis   ? Back pain   ? hx  ? CAD (coronary artery disease)   ? a. 2004 s/p CABG x 4 (LIMA->LAD, VG->Diag, VG->OM, VG->RCA); b. 2007 Cath: VG->Diag occluded, otw 3/4 patent grafts.  ? CKD (chronic kidney disease), stage II-III   ? Colon polyps   ? DM2 (diabetes mellitus, type 2) ([place])   ? stable  ? GERD (gastroesophageal reflux disease)   ? Glaucoma   ? Gout   ? HLD (hyperlipidemia)   ? HTN (hypertension)   ? Neuropathy   ?  ? ? ?Past Surgical History:  ?Procedure Laterality Date  ? BACK SURGERY    ? Cyst removed  ? CORONARY ARTERY BYPASS GRAFT  2004  ? CORONARY STENT INTERVENTION N/A 09/24/2021  ? Procedure: CORONARY STENT INTERVENTION;  Surgeon: [name], MD;  Location: [facility];  Service: Cardiovascular;  Laterality: N/A;  ? KNEE SURGERY    ? LEFT HEART CATH AND CORONARY ANGIOGRAPHY Left 09/24/2021  ? Procedure: LEFT HEART CATH AND CORONARY ANGIOGRAPHY;  Surgeon: [name], MD;  Location: [facility];  Service: Cardiovascular;  Laterality: Left;  ? ? ?Allergies  ?Allergen Reactions  ? Ramipril   ? ? ?Current Outpatient Medications  ?Medication Sig Dispense Refill  ? ACCU-CHEK AVIVA PLUS test strip USE AS DIRECTED TO CHECK FASTING BLOOD SUGAR EVERY MORNING 100 each 4  ? allopurinol (ZYLOPRIM) 300 MG tablet TAKE 1 TABLET BY MOUTH ONCE DAILY 90 tablet 0  ? aspirin 81 MG chewable tablet Chew 81 mg by  mouth daily.    ? atorvastatin (LIPITOR) 20 MG tablet TAKE 1 TABLET BY MOUTH ONCE DAILY 90 tablet 1  ? BD SHARPS CONTAINER HOME MISC Dispose of syringes after B12 injections 1 each 5  ? Blood Glucose Monitoring Suppl (ACCU-CHEK AVIVA PLUS) w/Device KIT To check blood sugar once daily 1 kit 0  ? carvedilol (COREG) 6.25 MG tablet Take 1 tablet (6.25 mg total) by mouth 2 (two) times daily. 180 tablet 0  ? dapagliflozin propanediol (FARXIGA) 10 MG TABS tablet Take 1 tablet (10 mg total) by mouth daily. Please schedule office visit before any future refills. 90 tablet 3  ? furosemide (LASIX) 20 MG tablet TAKE 1 TABLET BY MOUTH AS DIRECTED. TAKE1 TABLET TWICE DAILY 3 DAYS PER WEEK AND1 TABLET ONCE DAILY ON OTHER DAYS. 120 tablet 2  ? gabapentin (NEURONTIN) 400 MG capsule TAKE 1 CAPSULE BY MOUTH 3 TIMES A DAY 90 capsule 5  ? isosorbide mononitrate (IMDUR) 120 MG 24 hr tablet Take 1 tablet (120 mg total) by mouth daily. 30 tablet 11  ? lisinopril (ZESTRIL) 20 MG tablet TAKE 1 TABLET BY MOUTH ONCE DAILY 30 tablet 0  ? meloxicam (MOBIC) 7.5 MG tablet TAKE 1 TABLET BY MOUTH TWICE DAILY 180 tablet 0  ? metFORMIN (GLUCOPHAGE) 1000 MG tablet TAKE 1 TABLET BY MOUTH TWICE A DAY WITH A  MEAL 180 tablet 1  ? nitroGLYCERIN (NITROSTAT) 0.4 MG SL tablet Place 1 tablet (0.4 mg total) under the tongue every 5 (five) minutes as needed for chest pain. 25 tablet 6  ? potassium chloride (KLOR-CON) 10 MEQ tablet TAKE 1 TABLET BY MOUTH ONCE DAILY 90 tablet 1  ? ranolazine (RANEXA) 500 MG 12 hr tablet Take 1 tablet (500 mg total) by mouth 2 (two) times daily. 60 tablet 6  ? sitaGLIPtin (JANUVIA) 100 MG tablet Take 1 tablet (100 mg total) by mouth daily. 90 tablet 1  ? SYRINGE-NEEDLE, DISP, 3 ML (B-D SYRINGE/NEEDLE 3CC/25GX5/8) 25G X 5/8" 3 ML MISC For use with cyanocobalamin injections 50 each 0  ? tamsulosin (FLOMAX) 0.4 MG CAPS capsule Take 1 capsule (0.4 mg total) by mouth daily. 30 capsule 0  ? glipiZIDE (GLUCOTROL XL) 10 MG 24 hr tablet Take  1 tablet (10 mg total) by mouth daily with breakfast. 90 tablet 3  ? ?Current Facility-Administered Medications  ?Medication Dose Route Frequency Provider Last Rate Last Admin  ? sodium chloride flush (NS) 0.9 % injection 3 mL  3 mL Intravenous Q12H Furth, Cadence H, [name]      ? ? ?Family History ?Family History  ?Family history unknown: Yes  ?  ? ? ?Social History ?Social History  ? ?Tobacco Use  ? Smoking status: Never  ? Smokeless tobacco: Current  ?  Types: Chew  ? Tobacco comments:  ?  chew tobacco - 1 pack last 3 weeks  ?Vaping Use  ? Vaping Use: Never used  ?Substance Use Topics  ? Alcohol use: No  ?  Alcohol/week: 0.0 standard drinks  ? Drug use: No  ?  ?  ? ? ?Review of Systems  ?All other systems reviewed and are negative. ?  ? ?Physical Exam ?Blood pressure (!) 198/79, pulse 60, temperature 98 ?F (36.7 ?C), temperature source Oral, height _0  (1.753 m), weight 169 lb 12.8 oz (77 kg), SpO2 99 %. ?Last Weight  Most recent update: 11/28/2021  9:26 AM  ? ? Weight  ?77 kg (169 lb 12.8 oz)  ?      ? ?  ? ? ?CONSTITUTIONAL: Well developed, and nourished, appropriately responsive and aware without distress.   ?EYES: Sclera non-icteric.   ?EARS, NOSE, MOUTH AND THROAT:  The oropharynx is clear. Oral mucosa is pink and moist.  Hearing is intact to voice.  ?NECK: Trachea is midline, and there is no jugular venous distension.  ?LYMPH NODES:  Lymph nodes in the neck are not enlarged. ?RESPIRATORY:  Lungs are clear, and breath sounds are equal bilaterally. Normal respiratory effort without pathologic use of accessory muscles. ?CARDIOVASCULAR: Heart is regular in rate and rhythm. ?GI: The abdomen is soft, nontender, and nondistended.  ?MUSCULOSKELETAL:  Symmetrical muscle tone appreciated in all four extremities.    ?SKIN: Skin turgor is normal. No pathologic skin lesions appreciated.  ?Comedone without mass, in a couple separate areas along midline of back.  Scar of upper midline back noted from prior cyst excision.   Discrete well-defined 2.3 to 2.4 cm masses of the left parascapular area and lower left lumbar back, both of these appear to be prominent least sufficient to cause some discomfort with bumping against chairs etc. ?NEUROLOGIC:  Motor and sensation appear grossly normal.  Cranial nerves are grossly without defect. ?PSYCH:  Alert and oriented to person, place and time. Affect is appropriate for situation. ? ?Data Reviewed ?I have personally reviewed what is currently available of the patient's  imaging, recent labs and medical records.   ?Labs:  ? ?  Latest Ref Rng & Units 11/22/2021  ?  9:11 AM 09/19/2021  ?  8:47 AM 05/16/2021  ?  9:23 AM  ?CBC  ?WBC 3.4 - 10.8 x10E3/uL 8.1   6.0   7.1    ?Hemoglobin 13.0 - 17.7 g/dL 11.2   10.8   11.4    ?Hematocrit 37.5 - 51.0 % 35.3   33.7   35.5    ?Platelets 150 - 450 x10E3/uL 250   211   238    ? ? ?  Latest Ref Rng & Units 11/22/2021  ?  9:11 AM 09/19/2021  ?  8:47 AM 05/16/2021  ?  9:23 AM  ?CMP  ?Glucose 70 - 99 mg/dL  123   109    ?BUN 8 - 27 mg/dL  12   15    ?Creatinine 0.76 - 1.27 mg/dL  1.17   1.37    ?Sodium 134 - 144 mmol/L  140   141    ?Potassium 3.5 - 5.2 mmol/L  4.8   4.8    ?Chloride 96 - 106 mmol/L  104   104    ?CO2 20 - 29 mmol/L  23   21    ?Calcium 8.6 - 10.2 mg/dL  9.4   9.4    ?Total Protein 6.0 - 8.5 g/dL 6.6    6.6    ?Total Bilirubin 0.0 - 1.2 mg/dL 0.3    0.3    ?Alkaline Phos 44 - 121 IU/L 56    57    ?AST 0 - 40 IU/L 16    16    ?ALT 0 - 44 IU/L 18    19    ? ? ? ? ?Imaging: ? ?Within last 24 hrs: No results found. ? ?Assessment ?   ?Epithelial inclusion cysts x2 to the back.  Both greater than 2.1 cm. ?Patient Active Problem List  ? Diagnosis Date Noted  ? Epithelial inclusion cyst 11/22/2021  ? Abnormal stress test 09/24/2021  ? 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 CKD (chronic kidney disease), stage II   ? 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 Primary osteoarthritis of both knees 01/20/2018  ? CAD (coronary artery disease), native coronary  artery 08/04/2017  ? Neuropathy 05/22/2016  ? Type 2 diabetes mellitus with diabetic neuropathy, unspecified ([place]) 11/07/2009  ? Mixed hyperlipidemia 11/07/2009  ? GOUT, UNSPECIFIED 11/07/2009  ? Hyperte

## 2021-11-28 NOTE — Patient Instructions (Addendum)
Keep dressing dry for 3 days. ? ? ?If you have any concerns or questions, please feel free to call our office. See follow up appointment below.  ? ?Epidermoid Cyst Removal, Care After ?This sheet gives you information about how to care for yourself after your procedure. Your health care provider may also give you more specific instructions. If you have problems or questions, contact your health care provider. ?What can I expect after the procedure? ?After the procedure, it is common to have: ?Soreness in the area where your cyst was removed. ?Tightness or itchiness from the stitches (sutures) in your skin. ?Follow these instructions at home: ?Medicines ?Take over-the-counter and prescription medicines only as told by your health care provider. ?If you were prescribed an antibiotic medicine or ointment, take or apply it as told by your health care provider. Do not stop using the antibiotic even if you start to feel better. ?Incision care ? ?Follow instructions from your health care provider about how to take care of your incision. Make sure you: ?Wash your hands with soap and water for at least 20 seconds before you change your bandage (dressing). If soap and water are not available, use hand sanitizer. ?Change your dressing as told by your health care provider. ?Leave sutures, skin glue, or adhesive strips in place. These skin closures may need to stay in place for 1-2 weeks or longer. If adhesive strip edges start to loosen and curl up, you may trim the loose edges. Do not remove adhesive strips completely unless your health care provider tells you to do that. ?Keep the dressing dry until your health care provider says that it can be removed. ?After your dressing is off, check your incision area every day for signs of infection. Check for: ?Redness, swelling, or pain. ?Fluid or blood. ?Warmth. ?Pus or a bad smell. ?General instructions ?Do not take baths, swim, or use a hot tub until your health care provider  approves. Ask your health care provider if you may take showers. You may only be allowed to take sponge baths. ?Your health care provider may ask you to avoid contact sports or activities that take a lot of effort. Do not do anything that stretches or puts pressure on your incision. ?You can return to your normal diet. ?Keep all follow-up visits. This is important. ?Contact a health care provider if: ?You have a fever. ?You have redness, swelling, or pain in the incision area. ?You have fluid or blood coming from your incision. ?You have pus or a bad smell coming from your incision. ?Your incision feels warm to the touch. ?Your cyst grows back. ?Get help right away if: ?If the incision site suddenly increases in size and you have pain at the incision site. You may be checked for a collection of blood under the skin from the procedure (hematoma). ?Summary ?After the procedure, it is common to have soreness in the area where your cyst was removed. ?Take or apply over-the-counter and prescription medicines only as told by your health care provider. ?Follow instructions from your health care provider about how to take care of your incision. ?This information is not intended to replace advice given to you by your health care provider. Make sure you discuss any questions you have with your health care provider. ?Document Revised: 10/19/2019 Document Reviewed: 10/19/2019 ?Elsevier Patient Education ? [place]. ? ? ?

## 2021-11-28 NOTE — Progress Notes (Signed)
Excision of 2 symptomatic dermal cysts from the skin of the back, sized 2.3 and 2.5 cm. ? ?Pre-operative Diagnosis: Symptomatic dermal cysts on lumbar back, and parascapular area, left. ? ?Post-operative Diagnosis: same.   ? ?Surgeon: Campbell Lerner, M.D., FACS ? ?Anesthesia: Local ? ?Findings: Consistent with benign cysts. ? ?Estimated Blood Loss: 5 mL ?        ?Specimens: Skin and subcutaneous tissues and cystic contents x2.    ?      ?Complications: none ?        ?     ?Procedure Details  ?The patient was seen again in the Holding Room. The benefits, complications, treatment options, and expected outcomes were discussed with the patient. The risks of bleeding, infection, recurrence of symptoms, failure to resolve symptoms, unanticipated injury, prosthetic placement, prosthetic infection, any of which could require further surgery were reviewed with the patient. The likelihood of improving the patient's symptoms with return to their baseline status is expected.  The patient and/or family concurred with the proposed plan, giving informed consent.  The patient was taken to Operating Room, identified and the procedure verified.   ? ?Te patient was positioned in the prone position and the back was prepped with  Chloraprep and draped in the sterile fashion.  ?A Time Out was held and the above information confirmed. ?Local infiltration of 1% lidocaine with epinephrine is applied to both areas of concern.  These 2 lesions were excised with her central punctum along with the underlying cystic component from the adjacent skin and subcutaneous tissues.  Hemostasis obtained with hemostats.  Incisions were then closed with vertical mattress sutures of 3-0 nylon.  Pressure dressing was applied.  Patient tolerated procedure well.  Specimen sent to pathology for confirmation. ? ? ? ? ? ?Campbell Lerner M.D., FACS ?[facility] ?11/28/2021 11:48 AM  ? ?

## 2021-12-12 ENCOUNTER — Other Ambulatory Visit: Payer: Self-pay | Admitting: Physician Assistant

## 2021-12-12 ENCOUNTER — Ambulatory Visit: Payer: Medicare PPO | Admitting: Surgery

## 2021-12-12 VITALS — BP 133/54 | HR 60 | Temp 98.3°F | Wt 169.0 lb

## 2021-12-12 DIAGNOSIS — L723 Sebaceous cyst: Secondary | ICD-10-CM

## 2021-12-12 DIAGNOSIS — Z09 Encounter for follow-up examination after completed treatment for conditions other than malignant neoplasm: Secondary | ICD-10-CM

## 2021-12-12 DIAGNOSIS — M1A9XX0 Chronic gout, unspecified, without tophus (tophi): Secondary | ICD-10-CM

## 2021-12-12 NOTE — Progress Notes (Signed)
[facility] POST-OP OFFICE VISIT  12/12/2021  HPI: [name] Arcia Sr. is a 81 y.o. male 2 weeks s/p excision of 2 dermal epidermal inclusion cysts from the back.  Denies any further bleeding.  No fevers no chills, no remarkable pain.  Vital signs: BP (!) 133/54   Pulse 60   Temp 98.3 F (36.8 C)   Wt 169 lb (76.7 kg)   SpO2 100%   BMI 24.96 kg/m    Physical Exam: Constitutional: Appears well  Skin: Back skin with 2 areas of sutures with vertical mattress sutures.  Slightly thickened but with no evidence of hematoma, erythema or drainage.  Sutures removed.  Instructions given.  Potential for lower incision to open discussed.  Assessment/Plan: This is a 81 y.o. male 14 days s/p excision of epidermal inclusion cyst from back.  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   Primary osteoarthritis of both knees 01/20/2018   CAD (coronary artery disease), native coronary artery 08/04/2017   Sebaceous cyst 09/10/2016   Neuropathy 05/22/2016   Type 2 diabetes mellitus with diabetic neuropathy, unspecified ([place]) 11/07/2009   Mixed hyperlipidemia 11/07/2009   GOUT, UNSPECIFIED 11/07/2009   Hypertension associated with diabetes ([place]) 11/07/2009   Accelerating angina ([place]) 11/07/2009   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  I believe he understands and will follow through.   Ronny Bacon M.D., FACS 12/12/2021, 11:30 AM

## 2021-12-12 NOTE — Patient Instructions (Signed)
If you have any concerns or questions, please feel free to call our office.   Epidermoid Cyst Removal, Care After This sheet gives you information about how to care for yourself after your procedure. Your health care provider may also give you more specific instructions. If you have problems or questions, contact your health care provider. What can I expect after the procedure? After the procedure, it is common to have: Soreness in the area where your cyst was removed. Tightness or itchiness from the stitches (sutures) in your skin. Follow these instructions at home: Medicines Take over-the-counter and prescription medicines only as told by your health care provider. If you were prescribed an antibiotic medicine or ointment, take or apply it as told by your health care provider. Do not stop using the antibiotic even if you start to feel better. Incision care  Follow instructions from your health care provider about how to take care of your incision. Make sure you: Wash your hands with soap and water for at least 20 seconds before you change your bandage (dressing). If soap and water are not available, use hand sanitizer. Change your dressing as told by your health care provider. Leave sutures, skin glue, or adhesive strips in place. These skin closures may need to stay in place for 1-2 weeks or longer. If adhesive strip edges start to loosen and curl up, you may trim the loose edges. Do not remove adhesive strips completely unless your health care provider tells you to do that. Keep the dressing dry until your health care provider says that it can be removed. After your dressing is off, check your incision area every day for signs of infection. Check for: Redness, swelling, or pain. Fluid or blood. Warmth. Pus or a bad smell. General instructions Do not take baths, swim, or use a hot tub until your health care provider approves. Ask your health care provider if you may take showers. You may  only be allowed to take sponge baths. Your health care provider may ask you to avoid contact sports or activities that take a lot of effort. Do not do anything that stretches or puts pressure on your incision. You can return to your normal diet. Keep all follow-up visits. This is important. Contact a health care provider if: You have a fever. You have redness, swelling, or pain in the incision area. You have fluid or blood coming from your incision. You have pus or a bad smell coming from your incision. Your incision feels warm to the touch. Your cyst grows back. Get help right away if: If the incision site suddenly increases in size and you have pain at the incision site. You may be checked for a collection of blood under the skin from the procedure (hematoma). Summary After the procedure, it is common to have soreness in the area where your cyst was removed. Take or apply over-the-counter and prescription medicines only as told by your health care provider. Follow instructions from your health care provider about how to take care of your incision. This information is not intended to replace advice given to you by your health care provider. Make sure you discuss any questions you have with your health care provider. Document Revised: 10/19/2019 Document Reviewed: 10/19/2019 Elsevier Patient Education  2023 Elsevier Inc.  

## 2021-12-17 ENCOUNTER — Other Ambulatory Visit: Payer: Self-pay | Admitting: Family Medicine

## 2021-12-31 ENCOUNTER — Other Ambulatory Visit: Payer: Self-pay | Admitting: Physician Assistant

## 2021-12-31 ENCOUNTER — Other Ambulatory Visit: Payer: Self-pay | Admitting: Family Medicine

## 2021-12-31 DIAGNOSIS — M2550 Pain in unspecified joint: Secondary | ICD-10-CM

## 2021-12-31 DIAGNOSIS — R600 Localized edema: Secondary | ICD-10-CM

## 2022-01-15 ENCOUNTER — Telehealth: Payer: Self-pay | Admitting: Cardiovascular Disease

## 2022-01-15 NOTE — Telephone Encounter (Signed)
Pt c/o of Chest Pain: STAT if CP now or developed within 24 hours  1. Are you having CP right now?  No   2. Are you experiencing any other symptoms (ex. SOB, nausea, vomiting, sweating)?  No   3. How long have you been experiencing CP?  Past few weeks   4. Is your CP continuous or coming and going?  Coming and going  5. Have you taken Nitroglycerin?  Yes, and patient's son states it helps  ?

## 2022-01-15 NOTE — Telephone Encounter (Signed)
Called and spoke with patient's son.   [name] reports that pt has been using his nitro more frequently, however not every day.   Advised son that per last OV note, he would likely need to reschedule visit with [facility] clinic in [place].   Call center had scheduled patient with Cadence Fransico Michael, [place] and son stated that they would like to speak with provider in our office before being re-referred to [facility] clinic.   Advised son that I would add patient to the wait list for a sooner appt.   Son understands that they may call should his chest pain worsen, and understands that patient should be taken to the ER with any chest pain that is not relieved with nitro.

## 2022-01-30 IMAGING — CT CT HEAD W/O CM
3 series · 15 of 47 positions shown, 18 images · non-contrast
Comparison: None.

CLINICAL DATA: Headache

EXAM:
CT HEAD WITHOUT CONTRAST
TECHNIQUE: Contiguous axial images were obtained from the base of the skull
through the vertex without intravenous contrast.

[Series 2: head 5.0 h30s · axial · 0.41mm/px · z∈[-106,+34]mm · 9 of 34 slices shown, 12 images]
[im 3/34  brain]
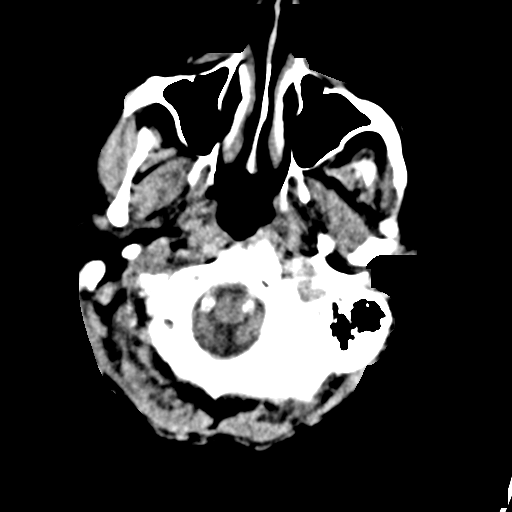
[im 3/34  bone]
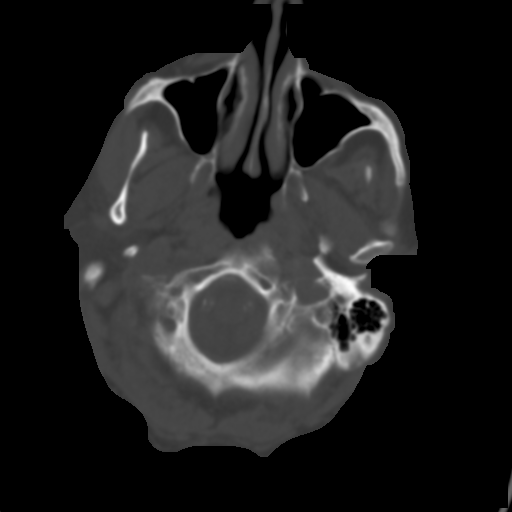
[im 6/34  brain]
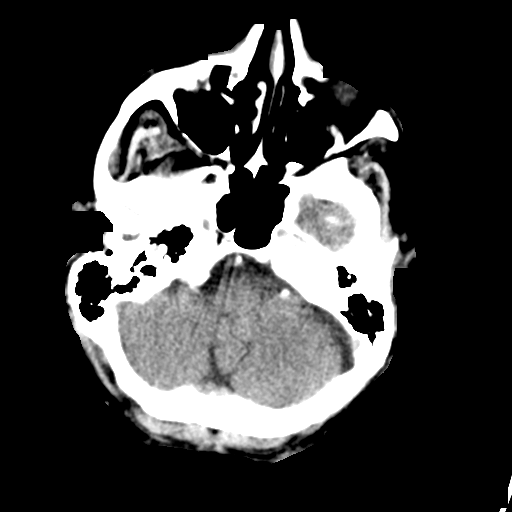
[im 10/34  brain]
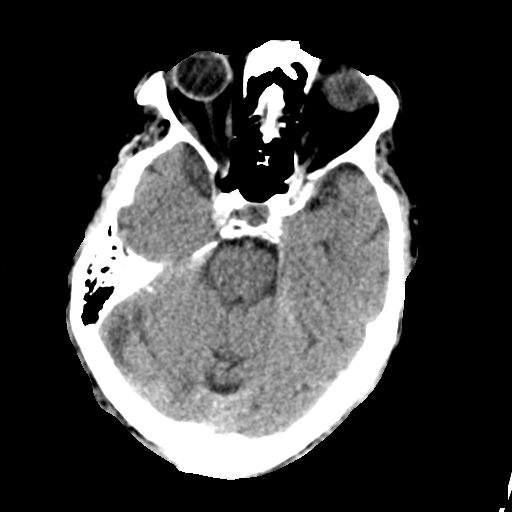
[im 13/34  brain]
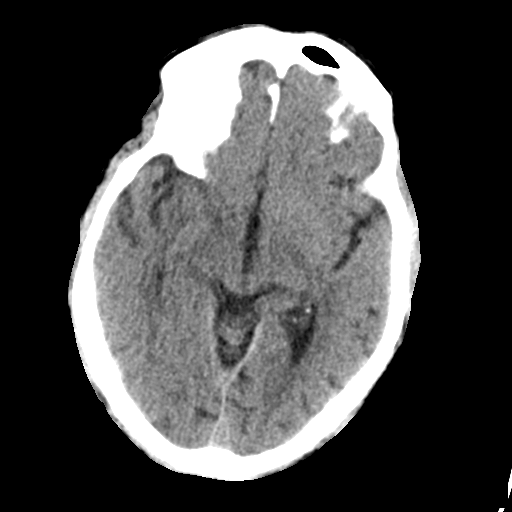
[im 18/34  brain]
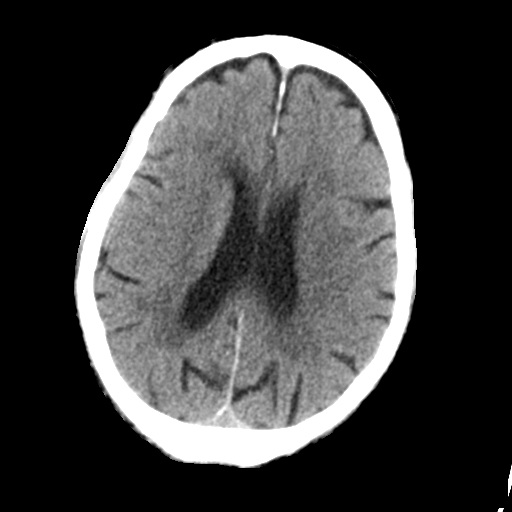
[im 18/34  bone]
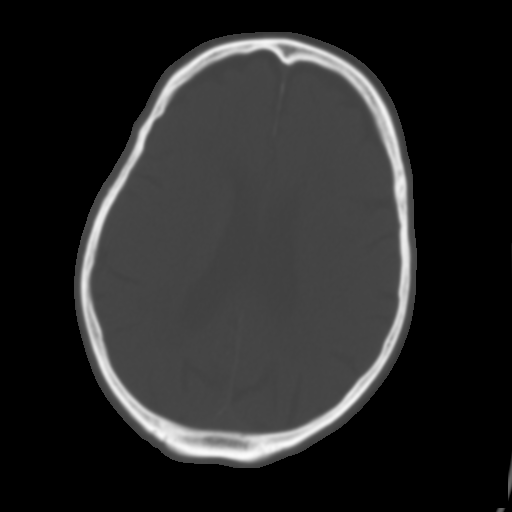
[im 21/34  brain]
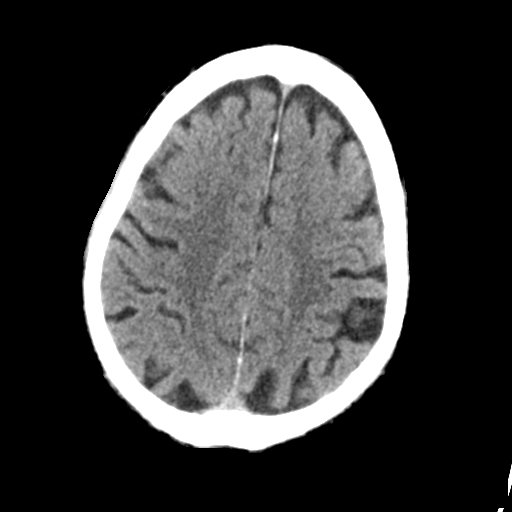
[im 24/34  brain]
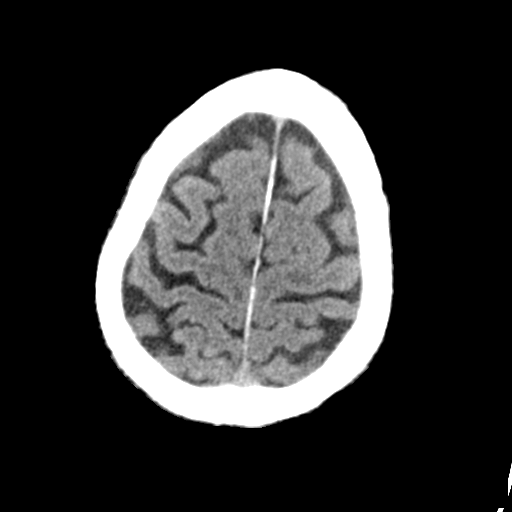
[im 28/34  brain]
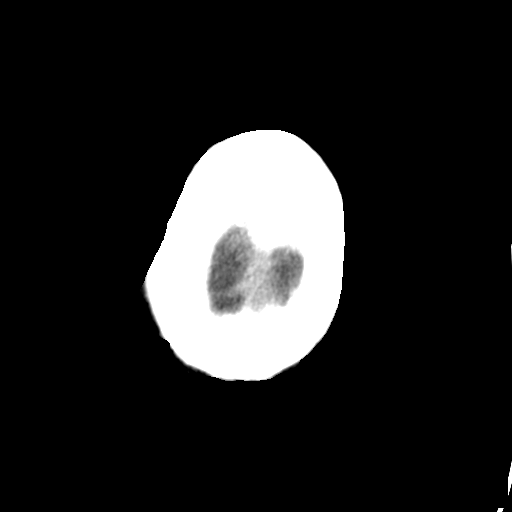
[im 31/34  brain]
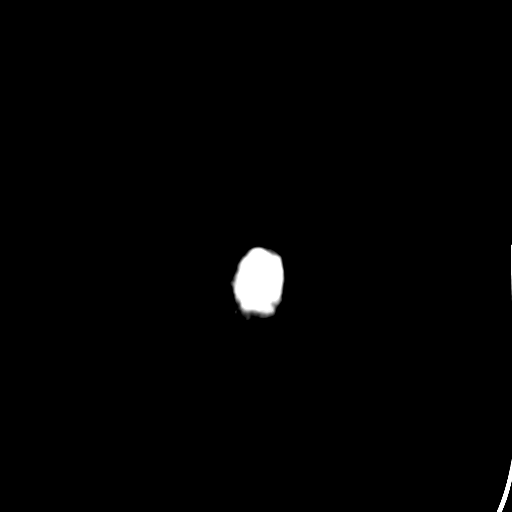
[im 31/34  bone]
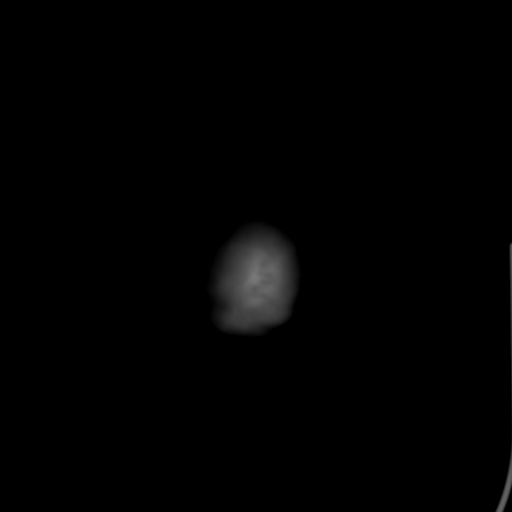

[Series 4: head 3.0 mpr cor · coronal · 0.32mm/px · 3 of 71 slices shown]
[im 24/71  brain]
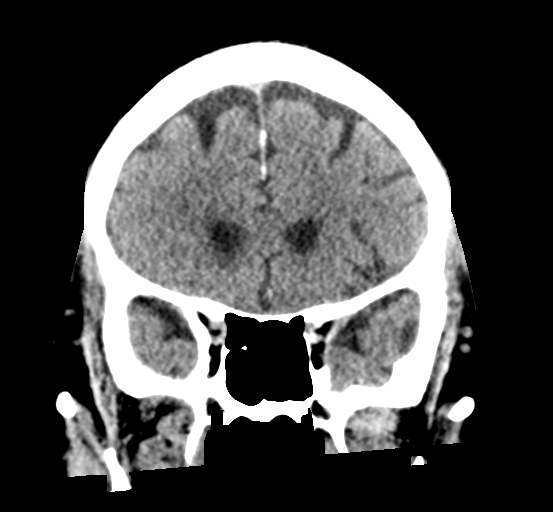
[im 32/71  brain]
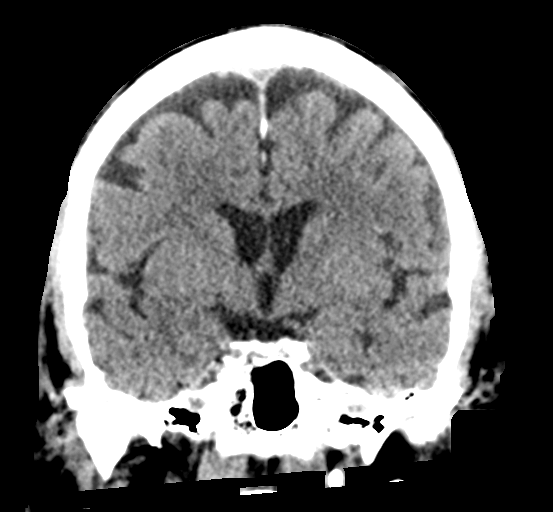
[im 39/71  brain]
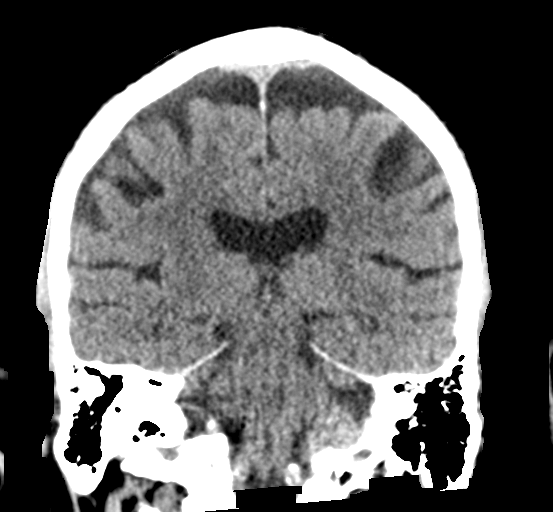

[Series 5: head 3.0 mpr sag · sagittal · 0.32mm/px · 3 of 59 slices shown]
[im 20/59  brain]
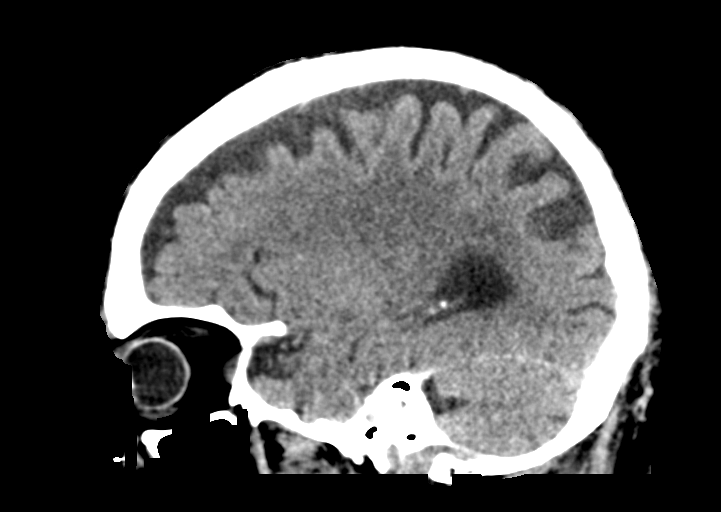
[im 30/59  brain]
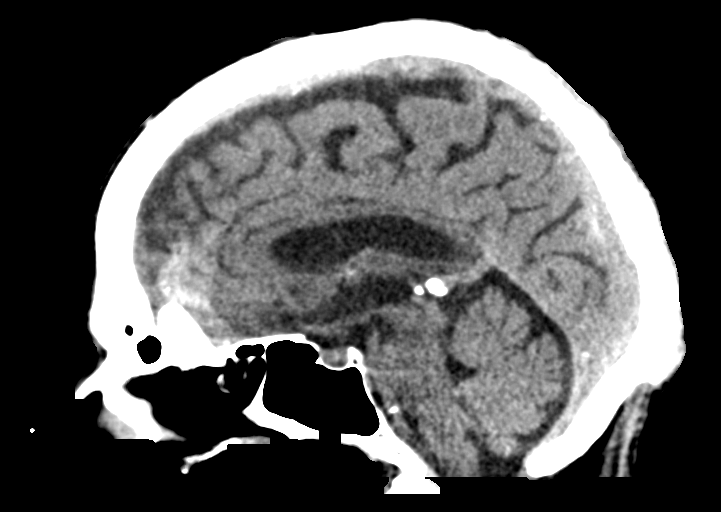
[im 39/59  brain]
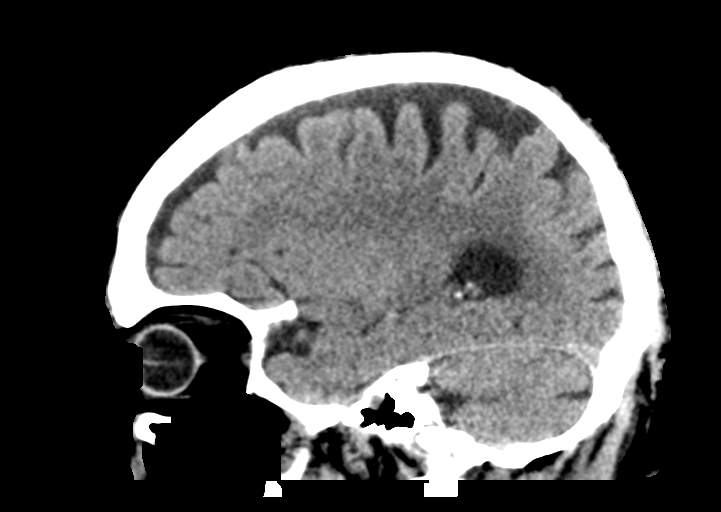

[15 of 47 positions shown; findings below may reference images not displayed]

FINDINGS: Brain: No acute territorial infarction, hemorrhage, or intracranial
mass is visualized. Mild atrophy. Mild hypodensity in the white
matter consistent with chronic small vessel ischemic change.
Nonenlarged ventricles.

Vascular: No hyperdense vessels. Vertebral and carotid vascular
calcification

Skull: Normal. Negative for fracture or focal lesion.

Sinuses/Orbits: No acute finding.

Other: None
IMPRESSION: 1. No CT evidence for acute intracranial abnormality.
2. Atrophy and mild chronic small vessel ischemic change of the
white matter

## 2022-01-30 IMAGING — CT CT CERVICAL SPINE W/O CM
4 of 10 series · 12 of 33 positions shown, 13 images · non-contrast
Comparison: Head CT today reported separately.

CLINICAL DATA: 79-year-old male with syncope, headache.

EXAM:
CT CERVICAL SPINE WITHOUT CONTRAST
TECHNIQUE: Multidetector CT imaging of the cervical spine was performed without
intravenous contrast. Multiplanar CT image reconstructions were also
generated.

[Series 6: st thins · axial · 0.31mm/px · z∈[-178,-126]mm · 2 of 312 slices shown]
[im 104/312  bone]
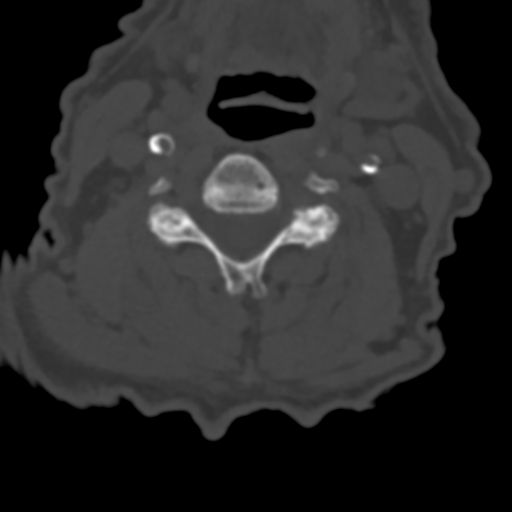
[im 208/312  bone]
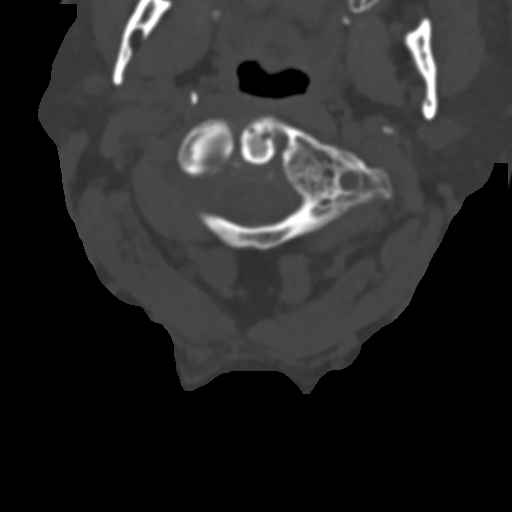

[Series 10: sagittal bone · sagittal · 0.23mm/px · 5 of 61 slices shown]
[im 11/61  bone]
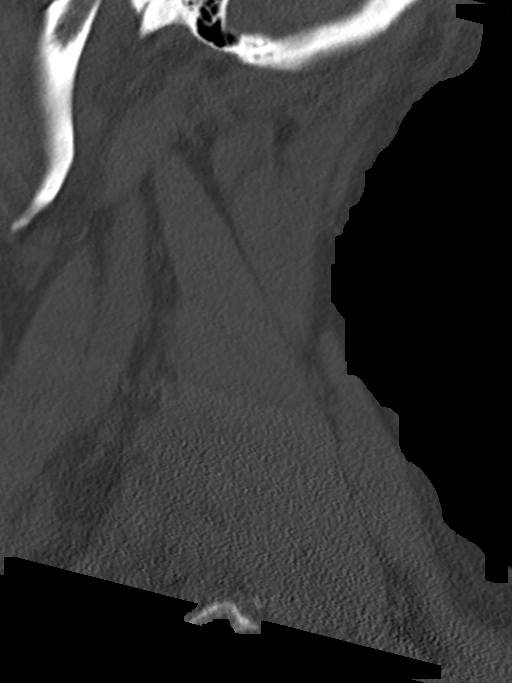
[im 21/61  bone]
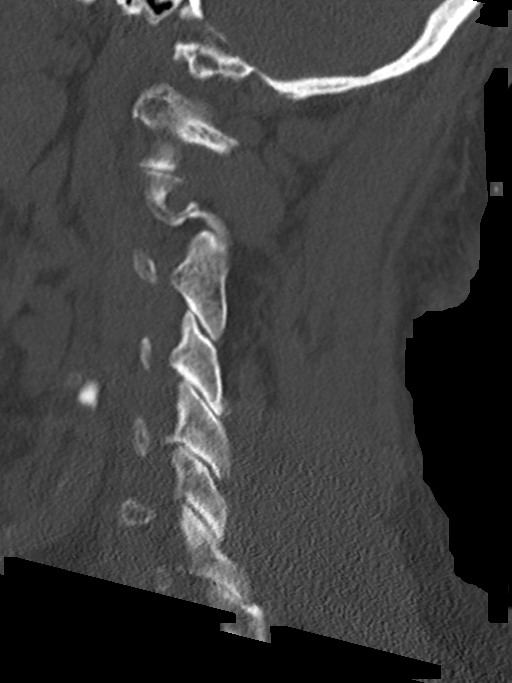
[im 31/61  bone]
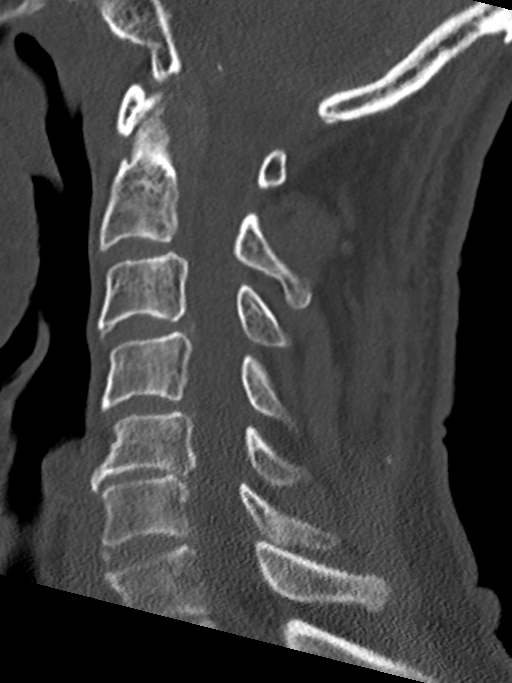
[im 41/61  bone]
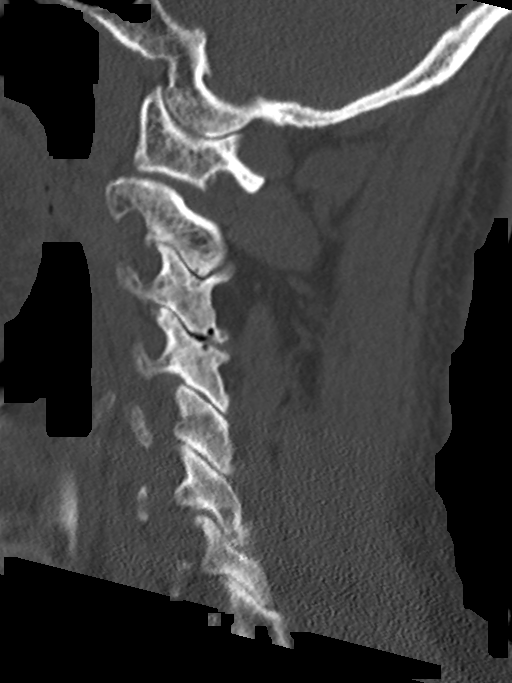
[im 51/61  bone]
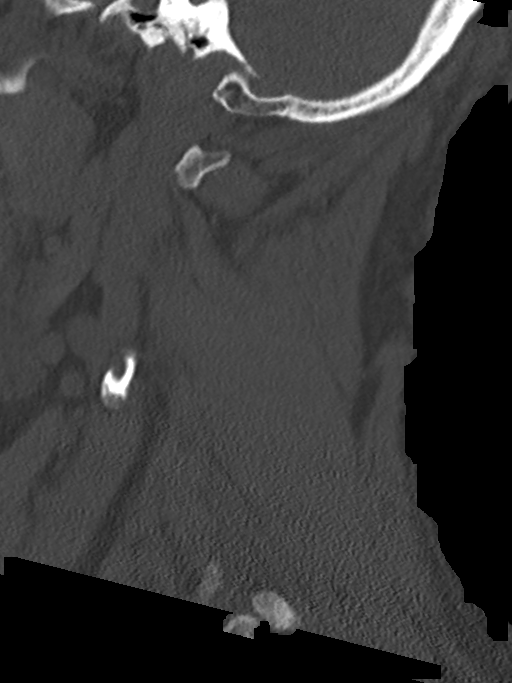

[Series 16: thins bone · axial · 0.37mm/px · z∈[-175,-70]mm · 3 of 421 slices shown, 4 images]
[im 106/421  soft-tissue]
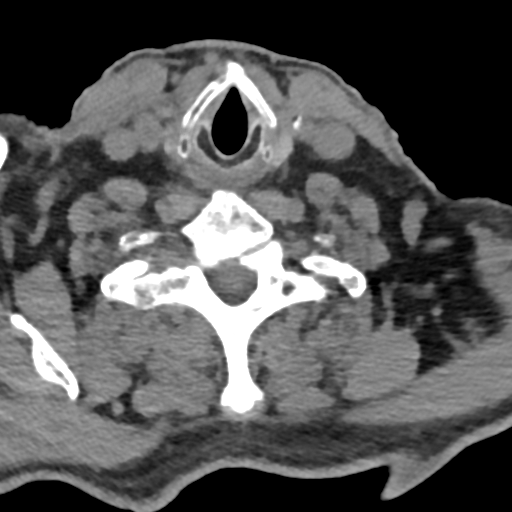
[im 106/421  bone]
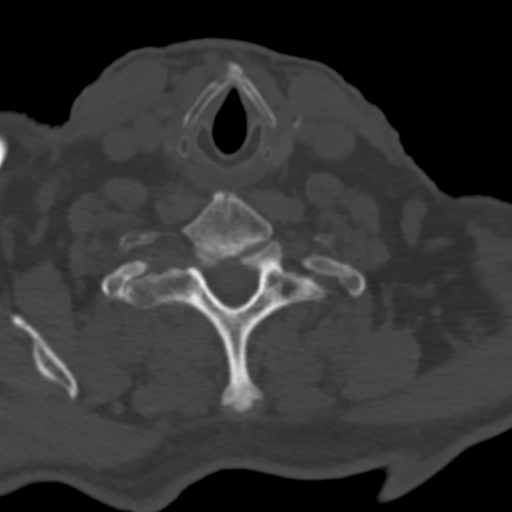
[im 211/421  bone]
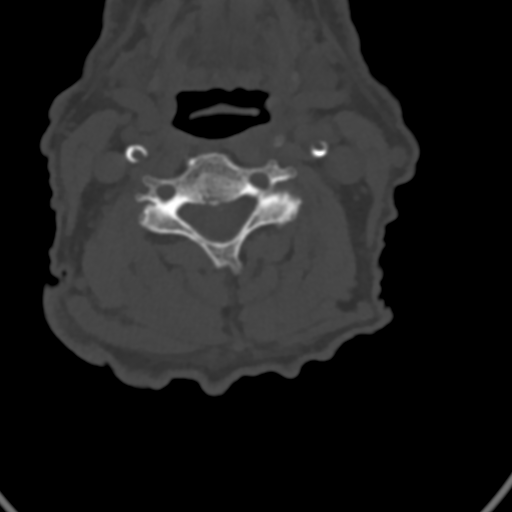
[im 316/421  bone]
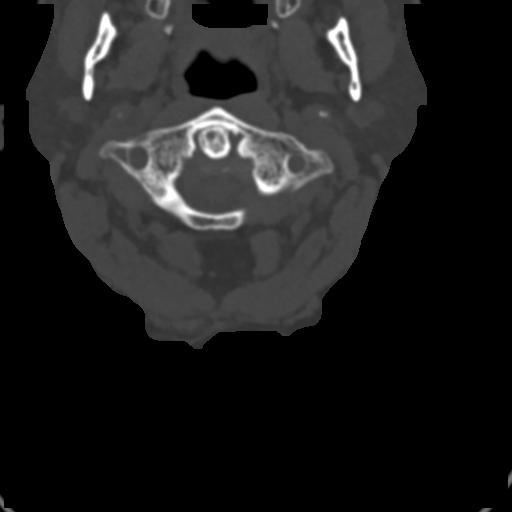

[Series 18: coronal bone · coronal · 0.31mm/px · 2 of 61 slices shown]
[im 16/61  bone]
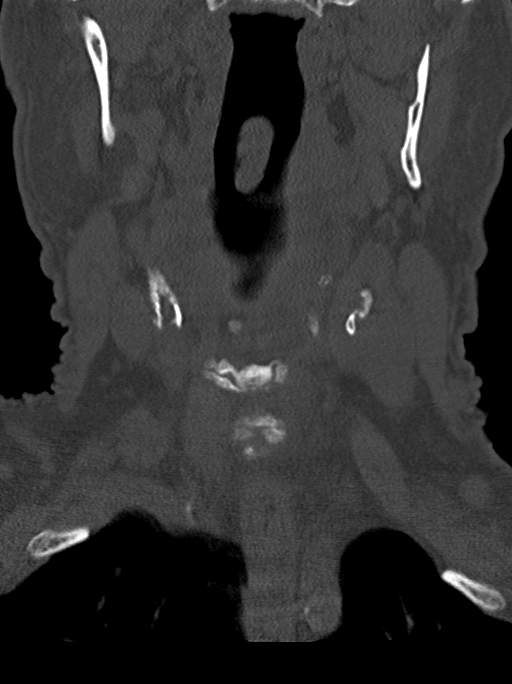
[im 32/61  bone]
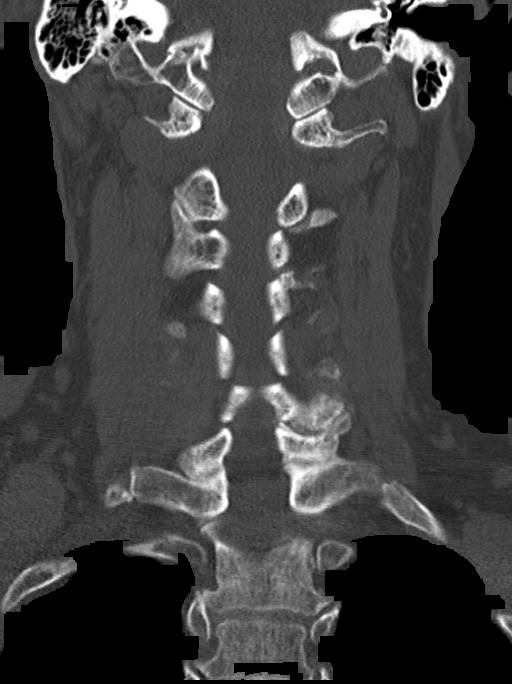

[12 of 33 positions shown; findings below may reference images not displayed]

FINDINGS: Alignment: Straightening of cervical lordosis with mild degenerative
appearing anterolisthesis at C3-C4, associated with left side
chronic facet degeneration. Cervicothoracic junction alignment is
within normal limits. Bilateral posterior element alignment is
within normal limits.

Skull base and vertebrae: Visualized skull base is intact. No
atlanto-occipital dissociation. C1 and C2 appear intact and normally
aligned. No acute osseous abnormality identified.

Soft tissues and spinal canal: No prevertebral fluid or swelling. No
visible canal hematoma. Severe bilateral cervical carotid and distal
vertebral artery calcified atherosclerosis.

Disc levels: Advanced cervical facet arthropathy is widespread on
the left. Vacuum facet at C2-C3 and C3-C4. Chronic disc and endplate
degeneration maximal at C5-C6. Multilevel mild degenerative spinal
stenosis.

Upper chest: Mild chronic appearing T2 and T3 superior endplate
compression. Other visible upper thoracic levels appear intact.
Partially visible sternotomy. Negative lung apices.
IMPRESSION: 1. No acute traumatic injury identified in the cervical spine.
2. Mild superior endplate compression at T2 and T3 is age
indeterminate but probably chronic.
3. Advanced cervical spine degeneration with multilevel degenerative
spinal stenosis.
4. Severe calcified atherosclerosis of the cervical carotids and
distal vertebral arteries.

## 2022-02-12 DIAGNOSIS — M109 Gout, unspecified: Secondary | ICD-10-CM | POA: Diagnosis not present

## 2022-02-12 DIAGNOSIS — E785 Hyperlipidemia, unspecified: Secondary | ICD-10-CM | POA: Diagnosis not present

## 2022-02-12 DIAGNOSIS — I110 Hypertensive heart disease with heart failure: Secondary | ICD-10-CM | POA: Diagnosis not present

## 2022-02-12 DIAGNOSIS — E669 Obesity, unspecified: Secondary | ICD-10-CM | POA: Diagnosis not present

## 2022-02-12 DIAGNOSIS — I252 Old myocardial infarction: Secondary | ICD-10-CM | POA: Diagnosis not present

## 2022-02-12 DIAGNOSIS — E1142 Type 2 diabetes mellitus with diabetic polyneuropathy: Secondary | ICD-10-CM | POA: Diagnosis not present

## 2022-02-12 DIAGNOSIS — E1122 Type 2 diabetes mellitus with diabetic chronic kidney disease: Secondary | ICD-10-CM | POA: Diagnosis not present

## 2022-02-12 DIAGNOSIS — I509 Heart failure, unspecified: Secondary | ICD-10-CM | POA: Diagnosis not present

## 2022-02-12 DIAGNOSIS — I25119 Atherosclerotic heart disease of native coronary artery with unspecified angina pectoris: Secondary | ICD-10-CM | POA: Diagnosis not present

## 2022-02-17 ENCOUNTER — Encounter: Payer: Self-pay | Admitting: Medical

## 2022-02-17 ENCOUNTER — Other Ambulatory Visit: Payer: Self-pay | Admitting: Medical

## 2022-02-17 ENCOUNTER — Ambulatory Visit: Payer: Medicare PPO | Admitting: Medical

## 2022-02-17 VITALS — BP 189/86 | HR 61 | Ht 69.0 in | Wt 182.0 lb

## 2022-02-17 DIAGNOSIS — I130 Hypertensive heart and chronic kidney disease with heart failure and stage 1 through stage 4 chronic kidney disease, or unspecified chronic kidney disease: Secondary | ICD-10-CM | POA: Diagnosis not present

## 2022-02-17 DIAGNOSIS — I5022 Chronic systolic (congestive) heart failure: Secondary | ICD-10-CM | POA: Diagnosis not present

## 2022-02-17 DIAGNOSIS — I10 Essential (primary) hypertension: Secondary | ICD-10-CM

## 2022-02-17 DIAGNOSIS — N182 Chronic kidney disease, stage 2 (mild): Secondary | ICD-10-CM | POA: Diagnosis not present

## 2022-02-17 DIAGNOSIS — I2582 Chronic total occlusion of coronary artery: Secondary | ICD-10-CM | POA: Diagnosis not present

## 2022-02-17 DIAGNOSIS — I25118 Atherosclerotic heart disease of native coronary artery with other forms of angina pectoris: Secondary | ICD-10-CM | POA: Diagnosis not present

## 2022-02-17 DIAGNOSIS — R9431 Abnormal electrocardiogram [ECG] [EKG]: Secondary | ICD-10-CM | POA: Diagnosis not present

## 2022-02-17 DIAGNOSIS — I4589 Other specified conduction disorders: Secondary | ICD-10-CM

## 2022-02-17 DIAGNOSIS — E782 Mixed hyperlipidemia: Secondary | ICD-10-CM | POA: Diagnosis not present

## 2022-02-17 DIAGNOSIS — I208 Other forms of angina pectoris: Secondary | ICD-10-CM | POA: Diagnosis not present

## 2022-02-17 DIAGNOSIS — I2089 Other forms of angina pectoris: Secondary | ICD-10-CM

## 2022-02-17 DIAGNOSIS — I4510 Unspecified right bundle-branch block: Secondary | ICD-10-CM

## 2022-02-17 MED ORDER — NITROGLYCERIN 0.4 MG SL SUBL: 0.4000 mg | SUBLINGUAL_TABLET | SUBLINGUAL | 3 refills | Status: DC | PRN

## 2022-02-17 MED ORDER — RANOLAZINE ER 1000 MG PO TB12: 1000.0000 mg | ORAL_TABLET | Freq: Two times a day (BID) | ORAL | 1 refills | Status: DC

## 2022-02-17 NOTE — Patient Instructions (Addendum)
Medication Instructions:  - Your physician has recommended you make the following change in your medication:   1) INCREASE Ranexa to 1000 mg: - take 1 tablet by mouth TWICE daily (or every 12 hours)  2) INCREASE Lasix (furosemide) 20 mg: - take 1 tablet by mouth TWICE daily (early AM & again between 12 pm-2 pm) x 3 days, then - resume 1 tablet by mouth ONCE daily  3) Nitroglycerin has been refilled to the pharmacy  *If you need a refill on your cardiac medications before your next appointment, please call your pharmacy*   Lab Work: - Your physician recommends that you have lab work today:   BMP/ CBC/ BNP  Medical Mall Entrance at [facility] 1st desk on the right to check in (REGISTRATION)  Lab hours: Monday- Friday (7:30 am- 5:30 pm)   If you have labs (blood work) drawn today and your tests are completely normal, you will receive your results only by: MyChart Message (if you have MyChart) OR A paper copy in the mail If you have any lab test that is abnormal or we need to change your treatment, we will call you to review the results.   Testing/Procedures:  1) You have been referred to : Dr. Eldridge Dace in our [place] office for consideration of CTO procedure.   If you cannot make this appointment, please call the office at [phone] to cancel or reschedule   2) Echocardiogram: - Your physician has requested that you have an echocardiogram. Echocardiography is a painless test that uses sound waves to create images of your heart. It provides your doctor with information about the size and shape of your heart and how well your heart's chambers and valves are working. This procedure takes approximately one hour. There are no restrictions for this procedure. There is a possibility that an IV may need to be started during your test to inject an image enhancing agent. This is done to obtain more optimal pictures of your heart. Therefore we ask that you do at least drink some water  prior to coming in to hydrate your veins.    Follow-Up: At [facility], you and your health needs are our priority.  As part of our continuing mission to provide you with exceptional heart care, we have created designated Provider Care Teams.  These Care Teams include your primary Cardiologist (physician) and Advanced Practice Providers (APPs -  Physician Assistants and Nurse Practitioners) who all work together to provide you with the care you need, when you need it.  We recommend signing up for the patient portal called "MyChart".  Sign up information is provided on this After Visit Summary.  MyChart is used to connect with patients for Virtual Visits (Telemedicine).  Patients are able to view lab/test results, encounter notes, upcoming appointments, etc.  Non-urgent messages can be sent to your provider as well.   To learn more about what you can do with MyChart, go to [email or web address].    Your next appointment:   6 week(s)  The format for your next appointment:   In Person  Provider:   You may see Julien Nordmann, MD or one of the following Advanced Practice Providers on your designated Care Team:    Cadence Fransico Michael, [place]    Other Instructions  Echocardiogram An echocardiogram is a test that uses sound waves (ultrasound) to produce images of the heart. Images from an echocardiogram can provide important information about: Heart size and shape. The size and thickness and movement of your  heart's walls. Heart muscle function and strength. Heart valve function or if you have stenosis. Stenosis is when the heart valves are too narrow. If blood is flowing backward through the heart valves (regurgitation). A tumor or infectious growth around the heart valves. Areas of heart muscle that are not working well because of poor blood flow or injury from a heart attack. Aneurysm detection. An aneurysm is a weak or damaged part of an artery wall. The wall bulges out from the normal  force of blood pumping through the body. Tell a health care provider about: Any allergies you have. All medicines you are taking, including vitamins, herbs, eye drops, creams, and over-the-counter medicines. Any blood disorders you have. Any surgeries you have had. Any medical conditions you have. Whether you are pregnant or may be pregnant. What are the risks? Generally, this is a safe test. However, problems may occur, including an allergic reaction to dye (contrast) that may be used during the test. What happens before the test? No specific preparation is needed. You may eat and drink normally. What happens during the test?  You will take off your clothes from the waist up and put on a hospital gown. Electrodes or electrocardiogram (ECG)patches may be placed on your chest. The electrodes or patches are then connected to a device that monitors your heart rate and rhythm. You will lie down on a table for an ultrasound exam. A gel will be applied to your chest to help sound waves pass through your skin. A handheld device, called a transducer, will be pressed against your chest and moved over your heart. The transducer produces sound waves that travel to your heart and bounce back (or "echo" back) to the transducer. These sound waves will be captured in real-time and changed into images of your heart that can be viewed on a video monitor. The images will be recorded on a computer and reviewed by your health care provider. You may be asked to change positions or hold your breath for a short time. This makes it easier to get different views or better views of your heart. In some cases, you may receive contrast through an IV in one of your veins. This can improve the quality of the pictures from your heart. The procedure may vary among health care providers and hospitals. What can I expect after the test? You may return to your normal, everyday life, including diet, activities, and medicines,  unless your health care provider tells you not to do that. Follow these instructions at home: It is up to you to get the results of your test. Ask your health care provider, or the department that is doing the test, when your results will be ready. Keep all follow-up visits. This is important. Summary An echocardiogram is a test that uses sound waves (ultrasound) to produce images of the heart. Images from an echocardiogram can provide important information about the size and shape of your heart, heart muscle function, heart valve function, and other possible heart problems. You do not need to do anything to prepare before this test. You may eat and drink normally. After the echocardiogram is completed, you may return to your normal, everyday life, unless your health care provider tells you not to do that. This information is not intended to replace advice given to you by your health care provider. Make sure you discuss any questions you have with your health care provider. Document Revised: 03/27/2021 Document Reviewed: 03/06/2020 Elsevier Patient Education  2023 Elsevier  Inc.   Important Information About Sugar

## 2022-02-17 NOTE — Progress Notes (Signed)
Cardiology Office Note:    Date:  02/17/2022   ID:  [name] Gross Sr., DOB 10-17-1940, MRN [id number]  PCP:  [name], [name]  [facility] Cardiologist:  [name], MD  [place] Electrophysiologist:  None   Referring MD: [name], [name]   Chief Complaint: 4 month follow-up  History of Present Illness:    [name] Lower Brashear Sr. is a 81 y.o. male with a hx of  with a hx of  with a hx of  with a hx of CAD s/p CABG (2004), HFimppEF, hypertension, hyperlipidemia, CKD 3, DM 2 who presents for follow-up cardiac cath.     H/o CAD s/p CABG x4 in 2004 with subsequent catheterization in 2007 revealing 3 and 4 patent grafts (occluded vein graft to the diagonal). Recent medication changes include addition of Cialis and discontinuation of Imdur (given interaction between these two mediations). Coreg has also been discontinued with patient previously on Coreg 6.37m BID but no longer taking for unclear reasons at this time.    Myoview 07/2019 was abnormal, high risk test with possible ischemia, LVEF 43%.  Echo 07/2019 showed LVEF 50-55%, G2DD, WMA, mildly dilated LA, moderately elevated pulmonary artery pressure.      Seen 05/16/21 and reported chest pain. Imdur was increased to 90 mg daily.    Seen 09/06/21 and reported chest pain and was still having exertional chest pain. A myoview lexiscan was ordered, this showed ischemia in the inferior wall with perfusion defect, moderate reversible in the inferior wall and apical region, EF 43%, moderate risk scan. Patient was brought in to discuss cardiac catheterization. HE was seen 09/19/21 and was set up for outpatient cardiac catheterization.    Cath showed multivessel coronary artery disease including severe diffuse LAD and left circumflex disease.  There is also a short, chronic total occlusion of the mid RCA with bridging with left-to-right collaterals.  Widely patent LIMA to LAD and sequential SVG to OM to OM 2.   Chronically occluded SVG to D1.  Normal LVEF with normal filling pressures.  Unsuccessful attempted PCI to mid RCA due to inability to cross the lesion using run-through and Fielder XT wires.  Recommendation for escalation of antianginal therapy, Imdur was increased to 120 mg daily.  If the patient has refractory lifestyle limiting symptoms may need referral to CTO team. Appointment was a no-show.   He was last seen 11/18/21 and reported rare chest pain. He reported he was not interested in seeing the CTO specialist, however does not appear this occurred.   Today, the patient reports persistent shortness of breath and chest pain. This happens when he is working hard. He is very active and does strenuous jobs at times outside. He will use SL NTG and pain resolves and he continues to work. He last took NTG 2 weeks ago. BP is high, although he did not have his medications this morning. At home BP is normal, he has a nurse come out weekly and check BP. HE has moderate LLE on exam. No orthopnea or pnd.  He takes lasix daily.   Past Medical History:  Diagnosis Date   Arthritis    Back pain    hx   CAD (coronary artery disease)    a. 2004 s/p CABG x 4 (LIMA->LAD, VG->Diag, VG->OM, VG->RCA); b. 2007 Cath: VG->Diag occluded, otw 3/4 patent grafts.   CKD (chronic kidney disease), stage II-III    Colon polyps    DM2 (diabetes mellitus, type 2) ([place]  stable   GERD (gastroesophageal reflux disease)    Glaucoma    Gout    HLD (hyperlipidemia)    HTN (hypertension)    Neuropathy     Past Surgical History:  Procedure Laterality Date   BACK SURGERY     Cyst removed   CORONARY ARTERY BYPASS GRAFT  2004   CORONARY STENT INTERVENTION N/A 09/24/2021   Procedure: CORONARY STENT INTERVENTION;  Surgeon: [name], MD;  Location: [place] [facility];  Service: Cardiovascular;  Laterality: N/A;   KNEE SURGERY     LEFT HEART CATH AND CORONARY ANGIOGRAPHY Left 09/24/2021   Procedure: LEFT HEART CATH  AND CORONARY ANGIOGRAPHY;  Surgeon: [name], MD;  Location: [place] [facility];  Service: Cardiovascular;  Laterality: Left;    Current Medications: Current Meds  Medication Sig   ACCU-CHEK AVIVA PLUS test strip USE AS DIRECTED TO CHECK FASTING BLOOD SUGAR EVERY MORNING   allopurinol (ZYLOPRIM) 300 MG tablet TAKE 1 TABLET BY MOUTH ONCE DAILY   aspirin 81 MG chewable tablet Chew 81 mg by mouth daily.   atorvastatin (LIPITOR) 20 MG tablet TAKE 1 TABLET BY MOUTH ONCE DAILY   BD SHARPS CONTAINER HOME MISC Dispose of syringes after B12 injections   Blood Glucose Monitoring Suppl (ACCU-CHEK AVIVA PLUS) w/Device KIT To check blood sugar once daily   carvedilol (COREG) 6.25 MG tablet Take 1 tablet (6.25 mg total) by mouth 2 (two) times daily.   dapagliflozin propanediol (FARXIGA) 10 MG TABS tablet Take 1 tablet (10 mg total) by mouth daily. Please schedule office visit before any future refills.   furosemide (LASIX) 20 MG tablet TAKE 1 TABLET BY MOUTH AS DIRECTED. TAKE1 TABLET TWICE DAILY 3 DAYS PER WEEK AND1 TABLET ONCE DAILY ON OTHER DAYS.   gabapentin (NEURONTIN) 400 MG capsule TAKE 1 CAPSULE BY MOUTH 3 TIMES A DAY   glipiZIDE (GLUCOTROL XL) 10 MG 24 hr tablet Take 1 tablet (10 mg total) by mouth daily with breakfast.   isosorbide mononitrate (IMDUR) 120 MG 24 hr tablet Take 1 tablet (120 mg total) by mouth daily.   lisinopril (ZESTRIL) 20 MG tablet TAKE 1 TABLET BY MOUTH ONCE DAILY   meloxicam (MOBIC) 7.5 MG tablet TAKE 1 TABLET BY MOUTH TWICE DAILY   metFORMIN (GLUCOPHAGE) 1000 MG tablet TAKE 1 TABLET BY MOUTH TWICE A DAY WITH A MEAL   nitroGLYCERIN (NITROSTAT) 0.4 MG SL tablet Place 1 tablet (0.4 mg total) under the tongue every 5 (five) minutes as needed for chest pain.   potassium chloride (KLOR-CON) 10 MEQ tablet TAKE 1 TABLET BY MOUTH ONCE DAILY   ranolazine (RANEXA) 500 MG 12 hr tablet Take 1 tablet (500 mg total) by mouth 2 (two) times daily.   sitaGLIPtin (JANUVIA) 100 MG  tablet Take 1 tablet (100 mg total) by mouth daily.   SYRINGE-NEEDLE, DISP, 3 ML (B-D SYRINGE/NEEDLE 3CC/25GX5/8) 25G X 5/8" 3 ML MISC For use with cyanocobalamin injections   tamsulosin (FLOMAX) 0.4 MG CAPS capsule Take 1 capsule (0.4 mg total) by mouth daily.   Current Facility-Administered Medications for the 02/17/22 encounter (Office Visit) with Kathlen Mody, Jonan Seufert H, [name]  Medication   sodium chloride flush (NS) 0.9 % injection 3 mL     Allergies:   Ramipril   Social History   Socioeconomic History   Marital status: Widowed    Spouse name: Not on file   Number of children: 3   Years of education: Not on file   Highest education level: 8th grade  Occupational History   Not on file  Tobacco Use   Smoking status: Never   Smokeless tobacco: Current    Types: Chew   Tobacco comments:    chew tobacco - 1 pack last 3 weeks  Vaping Use   Vaping Use: Never used  Substance and Sexual Activity   Alcohol use: No    Alcohol/week: 0.0 standard drinks of alcohol   Drug use: No   Sexual activity: Not Currently  Other Topics Concern   Not on file  Social History Narrative   Not on file   Social Determinants of Health   Financial Resource Strain: Low Risk  (03/18/2018)   Overall Financial Resource Strain (CARDIA)    Difficulty of Paying Living Expenses: Not hard at all  Food Insecurity: No Food Insecurity (03/18/2018)   Hunger Vital Sign    Worried About Running Out of Food in the Last Year: Never true    Ran Out of Food in the Last Year: Never true  Transportation Needs: No Transportation Needs (03/18/2018)   PRAPARE - Hydrologist (Medical): No    Lack of Transportation (Non-Medical): No  Physical Activity: Not on file  Stress: No Stress Concern Present (03/18/2018)   [place]    Feeling of Stress : Not at all  Social Connections: Unknown (06/23/2017)   Social Connection and Isolation  Panel [NHANES]    Frequency of Communication with Friends and Family: Not on file    Frequency of Social Gatherings with Friends and Family: Not on file    Attends Religious Services: Not on file    Active Member of Clubs or Organizations: Not on file    Attends Archivist Meetings: Not on file    Marital Status: Widowed     Family History: The patient's Family history is unknown by patient.  ROS:   Please see the history of present illness.     All other systems reviewed and are negative.  EKGs/Labs/Other Studies Reviewed:    The following studies were reviewed today:    Cardiac cath 09/24/21   Conclusions: Multivessel coronary artery disease including severe diffuse LAD and LCx disease.  There is also a short, chronic total occlusion of the mid RCA with bridging and left-right collaterals. Widely patent LIMA-LAD and sequential SVG-OM1-OM2. Chronically occluded SVG-D1. Normal left ventricular contraction (LVEF 55-65%) with normal filling pressure (LVEDP 10 mmHg). Unsuccessful attempted PCI to mid RCA due to inability to cross the lesion using Runthrough and Fielder XT wires (including with balloon support).   Recommendations: Escalate antianginal therapy.  I will increase isosorbide mononitrate to 120 mg daily.  If the patient has refractory lifestyle-limiting angina despite maximal tolerated antianginal therapy, referral to our CTO team for possible repeat intervention to the mid RCA could be considered. Aggressive secondary prevention of coronary artery disease.   [name], MD [facility]   Antiplatelet/Anticoag Continue aspirin 81 mg daily.  Defer adding P2Y12 inhibitor in the setting of unsuccessful PCI today.  Discharge Date In the absence of any other complications or medical issues, we expect the patient to be ready for discharge from an interventional cardiology perspective on 09/24/2021.      Coronary Diagrams   Diagnostic Dominance:  Right Intervention        EKG:  EKG is  ordered today.  The ekg ordered today demonstrates NSR 61bpm, RBBB, nonspecific T wave changes  Recent Labs: 09/19/2021: BUN 12; Creatinine,  Ser 1.17; Potassium 4.8; Sodium 140 11/22/2021: ALT 18; Hemoglobin 11.2; Platelets 250  Recent Lipid Panel    Component Value Date/Time   CHOL 114 05/16/2021 0923   TRIG 104 05/16/2021 0923   TRIG 136 02/28/2008 0000   HDL 35 (L) 05/16/2021 0923   CHOLHDL 3.3 05/16/2021 0923   LDLCALC 59 05/16/2021 0923     Physical Exam:    VS:  There were no vitals taken for this visit.    Wt Readings from Last 3 Encounters:  12/12/21 169 lb (76.7 kg)  11/28/21 169 lb 12.8 oz (77 kg)  11/22/21 171 lb 14.4 oz (78 kg)     GEN:  Well nourished, well developed in no acute distress HEENT: Normal NECK: No JVD; No carotid bruits LYMPHATICS: No lymphadenopathy CARDIAC: RRR, + murmur,no rubs, gallops RESPIRATORY:  Clear to auscultation without rales, wheezing or rhonchi  ABDOMEN: Soft, non-tender, non-distended MUSCULOSKELETAL:  1+ lower leg edema; No deformity  SKIN: Warm and dry NEUROLOGIC:  Alert and oriented x 3 PSYCHIATRIC:  Normal affect   ASSESSMENT:    No diagnosis found. PLAN:    In order of problems listed above:  Chest pain and shortness of breath CAD s/p remote CABG in 2004 He reports persistent exertional chest pain and SOB, he uses SL NTG intermittently. He is on Coreg 6.17mBID, Imdur 1215mdaily and Ranexa 50070mID. EKG with no significant changes. I will refill SL NTG. Cannot increase BB with HR 61bpm. I will check CBC, BMET and BNP. I will increase Ranexa to 1000m19m. I will also check an echocardiogram. There has been multiple discussions regarding CTO specialist of the mRCA for refractory symptoms. HE was previously referred, but this appointment was a no-show. He said he is willing to go, so we will repeat this referral. Continue Aspirin 81mg36mly and statin therapy.    LLE HFimpEF He has moderate LLE on exam. He reports low salt diet. He takes lasix 20mg 45my, occasionally takes 2 during the week.  On the cath earlier this year LVEF was normal. I will re-check labs and repeat an echo as above. We will increase lasix to 20mg B17m 3 days, then back down to 20mg da60m Can consider of spironolactone in the future. Continue Coreg, Jardiance, and lisinopril.   HTN BP is high, but he did not have his medications today. He said a nurse will come occasionally and check it and it's normal generally. Continue current medications.   CKD stage 3 BMET today  DM2 A1C 8.3. Followed by PCP.  HLD LDL 59 04/2021. Continue statin therapy.  Disposition: Follow up in 1-2 month(s) with MD/APP    Signed, Yanel Dombrosky H Julie Paolini,Ninfa Meeker7/24/2023 7:58 AM    [facility]

## 2022-02-18 LAB — BASIC METABOLIC PANEL
BUN/Creatinine Ratio: 10 (ref 10–24)
BUN: 14 mg/dL (ref 8–27)
CO2: 22 mmol/L (ref 20–29)
Calcium: 8.9 mg/dL (ref 8.6–10.2)
Chloride: 107 mmol/L — ABNORMAL HIGH (ref 96–106)
Creatinine, Ser: 1.38 mg/dL — ABNORMAL HIGH (ref 0.76–1.27)
Glucose: 178 mg/dL — ABNORMAL HIGH (ref 70–99)
Potassium: 4.3 mmol/L (ref 3.5–5.2)
Sodium: 132 mmol/L — ABNORMAL LOW (ref 134–144)
eGFR: 52 mL/min/{1.73_m2} — ABNORMAL LOW (ref >59)

## 2022-02-18 LAB — BRAIN NATRIURETIC PEPTIDE: BNP: 621.9 pg/mL — ABNORMAL HIGH (ref 0.0–100.0)

## 2022-02-18 LAB — CBC
Hematocrit: 30.9 % — ABNORMAL LOW (ref 37.5–51.0)
Hemoglobin: 9.7 g/dL — ABNORMAL LOW (ref 13.0–17.7)
MCH: 25.2 pg — ABNORMAL LOW (ref 26.6–33.0)
MCHC: 31.4 g/dL — ABNORMAL LOW (ref 31.5–35.7)
MCV: 80 fL (ref 79–97)
Platelets: 187 10*3/uL (ref 150–450)
RBC: 3.85 x10E6/uL — ABNORMAL LOW (ref 4.14–5.80)
RDW: 15.2 % (ref 11.6–15.4)
WBC: 6.7 10*3/uL (ref 3.4–10.8)

## 2022-02-18 NOTE — Addendum Note (Signed)
Addended by: Auburn Bilberry D on: 02/18/2022 02:59 PM   Modules accepted: Orders

## 2022-02-19 ENCOUNTER — Telehealth: Payer: Self-pay | Admitting: Medical

## 2022-02-19 ENCOUNTER — Ambulatory Visit (INDEPENDENT_AMBULATORY_CARE_PROVIDER_SITE_OTHER): Payer: Medicare PPO

## 2022-02-19 DIAGNOSIS — I5022 Chronic systolic (congestive) heart failure: Secondary | ICD-10-CM

## 2022-02-19 LAB — ECHOCARDIOGRAM COMPLETE
AR max vel: 2.25 cm2
AV Area VTI: 2.13 cm2
AV Area mean vel: 2.17 cm2
AV Mean grad: 5 mmHg
AV Peak grad: 10.4 mmHg
Ao pk vel: 1.61 m/s
Area-P 1/2: 3.85 cm2
Calc EF: 54.7 %
S' Lateral: 4.2 cm
Single Plane A2C EF: 56.4 %
Single Plane A4C EF: 56.7 %

## 2022-02-19 NOTE — Telephone Encounter (Signed)
Patient seen in office on 02/17/22 with [name], [place].   Referral placed to Dr. Eldridge Dace for consideration of CTO.  I reached out to [facility] and was given an appointment for the patient to come on:  Tuesday 03/11/22 @ 1:00 pm [address]  [place]  I have called the patient and notifed him of the appointment. He advised he was not familiar with [facility] and asked that I reach out to his son, [name].  I called [name] and was able to advise him the appointment with Dr. Eldridge Dace has been scheduled- per [name], he was driving at the time and could not take down the information. I advised I would call him right back and leave a voice mail of the date/ time/ location of the appointment. Per [name], that was fine- he was very appreciative of the call.   Called and left [name] a detailed voice mail message of the above appointment details.  I asked that they call back with any further questions/ concerns.

## 2022-02-20 ENCOUNTER — Telehealth: Payer: Self-pay

## 2022-02-20 NOTE — Telephone Encounter (Signed)
-----   Message from Cadence David Stall, [name] sent at 02/19/2022  1:51 PM EDT ----- Labs showed Hgb was down 1-2 points. Is he having any bleeding? Recommend PCP follow this.  Labs show he is volume overloaded, so I would take stay taking lasix 40mg  daily. Can we re-checl BMET in 2 weeks?

## 2022-02-20 NOTE — Telephone Encounter (Signed)
Called to give the patient lab results. Lmtcb.  

## 2022-02-21 NOTE — Telephone Encounter (Deleted)
-----   Message from Cadence H Furth, [name] sent at 02/19/2022  1:51 PM EDT ----- Labs showed Hgb was down 1-2 points. Is he having any bleeding? Recommend PCP follow this.  Labs show he is volume overloaded, so I would take stay taking lasix 40mg daily. Can we re-checl BMET in 2 weeks? 

## 2022-02-21 NOTE — Telephone Encounter (Addendum)
2nd attempt to contact the patient with lab results. 1st attempt to contact the patient with echo results. Lmtcb.

## 2022-02-21 NOTE — Telephone Encounter (Signed)
[name] David Stall, [name]  02/21/2022 10:02 AM EDT Back to Top    Echo showed LVEF 50-55%, mild LVH, G3DD, mildly leaky valve, mild to moderate TR, borderline aortic root dilation. Mildly worsening diastolic dysfunction

## 2022-02-24 NOTE — Progress Notes (Unsigned)
I,Sha'taria Tyson,acting as a Education administrator for [employer], [name].,have documented all relevant documentation on the behalf of [name], [name],as directed by  [name], [name] while in the presence of [name], [name].   Established patient visit   Patient: [name] Regnier Sr.   DOB: [birth year]   81 y.o. Male  MRN: [id number] Visit Date: 02/25/2022  Today's healthcare provider: [name], [name]   No chief complaint on file.  Subjective    HPI  Diabetes Mellitus Type II, Follow-up  Lab Results  Component Value Date   HGBA1C 8.3 (H) 11/22/2021   HGBA1C 7.4 (A) 08/26/2021   HGBA1C 7.7 (A) 01/30/2021   Wt Readings from Last 3 Encounters:  02/17/22 182 lb (82.6 kg)  12/12/21 169 lb (76.7 kg)  11/28/21 169 lb 12.8 oz (77 kg)   Last seen for diabetes 3 months ago.  Management since then includes double his januvia from 50 mg to 100 mg daily. He reports {excellent/good/fair/poor:19665} compliance with treatment. He {is/is not:21021397} having side effects. {document side effects if present:1} Symptoms: {Yes/No:20286} fatigue {Yes/No:20286} foot ulcerations  {Yes/No:20286} appetite changes {Yes/No:20286} nausea  {Yes/No:20286} paresthesia of the feet  {Yes/No:20286} polydipsia  {Yes/No:20286} polyuria {Yes/No:20286} visual disturbances   {Yes/No:20286} vomiting     Home blood sugar records: {diabetes glucometry results:16657}  Episodes of hypoglycemia? {Yes/No:20286} {enter symptoms and frequency of symptoms if yes:1}   Current insulin regiment: {enter 'none' or type of insulin and number of units taken with each dose of each insulin formulation that the patient is taking:1} Most Recent Eye Exam: *** {Current exercise:16438:::1} {Current diet habits:16563:::1}  Pertinent Labs: Lab Results  Component Value Date   CHOL 114 05/16/2021   HDL 35 (L) 05/16/2021   LDLCALC 59 05/16/2021   TRIG 104 05/16/2021   CHOLHDL 3.3 05/16/2021   Lab Results   Component Value Date   NA 132 (L) 02/17/2022   K 4.3 02/17/2022   CREATININE 1.38 (H) 02/17/2022   EGFR 52 (L) 02/17/2022   MICROALBUR 50 07/20/2017   LABMICR <3.0 11/22/2021     ---------------------------------------------------------------------------------------------------   Medications: Outpatient Medications Prior to Visit  Medication Sig   ACCU-CHEK AVIVA PLUS test strip USE AS DIRECTED TO CHECK FASTING BLOOD SUGAR EVERY MORNING   allopurinol (ZYLOPRIM) 300 MG tablet TAKE 1 TABLET BY MOUTH ONCE DAILY   aspirin 81 MG chewable tablet Chew 81 mg by mouth daily.   atorvastatin (LIPITOR) 20 MG tablet TAKE 1 TABLET BY MOUTH ONCE DAILY   BD SHARPS CONTAINER HOME MISC Dispose of syringes after B12 injections   Blood Glucose Monitoring Suppl (ACCU-CHEK AVIVA PLUS) w/Device KIT To check blood sugar once daily   carvedilol (COREG) 6.25 MG tablet Take 1 tablet (6.25 mg total) by mouth 2 (two) times daily.   dapagliflozin propanediol (FARXIGA) 10 MG TABS tablet Take 1 tablet (10 mg total) by mouth daily. Please schedule office visit before any future refills.   furosemide (LASIX) 20 MG tablet TAKE 1 TABLET BY MOUTH AS DIRECTED. TAKE1 TABLET TWICE DAILY 3 DAYS PER WEEK AND1 TABLET ONCE DAILY ON OTHER DAYS.   gabapentin (NEURONTIN) 400 MG capsule TAKE 1 CAPSULE BY MOUTH 3 TIMES A DAY   glipiZIDE (GLUCOTROL XL) 10 MG 24 hr tablet Take 1 tablet (10 mg total) by mouth daily with breakfast.   isosorbide mononitrate (IMDUR) 120 MG 24 hr tablet Take 1 tablet (120 mg total) by mouth daily.   lisinopril (ZESTRIL) 20 MG tablet TAKE 1 TABLET BY MOUTH  ONCE DAILY   meloxicam (MOBIC) 7.5 MG tablet TAKE 1 TABLET BY MOUTH TWICE DAILY   metFORMIN (GLUCOPHAGE) 1000 MG tablet TAKE 1 TABLET BY MOUTH TWICE A DAY WITH A MEAL   nitroGLYCERIN (NITROSTAT) 0.4 MG SL tablet Place 1 tablet (0.4 mg total) under the tongue every 5 (five) minutes as needed for chest pain.   potassium chloride (KLOR-CON) 10 MEQ tablet  TAKE 1 TABLET BY MOUTH ONCE DAILY   ranolazine (RANEXA) 1000 MG SR tablet Take 1 tablet (1,000 mg total) by mouth 2 (two) times daily.   sitaGLIPtin (JANUVIA) 100 MG tablet Take 1 tablet (100 mg total) by mouth daily.   SYRINGE-NEEDLE, DISP, 3 ML (B-D SYRINGE/NEEDLE 3CC/25GX5/8) 25G X 5/8" 3 ML MISC For use with cyanocobalamin injections   tamsulosin (FLOMAX) 0.4 MG CAPS capsule Take 1 capsule (0.4 mg total) by mouth daily.   Facility-Administered Medications Prior to Visit  Medication Dose Route Frequency Provider   sodium chloride flush (NS) 0.9 % injection 3 mL  3 mL Intravenous Q12H Furth, Cadence H, [name]    Review of Systems  {Labs  Heme  Chem  Endocrine  Serology  Results Review (optional):23779}   Objective    There were no vitals taken for this visit. {Show previous vital signs (optional):23777}  Physical Exam  ***  No results found for any visits on 02/25/22.  Assessment & Plan     ***  No follow-ups on file.      {provider attestation***:1}   [name], [name]  [facility] [phone] (phone) [phone] (fax)  [place]

## 2022-02-25 ENCOUNTER — Ambulatory Visit: Payer: Medicare PPO | Admitting: Physician Assistant

## 2022-02-25 ENCOUNTER — Encounter: Payer: Self-pay | Admitting: Physician Assistant

## 2022-02-25 VITALS — BP 185/78 | HR 63 | Ht 70.0 in | Wt 176.5 lb

## 2022-02-25 DIAGNOSIS — I5042 Chronic combined systolic (congestive) and diastolic (congestive) heart failure: Secondary | ICD-10-CM | POA: Insufficient documentation

## 2022-02-25 DIAGNOSIS — N401 Enlarged prostate with lower urinary tract symptoms: Secondary | ICD-10-CM | POA: Insufficient documentation

## 2022-02-25 DIAGNOSIS — E118 Type 2 diabetes mellitus with unspecified complications: Secondary | ICD-10-CM | POA: Diagnosis not present

## 2022-02-25 DIAGNOSIS — I152 Hypertension secondary to endocrine disorders: Secondary | ICD-10-CM

## 2022-02-25 DIAGNOSIS — I5022 Chronic systolic (congestive) heart failure: Secondary | ICD-10-CM | POA: Diagnosis not present

## 2022-02-25 DIAGNOSIS — D649 Anemia, unspecified: Secondary | ICD-10-CM | POA: Diagnosis not present

## 2022-02-25 DIAGNOSIS — E1159 Type 2 diabetes mellitus with other circulatory complications: Secondary | ICD-10-CM

## 2022-02-25 DIAGNOSIS — R3911 Hesitancy of micturition: Secondary | ICD-10-CM

## 2022-02-25 LAB — POCT URINALYSIS DIPSTICK
Bilirubin, UA: NEGATIVE
Blood, UA: NEGATIVE
Glucose, UA: NEGATIVE
Ketones, UA: NEGATIVE
Leukocytes, UA: NEGATIVE
Nitrite, UA: NEGATIVE
Protein, UA: NEGATIVE
Spec Grav, UA: <=1.005 — AB (ref 1.010–1.025)
Urobilinogen, UA: 0.2 E.U./dL
pH, UA: 6 (ref 5.0–8.0)

## 2022-02-25 MED ORDER — TAMSULOSIN HCL 0.4 MG PO CAPS: 0.4000 mg | ORAL_CAPSULE | Freq: Every day | ORAL | 1 refills | Status: DC

## 2022-02-25 MED ORDER — LISINOPRIL 20 MG PO TABS: 20.0000 mg | ORAL_TABLET | Freq: Every day | ORAL | 1 refills | Status: DC

## 2022-02-25 NOTE — Assessment & Plan Note (Signed)
Pt has been w/o lisinopril. Refilled.  He has a documented allergy to ramipril in chart but no explanation pt not aware and previously taken lisinopril w/o issue Reviewed last cmp

## 2022-02-25 NOTE — Assessment & Plan Note (Addendum)
Appears euvolemic today; follows with cardio Last cardio recs were to increase lasix to 40 mg daily and f/u with bmp in 2 weeks. This was not communicated w/ pt -- made him aware and will set up repeat labs

## 2022-02-25 NOTE — Assessment & Plan Note (Signed)
D/t urinary symptoms, UA ran but - leuk, blood. Unfortunately likely GI in origin despite asymptomatic I dont see a history of colonoscopies Repeat cbc in 2 weeks with other labs, if hgb lower or stable consider ref to GI after discussing w/ pt.

## 2022-02-25 NOTE — Assessment & Plan Note (Signed)
Stable symptoms Refilled flomax

## 2022-02-25 NOTE — Assessment & Plan Note (Signed)
From fasting sugars, improved. Concerned over episodes of hypoglyemia, while infrequent seem severe. If A1c <7.5% will consider lowering dose of glipizide. uacr utd, foot exam utd. Pt aware he needs to see optho On statin and ace I F/u 3 mo

## 2022-02-28 NOTE — Telephone Encounter (Signed)
Patient made aware of lab and echo results.  Pt sts that he has no signs of bleeding. He is suppose to have labs early next week with his pcp. He will continue lasix 40 mg daily. Adv the pt that a bmp can be done the same day he sees Dr. Eldridge Dace on 03/11/22 in [place]. Pt verbalized understanding.

## 2022-03-05 DIAGNOSIS — E118 Type 2 diabetes mellitus with unspecified complications: Secondary | ICD-10-CM | POA: Diagnosis not present

## 2022-03-05 DIAGNOSIS — D649 Anemia, unspecified: Secondary | ICD-10-CM | POA: Diagnosis not present

## 2022-03-06 ENCOUNTER — Telehealth: Payer: Self-pay | Admitting: *Deleted

## 2022-03-06 ENCOUNTER — Other Ambulatory Visit: Payer: Self-pay | Admitting: Physician Assistant

## 2022-03-06 DIAGNOSIS — D649 Anemia, unspecified: Secondary | ICD-10-CM

## 2022-03-06 DIAGNOSIS — E538 Deficiency of other specified B group vitamins: Secondary | ICD-10-CM

## 2022-03-06 DIAGNOSIS — E611 Iron deficiency: Secondary | ICD-10-CM

## 2022-03-06 LAB — CBC WITH DIFFERENTIAL/PLATELET
Basophils Absolute: 0.1 10*3/uL (ref 0.0–0.2)
Basos: 1 %
EOS (ABSOLUTE): 0.2 10*3/uL (ref 0.0–0.4)
Eos: 3 %
Hematocrit: 33.7 % — ABNORMAL LOW (ref 37.5–51.0)
Hemoglobin: 10.2 g/dL — ABNORMAL LOW (ref 13.0–17.7)
Immature Grans (Abs): 0 10*3/uL (ref 0.0–0.1)
Immature Granulocytes: 0 %
Lymphocytes Absolute: 2.4 10*3/uL (ref 0.7–3.1)
Lymphs: 32 %
MCH: 24.3 pg — ABNORMAL LOW (ref 26.6–33.0)
MCHC: 30.3 g/dL — ABNORMAL LOW (ref 31.5–35.7)
MCV: 80 fL (ref 79–97)
Monocytes Absolute: 0.7 10*3/uL (ref 0.1–0.9)
Monocytes: 9 %
Neutrophils Absolute: 4.1 10*3/uL (ref 1.4–7.0)
Neutrophils: 55 %
Platelets: 254 10*3/uL (ref 150–450)
RBC: 4.2 x10E6/uL (ref 4.14–5.80)
RDW: 15.5 % — ABNORMAL HIGH (ref 11.6–15.4)
WBC: 7.4 10*3/uL (ref 3.4–10.8)

## 2022-03-06 LAB — IRON,TIBC AND FERRITIN PANEL
Ferritin: 12 ng/mL — ABNORMAL LOW (ref 30–400)
Iron Saturation: 9 % — CL (ref 15–55)
Iron: 33 ug/dL — ABNORMAL LOW (ref 38–169)
Total Iron Binding Capacity: 349 ug/dL (ref 250–450)
UIBC: 316 ug/dL (ref 111–343)

## 2022-03-06 LAB — BASIC METABOLIC PANEL
BUN/Creatinine Ratio: 11 (ref 10–24)
BUN: 14 mg/dL (ref 8–27)
CO2: 20 mmol/L (ref 20–29)
Calcium: 9.3 mg/dL (ref 8.6–10.2)
Chloride: 101 mmol/L (ref 96–106)
Creatinine, Ser: 1.25 mg/dL (ref 0.76–1.27)
Glucose: 95 mg/dL (ref 70–99)
Potassium: 4.5 mmol/L (ref 3.5–5.2)
Sodium: 137 mmol/L (ref 134–144)
eGFR: 58 mL/min/{1.73_m2} — ABNORMAL LOW (ref >59)

## 2022-03-06 LAB — B12 AND FOLATE PANEL
Folate: >20 ng/mL (ref >3.0)
Vitamin B-12: 198 pg/mL — ABNORMAL LOW (ref 232–1245)

## 2022-03-06 LAB — HEMOGLOBIN A1C
Est. average glucose Bld gHb Est-mCnc: 169 mg/dL
Hgb A1c MFr Bld: 7.5 % — ABNORMAL HIGH (ref 4.8–5.6)

## 2022-03-06 NOTE — Telephone Encounter (Signed)
Error

## 2022-03-11 ENCOUNTER — Encounter: Payer: Self-pay | Admitting: Interventional Cardiology

## 2022-03-11 ENCOUNTER — Ambulatory Visit: Payer: Medicare PPO | Admitting: Interventional Cardiology

## 2022-03-11 VITALS — BP 112/48 | HR 70 | Ht 70.0 in | Wt 169.0 lb

## 2022-03-11 DIAGNOSIS — I25118 Atherosclerotic heart disease of native coronary artery with other forms of angina pectoris: Secondary | ICD-10-CM | POA: Diagnosis not present

## 2022-03-11 DIAGNOSIS — R600 Localized edema: Secondary | ICD-10-CM

## 2022-03-11 NOTE — Progress Notes (Signed)
Cardiology Office Note   Date:  03/11/2022   ID:  [name] Gross Sr., DOB 09-10-1940, MRN [id number]  PCP:  [name], [name]    Chief Complaint  Patient presents with   Consultation    CTO   CAD  Wt Readings from Last 3 Encounters:  03/11/22 169 lb (76.7 kg)  02/25/22 176 lb 8 oz (80.1 kg)  02/17/22 182 lb (82.6 kg)       History of Present Illness: [name] Lower Nielson Sr. is a 81 y.o. male with CAD.  He has an extensive cardiac history as noted below.  The active issue today is chronic total occlusion of the RCA.  Records show: " with a hx of  with a hx of  with a hx of  with a hx of CAD s/p CABG (2004), HFimppEF, hypertension, hyperlipidemia, CKD 3, DM 2 who presents for follow-up cardiac cath.     H/o CAD s/p CABG x4 in 2004 with subsequent catheterization in 2007 revealing 3 and 4 patent grafts (occluded vein graft to the diagonal). Recent medication changes include addition of Cialis and discontinuation of Imdur (given interaction between these two mediations). Coreg has also been discontinued with patient previously on Coreg 6.17m BID but no longer taking for unclear reasons at this time.    Myoview 07/2019 was abnormal, high risk test with possible ischemia, LVEF 43%.  Echo 07/2019 showed LVEF 50-55%, G2DD, WMA, mildly dilated LA, moderately elevated pulmonary artery pressure.      Seen 05/16/21 and reported chest pain. Imdur was increased to 90 mg daily.    Seen 09/06/21 and reported chest pain and was still having exertional chest pain. A myoview lexiscan was ordered, this showed ischemia in the inferior wall with perfusion defect, moderate reversible in the inferior wall and apical region, EF 43%, moderate risk scan. Patient was brought in to discuss cardiac catheterization. HE was seen 09/19/21 and was set up for outpatient cardiac catheterization.    Cath showed multivessel coronary artery disease including severe diffuse LAD and left circumflex  disease.  There is also a short, chronic total occlusion of the mid RCA with bridging with left-to-right collaterals.  Widely patent LIMA to LAD and sequential SVG to OM to OM 2.  Chronically occluded SVG to D1.  Normal LVEF with normal filling pressures.  Unsuccessful attempted PCI to mid RCA due to inability to cross the lesion using run-through and Fielder XT wires.  Recommendation for escalation of antianginal therapy, Imdur was increased to 120 mg daily.  If the patient has refractory lifestyle limiting symptoms may need referral to CTO team. Appointment was a no-show.    He was last seen 11/18/21 and reported rare chest pain. He reported he was not interested in seeing the CTO specialist, however does not appear this occurred.    Today (July 2023), the patient reports persistent shortness of breath and chest pain. This happens when he is working hard. He is very active and does strenuous jobs at times outside. He will use SL NTG and pain resolves and he continues to work. 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  Angina has decreased significantly since he increased Ranexa.  He has now cleaned the a truck with a pitchfork several times without any problems.  He has not had to use nitroglycerin recently.  He does note some mild unsteadiness.  Denies :  Chest pain. Dizziness. Leg edema. Nitroglycerin use. Orthopnea. Palpitations. Paroxysmal nocturnal dyspnea. Shortness of breath. Syncope.      Past Medical History:  Diagnosis Date   Arthritis    Back pain    hx   CAD (coronary artery disease)    a. 2004 s/p CABG x 4 (LIMA->LAD, VG->Diag, VG->OM, VG->RCA); b. 2007 Cath: VG->Diag occluded, otw 3/4 patent grafts.   CKD (chronic kidney disease), stage II-III    Colon polyps    DM2 (diabetes mellitus, type 2) ([place])    stable   GERD (gastroesophageal reflux disease)    Glaucoma    Gout    HLD  (hyperlipidemia)    HTN (hypertension)    Neuropathy     Past Surgical History:  Procedure Laterality Date   BACK SURGERY     Cyst removed   CORONARY ARTERY BYPASS GRAFT  2004   CORONARY STENT INTERVENTION N/A 09/24/2021   Procedure: CORONARY STENT INTERVENTION;  Surgeon: [name], MD;  Location: [place] [facility];  Service: Cardiovascular;  Laterality: N/A;   KNEE SURGERY     LEFT HEART CATH AND CORONARY ANGIOGRAPHY Left 09/24/2021   Procedure: LEFT HEART CATH AND CORONARY ANGIOGRAPHY;  Surgeon: [name], MD;  Location: [place] [facility];  Service: Cardiovascular;  Laterality: Left;     Current Outpatient Medications  Medication Sig Dispense Refill   ACCU-CHEK AVIVA PLUS test strip USE AS DIRECTED TO CHECK FASTING BLOOD SUGAR EVERY MORNING 100 each 4   allopurinol (ZYLOPRIM) 300 MG tablet TAKE 1 TABLET BY MOUTH ONCE DAILY 90 tablet 1   aspirin 81 MG chewable tablet Chew 81 mg by mouth daily.     atorvastatin (LIPITOR) 20 MG tablet TAKE 1 TABLET BY MOUTH ONCE DAILY 90 tablet 1   BD SHARPS CONTAINER HOME MISC Dispose of syringes after B12 injections 1 each 5   Blood Glucose Monitoring Suppl (ACCU-CHEK AVIVA PLUS) w/Device KIT To check blood sugar once daily 1 kit 0   carvedilol (COREG) 6.25 MG tablet Take 1 tablet (6.25 mg total) by mouth 2 (two) times daily. 180 tablet 0   dapagliflozin propanediol (FARXIGA) 10 MG TABS tablet Take 1 tablet (10 mg total) by mouth daily. Please schedule office visit before any future refills. 90 tablet 3   furosemide (LASIX) 20 MG tablet TAKE 1 TABLET BY MOUTH AS DIRECTED. TAKE1 TABLET TWICE DAILY 3 DAYS PER WEEK AND1 TABLET ONCE DAILY ON OTHER DAYS. 120 tablet 2   gabapentin (NEURONTIN) 400 MG capsule TAKE 1 CAPSULE BY MOUTH 3 TIMES A DAY 90 capsule 5   glipiZIDE (GLUCOTROL XL) 10 MG 24 hr tablet Take 1 tablet (10 mg total) by mouth daily with breakfast. 90 tablet 3   isosorbide mononitrate (IMDUR) 120 MG 24 hr tablet Take 1 tablet  (120 mg total) by mouth daily. 30 tablet 11   lisinopril (ZESTRIL) 20 MG tablet Take 1 tablet (20 mg total) by mouth daily. 90 tablet 1   meloxicam (MOBIC) 7.5 MG tablet TAKE 1 TABLET BY MOUTH TWICE DAILY 180 tablet 0   metFORMIN (GLUCOPHAGE) 1000 MG tablet TAKE 1 TABLET BY MOUTH TWICE A DAY WITH A MEAL 180 tablet 1   nitroGLYCERIN (NITROSTAT) 0.4 MG SL tablet Place 1 tablet (0.4 mg total) under the tongue every 5 (five) minutes as needed for chest pain. 25 tablet 3   potassium chloride (KLOR-CON) 10 MEQ tablet TAKE 1 TABLET BY MOUTH ONCE DAILY 90 tablet 1   ranolazine (RANEXA) 1000 MG SR  tablet Take 1 tablet (1,000 mg total) by mouth 2 (two) times daily. 180 tablet 1   sitaGLIPtin (JANUVIA) 100 MG tablet Take 1 tablet (100 mg total) by mouth daily. 90 tablet 1   SYRINGE-NEEDLE, DISP, 3 ML (B-D SYRINGE/NEEDLE 3CC/25GX5/8) 25G X 5/8" 3 ML MISC For use with cyanocobalamin injections 50 each 0   tamsulosin (FLOMAX) 0.4 MG CAPS capsule Take 1 capsule (0.4 mg total) by mouth daily. 90 capsule 1   Current Facility-Administered Medications  Medication Dose Route Frequency Provider Last Rate Last Admin   sodium chloride flush (NS) 0.9 % injection 3 mL  3 mL Intravenous Q12H Furth, Cadence H, [name]        Allergies:   Ramipril    Social History:  The patient  reports that he has never smoked. His smokeless tobacco use includes chew. He reports that he does not drink alcohol and does not use drugs.   Family History:  The patient's family history includes Stroke in his mother.    ROS:  Please see the history of present illness.   Otherwise, review of systems are positive for LE edema-improved.   All other systems are reviewed and negative.    PHYSICAL EXAM: VS:  BP (!) 112/48   Pulse 70   Ht _0  (1.778 m)   Wt 169 lb (76.7 kg)   SpO2 97%   BMI 24.25 kg/m  , BMI Body mass index is 24.25 kg/m. GEN: Well nourished, well developed, in no acute distress HEENT: normal Neck: no JVD, carotid  bruits, or masses Cardiac: RRR; no murmurs, rubs, or gallops,no edema  Respiratory:  clear to auscultation bilaterally, normal work of breathing GI: soft, nontender, nondistended, + BS MS: no deformity or atrophy Skin: warm and dry, no rash Neuro:  Strength and sensation are intact Psych: euthymic mood, full affect    Recent Labs: 11/22/2021: ALT 18 02/17/2022: BNP 621.9 03/05/2022: BUN 14; Creatinine, Ser 1.25; Hemoglobin 10.2; Platelets 254; Potassium 4.5; Sodium 137   Lipid Panel    Component Value Date/Time   CHOL 114 05/16/2021 0923   TRIG 104 05/16/2021 0923   TRIG 136 02/28/2008 0000   HDL 35 (L) 05/16/2021 0923   CHOLHDL 3.3 05/16/2021 0923   LDLCALC 59 05/16/2021 0923     Other studies Reviewed: Additional studies/ records that were reviewed today with results demonstrating: I personally reviewed the cath films.  Echo showed EF of 50 to 55%.   ASSESSMENT AND PLAN:  CAD: Calcified chronic total occlusion of the right coronary artery.  Unsuccessful attempt at crossing this lesion with hydrophilic, tapered wire.  We discussed the fact that stiffer wires would be needed most likely to try to cross this lesion.  With CTO PCI, there would be higher risk of vessel perforation or dissection.  Another factor making this potential PCI more difficult is the lack of brisk, visualized left to right collaterals.  His left internal mammary has an unusual takeoff and is difficult to engage.  There are collaterals from the distal circumflex system filled by SVG to the posterolateral branch of the RCA.  Sx have improved since increased Ranexa.  Given the risks of CTO PCI as noted above, we will continue medical therapy.  I showed the angiogram pictures to the patient and his son. LE edema: Much improved on current Lasix dose. He will let us know if symptoms recur.  We also talked about activity modification.  Even his son states that the cleaning of the truck  is hard for him at 81 years old.   It may be reasonable for the patient at 81 years old to give up to his very strenuous activity.  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   Current medicines are reviewed at length with the patient today.  The patient concerns regarding his medicines were addressed.  The following changes have been made:  No change  Labs/ tests ordered today include:  No orders of the defined types were placed in this encounter.   Recommend 150 minutes/week of aerobic exercise Low fat, low carb, high fiber diet recommended  Disposition:   FU with Dr. Rockey Situ.     Signed, Larae Grooms, MD  03/11/2022 1:31 PM    [facility] [address], [place], [place]  [address] Phone: [phone]; Fax: [phone]

## 2022-03-11 NOTE — Patient Instructions (Signed)
Medication Instructions:  Your physician recommends that you continue on your current medications as directed. Please refer to the Current Medication list given to you today.  *If you need a refill on your cardiac medications before your next appointment, please call your pharmacy*  Follow-Up: At [facility], you and your health needs are our priority.  As part of our continuing mission to provide you with exceptional heart care, we have created designated Provider Care Teams.  These Care Teams include your primary Cardiologist (physician) and Advanced Practice Providers (APPs -  Physician Assistants and Nurse Practitioners) who all work together to provide you with the care you need, when you need it.  We recommend signing up for the patient portal called "MyChart".  Sign up information is provided on this After Visit Summary.  MyChart is used to connect with patients for Virtual Visits (Telemedicine).  Patients are able to view lab/test results, encounter notes, upcoming appointments, etc.  Non-urgent messages can be sent to your provider as well.   To learn more about what you can do with MyChart, go to [email or web address].    Your next appointment:   As needed  The format for your next appointment:   In Person  Provider:   Dr. Eldridge Dace   Other Instructions Follow up with Dr. Mariah Milling

## 2022-04-02 ENCOUNTER — Encounter: Payer: Self-pay | Admitting: Medical

## 2022-04-02 ENCOUNTER — Ambulatory Visit: Payer: Medicare PPO | Attending: Medical | Admitting: Medical

## 2022-04-02 NOTE — Progress Notes (Deleted)
Cardiology Office Note:    Date:  04/02/2022   ID:  [name] Gross Sr., DOB Dec 02, 1940, MRN [id number]  PCP:  [name], [name]  [facility] Cardiologist:  [name], MD  [place] Electrophysiologist:  None   Referring MD: [name], [name]   Chief Complaint: ***  History of Present Illness:    [name] Lower Pledger Sr. is a 81 y.o. male with a hx of  CAD s/p CABG (2004), HFimppEF, hypertension, hyperlipidemia, CKD 3, DM 2 who presents for follow-up cardiac cath.     H/o CAD s/p CABG x4 in 2004 with subsequent catheterization in 2007 revealing 3 and 4 patent grafts (occluded vein graft to the diagonal). Recent medication changes include addition of Cialis and discontinuation of Imdur (given interaction between these two mediations). Coreg has also been discontinued with patient previously on Coreg 6.25mg  BID but no longer taking for unclear reasons at this time.    Myoview 07/2019 was abnormal, high risk test with possible ischemia, LVEF 43%.  Echo 07/2019 showed LVEF 50-55%, G2DD, WMA, mildly dilated LA, moderately elevated pulmonary artery pressure.      Seen 05/16/21 and reported chest pain. Imdur was increased to 90 mg daily.    Seen 09/06/21 and reported chest pain and was still having exertional chest pain. A myoview lexiscan was ordered, this showed ischemia in the inferior wall with perfusion defect, moderate reversible in the inferior wall and apical region, EF 43%, moderate risk scan. Patient was brought in to discuss cardiac catheterization. HE was seen 09/19/21 and was set up for outpatient cardiac catheterization.    Cath showed multivessel coronary artery disease including severe diffuse LAD and left circumflex disease.  There is also a short, chronic total occlusion of the mid RCA with bridging with left-to-right collaterals.  Widely patent LIMA to LAD and sequential SVG to OM to OM 2.  Chronically occluded SVG to D1.  Normal LVEF with normal  filling pressures.  Unsuccessful attempted PCI to mid RCA due to inability to cross the lesion using run-through and Fielder XT wires.  Recommendation for escalation of antianginal therapy, Imdur was increased to 120 mg daily.  If the patient has refractory lifestyle limiting symptoms may need referral to CTO team. Appointment was a no-show.    He was last seen 02/17/22 and reported persistent shortness of chest pain and was referred to CTO specialist.   He saw Dr. Irish Lack 03/11/22 reporting minimal symptoms. It was felt it was a high risk procedure. Given minimal symptoms, no plan to pursue CTO PIC at this time.   Today,    Past Medical History:  Diagnosis Date   Arthritis    Back pain    hx   CAD (coronary artery disease)    a. 2004 s/p CABG x 4 (LIMA->LAD, VG->Diag, VG->OM, VG->RCA); b. 2007 Cath: VG->Diag occluded, otw 3/4 patent grafts.   CKD (chronic kidney disease), stage II-III    Colon polyps    DM2 (diabetes mellitus, type 2) ([place])    stable   GERD (gastroesophageal reflux disease)    Glaucoma    Gout    HLD (hyperlipidemia)    HTN (hypertension)    Neuropathy     Past Surgical History:  Procedure Laterality Date   BACK SURGERY     Cyst removed   CORONARY ARTERY BYPASS GRAFT  2004   CORONARY STENT INTERVENTION N/A 09/24/2021   Procedure: CORONARY STENT INTERVENTION;  Surgeon: [name], MD;  Location: [facility];  Service: Cardiovascular;  Laterality: N/A;   KNEE SURGERY     LEFT HEART CATH AND CORONARY ANGIOGRAPHY Left 09/24/2021   Procedure: LEFT HEART CATH AND CORONARY ANGIOGRAPHY;  Surgeon: Yvonne Kendall, MD;  Location: [facility];  Service: Cardiovascular;  Laterality: Left;    Current Medications: No outpatient medications have been marked as taking for the 04/02/22 encounter (Appointment) with Fransico Michael, Puanani Gene H, [name].   Current Facility-Administered Medications for the 04/02/22 encounter (Appointment) with Fransico Michael, Dianne Bady H, [name]   Medication   sodium chloride flush (NS) 0.9 % injection 3 mL     Allergies:   Ramipril   Social History   Socioeconomic History   Marital status: Widowed    Spouse name: Not on file   Number of children: 3   Years of education: Not on file   Highest education level: 8th grade  Occupational History   Not on file  Tobacco Use   Smoking status: Never   Smokeless tobacco: Current    Types: Chew   Tobacco comments:    chew tobacco - 1 pack last 3 weeks  Vaping Use   Vaping Use: Never used  Substance and Sexual Activity   Alcohol use: No    Alcohol/week: 0.0 standard drinks of alcohol   Drug use: No   Sexual activity: Not Currently  Other Topics Concern   Not on file  Social History Narrative   Not on file   Social Determinants of Health   Financial Resource Strain: Low Risk  (03/18/2018)   Overall Financial Resource Strain (CARDIA)    Difficulty of Paying Living Expenses: Not hard at all  Food Insecurity: No Food Insecurity (03/18/2018)   Hunger Vital Sign    Worried About Running Out of Food in the Last Year: Never true    Ran Out of Food in the Last Year: Never true  Transportation Needs: No Transportation Needs (03/18/2018)   PRAPARE - Administrator, Civil Service (Medical): No    Lack of Transportation (Non-Medical): No  Physical Activity: Not on file  Stress: No Stress Concern Present (03/18/2018)   [employer] of Occupational Health - Occupational Stress Questionnaire    Feeling of Stress : Not at all  Social Connections: Unknown (06/23/2017)   Social Connection and Isolation Panel [NHANES]    Frequency of Communication with Friends and Family: Not on file    Frequency of Social Gatherings with Friends and Family: Not on file    Attends Religious Services: Not on file    Active Member of Clubs or Organizations: Not on file    Attends Banker Meetings: Not on file    Marital Status: Widowed     Family History: The patient's  family history includes Stroke in his mother.  ROS:   Please see the history of present illness.     All other systems reviewed and are negative.  EKGs/Labs/Other Studies Reviewed:    The following studies were reviewed today: ***  EKG:  EKG is *** ordered today.  The ekg ordered today demonstrates ***  Recent Labs: 11/22/2021: ALT 18 02/17/2022: BNP 621.9 03/05/2022: BUN 14; Creatinine, Ser 1.25; Hemoglobin 10.2; Platelets 254; Potassium 4.5; Sodium 137  Recent Lipid Panel    Component Value Date/Time   CHOL 114 05/16/2021 0923   TRIG 104 05/16/2021 0923   TRIG 136 02/28/2008 0000   HDL 35 (L) 05/16/2021 0923   CHOLHDL 3.3 05/16/2021 0923   LDLCALC 59 05/16/2021 7209  Risk Assessment/Calculations:   {Does this patient have ATRIAL FIBRILLATION?:[phone]}   Physical Exam:    VS:  There were no vitals taken for this visit.    Wt Readings from Last 3 Encounters:  03/11/22 169 lb (76.7 kg)  02/25/22 176 lb 8 oz (80.1 kg)  02/17/22 182 lb (82.6 kg)     GEN: *** Well nourished, well developed in no acute distress HEENT: Normal NECK: No JVD; No carotid bruits LYMPHATICS: No lymphadenopathy CARDIAC: ***RRR, no murmurs, rubs, gallops RESPIRATORY:  Clear to auscultation without rales, wheezing or rhonchi  ABDOMEN: Soft, non-tender, non-distended MUSCULOSKELETAL:  No edema; No deformity  SKIN: Warm and dry NEUROLOGIC:  Alert and oriented x 3 PSYCHIATRIC:  Normal affect   ASSESSMENT:    No diagnosis found. PLAN:    In order of problems listed above:  Chest pain CAD s/p CABG in 2004  LLE HFimpEF  HTN  CKD stage 3  DM2  Disposition: Follow up {follow up:15908} with ***   Shared Decision Making/Informed Consent   {Are you ordering a CV Procedure (e.g. stress test, cath, DCCV, TEE, etc)?   Press F2        :[id number]}    Signed, Alyssa Mancera Ardelle Lesches  04/02/2022 7:46 AM    [facility]

## 2022-04-08 ENCOUNTER — Other Ambulatory Visit: Payer: Self-pay | Admitting: Physician Assistant

## 2022-04-08 DIAGNOSIS — M2550 Pain in unspecified joint: Secondary | ICD-10-CM

## 2022-04-14 ENCOUNTER — Other Ambulatory Visit: Payer: Self-pay | Admitting: Medical

## 2022-04-14 ENCOUNTER — Other Ambulatory Visit: Payer: Self-pay | Admitting: Cardiovascular Disease

## 2022-04-14 NOTE — Telephone Encounter (Signed)
Please reschedule F/U appt-patient did not show for last scheduled office visit.  Thank you!

## 2022-04-17 NOTE — Telephone Encounter (Signed)
Pt seen 03/20/22 by Dr. Irish Lack.   Refill sent to pharmacy.

## 2022-04-29 ENCOUNTER — Other Ambulatory Visit: Payer: Self-pay | Admitting: Cardiovascular Disease

## 2022-04-29 ENCOUNTER — Other Ambulatory Visit: Payer: Self-pay | Admitting: Physician Assistant

## 2022-04-29 DIAGNOSIS — M2550 Pain in unspecified joint: Secondary | ICD-10-CM

## 2022-04-29 DIAGNOSIS — M1A9XX0 Chronic gout, unspecified, without tophus (tophi): Secondary | ICD-10-CM

## 2022-04-29 DIAGNOSIS — R600 Localized edema: Secondary | ICD-10-CM

## 2022-04-29 NOTE — Telephone Encounter (Signed)
Refill sent to pharmacy with message patient needs appointment for future refills / 1st attempt

## 2022-05-15 ENCOUNTER — Telehealth: Payer: Self-pay | Admitting: Cardiovascular Disease

## 2022-05-15 NOTE — Telephone Encounter (Signed)
Pt c/o of Chest Pain: STAT if CP now or developed within 24 hours  1. Are you having CP right now? No   2. Are you experiencing any other symptoms (ex. SOB, nausea, vomiting, sweating)? Doesn't feel good. Getting over what he believes was food poisoning. Ate something last Thursday and hasn't felt good since.   3. How long have you been experiencing CP? Since last night   4. Is your CP continuous or coming and going? Coming and going   5. Have you taken Nitroglycerin? Yes.  ?

## 2022-05-15 NOTE — Telephone Encounter (Signed)
Patient stated that the past 2 nights when he lays down to sleep, he gets chest pressure, bilateral arm pain, and SOB. He takes 1 NTG that relieves the pressure and arm pain. He denies N-V or lightheadedness. He denies having a cough. He also stated he has had "food poisoning" since last Friday. Appointment made for 10/20 with C. Kathlen Mody, [name]. Recommended that if he needs 3 NTGs, to be taken to the ED. He verbalized understanding.

## 2022-05-16 ENCOUNTER — Encounter: Payer: Self-pay | Admitting: Medical

## 2022-05-16 ENCOUNTER — Ambulatory Visit: Payer: Medicare PPO | Attending: Medical | Admitting: Medical

## 2022-05-16 VITALS — BP 165/71 | HR 66 | Ht 70.0 in | Wt 167.2 lb

## 2022-05-16 DIAGNOSIS — G4733 Obstructive sleep apnea (adult) (pediatric): Secondary | ICD-10-CM

## 2022-05-16 DIAGNOSIS — I5022 Chronic systolic (congestive) heart failure: Secondary | ICD-10-CM

## 2022-05-16 DIAGNOSIS — E119 Type 2 diabetes mellitus without complications: Secondary | ICD-10-CM

## 2022-05-16 DIAGNOSIS — I10 Essential (primary) hypertension: Secondary | ICD-10-CM

## 2022-05-16 DIAGNOSIS — I25118 Atherosclerotic heart disease of native coronary artery with other forms of angina pectoris: Secondary | ICD-10-CM

## 2022-05-16 DIAGNOSIS — N183 Chronic kidney disease, stage 3 unspecified: Secondary | ICD-10-CM

## 2022-05-16 NOTE — Patient Instructions (Signed)
Medication Instructions:  Your physician recommends that you continue on your current medications as directed. Please refer to the Current Medication list given to you today.   Labwork: None  Testing/Procedures: None  Follow-Up: Follow up with Cadence Furth, [name] in 2 months.   Any Other Special Instructions Will Be Listed Below (If Applicable).     If you need a refill on your cardiac medications before your next appointment, please call your pharmacy.

## 2022-05-16 NOTE — Progress Notes (Unsigned)
Cardiology Office Note:    Date:  05/17/2022   ID:  [name] Gross Sr., DOB [birth year], MRN [id number]  PCP:  [name] Kirschner, [name]  [facility] Cardiologist:  [name], MD  [place] Electrophysiologist:  None   Referring MD: [name] Kirschner, [name]   Chief Complaint: chest pressure  History of Present Illness:    [name] Lower Martine Sr. is a 81 y.o. male with a hx of CAD s/p CABG (2004), HFimppEF, hypertension, hyperlipidemia, CKD 3, DM 2 who presents for follow-up cardiac cath.     H/o CAD s/p CABG x4 in 2004 with subsequent catheterization in 2007 revealing 3 and 4 patent grafts (occluded vein graft to the diagonal). Recent medication changes include addition of Cialis and discontinuation of Imdur (given interaction between these two mediations). Coreg has also been discontinued with patient previously on Coreg 6.34m BID but no longer taking for unclear reasons at this time.    Myoview 07/2019 was abnormal, high risk test with possible ischemia, LVEF 43%.  Echo 07/2019 showed LVEF 50-55%, G2DD, WMA, mildly dilated LA, moderately elevated pulmonary artery pressure.      Seen 05/16/21 and reported chest pain. Imdur was increased to 90 mg daily.    Seen 09/06/21 and reported chest pain and was still having exertional chest pain. A myoview lexiscan was ordered, this showed ischemia in the inferior wall with perfusion defect, moderate reversible in the inferior wall and apical region, EF 43%, moderate risk scan. Patient was brought in to discuss cardiac catheterization. HE was seen 09/19/21 and was set up for outpatient cardiac catheterization.    Cath showed multivessel coronary artery disease including severe diffuse LAD and left circumflex disease.  There is also a short, chronic total occlusion of the mid RCA with bridging with left-to-right collaterals.  Widely patent LIMA to LAD and sequential SVG to OM to OM 2.  Chronically occluded SVG to D1.  Normal LVEF with  normal filling pressures.  Unsuccessful attempted PCI to mid RCA due to inability to cross the lesion using run-through and Fielder XT wires.  Recommendation for escalation of antianginal therapy, Imdur was increased to 120 mg daily.  If the patient has refractory lifestyle limiting symptoms may need referral to CTO team. Appointment was a no-show.    He was last seen 11/18/21 and reported rare chest pain. He was again referred to the CTO specialist.  He saw Dr. [name] and CTO PCI was felt be high risk, and activity modification was recommended.   Today, the patient reports 2 episdoe of chest pain. He is getting over food poisoning and was having nasuea, vomiting, diarrhea. For 2 nights he experienced chest pain episodes while laying flat. He got up and took SL NT with resolution of the chest pain. Last night he had no chest pain. GI issues have since resolved.   Past Medical History:  Diagnosis Date   Arthritis    Back pain    hx   CAD (coronary artery disease)    a. 2004 s/p CABG x 4 (LIMA->LAD, VG->Diag, VG->OM, VG->RCA); b. 2007 Cath: VG->Diag occluded, otw 3/4 patent grafts.   CKD (chronic kidney disease), stage II-III    Colon polyps    DM2 (diabetes mellitus, type 2) ([facility])    stable   GERD (gastroesophageal reflux disease)    Glaucoma    Gout    HLD (hyperlipidemia)    HTN (hypertension)    Neuropathy     Past Surgical History:  Procedure Laterality  Date   BACK SURGERY     Cyst removed   CORONARY ARTERY BYPASS GRAFT  2004   CORONARY STENT INTERVENTION N/A 09/24/2021   Procedure: CORONARY STENT INTERVENTION;  Surgeon: [name], MD;  Location: [facility];  Service: Cardiovascular;  Laterality: N/A;   KNEE SURGERY     LEFT HEART CATH AND CORONARY ANGIOGRAPHY Left 09/24/2021   Procedure: LEFT HEART CATH AND CORONARY ANGIOGRAPHY;  Surgeon: [name], MD;  Location: [facility];  Service: Cardiovascular;  Laterality: Left;    Current  Medications: Current Meds  Medication Sig   ACCU-CHEK AVIVA PLUS test strip USE AS DIRECTED TO CHECK FASTING BLOOD SUGAR EVERY MORNING   allopurinol (ZYLOPRIM) 300 MG tablet TAKE 1 TABLET BY MOUTH ONCE DAILY   aspirin 81 MG chewable tablet Chew 81 mg by mouth daily.   atorvastatin (LIPITOR) 20 MG tablet TAKE 1 TABLET BY MOUTH ONCE DAILY   BD SHARPS CONTAINER HOME MISC Dispose of syringes after B12 injections   Blood Glucose Monitoring Suppl (ACCU-CHEK AVIVA PLUS) w/Device KIT To check blood sugar once daily   carvedilol (COREG) 6.25 MG tablet Take 1 tablet (6.25 mg total) by mouth 2 (two) times daily. Needs appointment for future refills  / 1st attempt   dapagliflozin propanediol (FARXIGA) 10 MG TABS tablet Take 1 tablet (10 mg total) by mouth daily. Please schedule office visit before any future refills.   furosemide (LASIX) 20 MG tablet TAKE 1 TABLET BY MOUTH AS DIRECTED. TAKE1 TABLET TWICE DAILY 3 DAYS PER WEEK AND1 TABLET ONCE DAILY ON OTHER DAYS.   gabapentin (NEURONTIN) 400 MG capsule TAKE 1 CAPSULE BY MOUTH 3 TIMES A DAY   glipiZIDE (GLUCOTROL XL) 10 MG 24 hr tablet Take 1 tablet (10 mg total) by mouth daily with breakfast.   isosorbide mononitrate (IMDUR) 120 MG 24 hr tablet Take 1 tablet (120 mg total) by mouth daily.   lisinopril (ZESTRIL) 20 MG tablet Take 1 tablet (20 mg total) by mouth daily.   meloxicam (MOBIC) 7.5 MG tablet TAKE 1 TABLET BY MOUTH TWICE DAILY   metFORMIN (GLUCOPHAGE) 1000 MG tablet TAKE 1 TABLET BY MOUTH TWICE A DAY WITH A MEAL   nitroGLYCERIN (NITROSTAT) 0.4 MG SL tablet Place 1 tablet (0.4 mg total) under the tongue every 5 (five) minutes as needed for chest pain.   potassium chloride (KLOR-CON) 10 MEQ tablet TAKE 1 TABLET BY MOUTH ONCE DAILY   ranolazine (RANEXA) 1000 MG SR tablet Take 1 tablet (1,000 mg total) by mouth 2 (two) times daily.   sitaGLIPtin (JANUVIA) 100 MG tablet Take 1 tablet (100 mg total) by mouth daily.   SYRINGE-NEEDLE, DISP, 3 ML (B-D  SYRINGE/NEEDLE 3CC/25GX5/8) 25G X 5/8" 3 ML MISC For use with cyanocobalamin injections   tamsulosin (FLOMAX) 0.4 MG CAPS capsule Take 1 capsule (0.4 mg total) by mouth daily.   Current Facility-Administered Medications for the 05/16/22 encounter (Office Visit) with Kathlen Mody, Valentin Benney H, [name]  Medication   sodium chloride flush (NS) 0.9 % injection 3 mL     Allergies:   Ramipril   Social History   Socioeconomic History   Marital status: Widowed    Spouse name: Not on file   Number of children: 3   Years of education: Not on file   Highest education level: 8th grade  Occupational History   Not on file  Tobacco Use   Smoking status: Never   Smokeless tobacco: Current    Types: Chew   Tobacco  comments:    chew tobacco - 1 pack last 3 weeks  Vaping Use   Vaping Use: Never used  Substance and Sexual Activity   Alcohol use: No    Alcohol/week: 0.0 standard drinks of alcohol   Drug use: No   Sexual activity: Not Currently  Other Topics Concern   Not on file  Social History Narrative   Not on file   Social Determinants of Health   Financial Resource Strain: Low Risk  (03/18/2018)   Overall Financial Resource Strain (CARDIA)    Difficulty of Paying Living Expenses: Not hard at all  Food Insecurity: No Food Insecurity (03/18/2018)   Hunger Vital Sign    Worried About Running Out of Food in the Last Year: Never true    Ran Out of Food in the Last Year: Never true  Transportation Needs: No Transportation Needs (03/18/2018)   PRAPARE - Hydrologist (Medical): No    Lack of Transportation (Non-Medical): No  Physical Activity: Not on file  Stress: No Stress Concern Present (03/18/2018)   [place]    Feeling of Stress : Not at all  Social Connections: Unknown (06/23/2017)   Social Connection and Isolation Panel [NHANES]    Frequency of Communication with Friends and Family: Not on file     Frequency of Social Gatherings with Friends and Family: Not on file    Attends Religious Services: Not on file    Active Member of Clubs or Organizations: Not on file    Attends Archivist Meetings: Not on file    Marital Status: Widowed     Family History: The patient's family history includes Stroke in his mother.  ROS:   Please see the history of present illness.     All other systems reviewed and are negative.  EKGs/Labs/Other Studies Reviewed:    The following studies were reviewed today:    Cardiac cath 09/24/21   Conclusions: Multivessel coronary artery disease including severe diffuse LAD and LCx disease.  There is also a short, chronic total occlusion of the mid RCA with bridging and left-right collaterals. Widely patent LIMA-LAD and sequential SVG-OM1-OM2. Chronically occluded SVG-D1. Normal left ventricular contraction (LVEF 55-65%) with normal filling pressure (LVEDP 10 mmHg). Unsuccessful attempted PCI to mid RCA due to inability to cross the lesion using Runthrough and Fielder XT wires (including with balloon support).   Recommendations: Escalate antianginal therapy.  I will increase isosorbide mononitrate to 120 mg daily.  If the patient has refractory lifestyle-limiting angina despite maximal tolerated antianginal therapy, referral to our CTO team for possible repeat intervention to the mid RCA could be considered. Aggressive secondary prevention of coronary artery disease.   [name], MD [facility]   Antiplatelet/Anticoag Continue aspirin 81 mg daily.  Defer adding P2Y12 inhibitor in the setting of unsuccessful PCI today.  Discharge Date In the absence of any other complications or medical issues, we expect the patient to be ready for discharge from an interventional cardiology perspective on 09/24/2021.      Coronary Diagrams   Diagnostic Dominance: Right Intervention          EKG:  EKG is ordered today.  The ekg ordered today  demonstrates NSR 66bpm, PAC, PVC, RBBB, nonspecific ST/T wave changes  Recent Labs: 11/22/2021: ALT 18 02/17/2022: BNP 621.9 03/05/2022: BUN 14; Creatinine, Ser 1.25; Hemoglobin 10.2; Platelets 254; Potassium 4.5; Sodium 137  Recent Lipid Panel    Component  Value Date/Time   CHOL 114 05/16/2021 0923   TRIG 104 05/16/2021 0923   TRIG 136 02/28/2008 0000   HDL 35 (L) 05/16/2021 0923   CHOLHDL 3.3 05/16/2021 0923   LDLCALC 59 05/16/2021 0923   Physical Exam:    VS:  BP (!) 165/71 (BP Location: Left Arm, Patient Position: Sitting, Cuff Size: Normal)   Pulse 66   Ht '5\' 10"'  (1.778 m)   Wt 167 lb 3.2 oz (75.8 kg)   SpO2 99%   BMI 23.99 kg/m     Wt Readings from Last 3 Encounters:  05/16/22 167 lb 3.2 oz (75.8 kg)  03/11/22 169 lb (76.7 kg)  02/25/22 176 lb 8 oz (80.1 kg)     GEN:  Well nourished, well developed in no acute distress HEENT: Normal NECK: No JVD; No carotid bruits LYMPHATICS: No lymphadenopathy CARDIAC: RRR, no murmurs, rubs, gallops RESPIRATORY:  Clear to auscultation without rales, wheezing or rhonchi  ABDOMEN: Soft, non-tender, non-distended MUSCULOSKELETAL:  No edema; No deformity  SKIN: Warm and dry NEUROLOGIC:  Alert and oriented x 3 PSYCHIATRIC:  Normal affect   ASSESSMENT:    1. Coronary artery disease of native artery of native heart with stable angina pectoris ([place])   2. Chronic systolic heart failure ([place])   3. Essential hypertension   4. OSA (obstructive sleep apnea)   5. Type 2 diabetes mellitus without complication, without long-term current use of insulin (H[facility])   6. Stage 3 chronic kidney disease, unspecified whether stage 3a or 3b CKD ([place])    PLAN:    In order of problems listed above:  Chest pain CAD s/p remote CABG in 2004 Chest pain in the setting of food poisoning with nausea, vomiting and diarrhea. Last night he had no chest pain. Previously saw Dr. Beau Fanny who explained CTO procedure was high risk and felt medication management and  activity management would be the best. At this point we will watch and wait and re-evaluate symptoms in 2 months. Continue Aspirin, BB and statin therapy.   HFimpEF He is euvolemic on exam. Continue lasix 29m daily, Coreg, Jardiance and Lisinopril.   HTN BP is higher, but he said he recently took his medications. Continue current medications.  CKD stage 3 Most recent BMET shows stable numbers.   DM2 A1C 7.5. Followed by PCP.  HLD LDL 59 04/2021. Continue statin therapy.   Disposition: Follow up in 2 month(s) with MD/APP   Signed, Jaymir Struble HNinfa Meeker [name]  05/17/2022 8:55 PM    [facility]

## 2022-05-19 ENCOUNTER — Ambulatory Visit: Payer: Medicare PPO | Admitting: Cardiovascular Disease

## 2022-05-23 ENCOUNTER — Other Ambulatory Visit: Payer: Self-pay | Admitting: Physician Assistant

## 2022-05-23 DIAGNOSIS — E119 Type 2 diabetes mellitus without complications: Secondary | ICD-10-CM

## 2022-05-27 ENCOUNTER — Other Ambulatory Visit (INDEPENDENT_AMBULATORY_CARE_PROVIDER_SITE_OTHER): Payer: Medicare PPO

## 2022-05-27 ENCOUNTER — Ambulatory Visit: Payer: Medicare PPO | Admitting: Physician Assistant

## 2022-05-27 DIAGNOSIS — Z23 Encounter for immunization: Secondary | ICD-10-CM

## 2022-05-27 NOTE — Progress Notes (Deleted)
I,Sha'taria Alize Acy,acting as a Education administrator for [employer], [name].,have documented all relevant documentation on the behalf of [name] Kirschner, [name],as directed by  [name] Kirschner, [name] while in the presence of [name] Kirschner, [name].   Complete physical exam   Patient: [name] [place] Sr.   DOB: Sep 11, 1940   81 y.o. Male  MRN: [id number] Visit Date: 05/27/2022  Today's healthcare provider: [name], [name]   No chief complaint on file.  Subjective    [name] [place] Sr. is a 80 y.o. male who presents today for a complete physical exam.  He reports consuming a {diet types:17450} diet. {Exercise:19826} He generally feels {well/fairly well/poorly:18703}. He reports sleeping {well/fairly well/poorly:18703}. He {does/does not:200015} have additional problems to discuss today.  HPI  ***  Past Medical History:  Diagnosis Date   Arthritis    Back pain    hx   CAD (coronary artery disease)    a. 2004 s/p CABG x 4 (LIMA->LAD, VG->Diag, VG->OM, VG->RCA); b. 2007 Cath: VG->Diag occluded, otw 3/4 patent grafts.   CKD (chronic kidney disease), stage II-III    Colon polyps    DM2 (diabetes mellitus, type 2) ([place])    stable   GERD (gastroesophageal reflux disease)    Glaucoma    Gout    HLD (hyperlipidemia)    HTN (hypertension)    Neuropathy    Past Surgical History:  Procedure Laterality Date   BACK SURGERY     Cyst removed   CORONARY ARTERY BYPASS GRAFT  2004   CORONARY STENT INTERVENTION N/A 09/24/2021   Procedure: CORONARY STENT INTERVENTION;  Surgeon: [name], MD;  Location: [facility];  Service: Cardiovascular;  Laterality: N/A;   KNEE SURGERY     LEFT HEART CATH AND CORONARY ANGIOGRAPHY Left 09/24/2021   Procedure: LEFT HEART CATH AND CORONARY ANGIOGRAPHY;  Surgeon: [name], MD;  Location: [facility];  Service: Cardiovascular;  Laterality: Left;   Social History   Socioeconomic History   Marital status: Widowed     Spouse name: Not on file   Number of children: 3   Years of education: Not on file   Highest education level: 8th grade  Occupational History   Not on file  Tobacco Use   Smoking status: Never   Smokeless tobacco: Current    Types: Chew   Tobacco comments:    chew tobacco - 1 pack last 3 weeks  Vaping Use   Vaping Use: Never used  Substance and Sexual Activity   Alcohol use: No    Alcohol/week: 0.0 standard drinks of alcohol   Drug use: No   Sexual activity: Not Currently  Other Topics Concern   Not on file  Social History Narrative   Not on file   Social Determinants of Health   Financial Resource Strain: Low Risk  (03/18/2018)   Overall Financial Resource Strain (CARDIA)    Difficulty of Paying Living Expenses: Not hard at all  Food Insecurity: No Food Insecurity (03/18/2018)   Hunger Vital Sign    Worried About Running Out of Food in the Last Year: Never true    Ran Out of Food in the Last Year: Never true  Transportation Needs: No Transportation Needs (03/18/2018)   PRAPARE - Hydrologist (Medical): No    Lack of Transportation (Non-Medical): No  Physical Activity: Not on file  Stress: No Stress Concern Present (03/18/2018)   [place]  Feeling of Stress : Not at all  Social Connections: Unknown (06/23/2017)   Social Connection and Isolation Panel [NHANES]    Frequency of Communication with Friends and Family: Not on file    Frequency of Social Gatherings with Friends and Family: Not on file    Attends Religious Services: Not on file    Active Member of Clubs or Organizations: Not on file    Attends Archivist Meetings: Not on file    Marital Status: Widowed  Human resources officer Violence: Not on file   Family Status  Relation Name Status   Mother  Deceased   Father  Deceased   Family History  Problem Relation Age of Onset   Stroke Mother    Allergies   Allergen Reactions   Ramipril     Patient Care Team: [name] Kirschner, [name] as PCP - General (Physician Assistant) Minna Merritts, MD as PCP - Cardiology (Cardiology) Minna Merritts, MD as Consulting Physician (Cardiology) Robert Bellow, MD (General Surgery)   Medications: Outpatient Medications Prior to Visit  Medication Sig   ACCU-CHEK AVIVA PLUS test strip USE AS DIRECTED TO CHECK FASTING BLOOD SUGAR EVERY MORNING   allopurinol (ZYLOPRIM) 300 MG tablet TAKE 1 TABLET BY MOUTH ONCE DAILY   aspirin 81 MG chewable tablet Chew 81 mg by mouth daily.   atorvastatin (LIPITOR) 20 MG tablet TAKE 1 TABLET BY MOUTH ONCE DAILY   BD SHARPS CONTAINER HOME MISC Dispose of syringes after B12 injections   Blood Glucose Monitoring Suppl (ACCU-CHEK AVIVA PLUS) w/Device KIT To check blood sugar once daily   carvedilol (COREG) 6.25 MG tablet Take 1 tablet (6.25 mg total) by mouth 2 (two) times daily. Needs appointment for future refills  / 1st attempt   dapagliflozin propanediol (FARXIGA) 10 MG TABS tablet Take 1 tablet (10 mg total) by mouth daily. Please schedule office visit before any future refills.   furosemide (LASIX) 20 MG tablet TAKE 1 TABLET BY MOUTH AS DIRECTED. TAKE1 TABLET TWICE DAILY 3 DAYS PER WEEK AND1 TABLET ONCE DAILY ON OTHER DAYS.   gabapentin (NEURONTIN) 400 MG capsule TAKE 1 CAPSULE BY MOUTH 3 TIMES A DAY   glipiZIDE (GLUCOTROL XL) 10 MG 24 hr tablet Take 1 tablet (10 mg total) by mouth daily with breakfast.   isosorbide mononitrate (IMDUR) 120 MG 24 hr tablet Take 1 tablet (120 mg total) by mouth daily.   lisinopril (ZESTRIL) 20 MG tablet Take 1 tablet (20 mg total) by mouth daily.   meloxicam (MOBIC) 7.5 MG tablet TAKE 1 TABLET BY MOUTH TWICE DAILY   metFORMIN (GLUCOPHAGE) 1000 MG tablet TAKE 1 TABLET BY MOUTH TWICE A DAY WITH A MEAL   nitroGLYCERIN (NITROSTAT) 0.4 MG SL tablet Place 1 tablet (0.4 mg total) under the tongue every 5 (five) minutes as needed for chest pain.    potassium chloride (KLOR-CON) 10 MEQ tablet TAKE 1 TABLET BY MOUTH ONCE DAILY   ranolazine (RANEXA) 1000 MG SR tablet Take 1 tablet (1,000 mg total) by mouth 2 (two) times daily.   sitaGLIPtin (JANUVIA) 100 MG tablet Take 1 tablet (100 mg total) by mouth daily.   SYRINGE-NEEDLE, DISP, 3 ML (B-D SYRINGE/NEEDLE 3CC/25GX5/8) 25G X 5/8" 3 ML MISC For use with cyanocobalamin injections   tamsulosin (FLOMAX) 0.4 MG CAPS capsule Take 1 capsule (0.4 mg total) by mouth daily.   Facility-Administered Medications Prior to Visit  Medication Dose Route Frequency Provider   sodium chloride flush (NS) 0.9 % injection 3 mL  3 mL Intravenous Q12H Furth, Cadence H, [name]    Review of Systems  {Labs  Heme  Chem  Endocrine  Serology  Results Review (optional):23779}  Objective    There were no vitals taken for this visit. {Show previous vital signs (optional):23777}   Physical Exam  ***  Last depression screening scores    11/22/2021    8:38 AM 08/26/2021    9:08 AM 07/25/2020    8:18 AM  PHQ 2/9 Scores  PHQ - 2 Score 0  0  PHQ- 9 Score 0    Exception Documentation Patient refusal Patient refusal    Last fall risk screening    11/22/2021    8:38 AM  [place] in the past year? 0  Number falls in past yr: 0  Injury with Fall? 0  Risk for fall due to : No Fall Risks   Last Audit-C alcohol use screening    11/22/2021    8:37 AM  Alcohol Use Disorder Test (AUDIT)  Patient refused Alcohol Screening Tool Yes   A score of 3 or more in women, and 4 or more in men indicates increased risk for alcohol abuse, EXCEPT if all of the points are from question 1   No results found for any visits on 05/27/22.  Assessment & Plan    Routine Health Maintenance and Physical Exam  Exercise Activities and Dietary recommendations  Goals      Increase water intake     Recommend increasing water intake to 6-8 glasses a day.       Quit Chewing Tobacco     Recommend to continue decreasing  amount of tobacco chewed then completely quitting.          Immunization History  Administered Date(s) Administered   Fluad Quad(high Dose 65+) 04/22/2019, 04/23/2020, 08/08/2021   Influenza, High Dose Seasonal PF 03/17/2017, 04/21/2018   Influenza,inj,Quad PF,6+ Mos 05/05/2016   Pneumococcal Conjugate-13 03/13/2016   Pneumococcal Polysaccharide-23 03/17/2017    Health Maintenance  Topic Date Due   COVID-19 Vaccine (1) Never done   OPHTHALMOLOGY EXAM  Never done   Zoster Vaccines- Shingrix (1 of 2) Never done   INFLUENZA VACCINE  02/25/2022   TETANUS/TDAP  11/23/2022 (Originally 11/19/1959)   FOOT EXAM  08/26/2022   HEMOGLOBIN A1C  09/05/2022   Diabetic kidney evaluation - Urine ACR  11/23/2022   Medicare Annual Wellness (AWV)  11/23/2022   Diabetic kidney evaluation - GFR measurement  03/06/2023   Pneumonia Vaccine 18+ Years old  Completed   HPV VACCINES  Aged Out    Discussed health benefits of physical activity, and encouraged him to engage in regular exercise appropriate for his age and condition.  ***  No follow-ups on file.     {provider attestation***:1}   [name] Kirschner, [name]  [facility] [phone] (phone) [phone] (fax)  [place]

## 2022-05-28 ENCOUNTER — Ambulatory Visit: Payer: Medicare PPO | Admitting: Physician Assistant

## 2022-06-16 ENCOUNTER — Ambulatory Visit
Admission: RE | Admit: 2022-06-16 | Discharge: 2022-06-16 | Disposition: A | Discharge status: Home / Self Care | Payer: Medicare PPO | Source: Ambulatory Visit | Attending: Physician Assistant | Admitting: Physician Assistant

## 2022-06-16 ENCOUNTER — Inpatient Hospital Stay (HOSPITAL_COMMUNITY): Payer: Medicare PPO

## 2022-06-16 ENCOUNTER — Encounter: Payer: Self-pay | Admitting: Physician Assistant

## 2022-06-16 ENCOUNTER — Other Ambulatory Visit: Payer: Self-pay

## 2022-06-16 ENCOUNTER — Other Ambulatory Visit: Payer: Self-pay | Admitting: Physician Assistant

## 2022-06-16 ENCOUNTER — Ambulatory Visit: Payer: Self-pay

## 2022-06-16 ENCOUNTER — Encounter (HOSPITAL_COMMUNITY): Payer: Self-pay

## 2022-06-16 ENCOUNTER — Ambulatory Visit
Admission: RE | Admit: 2022-06-16 | Discharge: 2022-06-16 | Disposition: A | Discharge status: Home / Self Care | Payer: Medicare PPO | Attending: Physician Assistant | Admitting: Physician Assistant

## 2022-06-16 ENCOUNTER — Ambulatory Visit (INDEPENDENT_AMBULATORY_CARE_PROVIDER_SITE_OTHER): Payer: Medicare PPO | Admitting: Physician Assistant

## 2022-06-16 ENCOUNTER — Inpatient Hospital Stay (HOSPITAL_COMMUNITY)
Admission: EM | Admit: 2022-06-16 | Discharge: 2022-06-19 | DRG: 291 | Disposition: A | Discharge status: Home / Self Care | Payer: Medicare PPO | Attending: Internal Medicine | Admitting: Internal Medicine

## 2022-06-16 VITALS — BP 191/94 | HR 68 | Ht 70.0 in | Wt 169.0 lb

## 2022-06-16 DIAGNOSIS — I509 Heart failure, unspecified: Secondary | ICD-10-CM | POA: Diagnosis not present

## 2022-06-16 DIAGNOSIS — R0601 Orthopnea: Secondary | ICD-10-CM | POA: Insufficient documentation

## 2022-06-16 DIAGNOSIS — I152 Hypertension secondary to endocrine disorders: Secondary | ICD-10-CM | POA: Diagnosis present

## 2022-06-16 DIAGNOSIS — E1159 Type 2 diabetes mellitus with other circulatory complications: Secondary | ICD-10-CM | POA: Diagnosis not present

## 2022-06-16 DIAGNOSIS — I2510 Atherosclerotic heart disease of native coronary artery without angina pectoris: Secondary | ICD-10-CM | POA: Diagnosis present

## 2022-06-16 DIAGNOSIS — E1140 Type 2 diabetes mellitus with diabetic neuropathy, unspecified: Secondary | ICD-10-CM | POA: Diagnosis not present

## 2022-06-16 DIAGNOSIS — K219 Gastro-esophageal reflux disease without esophagitis: Secondary | ICD-10-CM | POA: Diagnosis present

## 2022-06-16 DIAGNOSIS — E785 Hyperlipidemia, unspecified: Secondary | ICD-10-CM | POA: Diagnosis present

## 2022-06-16 DIAGNOSIS — Z20822 Contact with and (suspected) exposure to covid-19: Secondary | ICD-10-CM | POA: Diagnosis present

## 2022-06-16 DIAGNOSIS — E118 Type 2 diabetes mellitus with unspecified complications: Secondary | ICD-10-CM

## 2022-06-16 DIAGNOSIS — E1169 Type 2 diabetes mellitus with other specified complication: Secondary | ICD-10-CM | POA: Diagnosis not present

## 2022-06-16 DIAGNOSIS — D649 Anemia, unspecified: Secondary | ICD-10-CM | POA: Diagnosis not present

## 2022-06-16 DIAGNOSIS — Z823 Family history of stroke: Secondary | ICD-10-CM

## 2022-06-16 DIAGNOSIS — R188 Other ascites: Secondary | ICD-10-CM | POA: Diagnosis not present

## 2022-06-16 DIAGNOSIS — H409 Unspecified glaucoma: Secondary | ICD-10-CM | POA: Diagnosis present

## 2022-06-16 DIAGNOSIS — M109 Gout, unspecified: Secondary | ICD-10-CM | POA: Diagnosis present

## 2022-06-16 DIAGNOSIS — I5022 Chronic systolic (congestive) heart failure: Secondary | ICD-10-CM | POA: Diagnosis not present

## 2022-06-16 DIAGNOSIS — I5023 Acute on chronic systolic (congestive) heart failure: Secondary | ICD-10-CM | POA: Diagnosis not present

## 2022-06-16 DIAGNOSIS — R600 Localized edema: Secondary | ICD-10-CM

## 2022-06-16 DIAGNOSIS — E1122 Type 2 diabetes mellitus with diabetic chronic kidney disease: Secondary | ICD-10-CM | POA: Diagnosis not present

## 2022-06-16 DIAGNOSIS — I10 Essential (primary) hypertension: Secondary | ICD-10-CM | POA: Diagnosis present

## 2022-06-16 DIAGNOSIS — I130 Hypertensive heart and chronic kidney disease with heart failure and stage 1 through stage 4 chronic kidney disease, or unspecified chronic kidney disease: Secondary | ICD-10-CM | POA: Diagnosis not present

## 2022-06-16 DIAGNOSIS — Z7982 Long term (current) use of aspirin: Secondary | ICD-10-CM | POA: Diagnosis not present

## 2022-06-16 DIAGNOSIS — I272 Pulmonary hypertension, unspecified: Secondary | ICD-10-CM | POA: Diagnosis not present

## 2022-06-16 DIAGNOSIS — Z951 Presence of aortocoronary bypass graft: Secondary | ICD-10-CM

## 2022-06-16 DIAGNOSIS — J9811 Atelectasis: Secondary | ICD-10-CM | POA: Diagnosis not present

## 2022-06-16 DIAGNOSIS — I4510 Unspecified right bundle-branch block: Secondary | ICD-10-CM | POA: Diagnosis present

## 2022-06-16 DIAGNOSIS — Z79899 Other long term (current) drug therapy: Secondary | ICD-10-CM | POA: Diagnosis not present

## 2022-06-16 DIAGNOSIS — R0989 Other specified symptoms and signs involving the circulatory and respiratory systems: Secondary | ICD-10-CM | POA: Diagnosis not present

## 2022-06-16 DIAGNOSIS — Z7984 Long term (current) use of oral hypoglycemic drugs: Secondary | ICD-10-CM

## 2022-06-16 DIAGNOSIS — N1831 Chronic kidney disease, stage 3a: Secondary | ICD-10-CM | POA: Diagnosis present

## 2022-06-16 DIAGNOSIS — E1165 Type 2 diabetes mellitus with hyperglycemia: Secondary | ICD-10-CM | POA: Diagnosis present

## 2022-06-16 DIAGNOSIS — R0602 Shortness of breath: Principal | ICD-10-CM

## 2022-06-16 DIAGNOSIS — J90 Pleural effusion, not elsewhere classified: Secondary | ICD-10-CM | POA: Diagnosis not present

## 2022-06-16 DIAGNOSIS — I110 Hypertensive heart disease with heart failure: Secondary | ICD-10-CM | POA: Diagnosis not present

## 2022-06-16 DIAGNOSIS — I5033 Acute on chronic diastolic (congestive) heart failure: Secondary | ICD-10-CM | POA: Diagnosis not present

## 2022-06-16 DIAGNOSIS — I5043 Acute on chronic combined systolic (congestive) and diastolic (congestive) heart failure: Secondary | ICD-10-CM | POA: Diagnosis present

## 2022-06-16 DIAGNOSIS — R079 Chest pain, unspecified: Secondary | ICD-10-CM | POA: Diagnosis not present

## 2022-06-16 LAB — COMPREHENSIVE METABOLIC PANEL
ALT: 52 U/L — ABNORMAL HIGH (ref 0–44)
AST: 27 U/L (ref 15–41)
Albumin: 3.5 g/dL (ref 3.5–5.0)
Alkaline Phosphatase: 58 U/L (ref 38–126)
Anion gap: 9 (ref 5–15)
BUN: 13 mg/dL (ref 8–23)
CO2: 24 mmol/L (ref 22–32)
Calcium: 8.7 mg/dL — ABNORMAL LOW (ref 8.9–10.3)
Chloride: 103 mmol/L (ref 98–111)
Creatinine, Ser: 1.36 mg/dL — ABNORMAL HIGH (ref 0.61–1.24)
GFR, Estimated: 53 mL/min — ABNORMAL LOW (ref >60)
Glucose, Bld: 278 mg/dL — ABNORMAL HIGH (ref 70–99)
Potassium: 4.4 mmol/L (ref 3.5–5.1)
Sodium: 136 mmol/L (ref 135–145)
Total Bilirubin: 0.6 mg/dL (ref 0.3–1.2)
Total Protein: 5.9 g/dL — ABNORMAL LOW (ref 6.5–8.1)

## 2022-06-16 LAB — CBC WITH DIFFERENTIAL/PLATELET
Abs Immature Granulocytes: 0.01 10*3/uL (ref 0.00–0.07)
Basophils Absolute: 0 10*3/uL (ref 0.0–0.1)
Basophils Relative: 1 %
Eosinophils Absolute: 0.1 10*3/uL (ref 0.0–0.5)
Eosinophils Relative: 2 %
HCT: 30.4 % — ABNORMAL LOW (ref 39.0–52.0)
Hemoglobin: 9.9 g/dL — ABNORMAL LOW (ref 13.0–17.0)
Immature Granulocytes: 0 %
Lymphocytes Relative: 29 %
Lymphs Abs: 1.8 10*3/uL (ref 0.7–4.0)
MCH: 30 pg (ref 26.0–34.0)
MCHC: 32.6 g/dL (ref 30.0–36.0)
MCV: 92.1 fL (ref 80.0–100.0)
Monocytes Absolute: 0.5 10*3/uL (ref 0.1–1.0)
Monocytes Relative: 8 %
Neutro Abs: 3.7 10*3/uL (ref 1.7–7.7)
Neutrophils Relative %: 60 %
Platelets: 209 10*3/uL (ref 150–400)
RBC: 3.3 MIL/uL — ABNORMAL LOW (ref 4.22–5.81)
RDW: 16.4 % — ABNORMAL HIGH (ref 11.5–15.5)
WBC: 6.1 10*3/uL (ref 4.0–10.5)
nRBC: 0 % (ref 0.0–0.2)

## 2022-06-16 LAB — RESP PANEL BY RT-PCR (FLU A&B, COVID) ARPGX2
Influenza A by PCR: NEGATIVE
Influenza B by PCR: NEGATIVE
SARS Coronavirus 2 by RT PCR: NEGATIVE

## 2022-06-16 LAB — CBG MONITORING, ED: Glucose-Capillary: 134 mg/dL — ABNORMAL HIGH (ref 70–99)

## 2022-06-16 LAB — TROPONIN I (HIGH SENSITIVITY)
Troponin I (High Sensitivity): 9 ng/L (ref <18)
Troponin I (High Sensitivity): 10 ng/L (ref <18)

## 2022-06-16 LAB — BRAIN NATRIURETIC PEPTIDE: B Natriuretic Peptide: 828.8 pg/mL — ABNORMAL HIGH (ref 0.0–100.0)

## 2022-06-16 MED ORDER — ASPIRIN 81 MG PO CHEW
81.0000 mg | CHEWABLE_TABLET | Freq: Every day | ORAL | Status: DC
  Administered 2022-06-17 – 2022-06-19 (×3): 81 mg via ORAL
  Filled 2022-06-16 (×3): qty 1

## 2022-06-16 MED ORDER — INSULIN ASPART 100 UNIT/ML IJ SOLN
0.0000 [IU] | Freq: Every day | INTRAMUSCULAR | Status: DC
  Administered 2022-06-17 – 2022-06-18 (×2): 2 [IU] via SUBCUTANEOUS

## 2022-06-16 MED ORDER — DAPAGLIFLOZIN PROPANEDIOL 10 MG PO TABS: 10.0000 mg | ORAL_TABLET | Freq: Every day | ORAL | Status: DC

## 2022-06-16 MED ORDER — SODIUM CHLORIDE 0.9% FLUSH
3.0000 mL | Freq: Two times a day (BID) | INTRAVENOUS | Status: DC
  Administered 2022-06-16 – 2022-06-19 (×6): 3 mL via INTRAVENOUS

## 2022-06-16 MED ORDER — ONDANSETRON HCL 4 MG/2ML IJ SOLN: 4.0000 mg | Freq: Four times a day (QID) | INTRAMUSCULAR | Status: DC | PRN

## 2022-06-16 MED ORDER — ENOXAPARIN SODIUM 40 MG/0.4ML IJ SOSY
40.0000 mg | PREFILLED_SYRINGE | INTRAMUSCULAR | Status: DC
  Administered 2022-06-16 – 2022-06-18 (×3): 40 mg via SUBCUTANEOUS
  Filled 2022-06-16 (×3): qty 0.4

## 2022-06-16 MED ORDER — GABAPENTIN 300 MG PO CAPS
300.0000 mg | ORAL_CAPSULE | Freq: Three times a day (TID) | ORAL | Status: DC
  Administered 2022-06-17 – 2022-06-19 (×7): 300 mg via ORAL
  Filled 2022-06-16 (×7): qty 1

## 2022-06-16 MED ORDER — LINAGLIPTIN 5 MG PO TABS: 5.0000 mg | ORAL_TABLET | Freq: Every day | ORAL | Status: DC

## 2022-06-16 MED ORDER — ISOSORBIDE MONONITRATE ER 30 MG PO TB24: 120.0000 mg | ORAL_TABLET | Freq: Every day | ORAL | Status: DC

## 2022-06-16 MED ORDER — TAMSULOSIN HCL 0.4 MG PO CAPS
0.4000 mg | ORAL_CAPSULE | Freq: Every day | ORAL | Status: DC
  Administered 2022-06-17 – 2022-06-19 (×3): 0.4 mg via ORAL
  Filled 2022-06-16 (×3): qty 1

## 2022-06-16 MED ORDER — LISINOPRIL 20 MG PO TABS
20.0000 mg | ORAL_TABLET | Freq: Every day | ORAL | Status: DC
  Administered 2022-06-17: 20 mg via ORAL
  Filled 2022-06-16: qty 1

## 2022-06-16 MED ORDER — FERROUS SULFATE 325 (65 FE) MG PO TABS
325.0000 mg | ORAL_TABLET | Freq: Every day | ORAL | Status: DC
  Administered 2022-06-17 – 2022-06-19 (×3): 325 mg via ORAL
  Filled 2022-06-16 (×3): qty 1

## 2022-06-16 MED ORDER — FUROSEMIDE 10 MG/ML IJ SOLN
40.0000 mg | Freq: Once | INTRAMUSCULAR | Status: AC
  Administered 2022-06-16: 40 mg via INTRAVENOUS
  Filled 2022-06-16: qty 4

## 2022-06-16 MED ORDER — RANOLAZINE ER 500 MG PO TB12
1000.0000 mg | ORAL_TABLET | Freq: Two times a day (BID) | ORAL | Status: DC
  Administered 2022-06-16 – 2022-06-19 (×6): 1000 mg via ORAL
  Filled 2022-06-16 (×6): qty 2

## 2022-06-16 MED ORDER — INSULIN ASPART 100 UNIT/ML IJ SOLN
0.0000 [IU] | Freq: Three times a day (TID) | INTRAMUSCULAR | Status: DC
  Administered 2022-06-17 (×2): 3 [IU] via SUBCUTANEOUS
  Administered 2022-06-17 – 2022-06-18 (×3): 5 [IU] via SUBCUTANEOUS
  Administered 2022-06-19: 3 [IU] via SUBCUTANEOUS

## 2022-06-16 MED ORDER — SODIUM CHLORIDE 0.9% FLUSH: 3.0000 mL | INTRAVENOUS | Status: DC | PRN

## 2022-06-16 MED ORDER — GABAPENTIN 300 MG PO CAPS: 400.0000 mg | ORAL_CAPSULE | Freq: Three times a day (TID) | ORAL | Status: DC

## 2022-06-16 MED ORDER — FUROSEMIDE 10 MG/ML IJ SOLN
40.0000 mg | Freq: Two times a day (BID) | INTRAMUSCULAR | Status: DC
  Administered 2022-06-17 – 2022-06-19 (×5): 40 mg via INTRAVENOUS
  Filled 2022-06-16 (×5): qty 4

## 2022-06-16 MED ORDER — ATORVASTATIN CALCIUM 10 MG PO TABS
20.0000 mg | ORAL_TABLET | Freq: Every day | ORAL | Status: DC
  Administered 2022-06-17 – 2022-06-19 (×3): 20 mg via ORAL
  Filled 2022-06-16 (×3): qty 2

## 2022-06-16 MED ORDER — ACETAMINOPHEN 325 MG PO TABS: 650.0000 mg | ORAL_TABLET | ORAL | Status: DC | PRN

## 2022-06-16 MED ORDER — SODIUM CHLORIDE 0.9 % IV SOLN: 250.0000 mL | INTRAVENOUS | Status: DC | PRN

## 2022-06-16 MED ORDER — ALLOPURINOL 300 MG PO TABS
300.0000 mg | ORAL_TABLET | Freq: Every day | ORAL | Status: DC
  Administered 2022-06-17 – 2022-06-19 (×3): 300 mg via ORAL
  Filled 2022-06-16 (×2): qty 1
  Filled 2022-06-16: qty 3

## 2022-06-16 MED ORDER — CARVEDILOL 6.25 MG PO TABS
6.2500 mg | ORAL_TABLET | Freq: Two times a day (BID) | ORAL | Status: DC
  Administered 2022-06-16 – 2022-06-19 (×6): 6.25 mg via ORAL
  Filled 2022-06-16: qty 2
  Filled 2022-06-16: qty 1
  Filled 2022-06-16: qty 2
  Filled 2022-06-16 (×3): qty 1

## 2022-06-16 MED ORDER — HYDRALAZINE HCL 10 MG PO TABS
10.0000 mg | ORAL_TABLET | Freq: Three times a day (TID) | ORAL | Status: DC
  Administered 2022-06-16 – 2022-06-19 (×8): 10 mg via ORAL
  Filled 2022-06-16 (×8): qty 1

## 2022-06-16 NOTE — ED Notes (Signed)
Pharmacy at bedside

## 2022-06-16 NOTE — ED Notes (Signed)
Pharmacy asked about unverified meds for pt

## 2022-06-16 NOTE — Assessment & Plan Note (Signed)
Managed with farxiga 10 mg glipizide 10 mg, metformin 1000 mg BID, januvia 100 mg.  Last A1c 7.5%, ordered today. Foot exam UTD Uacr utd Optho utd Given hypoglycemia, consider dec. Glipizide dose pending A1c. F/u 4 mo

## 2022-06-16 NOTE — Telephone Encounter (Signed)
LVM advising patient to go to ED

## 2022-06-16 NOTE — ED Triage Notes (Signed)
Was seen at PCP and had extremely high bp cbg and fluid in left lung so was sent to ER for further work up.  Also complains of increased abd swelling and bilateral lower extremity.

## 2022-06-16 NOTE — Assessment & Plan Note (Addendum)
Current exacerbation Advised pt to increase lasix to 40 mg in AM and 20 mg in PM Ordered chest xray d/t crackles base of L lung Ordered bmp,bnp,cbc O2 at rest 98% strict ED recommendations  F/u 1 week

## 2022-06-16 NOTE — Assessment & Plan Note (Signed)
Acutely elevated  Pt has not taken meds today Given CHF exacerbation increasing lasix dose Will see back in 1 week and determine further bp management form there if needed

## 2022-06-16 NOTE — ED Provider Notes (Signed)
[facility] EMERGENCY DEPARTMENT Provider Note   CSN: [id number] Arrival date & time: 06/16/22  1531     History  Chief Complaint  Patient presents with   Shortness of [place] Sr. is a 81 y.o. male.   Shortness of Breath  The patient is a 81 year old male with past medical history of CAD s/p four-vessel CABG (2004), CHF (EF 43%), HTN, HLD, T2DM presenting from his PCPs office for shortness of breath.  The patient states that he has had worsening shortness of breath over the past few days.  He states that it acutely worsened yesterday.  He has been experiencing orthopnea, dyspnea on exertion, bilateral lower extremity swelling, and abdominal distention.  He had a single episode of left-sided chest pain while walking into his PCPs office earlier today which self resolved when he sat down.  He describes the pain as left-sided, nonradiating, without associated diaphoresis, nausea, vomiting.  He denies recent infectious symptoms, fevers, coughs, diarrhea, or urinary symptoms and has been compliant with all of his medication.     Home Medications Prior to Admission medications   Medication Sig Start Date End Date Taking? Authorizing Provider  allopurinol (ZYLOPRIM) 300 MG tablet TAKE 1 TABLET BY MOUTH ONCE DAILY Patient taking differently: Take 300 mg by mouth daily. 04/29/22  Yes [name], [name]  aspirin 81 MG chewable tablet Chew 81 mg by mouth daily.   Yes [provider]  atorvastatin (LIPITOR) 20 MG tablet TAKE 1 TABLET BY MOUTH ONCE DAILY Patient taking differently: Take 20 mg by mouth daily. 04/17/22  Yes Jettie Booze, MD  carvedilol (COREG) 6.25 MG tablet Take 1 tablet (6.25 mg total) by mouth 2 (two) times daily. Needs appointment for future refills  / 1st attempt 04/29/22  Yes Gollan, Kathlene November, MD  gabapentin (NEURONTIN) 400 MG capsule TAKE 1 CAPSULE BY MOUTH 3 TIMES A DAY Patient taking differently: Take 400 mg by  mouth 3 (three) times daily. 09/19/21  Yes [name], [name]  glipiZIDE (GLUCOTROL XL) 10 MG 24 hr tablet Take 1 tablet (10 mg total) by mouth daily with breakfast. 08/26/21  Yes Drubel, Ria Comment, [name]  lisinopril (ZESTRIL) 20 MG tablet Take 1 tablet (20 mg total) by mouth daily. 02/25/22  Yes Drubel, Ria Comment, [name]  meloxicam (MOBIC) 7.5 MG tablet TAKE 1 TABLET BY MOUTH TWICE DAILY Patient taking differently: Take 7.5 mg by mouth daily. 04/29/22  Yes [name], [name]  tamsulosin (FLOMAX) 0.4 MG CAPS capsule Take 1 capsule (0.4 mg total) by mouth daily. 02/25/22  Yes [name], [name]  ACCU-CHEK AVIVA PLUS test strip USE AS DIRECTED TO CHECK FASTING BLOOD SUGAR EVERY MORNING 10/15/21   [name], [name]  BD SHARPS CONTAINER HOME [place] of syringes after B12 injections 10/19/17   Mar Daring, [name]  Blood Glucose Monitoring Suppl (ACCU-CHEK AVIVA PLUS) w/Device KIT To check blood sugar once daily 06/11/17   Fenton Malling M, [name]  dapagliflozin propanediol (FARXIGA) 10 MG TABS tablet Take 1 tablet (10 mg total) by mouth daily. Please schedule office visit before any future refills. Patient not taking: Reported on 06/16/2022 08/26/21   [name], [name]  furosemide (LASIX) 20 MG tablet TAKE 1 TABLET BY MOUTH AS DIRECTED. TAKE1 TABLET TWICE DAILY 3 DAYS PER WEEK AND1 TABLET ONCE DAILY ON OTHER DAYS. Patient taking differently: Take two tablets (40 mg) by mouth in the morning and one tablet (20 mg) by mouth in the evening 04/14/22  Furth, Cadence H, [name]  isosorbide mononitrate (IMDUR) 120 MG 24 hr tablet Take 1 tablet (120 mg total) by mouth daily. Patient not taking: Reported on 06/16/2022 09/24/21   End, Harrell Gave, MD  JANUVIA 100 MG tablet TAKE 1 TABLET BY MOUTH ONCE DAILY Patient not taking: Reported on 06/16/2022 06/16/22   [name], [name]  metFORMIN (GLUCOPHAGE) 1000 MG tablet TAKE 1 TABLET BY MOUTH TWICE A DAY WITH A MEAL Patient taking differently: Take 1,000  mg by mouth 2 (two) times daily with a meal. 05/23/22   Drubel, Ria Comment, [name]  nitroGLYCERIN (NITROSTAT) 0.4 MG SL tablet Place 1 tablet (0.4 mg total) under the tongue every 5 (five) minutes as needed for chest pain. 02/17/22   Furth, Cadence H, [name]  potassium chloride (KLOR-CON) 10 MEQ tablet TAKE 1 TABLET BY MOUTH ONCE DAILY Patient taking differently: Take 10 mEq by mouth daily. 04/29/22   [name], [name]  ranolazine (RANEXA) 1000 MG SR tablet Take 1 tablet (1,000 mg total) by mouth 2 (two) times daily. Patient not taking: Reported on 06/16/2022 02/17/22   Furth, Cadence H, [name]  SYRINGE-NEEDLE, DISP, 3 ML (B-D SYRINGE/NEEDLE 3CC/25GX5/8) 25G X 5/8" 3 ML MISC For use with cyanocobalamin injections 10/19/17   Mar Daring, [name]      Allergies    Ramipril    Review of Systems   Review of Systems  Respiratory:  Positive for shortness of breath.    See HPI Physical Exam Updated Vital Signs BP (!) 152/66   Pulse 70   Temp 97.6 F (36.4 C) (Oral)   Resp 20   Ht _0  (1.778 m)   Wt 76.7 kg   SpO2 96%   BMI 24.25 kg/m  Physical Exam Vitals and nursing note reviewed.  Constitutional:      General: He is not in acute distress.    Appearance: He is well-developed.  HENT:     Head: Normocephalic and atraumatic.  Eyes:     Conjunctiva/sclera: Conjunctivae normal.  Cardiovascular:     Rate and Rhythm: Normal rate and regular rhythm.     Heart sounds: No murmur heard. Pulmonary:     Effort: Pulmonary effort is normal. No respiratory distress.     Comments: Breath sounds diminished in the left lower lobe Abdominal:     Tenderness: There is no abdominal tenderness.     Comments: Abdomen distended but nontender  Musculoskeletal:        General: No swelling.     Cervical back: Neck supple.     Comments: 2+ pitting bilateral lower extremity edema  Skin:    General: Skin is warm and dry.     Capillary Refill: Capillary refill takes less than 2 seconds.  Neurological:      Mental Status: He is alert.  Psychiatric:        Mood and Affect: Mood normal.     ED Results / Procedures / Treatments   Labs (all labs ordered are listed, but only abnormal results are displayed) Labs Reviewed  COMPREHENSIVE METABOLIC PANEL - Abnormal; Notable for the following components:      Result Value   Glucose, Bld 278 (*)    Creatinine, Ser 1.36 (*)    Calcium 8.7 (*)    Total Protein 5.9 (*)    ALT 52 (*)    GFR, Estimated 53 (*)    All other components within normal limits  CBC WITH DIFFERENTIAL/PLATELET - Abnormal; Notable for the following components:   RBC 3.30 (*)  Hemoglobin 9.9 (*)    HCT 30.4 (*)    RDW 16.4 (*)    All other components within normal limits  BRAIN NATRIURETIC PEPTIDE - Abnormal; Notable for the following components:   B Natriuretic Peptide 828.8 (*)    All other components within normal limits  CBG MONITORING, ED - Abnormal; Notable for the following components:   Glucose-Capillary 134 (*)    All other components within normal limits  RESP PANEL BY RT-PCR (FLU A&B, COVID) ARPGX2  BASIC METABOLIC PANEL  TROPONIN I (HIGH SENSITIVITY)  TROPONIN I (HIGH SENSITIVITY)    EKG   Radiology [place] ASCITES (ABDOMEN LIMITED)  Result Date: 06/16/2022 CLINICAL DATA:  CHF. EXAM: LIMITED ABDOMEN ULTRASOUND FOR ASCITES TECHNIQUE: Limited ultrasound survey for ascites was performed in all four abdominal quadrants. COMPARISON:  None Available. FINDINGS: No ascites visualized in the 4 abdominal quadrants. There is a right pleural effusion. IMPRESSION: No ascites. Right pleural effusion. Electronically Signed   By: Ronney Asters M.D.   On: 06/16/2022 22:19   DG Chest 2 View  Result Date: 06/16/2022 CLINICAL DATA:  History of CHF. Abnormal breath sounds and shortness of breath. EXAM: CHEST - 2 VIEW COMPARISON:  None recent FINDINGS: Previous median sternotomy and CABG procedure. Small bilateral pleural effusions and mild interstitial edema identified.  Subsegmental atelectasis noted within the lung bases. No airspace consolidation. Spondylosis noted within the thoracic spine. IMPRESSION: 1. Mild congestive heart failure. 2. Bibasilar atelectasis. Electronically Signed   By: Kerby Moors M.D.   On: 06/16/2022 10:11    Procedures Procedures    Medications Ordered in ED Medications  allopurinol (ZYLOPRIM) tablet 300 mg (has no administration in time range)  aspirin chewable tablet 81 mg (has no administration in time range)  atorvastatin (LIPITOR) tablet 20 mg (has no administration in time range)  carvedilol (COREG) tablet 6.25 mg (has no administration in time range)  lisinopril (ZESTRIL) tablet 20 mg (has no administration in time range)  ranolazine (RANEXA) 12 hr tablet 1,000 mg (has no administration in time range)  tamsulosin (FLOMAX) capsule 0.4 mg (has no administration in time range)  sodium chloride flush (NS) 0.9 % injection 3 mL (has no administration in time range)  sodium chloride flush (NS) 0.9 % injection 3 mL (has no administration in time range)  0.9 %  sodium chloride infusion (has no administration in time range)  acetaminophen (TYLENOL) tablet 650 mg (has no administration in time range)  ondansetron (ZOFRAN) injection 4 mg (has no administration in time range)  enoxaparin (LOVENOX) injection 40 mg (has no administration in time range)  furosemide (LASIX) injection 40 mg (has no administration in time range)  insulin aspart (novoLOG) injection 0-15 Units (has no administration in time range)  insulin aspart (novoLOG) injection 0-5 Units ( Subcutaneous Not Given 06/16/22 2319)  ferrous sulfate tablet 325 mg (has no administration in time range)  hydrALAZINE (APRESOLINE) tablet 10 mg (has no administration in time range)  gabapentin (NEURONTIN) capsule 300 mg (has no administration in time range)  furosemide (LASIX) injection 40 mg (40 mg Intravenous Given 06/16/22 1820)    ED Course/ Medical Decision Making/ A&P                            Medical Decision Making The patient is an 81 year old male with past medical history of CAD, CABG, CHF, T2DM presenting at the recommendation of his PCP for worsening shortness of breath.  The differential diagnosis  considered includes: CHF exacerbation, COPD, ACS, PE, viral respiratory infection, pneumonia, sepsis.  On arrival, the patient was hypertensive with a blood pressure 186/88 but otherwise hemodynamically stable.   His physical exam was significant for diminished breath sounds in the left lower extremity with a tense abdomen and 2+ bilateral lower extremity edema.    The patient's work-up included a BNP elevated 828; chest x-ray with bilateral pleural effusions; CBC with white blood cell count of 6.1 hemoglobin of 9.9; serial troponins at 9 and pending; and CMP with blood glucose elevated to 278 and creatinine elevated to 1.36 from a baseline of around 1.17.  Based on the patient's history, physical exam, and diagnostic work-up a CHF exacerbation is favored as likely source of his symptoms as he has signs of volume overload on both imaging and physical exam.  Alternative causes such as ACS were considered however given the patient's negative troponins and was determined to be less likely at this time.  Patient was admitted to the hospitalist service for further management.  At the time of his transfer from the emergency department patient had remained hemodynamically stable without an oxygen requirement.   Amount and/or Complexity of Data Reviewed Independent Historian: caregiver    Details: Sons External Data Reviewed: labs, radiology and notes. Labs: ordered. Decision-making details documented in ED Course. Radiology: ordered and independent interpretation performed. Decision-making details documented in ED Course. Discussion of management or test interpretation with external provider(s): Hospitalist  Risk Decision regarding  hospitalization.           Final Clinical Impression(s) / ED Diagnoses Final diagnoses:  SOB (shortness of breath)  Acute on chronic congestive heart failure, unspecified heart failure type [facility])    Rx / DC Orders ED Discharge Orders     None         Dani Gobble, MD 06/16/22 2321    Davonna Belling, MD 06/17/22 1438

## 2022-06-16 NOTE — ED Notes (Signed)
Pt male purewick did not work. Pt sheets saturated. Pt provided with full linen change. Clean chuck placed under pt. Male purewick replaced at this time

## 2022-06-16 NOTE — H&P (Signed)
History and Physical    [name] Macqueen Wildwood Sr. ZTI:[id number] DOB: 05-30-1941 DOA: 06/16/2022  PCP: [name], [name] (Confirm with patient/family/NH records and if not entered, this has to be entered at [facility] point of entry) Patient coming from: Home  I have personally briefly reviewed patient's old medical records in [place]  Chief Complaint: SOB, belly and leg swelled up  HPI: [name] Gross Sr. is a 81 y.o. male with medical history significant of CAD status post CABG, chronic HFpEF, moderate pulmonary hypertension,IIDM, HTN, Gout, CKD stage II, chronic iron deficiency anemia, diabetic neuropathy, presented with increasing shortness of breath and leg swelling.  Symptoms started 3 to 4 days ago, gradually getting worse, symptoms including shortness of breath and bilateral lower extremity swelling and abdominal distention.  Denies any chest pain no fever chills no cough.  Went to see PCP today was found blood pressure significant elevated and suspected left-sided pleural effusion on office x-ray sent to ED.  Denies any changes of his medications.  Patient was given a one-time dose of 40 p.o. Lasix this morning however he observed no significant increase of urine output compared to when he was on 20 mg twice daily.  ED Course: Blood pressure significant elevated SBP 190s, chest x-ray showed cardiomegaly and mild pulmonary congestion.  Blood work showed hemoglobin 9.9, creatinine 1.3, glucose 287, K4.4, patient was given 1 dose of IV Lasix 40 mg.  Review of Systems: As per HPI otherwise 14 point review of systems negative.    Past Medical History:  Diagnosis Date   Arthritis    Back pain    hx   CAD (coronary artery disease)    a. 2004 s/p CABG x 4 (LIMA->LAD, VG->Diag, VG->OM, VG->RCA); b. 2007 Cath: VG->Diag occluded, otw 3/4 patent grafts.   CKD (chronic kidney disease), stage II-III    Colon polyps    DM2 (diabetes mellitus, type 2) ([place])    stable    GERD (gastroesophageal reflux disease)    Glaucoma    Gout    HLD (hyperlipidemia)    HTN (hypertension)    Neuropathy     Past Surgical History:  Procedure Laterality Date   BACK SURGERY     Cyst removed   CORONARY ARTERY BYPASS GRAFT  2004   CORONARY STENT INTERVENTION N/A 09/24/2021   Procedure: CORONARY STENT INTERVENTION;  Surgeon: [name], MD;  Location: [facility];  Service: Cardiovascular;  Laterality: N/A;   KNEE SURGERY     LEFT HEART CATH AND CORONARY ANGIOGRAPHY Left 09/24/2021   Procedure: LEFT HEART CATH AND CORONARY ANGIOGRAPHY;  Surgeon: [name], MD;  Location: [facility];  Service: Cardiovascular;  Laterality: Left;     reports that he has never smoked. His smokeless tobacco use includes chew. He reports that he does not drink alcohol and does not use drugs.  Allergies  Allergen Reactions   Ramipril     Family History  Problem Relation Age of Onset   Stroke Mother      Prior to Admission medications   Medication Sig Start Date End Date Taking? Authorizing Provider  ACCU-CHEK AVIVA PLUS test strip USE AS DIRECTED TO CHECK FASTING BLOOD SUGAR EVERY MORNING 10/15/21   Thedore Mins, Ria Comment, [name]  allopurinol (ZYLOPRIM) 300 MG tablet TAKE 1 TABLET BY MOUTH ONCE DAILY 04/29/22   [name], [name]  aspirin 81 MG chewable tablet Chew 81 mg by mouth daily.    [provider]  atorvastatin (LIPITOR) 20 MG  tablet TAKE 1 TABLET BY MOUTH ONCE DAILY 04/17/22   Jettie Booze, MD  BD SHARPS [facility] MISC Dispose of syringes after B12 injections 10/19/17   Mar Daring, [name]  Blood Glucose Monitoring Suppl (ACCU-CHEK AVIVA PLUS) w/Device KIT To check blood sugar once daily 06/11/17   Fenton Malling M, [name]  carvedilol (COREG) 6.25 MG tablet Take 1 tablet (6.25 mg total) by mouth 2 (two) times daily. Needs appointment for future refills  / 1st attempt 04/29/22   Minna Merritts, MD  dapagliflozin propanediol  (FARXIGA) 10 MG TABS tablet Take 1 tablet (10 mg total) by mouth daily. Please schedule office visit before any future refills. 08/26/21   [name], [name]  furosemide (LASIX) 20 MG tablet TAKE 1 TABLET BY MOUTH AS DIRECTED. TAKE1 TABLET TWICE DAILY 3 DAYS PER WEEK AND1 TABLET ONCE DAILY ON OTHER DAYS. 04/14/22   Furth, Cadence H, [name]  gabapentin (NEURONTIN) 400 MG capsule TAKE 1 CAPSULE BY MOUTH 3 TIMES A DAY 09/19/21   Drubel, Ria Comment, [name]  glipiZIDE (GLUCOTROL XL) 10 MG 24 hr tablet Take 1 tablet (10 mg total) by mouth daily with breakfast. 08/26/21   Drubel, Ria Comment, [name]  isosorbide mononitrate (IMDUR) 120 MG 24 hr tablet Take 1 tablet (120 mg total) by mouth daily. 09/24/21   End, Harrell Gave, MD  JANUVIA 100 MG tablet TAKE 1 TABLET BY MOUTH ONCE DAILY 06/16/22   Thedore Mins, Ria Comment, [name]  lisinopril (ZESTRIL) 20 MG tablet Take 1 tablet (20 mg total) by mouth daily. 02/25/22   [name], [name]  meloxicam (MOBIC) 7.5 MG tablet TAKE 1 TABLET BY MOUTH TWICE DAILY 04/29/22   Thedore Mins, Ria Comment, [name]  metFORMIN (GLUCOPHAGE) 1000 MG tablet TAKE 1 TABLET BY MOUTH TWICE A DAY WITH A MEAL 05/23/22   Drubel, Ria Comment, [name]  nitroGLYCERIN (NITROSTAT) 0.4 MG SL tablet Place 1 tablet (0.4 mg total) under the tongue every 5 (five) minutes as needed for chest pain. 02/17/22   Furth, Cadence H, [name]  potassium chloride (KLOR-CON) 10 MEQ tablet TAKE 1 TABLET BY MOUTH ONCE DAILY 04/29/22   Drubel, Ria Comment, [name]  ranolazine (RANEXA) 1000 MG SR tablet Take 1 tablet (1,000 mg total) by mouth 2 (two) times daily. 02/17/22   Furth, Cadence H, [name]  SYRINGE-NEEDLE, DISP, 3 ML (B-D SYRINGE/NEEDLE 3CC/25GX5/8) 25G X 5/8" 3 ML MISC For use with cyanocobalamin injections 10/19/17   Mar Daring, [name]  tamsulosin (FLOMAX) 0.4 MG CAPS capsule Take 1 capsule (0.4 mg total) by mouth daily. 02/25/22   [name], [name]    Physical Exam: Vitals:   06/16/22 1533 06/16/22 1548 06/16/22 1721  BP: (!) 168/98  (!) 186/88   Pulse: 66  66  Resp: 18  19  Temp: 98.4 F (36.9 C)  98.4 F (36.9 C)  TempSrc: Oral  Oral  SpO2: 99%  98%  Weight:  76.7 kg   Height:  _0  (1.778 m)     Constitutional: NAD, calm, comfortable Vitals:   06/16/22 1533 06/16/22 1548 06/16/22 1721  BP: (!) 168/98  (!) 186/88  Pulse: 66  66  Resp: 18  19  Temp: 98.4 F (36.9 C)  98.4 F (36.9 C)  TempSrc: Oral  Oral  SpO2: 99%  98%  Weight:  76.7 kg   Height:  _1  (1.778 m)    Eyes: PERRL, lids and conjunctivae normal ENMT: Mucous membranes are moist. Posterior pharynx clear of any exudate or lesions.Normal dentition.  Neck: normal, supple, no masses, no  thyromegaly Respiratory: clear to auscultation bilaterally, no wheezing, fine crackles on bilateral lower fields, increasing breathing effort. No accessory muscle use.  Cardiovascular: Regular rate and rhythm, no murmurs / rubs / gallops. 2+ extremity edema. 2+ pedal pulses. No carotid bruits.  Abdomen: no tenderness, distended, positive ascites signs, no masses palpated. No hepatosplenomegaly. Bowel sounds positive.  Musculoskeletal: no clubbing / cyanosis. No joint deformity upper and lower extremities. Good ROM, no contractures. Normal muscle tone.  Skin: no rashes, lesions, ulcers. No induration Neurologic: CN 2-12 grossly intact. Sensation intact, DTR normal. Strength 5/5 in all 4.  Psychiatric: Normal judgment and insight. Alert and oriented x 3. Normal mood.     Labs on Admission: I have personally reviewed following labs and imaging studies  CBC: Recent Labs  Lab 06/16/22 1608  WBC 6.1  NEUTROABS 3.7  HGB 9.9*  HCT 30.4*  MCV 92.1  PLT 970   Basic Metabolic Panel: Recent Labs  Lab 06/16/22 1608  NA 136  K 4.4  CL 103  CO2 24  GLUCOSE 278*  BUN 13  CREATININE 1.36*  CALCIUM 8.7*   GFR: Estimated Creatinine Clearance: 44.7 mL/min (A) (by C-G formula based on SCr of 1.36 mg/dL (H)). Liver Function Tests: Recent Labs  Lab 06/16/22 1608   AST 27  ALT 52*  ALKPHOS 58  BILITOT 0.6  PROT 5.9*  ALBUMIN 3.5   No results for input(s): "LIPASE", "AMYLASE" in the last 168 hours. No results for input(s): "AMMONIA" in the last 168 hours. Coagulation Profile: No results for input(s): "INR", "PROTIME" in the last 168 hours. Cardiac Enzymes: No results for input(s): "CKTOTAL", "CKMB", "CKMBINDEX", "TROPONINI" in the last 168 hours. BNP (last 3 results) No results for input(s): "PROBNP" in the last 8760 hours. HbA1C: No results for input(s): "HGBA1C" in the last 72 hours. CBG: No results for input(s): "GLUCAP" in the last 168 hours. Lipid Profile: No results for input(s): "CHOL", "HDL", "LDLCALC", "TRIG", "CHOLHDL", "LDLDIRECT" in the last 72 hours. Thyroid Function Tests: No results for input(s): "TSH", "T4TOTAL", "FREET4", "T3FREE", "THYROIDAB" in the last 72 hours. Anemia Panel: No results for input(s): "VITAMINB12", "FOLATE", "FERRITIN", "TIBC", "IRON", "RETICCTPCT" in the last 72 hours. Urine analysis:    Component Value Date/Time   BILIRUBINUR negative 02/25/2022 0918   PROTEINUR Negative 02/25/2022 0918   UROBILINOGEN 0.2 02/25/2022 0918   NITRITE negative 02/25/2022 0918   LEUKOCYTESUR Negative 02/25/2022 0918    Radiological Exams on Admission: DG Chest 2 View  Result Date: 06/16/2022 CLINICAL DATA:  History of CHF. Abnormal breath sounds and shortness of breath. EXAM: CHEST - 2 VIEW COMPARISON:  None recent FINDINGS: Previous median sternotomy and CABG procedure. Small bilateral pleural effusions and mild interstitial edema identified. Subsegmental atelectasis noted within the lung bases. No airspace consolidation. Spondylosis noted within the thoracic spine. IMPRESSION: 1. Mild congestive heart failure. 2. Bibasilar atelectasis. Electronically Signed   By: Kerby Moors M.D.   On: 06/16/2022 10:11    EKG: Independently reviewed. Sinus, no acute ST changes.  Assessment/Plan Principal Problem:   CHF  (congestive heart failure) (HCC) Active Problems:   Acute on chronic diastolic CHF (congestive heart failure) ([place])  (please populate well all problems here in Problem List. (For example, if patient is on BP meds at home and you resume or decide to hold them, it is a problem that needs to be her. Same for CAD, COPD, HLD and so on)  Acute on chronic HFpEF decompensation -Likely secondary to uncontrolled hypertension -For now, IV  Lasix 40 mg twice daily -Echocardiogram was done about 3 months ago, showed chronic grade 3 dysfunction and moderate pulmonary hypertension, will not repeat echo at this time. -Given his history of pulmonary hypertension, consider discharge on torsemide other than p.o. Lasix.  Discussed with both sons at bedside, who expressed understanding and agreed.  HTN, uncontrolled -Patient current BP medication including Coreg, given his heart rate borderline low, will not increase Coreg dosage -History on lisinopril 20 mg daily given he has CKD stage II, will keep current ACEI dose -He is on Imdur, will add hydralazine 10 mg 3 times daily -Instructed patient to take his weight 3-4 times a week and/or check blood pressure at home to use as needed diuresis medications. Family agreed  Ascites -Ascites signs on physical exam, probably related to CHF decompensation, check abdominal ultrasound for possible paracentesis.  IIDM, with hyperglycemia -Patient already on 4 p.o. diabetic medications, check A1c -Given that patient will be on Lasix, will hold off metformin -Continue Jardiance continue Farxiga -Sliding scale -Outpatient follow-up with PCP  PAD with stable angina -Continue Ranexa, ASA and statin   DVT prophylaxis: Lovenox Code Status: Full Code Family Communication: none at bedside Disposition Plan: Patient is sick with CHF decompensation, expect more than 2 midnight hospital stay Consults called: None Admission status: Tele admit   Lequita Halt MD [facility] Pager [phone]  06/16/2022, 7:01 PM

## 2022-06-16 NOTE — Progress Notes (Signed)
I,Sha'taria Tyson,acting as a Education administrator for [employer], [name].,have documented all relevant documentation on the behalf of [name], [name],as directed by  [name], [name] while in the presence of [name], [name].   Established patient visit   Patient: [name] Warman Sr.   DOB: 1940-08-07   81 y.o. Male  MRN: [id number] Visit Date: 06/16/2022  Today's healthcare provider: [name], [name]   Cc. DM II f/u  Subjective    HPI   Pt reports increased peripheral edema, SOB--unable to lie flat without being very SOB. Reports he has felt like he was catching a cold the past few days. Denies cough, nasal congestion, fever.  Reports he did not take any of his medications this morning.  Diabetes Mellitus Type II, Follow-up  Lab Results  Component Value Date   HGBA1C 7.5 (H) 03/05/2022   HGBA1C 8.3 (H) 11/22/2021   HGBA1C 7.4 (A) 08/26/2021   Wt Readings from Last 3 Encounters:  06/16/22 169 lb (76.7 kg)  05/16/22 167 lb 3.2 oz (75.8 kg)  03/11/22 169 lb (76.7 kg)   Last seen for diabetes 3 months ago.  Management since then includes continue current treatment. He reports excellent compliance with treatment. He is not having side effects. Symptoms:lower leg edmea No fatigue No foot ulcerations  No appetite changes No nausea  Yes paresthesia of the feet  Yes polydipsia  Yes polyuria No visual disturbances   No vomiting     Home blood sugar records: fasting range: between 100 and under 130  Episodes of hypoglycemia? Yes lowest he has seen recently is 12. Will drop to 70 in the summer.   Current insulin regiment: none Most Recent Eye Exam: Need to update Current exercise: yard work Current diet habits: well balanced  Pertinent Labs: Lab Results  Component Value Date   CHOL 114 05/16/2021   HDL 35 (L) 05/16/2021   LDLCALC 59 05/16/2021   TRIG 104 05/16/2021   CHOLHDL 3.3 05/16/2021   Lab Results  Component Value Date   NA 137 03/05/2022    K 4.5 03/05/2022   CREATININE 1.25 03/05/2022   EGFR 58 (L) 03/05/2022   MICROALBUR 50 07/20/2017   LABMICR <3.0 11/22/2021     ---------------------------------------------------------------------------------------------------   Medications: Outpatient Medications Prior to Visit  Medication Sig   ACCU-CHEK AVIVA PLUS test strip USE AS DIRECTED TO CHECK FASTING BLOOD SUGAR EVERY MORNING   allopurinol (ZYLOPRIM) 300 MG tablet TAKE 1 TABLET BY MOUTH ONCE DAILY   aspirin 81 MG chewable tablet Chew 81 mg by mouth daily.   atorvastatin (LIPITOR) 20 MG tablet TAKE 1 TABLET BY MOUTH ONCE DAILY   BD SHARPS CONTAINER HOME MISC Dispose of syringes after B12 injections   Blood Glucose Monitoring Suppl (ACCU-CHEK AVIVA PLUS) w/Device KIT To check blood sugar once daily   carvedilol (COREG) 6.25 MG tablet Take 1 tablet (6.25 mg total) by mouth 2 (two) times daily. Needs appointment for future refills  / 1st attempt   dapagliflozin propanediol (FARXIGA) 10 MG TABS tablet Take 1 tablet (10 mg total) by mouth daily. Please schedule office visit before any future refills.   furosemide (LASIX) 20 MG tablet TAKE 1 TABLET BY MOUTH AS DIRECTED. TAKE1 TABLET TWICE DAILY 3 DAYS PER WEEK AND1 TABLET ONCE DAILY ON OTHER DAYS.   gabapentin (NEURONTIN) 400 MG capsule TAKE 1 CAPSULE BY MOUTH 3 TIMES A DAY   glipiZIDE (GLUCOTROL XL) 10 MG 24 hr tablet Take 1 tablet (10 mg total)  by mouth daily with breakfast.   isosorbide mononitrate (IMDUR) 120 MG 24 hr tablet Take 1 tablet (120 mg total) by mouth daily.   lisinopril (ZESTRIL) 20 MG tablet Take 1 tablet (20 mg total) by mouth daily.   meloxicam (MOBIC) 7.5 MG tablet TAKE 1 TABLET BY MOUTH TWICE DAILY   metFORMIN (GLUCOPHAGE) 1000 MG tablet TAKE 1 TABLET BY MOUTH TWICE A DAY WITH A MEAL   nitroGLYCERIN (NITROSTAT) 0.4 MG SL tablet Place 1 tablet (0.4 mg total) under the tongue every 5 (five) minutes as needed for chest pain.   potassium chloride (KLOR-CON) 10 MEQ  tablet TAKE 1 TABLET BY MOUTH ONCE DAILY   ranolazine (RANEXA) 1000 MG SR tablet Take 1 tablet (1,000 mg total) by mouth 2 (two) times daily.   sitaGLIPtin (JANUVIA) 100 MG tablet Take 1 tablet (100 mg total) by mouth daily.   SYRINGE-NEEDLE, DISP, 3 ML (B-D SYRINGE/NEEDLE 3CC/25GX5/8) 25G X 5/8" 3 ML MISC For use with cyanocobalamin injections   tamsulosin (FLOMAX) 0.4 MG CAPS capsule Take 1 capsule (0.4 mg total) by mouth daily.   Facility-Administered Medications Prior to Visit  Medication Dose Route Frequency Provider   sodium chloride flush (NS) 0.9 % injection 3 mL  3 mL Intravenous Q12H Furth, Cadence H, [name]    Review of Systems  Constitutional:  Negative for fatigue and fever.  Respiratory:  Positive for shortness of breath. Negative for cough.   Cardiovascular:  Positive for leg swelling. Negative for chest pain and palpitations.  Neurological:  Negative for dizziness and headaches.      Objective    Blood pressure (!) 191/94, pulse 68, height _0  (1.778 m), weight 169 lb (76.7 kg), SpO2 98 %.   Physical Exam Constitutional:      General: He is awake.     Appearance: He is well-developed.  HENT:     Head: Normocephalic.  Eyes:     Conjunctiva/sclera: Conjunctivae normal.  Cardiovascular:     Rate and Rhythm: Normal rate and regular rhythm.     Heart sounds: Normal heart sounds.  Pulmonary:     Effort: Pulmonary effort is normal.     Breath sounds: Examination of the left-lower field reveals rhonchi. Rhonchi present. No wheezing.  Musculoskeletal:     Right lower leg: Edema present.     Left lower leg: Edema present.     Comments: Non pitting edema b/l lower extremities  Skin:    General: Skin is warm.  Neurological:     Mental Status: He is alert and oriented to person, place, and time.  Psychiatric:        Attention and Perception: Attention normal.        Mood and Affect: Mood normal.        Speech: Speech normal.        Behavior: Behavior is  cooperative.     No results found for any visits on 06/16/22.  Assessment & Plan     Problem List Items Addressed This Visit       Cardiovascular and Mediastinum   Hypertension associated with diabetes ([place])    Acutely elevated  Pt has not taken meds today Given CHF exacerbation increasing lasix dose Will see back in 1 week and determine further bp management form there if needed      Chronic systolic heart failure ([place])    Current exacerbation Advised pt to increase lasix to 40 mg in AM and 20 mg in PM Ordered chest xray d/t  crackles base of L lung Ordered bmp,bnp,cbc O2 at rest 98% strict ED recommendations  F/u 1 week        Endocrine   Type 2 diabetes mellitus with diabetic neuropathy, unspecified ([place]) - Primary    Managed with farxiga 10 mg glipizide 10 mg, metformin 1000 mg BID, januvia 100 mg.  Last A1c 7.5%, ordered today. Foot exam UTD Uacr utd Optho utd Given hypoglycemia, consider dec. Glipizide dose pending A1c. F/u 4 mo       Relevant Orders   HgB A1c   Other Visit Diagnoses     Orthopnea       Relevant Orders   Basic Metabolic Panel (BMET)   B Nat Peptide   CBC w/Diff/Platelet   DG Chest 2 View       Return in about 1 week (around 06/23/2022) for CHF.      I, [name], [name] have reviewed all documentation for this visit. The documentation on  06/16/2022  for the exam, diagnosis, procedures, and orders are all accurate and complete.  [name], [name] [facility] [address] [place], [place], [address] Office: [phone] Fax: [place]

## 2022-06-16 NOTE — Telephone Encounter (Signed)
  Chief Complaint: Difficulty breathing Symptoms: Legs are swollen  - difficulty breathing Frequency: ongoing - worsening Pertinent Negatives: Patient denies  Disposition: [x] ED /[] Urgent Care (no appt availability in office) / [] Appointment(In office/virtual)/ []  [place] [facility]/ [] Home Care/ [x] Refused Recommended Disposition /[] [place] [facility]/ []  Follow-up with PCP Additional Notes: Received call from , pt's girlfriend. Permission to speak to obtained from pt. PT 's leg are very swollen and pt is having difficulty breathing. Pt will wait to be taken to ED by son. Read note from provider regarding Xray results.   , [name] 06/16/2022 12:52 PM EST     There is some fluid in his lungs. If he does not feel better with the higher lasix dose-- any continued shortness of breath, chest tightness, I recommend he goes to the emergency room for treatment !   Girlfriend does not think she can get pt to go to ed, or call EMS.     Reason for Disposition  [1] MODERATE difficulty breathing (e.g., speaks in phrases, SOB even at rest, pulse 100-120) AND [2] NEW-onset or WORSE than normal  Answer Assessment - Initial Assessment Questions 1. RESPIRATORY STATUS: "Describe your breathing?" (e.g., wheezing, shortness of breath, unable to speak, severe coughing)      Shortness of breath 2. ONSET: "When did this breathing problem begin?"      ongoing 3. PATTERN "Does the difficult breathing come and go, or has it been constant since it started?"      continuous 4. SEVERITY: "How bad is your breathing?" (e.g., mild, moderate, severe)    - MILD: No SOB at rest, mild SOB with walking, speaks normally in sentences, can lie down, no retractions, pulse < 100.    - MODERATE: SOB at rest, SOB with minimal exertion and prefers to sit, cannot lie down flat, speaks in phrases, mild retractions, audible wheezing, pulse 100-120.    - SEVERE: Very SOB at rest, speaks in  single words, struggling to breathe, sitting hunched forward, retractions, pulse > 120      moderate 5. RECURRENT SYMPTOM: "Have you had difficulty breathing before?" If Yes, ask: "When was the last time?" and "What happened that time?"      yes 6. CARDIAC HISTORY: "Do you have any history of heart disease?" (e.g., heart attack, angina, bypass surgery, angioplasty)      yes 7. LUNG HISTORY: "Do you have any history of lung disease?"  (e.g., pulmonary embolus, asthma, emphysema)      8. CAUSE: "What do you think is causing the breathing problem?"      Fluid in lungs 9. OTHER SYMPTOMS: "Do you have any other symptoms? (e.g., dizziness, runny nose, cough, chest pain, fever)      10. O2 SATURATION MONITOR:  "Do you use an oxygen saturation monitor (pulse oximeter) at home?" If Yes, ask: "What is your reading (oxygen level) today?" "What is your usual oxygen saturation reading?" (e.g., 95%)        11. PREGNANCY: "Is there any chance you are pregnant?" "When was your last menstrual period?"        12. TRAVEL: "Have you traveled out of the country in the last month?" (e.g., travel history, exposures)  Protocols used: Breathing Difficulty-A-AH

## 2022-06-16 NOTE — ED Provider Triage Note (Signed)
Emergency Medicine Provider Triage Evaluation Note  [name] , a 81 y.o. male  was evaluated in triage.  Pt complains of shob x 2 days.  Patient saw his primary care earlier today who ordered chest x-ray with signs concerning for CHF exacerbation.  She told him to double his Lasix of which she did but is had no symptomatic relief.  He notes increasing lower extremity swelling.  Denies chest pain, cough, congestion, abdominal pain, nausea, vomiting, fever, chills, night sweats.  Review of Systems  Positive: See above Negative:   Physical Exam  BP (!) 168/98 (BP Location: Right Arm)   Pulse 66   Temp 98.4 F (36.9 C) (Oral)   Resp 18   Ht 5\' 10"  (1.778 m)   Wt 76.7 kg   SpO2 99%   BMI 24.25 kg/m  Gen:   Awake, no distress   Resp:  Normal effort  MSK:   Moves extremities without difficulty  Other:  Rales auscultated bilaterally.  2-3+ bilaterally pitting edema.  Medical Decision Making  Medically screening exam initiated at 4:01 PM.  Appropriate orders placed.  Bennison Sr. was informed that the remainder of the evaluation will be completed by another provider, this initial triage assessment does not replace that evaluation, and the importance of remaining in the ED until their evaluation is complete.     Delynn Flavin, Peter Garter 06/16/22 1605

## 2022-06-17 ENCOUNTER — Other Ambulatory Visit (HOSPITAL_COMMUNITY): Payer: Self-pay

## 2022-06-17 DIAGNOSIS — I5033 Acute on chronic diastolic (congestive) heart failure: Secondary | ICD-10-CM | POA: Diagnosis not present

## 2022-06-17 DIAGNOSIS — E1169 Type 2 diabetes mellitus with other specified complication: Secondary | ICD-10-CM | POA: Diagnosis not present

## 2022-06-17 DIAGNOSIS — E785 Hyperlipidemia, unspecified: Secondary | ICD-10-CM

## 2022-06-17 DIAGNOSIS — I10 Essential (primary) hypertension: Secondary | ICD-10-CM | POA: Diagnosis not present

## 2022-06-17 DIAGNOSIS — N1831 Chronic kidney disease, stage 3a: Secondary | ICD-10-CM

## 2022-06-17 LAB — BASIC METABOLIC PANEL
Anion gap: 11 (ref 5–15)
BUN/Creatinine Ratio: 10 (ref 10–24)
BUN: 13 mg/dL (ref 8–27)
BUN: 16 mg/dL (ref 8–23)
CO2: 21 mmol/L (ref 20–29)
CO2: 23 mmol/L (ref 22–32)
Calcium: 9.4 mg/dL (ref 8.6–10.2)
Calcium: 8.9 mg/dL (ref 8.9–10.3)
Chloride: 103 mmol/L (ref 96–106)
Chloride: 105 mmol/L (ref 98–111)
Creatinine, Ser: 1.31 mg/dL — ABNORMAL HIGH (ref 0.76–1.27)
Creatinine, Ser: 1.32 mg/dL — ABNORMAL HIGH (ref 0.61–1.24)
GFR, Estimated: 55 mL/min — ABNORMAL LOW (ref >60)
Glucose, Bld: 154 mg/dL — ABNORMAL HIGH (ref 70–99)
Glucose: 200 mg/dL — ABNORMAL HIGH (ref 70–99)
Potassium: 5.3 mmol/L — ABNORMAL HIGH (ref 3.5–5.2)
Potassium: 4.1 mmol/L (ref 3.5–5.1)
Sodium: 138 mmol/L (ref 134–144)
Sodium: 139 mmol/L (ref 135–145)
eGFR: 55 mL/min/{1.73_m2} — ABNORMAL LOW (ref >59)

## 2022-06-17 LAB — CBC WITH DIFFERENTIAL/PLATELET
Basophils Absolute: 0.1 10*3/uL (ref 0.0–0.2)
Basos: 1 %
EOS (ABSOLUTE): 0.1 10*3/uL (ref 0.0–0.4)
Eos: 2 %
Hematocrit: 31.8 % — ABNORMAL LOW (ref 37.5–51.0)
Hemoglobin: 10.5 g/dL — ABNORMAL LOW (ref 13.0–17.7)
Immature Grans (Abs): 0 10*3/uL (ref 0.0–0.1)
Immature Granulocytes: 0 %
Lymphocytes Absolute: 1.8 10*3/uL (ref 0.7–3.1)
Lymphs: 27 %
MCH: 28.9 pg (ref 26.6–33.0)
MCHC: 33 g/dL (ref 31.5–35.7)
MCV: 88 fL (ref 79–97)
Monocytes Absolute: 0.4 10*3/uL (ref 0.1–0.9)
Monocytes: 7 %
Neutrophils Absolute: 4.1 10*3/uL (ref 1.4–7.0)
Neutrophils: 63 %
Platelets: 226 10*3/uL (ref 150–450)
RBC: 3.63 x10E6/uL — ABNORMAL LOW (ref 4.14–5.80)
RDW: 15 % (ref 11.6–15.4)
WBC: 6.4 10*3/uL (ref 3.4–10.8)

## 2022-06-17 LAB — GLUCOSE, CAPILLARY
Glucose-Capillary: 170 mg/dL — ABNORMAL HIGH (ref 70–99)
Glucose-Capillary: 223 mg/dL — ABNORMAL HIGH (ref 70–99)
Glucose-Capillary: 143 mg/dL — ABNORMAL HIGH (ref 70–99)

## 2022-06-17 LAB — CBG MONITORING, ED
Glucose-Capillary: 159 mg/dL — ABNORMAL HIGH (ref 70–99)
Glucose-Capillary: 184 mg/dL — ABNORMAL HIGH (ref 70–99)

## 2022-06-17 LAB — BRAIN NATRIURETIC PEPTIDE: BNP: 543.2 pg/mL — ABNORMAL HIGH (ref 0.0–100.0)

## 2022-06-17 LAB — HEMOGLOBIN A1C
Est. average glucose Bld gHb Est-mCnc: 169 mg/dL
Hgb A1c MFr Bld: 7.5 % — ABNORMAL HIGH (ref 4.8–5.6)

## 2022-06-17 MED ORDER — EMPAGLIFLOZIN 10 MG PO TABS
10.0000 mg | ORAL_TABLET | Freq: Every day | ORAL | Status: DC
  Administered 2022-06-17 – 2022-06-19 (×3): 10 mg via ORAL
  Filled 2022-06-17 (×3): qty 1

## 2022-06-17 NOTE — Evaluation (Signed)
Physical Therapy Evaluation Patient Details Name: [name] Raborn Sr. MRN: [id number] DOB: January 02, 1941 Today's Date: 06/17/2022  History of Present Illness  81 yo M  presented to the ED following PCP visit on 11/20 with shortness of breath, orthopnea, abdominal and LE edema. Found to have suspected left sided pleural effusion in office xray. CXR here with mild CHF and bibasilar atelectasis. with PMH of CAD s/p CABG, CHF, HTN, HLD, T2DM.  Clinical Impression  Pt admitted with above diagnosis. Pt was able to ambulate without assist and son and pt state that ambulation is at baseline.  Pt did desaturate to 89% on RA however improved with pursed lip breathing. Pt should progress well and will follow to ensure mobility.  Pt currently with functional limitations due to the deficits listed below (see PT Problem List). Pt will benefit from skilled PT to increase their independence and safety with mobility to allow discharge to the venue listed below.          Recommendations for follow up therapy are one component of a multi-disciplinary discharge planning process, led by the attending physician.  Recommendations may be updated based on patient status, additional functional criteria and insurance authorization.  Follow Up Recommendations No PT follow up      Assistance Recommended at Discharge PRN  Patient can return home with the following       Equipment Recommendations None recommended by PT  Recommendations for Other Services       Functional Status Assessment Patient has had a recent decline in their functional status and demonstrates the ability to make significant improvements in function in a reasonable and predictable amount of time.     Precautions / Restrictions Precautions Precautions: Fall Restrictions Weight Bearing Restrictions: No      Mobility  Bed Mobility Overal bed mobility: Independent             General bed mobility comments: Pt able to don and doff  shoes on his own.    Transfers Overall transfer level: Independent                      Ambulation/Gait Ambulation/Gait assistance: Supervision, Independent Gait Distance (Feet): 350 Feet Assistive device: None Gait Pattern/deviations: Step-through pattern, Decreased step length - right, Shuffle, Decreased dorsiflexion - right   Gait velocity interpretation: 1.31 - 2.62 ft/sec, indicative of limited community ambulator   General Gait Details: Pt without significant LOB with ambulation with min challenges.  Noted that at times pt with right foot shuffling but no LOB noted. Discussed with pt and son and they both agree pt is at his baseline.  Pt on RA and was 96% at rest and did desat to 89% during walk once but with pursed lip breathing able to keep sats above 94%.  Stairs Stairs:  (declined to practice)          Engineer, drilling Rankin (Stroke Patients Only)       Balance                                 Standardized Balance Assessment Standardized Balance Assessment : Dynamic Gait Index   Dynamic Gait Index Level Surface: Normal Change in Gait Speed: Normal Gait with Horizontal Head Turns: Normal Gait with Vertical Head Turns: Normal Gait and Pivot Turn: Mild Impairment Step Over Obstacle: Mild Impairment Step Around Obstacles: Normal Steps: Moderate Impairment Total Score:  20       Pertinent Vitals/Pain Pain Assessment Pain Assessment: No/denies pain    Home Living Family/patient expects to be discharged to:: Private residence Living Arrangements: Alone Available Help at Discharge: Family;Available PRN/intermittently Type of Home: House Home Access: Stairs to enter Entrance Stairs-Rails: None Entrance Stairs-Number of Steps: 3   Home Layout: One level Home Equipment: Agricultural consultant (2 wheels);Cane - single point;Shower seat;Hand held shower head;Hospital bed      Prior Function Prior Level of Function :  Independent/Modified Independent;Driving;Working/employed (Farms full time, still Engineer, production, public job parttime)             Mobility Comments: No falls ADLs Comments: I PTA     Hand Dominance   Dominant Hand: Right    Extremity/Trunk Assessment   Upper Extremity Assessment Upper Extremity Assessment: Defer to OT evaluation    Lower Extremity Assessment Lower Extremity Assessment: Generalized weakness (right hip 3/5)    Cervical / Trunk Assessment Cervical / Trunk Assessment: Normal  Communication   Communication: HOH  Cognition Arousal/Alertness: Awake/alert Behavior During Therapy: WFL for tasks assessed/performed Overall Cognitive Status: Within Functional Limits for tasks assessed                                          General Comments General comments (skin integrity, edema, etc.): VSS    Exercises     Assessment/Plan    PT Assessment Patient needs continued PT services  PT Problem List Decreased activity tolerance;Decreased balance;Decreased mobility;Decreased knowledge of use of DME;Decreased safety awareness       PT Treatment Interventions DME instruction;Gait training;Functional mobility training;Stair training;Therapeutic activities;Therapeutic exercise;Balance training;Patient/family education    PT Goals (Current goals can be found in the Care Plan section)  Acute Rehab PT Goals Patient Stated Goal: to go home PT Goal Formulation: With patient/family Time For Goal Achievement: 07/01/22 Potential to Achieve Goals: Good    Frequency Min 3X/week     Co-evaluation               AM-PAC PT "6 Clicks" Mobility  Outcome Measure Help needed turning from your back to your side while in a flat bed without using bedrails?: None Help needed moving from lying on your back to sitting on the side of a flat bed without using bedrails?: None Help needed moving to and from a bed to a chair (including a wheelchair)?:  None Help needed standing up from a chair using your arms (e.g., wheelchair or bedside chair)?: None Help needed to walk in hospital room?: A Little Help needed climbing 3-5 steps with a railing? : A Little 6 Click Score: 22    End of Session Equipment Utilized During Treatment: Gait belt Activity Tolerance: Patient tolerated treatment well Patient left: with call bell/phone within reach;with family/visitor present (on stretcher) Nurse Communication: Mobility status PT Visit Diagnosis: Muscle weakness (generalized) (M62.81)    Time: 0102-7253 PT Time Calculation (min) (ACUTE ONLY): 29 min   Charges:   PT Evaluation $PT Eval Low Complexity: 1 Low PT Treatments $Gait Training: 8-22 mins        [facility] M,PT Acute Rehab Services [phone]   Bevelyn Buckles 06/17/2022, 11:35 AM

## 2022-06-17 NOTE — ED Notes (Signed)
Pt placed on 2L while sleeping - O2 at 89-90% while sleeping

## 2022-06-17 NOTE — Progress Notes (Signed)
Orthopedic Tech Progress Note Patient Details:  [name] Najjar Sr. Dec 11, 1940 [id number]  Ortho Devices Type of Ortho Device: Roland Rack boot Ortho Device/Splint Location: BLE Ortho Device/Splint Interventions: Ordered, Application   Post Interventions Patient Tolerated: Well  [name] 06/17/2022, 12:22 PM

## 2022-06-17 NOTE — Assessment & Plan Note (Addendum)
Echocardiogram with preserved LV systolic function with EF 50 to 55%, mild LVH, RV systolic function with mild reduction, elevated LA pressure, RVSP 49,1, mild to moderate TR   Urine output is not documented but his edema is improving. Systolic blood pressure is 151 to 160 mmHg.   Plan to continue diuresis with furosemide IV Continue with carvedilol and hydralazine.  Hold on lisinopril for now until renal function more stable.  Add SGLT 2 inh to augment diuresis.

## 2022-06-17 NOTE — Hospital Course (Signed)
[name] was admitted to the hospital with the working diagnosis of decompensated heart failure.   81 yo male with the past medical history of coronary artery disease, heart failure, pulmonary hypertension, T2DM, and chronic kidney disease, who presented with dyspnea, abdominal distention and lower extremity edema. Reported 3 to 4 days of worsening symptoms, patient was evaluated by his primary care provider, found in volume overload and hypertensive. He was referred to the ED for further evaluation. On his initial physical examination his blood pressure was 168/98, HR 66, RR 18 and 02 saturation 98%, lungs with rales and increased work of breathing, heart with S1 and S2 present and rhythmic with no gallops, rubs or murmurs, abdomen with distention and signs of ascites, positive lower extremity edema.   Na 136, K 4,4 Cl 103 bicarbonate 24 glucose 278 bun 13 cr 1,36 BNP 828  High sensitive troponin 9 and 10  Wbc 6.1 hgb 9,9 plt  209  Sars covid 19 negative   Chest radiograph with cardiomegaly, bilateral hilar vascular congestion with cephalization of the vasculature, and small bilateral pleural effusions. Sternotomy wires in place.  EKG 62 bpm, normal axis, right bundle branch block, sinus rhythm with no significant ST segment or T wave changes.   Abdominal US with no ascites.   Patient was placed on IV furosemide for diuresis.

## 2022-06-17 NOTE — Progress Notes (Deleted)
Heart Failure Navigator Progress Note  Assessed for Heart & Vascular TOC clinic readiness.  Patient EF 50-55% , EP device , not Acute heart failure.  Navigator will sign off at this time. Rhae Hammock, BSN, RN Heart Failure Teacher, adult education Only

## 2022-06-17 NOTE — Assessment & Plan Note (Signed)
Continue blood pressure control with carvedilol Continue with IV furosemide Hold on lisinopril for now, if renal function stable, will transition to ARB.

## 2022-06-17 NOTE — Assessment & Plan Note (Signed)
Renal function with serum cr at 1,32 with K at 4,1 and serum bicarbonate at 23. Plan to continue diuresis with furosemide IV  Follow up renal function and electrolytes in am.

## 2022-06-17 NOTE — Progress Notes (Incomplete)
PROGRESS NOTE    [name] Dolinger [place] Sr.  DEY:[id number] DOB: 1941/02/27 DOA: 06/16/2022 PCP: Alfredia Ferguson, [name]   81/M  with history of CAD, diastolic CHF, pulmonary hypertension, T2DM, and chronic kidney disease 2, who presented with dyspnea, abdominal distention and lower extremity edema. Labs, w/ cr 1,36, BNP 828,  troponin 9 and 10  CXR with cardiomegaly, bilateral hilar vascular congestion with cephalization of the vasculature, and small bilateral pleural effusions. Abdominal US with no ascites.    Patient was placed on IV furosemide for diuresis.   Subjective:   Assessment and Plan:  Acute on chronic diastolic CHF (congestive heart failure) (HCC) Echo with preserved LV systolic function with EF 50 to 55%, mild LVH, RV systolic function with mild reduction -continue IV lasix, jardiance -Continue with carvedilol and hydralazine -[place] without ascites -hold lisinopril  Essential hypertension Continue blood pressure control with carvedilol Continue with IV furosemide  Chronic kidney disease, stage 3a (HCC) -stable, baseline creat 1.2  Type 2 diabetes mellitus  -stable, SSI  Normocytic anemia   DVT prophylaxis:lovenox Code Status: Full Code Family Communication: Disposition Plan:   Consultants:    Procedures:   Antimicrobials:    Objective: Vitals:   06/17/22 1100 06/17/22 1200 06/17/22 1254 06/17/22 1404  BP: (!) 152/72 (!) 161/76 (!) 160/82 (!) 151/79  Pulse: 60 62 65   Resp: (!) 23 (!) 23 18   Temp: 98 F (36.7 C)  98.1 F (36.7 C)   TempSrc: Oral  Oral   SpO2: 96% 98% 96%   Weight:      Height:        Intake/Output Summary (Last 24 hours) at 06/17/2022 1622 Last data filed at 06/17/2022 1600 Gross per 24 hour  Intake --  Output 1700 ml  Net -1700 ml   Filed Weights   06/16/22 1548  Weight: 76.7 kg    Examination:    Data Reviewed:   CBC: Recent Labs  Lab 06/16/22 0917 06/16/22 1608  WBC 6.4 6.1  NEUTROABS 4.1 3.7   HGB 10.5* 9.9*  HCT 31.8* 30.4*  MCV 88 92.1  PLT 226 209   Basic Metabolic Panel: Recent Labs  Lab 06/16/22 0917 06/16/22 1608 06/17/22 0500  NA 138 136 139  K 5.3* 4.4 4.1  CL 103 103 105  CO2 21 24 23   GLUCOSE 200* 278* 154*  BUN 13 13 16   CREATININE 1.31* 1.36* 1.32*  CALCIUM 9.4 8.7* 8.9   GFR: Estimated Creatinine Clearance: 46.1 mL/min (A) (by C-G formula based on SCr of 1.32 mg/dL (H)). Liver Function Tests: Recent Labs  Lab 06/16/22 1608  AST 27  ALT 52*  ALKPHOS 58  BILITOT 0.6  PROT 5.9*  ALBUMIN 3.5   No results for input(s): "LIPASE", "AMYLASE" in the last 168 hours. No results for input(s): "AMMONIA" in the last 168 hours. Coagulation Profile: No results for input(s): "INR", "PROTIME" in the last 168 hours. Cardiac Enzymes: No results for input(s): "CKTOTAL", "CKMB", "CKMBINDEX", "TROPONINI" in the last 168 hours. BNP (last 3 results) No results for input(s): "PROBNP" in the last 8760 hours. HbA1C: Recent Labs    06/16/22 0917  HGBA1C 7.5*   CBG: Recent Labs  Lab 06/16/22 2319 06/17/22 0742 06/17/22 1211 06/17/22 1303 06/17/22 1551  GLUCAP 134* 159* 184* 170* 223*   Lipid Profile: No results for input(s): "CHOL", "HDL", "LDLCALC", "TRIG", "CHOLHDL", "LDLDIRECT" in the last 72 hours. Thyroid Function Tests: No results for input(s): "TSH", "T4TOTAL", "FREET4", "T3FREE", "THYROIDAB" in the last 72  hours. Anemia Panel: No results for input(s): "VITAMINB12", "FOLATE", "FERRITIN", "TIBC", "IRON", "RETICCTPCT" in the last 72 hours. Urine analysis:    Component Value Date/Time   BILIRUBINUR negative 02/25/2022 0918   PROTEINUR Negative 02/25/2022 0918   UROBILINOGEN 0.2 02/25/2022 0918   NITRITE negative 02/25/2022 0918   LEUKOCYTESUR Negative 02/25/2022 0918   Sepsis Labs: @LABRCNTIP (procalcitonin:4,lacticidven:4)  ) Recent Results (from the past 240 hour(s))  Resp Panel by RT-PCR (Flu A&B, Covid) Anterior Nasal Swab     Status:  None   Collection Time: 06/16/22  4:08 PM   Specimen: Anterior Nasal Swab  Result Value Ref Range Status   SARS Coronavirus 2 by RT PCR NEGATIVE NEGATIVE Final    Comment: (NOTE) SARS-CoV-2 target nucleic acids are NOT DETECTED.  The SARS-CoV-2 RNA is generally detectable in upper respiratory specimens during the acute phase of infection. The lowest concentration of SARS-CoV-2 viral copies this assay can detect is 138 copies/mL. A negative result does not preclude SARS-Cov-2 infection and should not be used as the sole basis for treatment or other patient management decisions. A negative result may occur with  improper specimen collection/handling, submission of specimen other than nasopharyngeal swab, presence of viral mutation(s) within the areas targeted by this assay, and inadequate number of viral copies(<138 copies/mL). A negative result must be combined with clinical observations, patient history, and epidemiological information. The expected result is Negative.  Fact Sheet for Patients:  06/18/22  Fact Sheet for Healthcare Providers:  BloggerCourse.com  This test is no t yet approved or cleared by the [employer].[place] FDA and  has been authorized for detection and/or diagnosis of SARS-CoV-2 by FDA under an Emergency Use Authorization (EUA). This EUA will remain  in effect (meaning this test can be used) for the duration of the COVID-19 declaration under Section 564(b)(1) of the Act, 21 U.S.C.section 360bbb-3(b)(1), unless the authorization is terminated  or revoked sooner.       Influenza A by PCR NEGATIVE NEGATIVE Final   Influenza B by PCR NEGATIVE NEGATIVE Final    Comment: (NOTE) The Xpert Xpress SARS-CoV-2/FLU/RSV plus assay is intended as an aid in the diagnosis of influenza from Nasopharyngeal swab specimens and should not be used as a sole basis for treatment. Nasal washings and aspirates are unacceptable  for Xpert Xpress SARS-CoV-2/FLU/RSV testing.  Fact Sheet for Patients: [place]  Fact Sheet for Healthcare Providers: BloggerCourse.com  This test is not yet approved or cleared by the [employer]ious[employer]ker.[place] FDA and has been authorized for detection and/or diagnosis of SARS-CoV-2 by FDA under an Emergency Use Authorization (EUA). This EUA will remain in effect (meaning this test can be used) for the duration of the COVID-19 declaration under Section 564(b)(1) of the Act, 21 U.S.C. section 360bbb-3(b)(1), unless the authorization is terminated or revoked.  Performed at [facility], [address], [place], [place]      Radiology Studies: 96222 ASCITES (ABDOMEN LIMITED)  Result Date: 06/16/2022 CLINICAL DATA:  CHF. EXAM: LIMITED ABDOMEN ULTRASOUND FOR ASCITES TECHNIQUE: Limited ultrasound survey for ascites was performed in all four abdominal quadrants. COMPARISON:  None Available. FINDINGS: No ascites visualized in the 4 abdominal quadrants. There is a right pleural effusion. IMPRESSION: No ascites. Right pleural effusion. Electronically Signed   By: 06/18/2022 M.D.   On: 06/16/2022 22:19   DG Chest 2 View  Result Date: 06/16/2022 CLINICAL DATA:  History of CHF. Abnormal breath sounds and shortness of breath. EXAM: CHEST - 2 VIEW COMPARISON:  None  recent FINDINGS: Previous median sternotomy and CABG procedure. Small bilateral pleural effusions and mild interstitial edema identified. Subsegmental atelectasis noted within the lung bases. No airspace consolidation. Spondylosis noted within the thoracic spine. IMPRESSION: 1. Mild congestive heart failure. 2. Bibasilar atelectasis. Electronically Signed   By: Signa Kell M.D.   On: 06/16/2022 10:11     Scheduled Meds:  allopurinol  300 mg Oral Daily   aspirin  81 mg Oral Daily   atorvastatin  20 mg Oral Daily   carvedilol  6.25 mg Oral BID   empagliflozin  10 mg Oral  Daily   enoxaparin (LOVENOX) injection  40 mg Subcutaneous Q24H   ferrous sulfate  325 mg Oral Q breakfast   furosemide  40 mg Intravenous BID   gabapentin  300 mg Oral TID   hydrALAZINE  10 mg Oral Q8H   insulin aspart  0-15 Units Subcutaneous TID WC   insulin aspart  0-5 Units Subcutaneous QHS   ranolazine  1,000 mg Oral BID   sodium chloride flush  3 mL Intravenous Q12H   tamsulosin  0.4 mg Oral Daily   Continuous Infusions:  sodium chloride       LOS: 1 day    Time spent:    Zannie Cove, MD [facility]   06/17/2022, 4:22 PM

## 2022-06-17 NOTE — Progress Notes (Addendum)
   Heart Failure Stewardship Pharmacist Progress Note   PCP: Alfredia Ferguson, [name] PCP-Cardiologist: Julien Nordmann, MD    HPI:  81 yo M with PMH of CAD s/p CABG, CHF, HTN, HLD, T2DM.  Had CABG in 2004. Lexiscan myoview study in 07/2019 with potentially high risk study, nearly completely reversible defect involving the basal and mid inferior and inferoseptal myocardium consistent with ischemia and possibly and small area of scar. ECHO at that time with EF 50-55%. Repeat stress test 08/2021 with moderate risk. EF estimated at 43%. LHC later that month showed multivessel CAD, recommending medical management.  He presented to the ED following PCP visit on 11/20 with shortness of breath, orthopnea, abdominal and LE edema. Found to have suspected left sided pleural effusion in office xray. CXR here with mild CHF and bibasilar atelectasis. Last ECHO 02/19/22 showed LVEF 50-55%, no regional wall motion abnormalities, mild LVH, G3DD, RV mildly reduced.   Current HF Medications: Diuretic: furosemide 40 mg IV BID Beta Blocker: carvedilol 6.25 mg BID ACE/ARB/ARNI: lisinopril 20 mg daily  Prior to admission HF Medications: Diuretic: furosemide 40 mg AM and 20 mg PM Beta blocker: carvedilol 6.25 mg BID ACE/ARB/ARNI: lisinopril 20 mg daily *not taking [place] or Imdur (unsure of the reason why)  Pertinent Lab Values: Serum creatinine 1.32, BUN 16, Potassium 4.1, Sodium 139, BNP 828.8, A1c 7.5  Vital Signs: Weight: reported 169 lbs Blood pressure: 160/70s  Heart rate: 60-70s  I/O: not yet documented  Medication Assistance / Insurance Benefits Check: Does the patient have prescription insurance?  Yes Type of insurance plan: [employer] Medicare + State Health Plan  Outpatient Pharmacy:  Prior to admission outpatient pharmacy: [facility] Is the patient willing to use [facility] TOC pharmacy at discharge? Yes Is the patient willing to transition their outpatient pharmacy to utilize a [facility]  outpatient pharmacy?   Pending    Assessment: 1. 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 NYHA class III symptoms. - Remains volume overloaded on exam. Continue furosemide 40 mg IV BID. Strict I/Os and daily weights. Keep K>4. Check magnesium with AM labs. May need to be switched to torsemide at discharge for better gut absorption.  - Continue carvedilol 6.25 mg BID - On lisinopril 20 mg daily, consider switching to losartan x 1 day for the goal of optimizing to Entresto 49/51 mg BID - Consider adding spironolactone 25 mg daily to optimize GDMT. SBP 160s and renal function stable. Should be able to tolerate target dose.  - Consider restarting Farxiga 10 mg daily   Plan: 1) Medication changes recommended at this time: - Check magnesium in AM - Switch lisinopril to losartan x 1 day, then start Entresto 49/51 mg BID - Start spironolactone 25 mg daily - TED hose  2) Patient assistance: - Has Medicare + State Health plan, can use monthly copay cards to lower cost - Entresto copay with card is $10 - [place] copay with card is $0  3)  Education  - Initial education completed - Full education to be completed prior to discharge  Sharen Hones, PharmD, BCPS Heart Failure Engineer, building services Phone [phone]

## 2022-06-17 NOTE — Progress Notes (Signed)
Progress Note   Patient: [name]. JQZ:[id number] DOB: [birth year] DOA: 06/16/2022     1 DOS: the patient was seen and examined on 06/17/2022   Brief hospital course: [name] was admitted to the hospital with the working diagnosis of decompensated heart failure.   81 yo male with the past medical history of coronary artery disease, heart failure, pulmonary hypertension, T2DM, and chronic kidney disease, who presented with dyspnea, abdominal distention and lower extremity edema. Reported 3 to 4 days of worsening symptoms, patient was evaluated by his primary care provider, found in volume overload and hypertensive. He was referred to the ED for further evaluation. On his initial physical examination his blood pressure was 168/98, HR 66, RR 18 and 02 saturation 98%, lungs with rales and increased work of breathing, heart with S1 and S2 present and rhythmic with no gallops, rubs or murmurs, abdomen with distention and signs of ascites, positive lower extremity edema.   Na 136, K 4,4 Cl 103 bicarbonate 24 glucose 278 bun 13 cr 1,36 BNP 828  High sensitive troponin 9 and 10  Wbc 6.1 hgb 9,9 plt  209  Sars covid 19 negative   Chest radiograph with cardiomegaly, bilateral hilar vascular congestion with cephalization of the vasculature, and small bilateral pleural effusions. Sternotomy wires in place.  EKG 62 bpm, normal axis, right bundle branch block, sinus rhythm with no significant ST segment or T wave changes.   Abdominal US with no ascites.   Patient was placed on IV furosemide for diuresis.  Assessment and Plan: * Acute on chronic diastolic CHF (congestive heart failure) (HCC) Echocardiogram with preserved LV systolic function with EF 50 to 55%, mild LVH, RV systolic function with mild reduction, elevated LA pressure, RVSP 49,1, mild to moderate TR   Urine output is not documented but his edema is improving. Systolic blood pressure is 151 to 160 mmHg.   Plan to  continue diuresis with furosemide IV Continue with carvedilol and hydralazine.  Hold on lisinopril for now until renal function more stable.  Add SGLT 2 inh to augment diuresis.    Essential hypertension Continue blood pressure control with carvedilol Continue with IV furosemide Hold on lisinopril for now, if renal function stable, will transition to ARB.   Chronic kidney disease, stage 3a (HCC) Renal function with serum cr at 1,32 with K at 4,1 and serum bicarbonate at 23. Plan to continue diuresis with furosemide IV  Follow up renal function and electrolytes in am.   Type 2 diabetes mellitus with hyperlipidemia (HCC) Fasting glucose this am 154  Continue insulin sliding scale for glucose cover and monitoring  Continue with statin therapy.         Subjective: Patient is feeling better, with improved dyspnea and lower extremity edema but not back to baseline   Physical Exam: Vitals:   06/17/22 1100 06/17/22 1200 06/17/22 1254 06/17/22 1404  BP: (!) 152/72 (!) 161/76 (!) 160/82 (!) 151/79  Pulse: 60 62 65   Resp: (!) 23 (!) 23 18   Temp: 98 F (36.7 C)  98.1 F (36.7 C)   TempSrc: Oral  Oral   SpO2: 96% 98% 96%   Weight:      Height:       Neurology awake and alert ENT with mild pallor Cardiovascular with S1 and S2 present and rhythmic with no gallops, rubs or murmurs Respiratory with mild rales at bases, with no wheezing or rhonchi Abdomen with mild distention, tympanic to percussion Lower extremity  with edema ++ unna boots in place  Data Reviewed:    Family Communication: I spoke with patient's children at the bedside, we talked in detail about patient's condition, plan of care and prognosis and all questions were addressed.   Disposition: Status is: Inpatient Remains inpatient appropriate because: heart failure and IV diuresis   Planned Discharge Destination: Home    Author: Coralie Keens, MD 06/17/2022 3:12 PM  For on call review  [email or web address].

## 2022-06-17 NOTE — ED Notes (Signed)
Ortho tech notified of need for [employer].

## 2022-06-17 NOTE — Assessment & Plan Note (Signed)
Fasting glucose this am 154  Continue insulin sliding scale for glucose cover and monitoring  Continue with statin therapy.

## 2022-06-18 ENCOUNTER — Encounter (HOSPITAL_COMMUNITY): Payer: Self-pay | Admitting: Internal Medicine

## 2022-06-18 ENCOUNTER — Inpatient Hospital Stay (HOSPITAL_COMMUNITY): Payer: Medicare PPO

## 2022-06-18 DIAGNOSIS — R0602 Shortness of breath: Secondary | ICD-10-CM | POA: Diagnosis not present

## 2022-06-18 DIAGNOSIS — I10 Essential (primary) hypertension: Secondary | ICD-10-CM | POA: Diagnosis not present

## 2022-06-18 DIAGNOSIS — I5033 Acute on chronic diastolic (congestive) heart failure: Secondary | ICD-10-CM | POA: Diagnosis not present

## 2022-06-18 DIAGNOSIS — N1831 Chronic kidney disease, stage 3a: Secondary | ICD-10-CM | POA: Diagnosis not present

## 2022-06-18 DIAGNOSIS — E1169 Type 2 diabetes mellitus with other specified complication: Secondary | ICD-10-CM | POA: Diagnosis not present

## 2022-06-18 LAB — BASIC METABOLIC PANEL
Anion gap: 11 (ref 5–15)
BUN: 17 mg/dL (ref 8–23)
CO2: 28 mmol/L (ref 22–32)
Calcium: 9.1 mg/dL (ref 8.9–10.3)
Chloride: 99 mmol/L (ref 98–111)
Creatinine, Ser: 1.46 mg/dL — ABNORMAL HIGH (ref 0.61–1.24)
GFR, Estimated: 48 mL/min — ABNORMAL LOW (ref >60)
Glucose, Bld: 121 mg/dL — ABNORMAL HIGH (ref 70–99)
Potassium: 3.6 mmol/L (ref 3.5–5.1)
Sodium: 138 mmol/L (ref 135–145)

## 2022-06-18 LAB — ECHOCARDIOGRAM COMPLETE
AR max vel: 2.25 cm2
AV Area VTI: 2.05 cm2
AV Area mean vel: 1.97 cm2
AV Mean grad: 6 mmHg
AV Peak grad: 8.7 mmHg
Ao pk vel: 1.48 m/s
Area-P 1/2: 4.6 cm2
Calc EF: 41.4 %
Height: 70 in
S' Lateral: 4.2 cm
Single Plane A2C EF: 41.8 %
Single Plane A4C EF: 40 %
Weight: 2747.2 oz

## 2022-06-18 LAB — MAGNESIUM: Magnesium: 1.7 mg/dL (ref 1.7–2.4)

## 2022-06-18 LAB — GLUCOSE, CAPILLARY
Glucose-Capillary: 120 mg/dL — ABNORMAL HIGH (ref 70–99)
Glucose-Capillary: 233 mg/dL — ABNORMAL HIGH (ref 70–99)
Glucose-Capillary: 212 mg/dL — ABNORMAL HIGH (ref 70–99)
Glucose-Capillary: 229 mg/dL — ABNORMAL HIGH (ref 70–99)

## 2022-06-18 NOTE — Progress Notes (Signed)
   06/18/22 1500  Mobility  Activity Ambulated independently in hallway  Level of Assistance Independent  Assistive Device None  Distance Ambulated (ft) 300 ft  Activity Response Tolerated well  Mobility Referral Yes  $Mobility charge 1 Mobility   Mobility Specialist Progress Note  Received pt in chair having no complaints and agreeable to mobility. Pt was asymptomatic throughout ambulation and returned to room w/o fault. Left in chair w/ call bell in reach and all needs met.  Lucious Groves Mobility Specialist

## 2022-06-18 NOTE — Progress Notes (Signed)
Physical Therapy Treatment Patient Details Name: [name] Patella Sr. MRN: [id number] DOB: 1941-03-29 Today's Date: 06/18/2022   History of Present Illness 81 yo M  presented to the ED following PCP visit on 06/16/22 with shortness of breath, orthopnea, abdominal and LE edema. Found to have suspected left sided pleural effusion in office xray. CXR here with mild CHF and bibasilar atelectasis. with PMH of CAD s/p CABG, CHF, HTN, HLD, T2DM.    PT Comments    Pt with excellent progression for mobility. Today's session focused on ambulation and stair negotiation. Ambulated 210 ft without device, independent-supervision based on mild balance concerns. Negotiated 6 stairs with alternating pattern and intermittent use of L hand rail during ascent. All education has been completed and the patient has no further questions. See below for any follow-up Physical Therapy or equipment needs. Mobility staff will continue to follow pt until d/c from hospital.    Recommendations for follow up therapy are one component of a multi-disciplinary discharge planning process, led by the attending physician.  Recommendations may be updated based on patient status, additional functional criteria and insurance authorization.  Follow Up Recommendations  No PT follow up     Assistance Recommended at Discharge PRN  Patient can return home with the following Assist for transportation   Equipment Recommendations  None recommended by PT    Recommendations for Other Services       Precautions / Restrictions Precautions Precautions: Fall Restrictions Weight Bearing Restrictions: No     Mobility  Bed Mobility Overal bed mobility: Independent             General bed mobility comments: pt sitting up in recliner upon arrival    Transfers Overall transfer level: Independent Equipment used: None               General transfer comment: sit to stand from recliner without UE support     Ambulation/Gait Ambulation/Gait assistance: Supervision, Independent Gait Distance (Feet): 210 Feet Assistive device: None Gait Pattern/deviations: Step-through pattern, Decreased stride length, Narrow base of support, Scissoring Gait velocity: decreased     General Gait Details: narrow base of support with intermittent crossing of midline when distracted with conversation, however did recover from any foreseeable LOB without cuing required   Stairs Stairs: Yes Stairs assistance: Supervision Stair Management: One rail Left, Alternating pattern Number of Stairs: 6 General stair comments: alternating stair pattern with occassional use of L hand rail during ascent; pt continued to verbalized "my stairs are shorter and wider than this", supervision assist during stair negotiation for safety   Wheelchair Mobility    Modified Rankin (Stroke Patients Only)       Balance Overall balance assessment: Needs assistance Sitting-balance support: Feet supported Sitting balance-Leahy Scale: Good     Standing balance support: No upper extremity supported Standing balance-Leahy Scale: Fair Standing balance comment: prolonged static standing during conversation about home stairs and gown mgmt with no LOB                            Cognition Arousal/Alertness: Awake/alert Behavior During Therapy: WFL for tasks assessed/performed Overall Cognitive Status: Within Functional Limits for tasks assessed                                 General Comments: Multi tasking during session but did demonstrate NBOS during ambulation in hallway when communicating with therapy  staff        Exercises      General Comments General comments (skin integrity, edema, etc.): VSS on RA      Pertinent Vitals/Pain Pain Assessment Pain Assessment: No/denies pain    Home Living                          Prior Function            PT Goals (current goals can now be  found in the care plan section) Acute Rehab PT Goals Patient Stated Goal: to go home PT Goal Formulation: With patient/family Time For Goal Achievement: 07/01/22 Potential to Achieve Goals: Good Progress towards PT goals: Goals met/education completed, patient discharged from PT    Frequency    Min 3X/week      PT Plan Current plan remains appropriate    Co-evaluation              AM-PAC PT "6 Clicks" Mobility   Outcome Measure  Help needed turning from your back to your side while in a flat bed without using bedrails?: None Help needed moving from lying on your back to sitting on the side of a flat bed without using bedrails?: None Help needed moving to and from a bed to a chair (including a wheelchair)?: None Help needed standing up from a chair using your arms (e.g., wheelchair or bedside chair)?: None Help needed to walk in hospital room?: A Little Help needed climbing 3-5 steps with a railing? : A Little 6 Click Score: 22    End of Session Equipment Utilized During Treatment: Gait belt Activity Tolerance: Patient tolerated treatment well Patient left: with call bell/phone within reach;with family/visitor present Nurse Communication: Mobility status PT Visit Diagnosis: Muscle weakness (generalized) (M62.81)     Time: 2481-8590 PT Time Calculation (min) (ACUTE ONLY): 12 min  Charges:  $Gait Training: 8-22 mins                     Chipper Oman, SPT    [place] 06/18/2022, 9:38 AM

## 2022-06-18 NOTE — Progress Notes (Signed)
  Echocardiogram 2D Echocardiogram has been performed.  Janalyn Harder 06/18/2022, 11:51 AM

## 2022-06-18 NOTE — Progress Notes (Signed)
  Transition of Care [facility]) Screening Note   Patient Details  Name: [name]. Date of Birth: Feb 13, 1941   Transition of Care [facility]) CM/SW Contact:    Darrold Span, RN Phone Number: 06/18/2022, 4:16 PM    Transition of Care Department [facility]) has reviewed patient and no TOC needs have been identified at this time. We will continue to monitor patient advancement through interdisciplinary progression rounds. If new patient transition needs arise, please place a TOC consult.

## 2022-06-18 NOTE — Progress Notes (Incomplete)
Heart Failure Nurse Navigator Progress Note  PCP: Alfredia Ferguson, [name] PCP-Cardiologist: *** Admission Diagnosis: *** Admitted from: ***  Presentation:   [name] Alpha Sr. presented with ***  ECHO/ LVEF: ***  Clinical Course:  Past Medical History:  Diagnosis Date   Arthritis    Back pain    hx   CAD (coronary artery disease)    a. 2004 s/p CABG x 4 (LIMA->LAD, VG->Diag, VG->OM, VG->RCA); b. 2007 Cath: VG->Diag occluded, otw 3/4 patent grafts.   CKD (chronic kidney disease), stage II-III    Colon polyps    DM2 (diabetes mellitus, type 2) (HCC)    stable   GERD (gastroesophageal reflux disease)    Glaucoma    Gout    HLD (hyperlipidemia)    HTN (hypertension)    Neuropathy      Social History   Socioeconomic History   Marital status: Widowed    Spouse name: Not on file   Number of children: 3   Years of education: Not on file   Highest education level: 8th grade  Occupational History   Occupation: Retired  Tobacco Use   Smoking status: Never   Smokeless tobacco: Current    Types: Chew   Tobacco comments:    chew tobacco - 1 pack last 3 weeks  Vaping Use   Vaping Use: Never used  Substance and Sexual Activity   Alcohol use: No    Alcohol/week: 0.0 standard drinks of alcohol   Drug use: No   Sexual activity: Not Currently  Other Topics Concern   Not on file  Social History Narrative   Not on file   Social Determinants of Health   Financial Resource Strain: Low Risk  (06/18/2022)   Overall Financial Resource Strain (CARDIA)    Difficulty of Paying Living Expenses: Not hard at all  Food Insecurity: No Food Insecurity (06/17/2022)   Hunger Vital Sign    Worried About Running Out of Food in the Last Year: Never true    Ran Out of Food in the Last Year: Never true  Transportation Needs: No Transportation Needs (06/17/2022)   PRAPARE - Administrator, Civil Service (Medical): No    Lack of Transportation (Non-Medical): No   Physical Activity: Not on file  Stress: No Stress Concern Present (03/18/2018)   [employer] of Occupational Health - Occupational Stress Questionnaire    Feeling of Stress : Not at all  Social Connections: Unknown (06/23/2017)   Social Connection and Isolation Panel [NHANES]    Frequency of Communication with Friends and Family: Not on file    Frequency of Social Gatherings with Friends and Family: Not on file    Attends Religious Services: Not on file    Active Member of Clubs or Organizations: Not on file    Attends Banker Meetings: Not on file    Marital Status: Widowed    High Risk Criteria for Readmission and/or Poor Patient Outcomes: Heart failure hospital admissions (last 6 months): ***  No Show rate: *** Difficult social situation: *** Demonstrates medication adherence: *** Primary Language: *** Literacy level: ***  Barriers of Care:   ***  Considerations/Referrals:   Referral made to Heart Failure Pharmacist Stewardship: *** Referral made to Heart Failure CSW/NCM TOC: *** Referral made to Heart & Vascular TOC clinic: ***  Items for Follow-up on DC/TOC: ***   ***

## 2022-06-18 NOTE — Progress Notes (Signed)
PROGRESS NOTE    [name] [place] Sr.  EPP:[id number] DOB: 1940/10/27 DOA: 06/16/2022 PCP: Alfredia Ferguson, [name]  81/M  with history of CAD, diastolic CHF, pulmonary hypertension, T2DM, and chronic kidney disease 2, who presented with dyspnea, abdominal distention and lower extremity edema. Labs, w/ cr 1,36, BNP 828,  troponin 9 and 10  CXR with cardiomegaly, bilateral hilar vascular congestion with cephalization of the vasculature, and small bilateral pleural effusions. Abdominal US with no ascites.    Subjective: -Feels much better, breathing is improving  Assessment and Plan:  Acute on chronic diastolic CHF (congestive heart failure) (HCC) Echo with preserved LV systolic function with EF 50 to 55%, mild LVH, RV systolic function with mild reduction -continue IV lasix, jardiance, he is 3 L negative -Continue carvedilol and hydralazine -Follow-up repeat echocardiogram -US without ascites -hold lisinopril   Essential hypertension Continue blood pressure control with carvedilol Continue with IV furosemide   Chronic kidney disease, stage 3a (HCC) -stable, baseline creat 1.2   Type 2 diabetes mellitus  -stable, SSI   Normocytic anemia  -Monitor   DVT prophylaxis:lovenox Code Status: Full Code Family Communication: Son at bedside Disposition Plan:   Consultants:    Procedures:   Antimicrobials:    Objective: Vitals:   06/17/22 2000 06/18/22 0012 06/18/22 0419 06/18/22 0835  BP: (!) 140/74 (!) 142/61 (!) 140/68 (!) 158/61  Pulse: 68 67 71   Resp: 18 18 18    Temp: (!) 97.4 F (36.3 C) 97.6 F (36.4 C) 98.2 F (36.8 C)   TempSrc: Oral Oral Oral   SpO2: 95% 94% 90%   Weight:   77.9 kg   Height:        Intake/Output Summary (Last 24 hours) at 06/18/2022 1144 Last data filed at 06/18/2022 06/20/2022 Gross per 24 hour  Intake 1103 ml  Output 3400 ml  Net -2297 ml   Filed Weights   06/16/22 1548 06/18/22 0419  Weight: 76.7 kg 77.9 kg     Examination:  General exam: Elderly male sitting up in the chair, AAOx3, no distress HEENT: Positive JVD CVS: S1-S2, regular rhythm Lungs: Few basilar rales Abdomen: Soft, nontender, bowel sounds present Extremities: Unna boots on 1+ edema  Psychiatry:  Mood & affect appropriate.     Data Reviewed:   CBC: Recent Labs  Lab 06/16/22 0917 06/16/22 1608  WBC 6.4 6.1  NEUTROABS 4.1 3.7  HGB 10.5* 9.9*  HCT 31.8* 30.4*  MCV 88 92.1  PLT 226 209   Basic Metabolic Panel: Recent Labs  Lab 06/16/22 0917 06/16/22 1608 06/17/22 0500 06/18/22 0039  NA 138 136 139 138  K 5.3* 4.4 4.1 3.6  CL 103 103 105 99  CO2 21 24 23 28   GLUCOSE 200* 278* 154* 121*  BUN 13 13 16 17   CREATININE 1.31* 1.36* 1.32* 1.46*  CALCIUM 9.4 8.7* 8.9 9.1  MG  --   --   --  1.7   GFR: Estimated Creatinine Clearance: 41.7 mL/min (A) (by C-G formula based on SCr of 1.46 mg/dL (H)). Liver Function Tests: Recent Labs  Lab 06/16/22 1608  AST 27  ALT 52*  ALKPHOS 58  BILITOT 0.6  PROT 5.9*  ALBUMIN 3.5   No results for input(s): "LIPASE", "AMYLASE" in the last 168 hours. No results for input(s): "AMMONIA" in the last 168 hours. Coagulation Profile: No results for input(s): "INR", "PROTIME" in the last 168 hours. Cardiac Enzymes: No results for input(s): "CKTOTAL", "CKMB", "CKMBINDEX", "TROPONINI" in the last 168 hours.  BNP (last 3 results) No results for input(s): "PROBNP" in the last 8760 hours. HbA1C: Recent Labs    06/16/22 0917  HGBA1C 7.5*   CBG: Recent Labs  Lab 06/17/22 1211 06/17/22 1303 06/17/22 1551 06/17/22 2106 06/18/22 0605  GLUCAP 184* 170* 223* 143* 120*   Lipid Profile: No results for input(s): "CHOL", "HDL", "LDLCALC", "TRIG", "CHOLHDL", "LDLDIRECT" in the last 72 hours. Thyroid Function Tests: No results for input(s): "TSH", "T4TOTAL", "FREET4", "T3FREE", "THYROIDAB" in the last 72 hours. Anemia Panel: No results for input(s): "VITAMINB12", "FOLATE",  "FERRITIN", "TIBC", "IRON", "RETICCTPCT" in the last 72 hours. Urine analysis:    Component Value Date/Time   BILIRUBINUR negative 02/25/2022 0918   PROTEINUR Negative 02/25/2022 0918   UROBILINOGEN 0.2 02/25/2022 0918   NITRITE negative 02/25/2022 0918   LEUKOCYTESUR Negative 02/25/2022 0918   Sepsis Labs: @LABRCNTIP (procalcitonin:4,lacticidven:4)  ) Recent Results (from the past 240 hour(s))  Resp Panel by RT-PCR (Flu A&B, Covid) Anterior Nasal Swab     Status: None   Collection Time: 06/16/22  4:08 PM   Specimen: Anterior Nasal Swab  Result Value Ref Range Status   SARS Coronavirus 2 by RT PCR NEGATIVE NEGATIVE Final    Comment: (NOTE) SARS-CoV-2 target nucleic acids are NOT DETECTED.  The SARS-CoV-2 RNA is generally detectable in upper respiratory specimens during the acute phase of infection. The lowest concentration of SARS-CoV-2 viral copies this assay can detect is 138 copies/mL. A negative result does not preclude SARS-Cov-2 infection and should not be used as the sole basis for treatment or other patient management decisions. A negative result may occur with  improper specimen collection/handling, submission of specimen other than nasopharyngeal swab, presence of viral mutation(s) within the areas targeted by this assay, and inadequate number of viral copies(<138 copies/mL). A negative result must be combined with clinical observations, patient history, and epidemiological information. The expected result is Negative.  Fact Sheet for Patients:  06/18/22  Fact Sheet for Healthcare Providers:  BloggerCourse.com  This test is no t yet approved or cleared by the [employer].[place] FDA and  has been authorized for detection and/or diagnosis of SARS-CoV-2 by FDA under an Emergency Use Authorization (EUA). This EUA will remain  in effect (meaning this test can be used) for the duration of the COVID-19 declaration  under Section 564(b)(1) of the Act, 21 U.S.C.section 360bbb-3(b)(1), unless the authorization is terminated  or revoked sooner.       Influenza A by PCR NEGATIVE NEGATIVE Final   Influenza B by PCR NEGATIVE NEGATIVE Final    Comment: (NOTE) The Xpert Xpress SARS-CoV-2/FLU/RSV plus assay is intended as an aid in the diagnosis of influenza from Nasopharyngeal swab specimens and should not be used as a sole basis for treatment. Nasal washings and aspirates are unacceptable for Xpert Xpress SARS-CoV-2/FLU/RSV testing.  Fact Sheet for Patients: [place]  Fact Sheet for Healthcare Providers: BloggerCourse.com  This test is not yet approved or cleared by the [employer]ious[employer]ker.it FDA and has been authorized for detection and/or diagnosis of SARS-CoV-2 by FDA under an Emergency Use Authorization (EUA). This EUA will remain in effect (meaning this test can be used) for the duration of the COVID-19 declaration under Section 564(b)(1) of the Act, 21 U.S.C. section 360bbb-3(b)(1), unless the authorization is terminated or revoked.  Performed at [facility], [address], [place], [place]      Radiology Studies: [id number] ASCITES (ABDOMEN LIMITED)  Result Date: 06/16/2022 CLINICAL DATA:  CHF. EXAM: LIMITED ABDOMEN ULTRASOUND FOR  ASCITES TECHNIQUE: Limited ultrasound survey for ascites was performed in all four abdominal quadrants. COMPARISON:  None Available. FINDINGS: No ascites visualized in the 4 abdominal quadrants. There is a right pleural effusion. IMPRESSION: No ascites. Right pleural effusion. Electronically Signed   By: Darliss Cheney M.D.   On: 06/16/2022 22:19     Scheduled Meds:  allopurinol  300 mg Oral Daily   aspirin  81 mg Oral Daily   atorvastatin  20 mg Oral Daily   carvedilol  6.25 mg Oral BID   empagliflozin  10 mg Oral Daily   enoxaparin (LOVENOX) injection  40 mg Subcutaneous Q24H   ferrous sulfate   325 mg Oral Q breakfast   furosemide  40 mg Intravenous BID   gabapentin  300 mg Oral TID   hydrALAZINE  10 mg Oral Q8H   insulin aspart  0-15 Units Subcutaneous TID WC   insulin aspart  0-5 Units Subcutaneous QHS   ranolazine  1,000 mg Oral BID   sodium chloride flush  3 mL Intravenous Q12H   tamsulosin  0.4 mg Oral Daily   Continuous Infusions:  sodium chloride       LOS: 2 days    Time spent:  Zannie Cove, MD [facility]   06/18/2022, 11:44 AM

## 2022-06-18 NOTE — Evaluation (Signed)
Occupational Therapy Evaluation Patient Details Name: [name] Costlow Sr. MRN: [id number] DOB: Feb 01, 1941 Today's Date: 06/18/2022   History of Present Illness 81 yo M  presented to the ED following PCP visit on 06/16/22 with shortness of breath, orthopnea, abdominal and LE edema. Found to have suspected left sided pleural effusion in office xray. CXR here with mild CHF and bibasilar atelectasis. with PMH of CAD s/p CABG, CHF, HTN, HLD, T2DM.   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 Upon evaluation pt demonstrated indep ability to complete ADLs and mobility within the room without AD. Educated on energy conservation, pt verbalized understanding. He does not required further OT, will sign off. Thank you.      Recommendations for follow up therapy are one component of a multi-disciplinary discharge planning process, led by the attending physician.  Recommendations may be updated based on patient status, additional functional criteria and insurance authorization.   Follow Up Recommendations  No OT follow up     Assistance Recommended at Discharge PRN  Patient can return home with the following Other (comment) (assist with IADLs)    Functional Status Assessment  Patient has had a recent decline in their functional status and demonstrates the ability to make significant improvements in function in a reasonable and predictable amount of time.  Equipment Recommendations  None recommended by OT       Precautions / Restrictions Precautions Precautions: Fall Restrictions Weight Bearing Restrictions: No      Mobility Bed Mobility Overal bed mobility: Independent                  Transfers Overall transfer level: Independent              Balance Overall balance assessment: No apparent balance deficits (not formally assessed)               ADL either performed or  assessed with clinical judgement   ADL Overall ADL's : Independent           Vision Baseline Vision/History: 0 No visual deficits Vision Assessment?: No apparent visual deficits     Perception Perception Perception Tested?: No   Praxis Praxis Praxis tested?: Not tested    Pertinent Vitals/Pain Pain Assessment Pain Assessment: No/denies pain     Hand Dominance Right   Extremity/Trunk Assessment Upper Extremity Assessment Upper Extremity Assessment: Overall WFL for tasks assessed   Lower Extremity Assessment Lower Extremity Assessment: Overall WFL for tasks assessed   Cervical / Trunk Assessment Cervical / Trunk Assessment: Normal   Communication Communication Communication: HOH   Cognition Arousal/Alertness: Awake/alert Behavior During Therapy: WFL for tasks assessed/performed Overall Cognitive Status: Within Functional Limits for tasks assessed               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     General Comments  VSS on RA            Home Living Family/patient expects to be discharged to:: Private residence Living Arrangements: Alone Available Help at Discharge: Family;Available PRN/intermittently Type of Home: House Home Access: Stairs to enter Entrance Stairs-Number of Steps: 3 Entrance Stairs-Rails: None Home Layout: One level     Bathroom Shower/Tub: Producer, television/film/video: Standard     Home Equipment: Agricultural consultant (2 wheels);Cane - single point;Shower seat;Hand held shower head;Hospital bed  Prior Functioning/Environment Prior Level of Function : Independent/Modified Independent;Driving;Working/employed             Mobility Comments: No falls ADLs Comments: I PTA        OT Problem List: Cardiopulmonary status limiting activity         OT Goals(Current goals can be found in the care plan section) Acute Rehab OT Goals Patient Stated Goal: home OT Goal Formulation: All assessment and  education complete, DC therapy Time For Goal Achievement: 06/18/22 Potential to Achieve Goals: Good   AM-PAC OT "6 Clicks" Daily Activity     Outcome Measure Help from another person eating meals?: None Help from another person taking care of personal grooming?: None Help from another person toileting, which includes using toliet, bedpan, or urinal?: None Help from another person bathing (including washing, rinsing, drying)?: None Help from another person to put on and taking off regular upper body clothing?: None Help from another person to put on and taking off regular lower body clothing?: None 6 Click Score: 24   End of Session Nurse Communication: Mobility status  Activity Tolerance: Patient tolerated treatment well Patient left: in chair;with call bell/phone within reach  OT Visit Diagnosis: Muscle weakness (generalized) (M62.81)                Time: 2761-4709 OT Time Calculation (min): 15 min Charges:  OT General Charges $OT Visit: 1 Visit OT Evaluation $OT Eval Low Complexity: 1 Low   [name] 06/18/2022, 1:06 PM

## 2022-06-18 NOTE — Progress Notes (Signed)
   Heart Failure Stewardship Pharmacist Progress Note   PCP: Alfredia Ferguson, [name] PCP-Cardiologist: Julien Nordmann, MD    HPI:  81 yo M with PMH of CAD s/p CABG, CHF, HTN, HLD, T2DM.  Had CABG in 2004. Lexiscan myoview study in 07/2019 with potentially high risk study, nearly completely reversible defect involving the basal and mid inferior and inferoseptal myocardium consistent with ischemia and possibly and small area of scar. ECHO at that time with EF 50-55%. Repeat stress test 08/2021 with moderate risk. EF estimated at 43%. LHC later that month showed multivessel CAD, recommending medical management.  He presented to the ED following PCP visit on 11/20 with shortness of breath, orthopnea, abdominal and LE edema. Found to have suspected left sided pleural effusion in office xray. CXR here with mild CHF and bibasilar atelectasis. Last ECHO 02/19/22 showed LVEF 50-55%, no regional wall motion abnormalities, mild LVH, G3DD, RV mildly reduced. Unna boots placed.  Current HF Medications: Diuretic: furosemide 40 mg IV BID Beta Blocker: carvedilol 6.25 mg BID SGLT2i: Jardiance 10 mg daily  Prior to admission HF Medications: Diuretic: furosemide 40 mg AM and 20 mg PM Beta blocker: carvedilol 6.25 mg BID ACE/ARB/ARNI: lisinopril 20 mg daily *not taking [place] or Imdur (unsure of the reason why)  Pertinent Lab Values: Serum creatinine 1.46, BUN 17, Potassium 3.6, Sodium 138, BNP 828.8, Magnesium 1.7, A1c 7.5  Vital Signs: Weight: 171 lbs Blood pressure: 140/70s  Heart rate: 60-70s  I/O: -3.1L yesterday, net -3.1L  Medication Assistance / Insurance Benefits Check: Does the patient have prescription insurance?  Yes Type of insurance plan: [employer] Medicare + State Health Plan  Outpatient Pharmacy:  Prior to admission outpatient pharmacy: [facility] Is the patient willing to use [facility] TOC pharmacy at discharge? Yes Is the patient willing to transition their outpatient pharmacy to  utilize a [facility] outpatient pharmacy?   Pending    Assessment: 1. 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 NYHA class III symptoms. - Remains volume overloaded on exam. Continue furosemide 40 mg IV BID. Strict I/Os and daily weights. Replace electrolytes. Keep K>4 and Mag >2. May need to be switched to torsemide at discharge for better gut absorption.  - Continue carvedilol 6.25 mg BID - Lisinopril stopped until renal function more stable. Consider starting Entresto instead when able. Last dose of lisinopril 11/21 AM. - Consider adding spironolactone 25 mg daily to optimize GDMT - Continue Jardiance 10 mg daily   Plan: 1) Medication changes recommended at this time: - KCl 40 mEq x 1 - Magnesium 2g IV x 1  2) Patient assistance: - Has Medicare + State Health plan, can use monthly copay cards to lower cost - Entresto copay with card is $10 - Jardiance copay with card is $10  3)  Education  - Initial education completed - Full education to be completed prior to discharge  Sharen Hones, PharmD, BCPS Heart Failure Engineer, building services Phone [phone]

## 2022-06-19 DIAGNOSIS — I5033 Acute on chronic diastolic (congestive) heart failure: Secondary | ICD-10-CM | POA: Diagnosis not present

## 2022-06-19 LAB — BASIC METABOLIC PANEL
Anion gap: 12 (ref 5–15)
BUN: 21 mg/dL (ref 8–23)
CO2: 28 mmol/L (ref 22–32)
Calcium: 9 mg/dL (ref 8.9–10.3)
Chloride: 95 mmol/L — ABNORMAL LOW (ref 98–111)
Creatinine, Ser: 1.66 mg/dL — ABNORMAL HIGH (ref 0.61–1.24)
GFR, Estimated: 41 mL/min — ABNORMAL LOW (ref >60)
Glucose, Bld: 177 mg/dL — ABNORMAL HIGH (ref 70–99)
Potassium: 3.5 mmol/L (ref 3.5–5.1)
Sodium: 135 mmol/L (ref 135–145)

## 2022-06-19 LAB — CBC
HCT: 32.8 % — ABNORMAL LOW (ref 39.0–52.0)
Hemoglobin: 11 g/dL — ABNORMAL LOW (ref 13.0–17.0)
MCH: 29.5 pg (ref 26.0–34.0)
MCHC: 33.5 g/dL (ref 30.0–36.0)
MCV: 87.9 fL (ref 80.0–100.0)
Platelets: 262 10*3/uL (ref 150–400)
RBC: 3.73 MIL/uL — ABNORMAL LOW (ref 4.22–5.81)
RDW: 16.2 % — ABNORMAL HIGH (ref 11.5–15.5)
WBC: 7.1 10*3/uL (ref 4.0–10.5)
nRBC: 0 % (ref 0.0–0.2)

## 2022-06-19 LAB — GLUCOSE, CAPILLARY: Glucose-Capillary: 196 mg/dL — ABNORMAL HIGH (ref 70–99)

## 2022-06-19 MED ORDER — EMPAGLIFLOZIN 10 MG PO TABS
10.0000 mg | ORAL_TABLET | Freq: Every day | ORAL | 0 refills | Status: DC
  Filled 2022-06-19: qty 30, 30d supply, fill #0

## 2022-06-19 MED ORDER — FUROSEMIDE 20 MG PO TABS
20.0000 mg | ORAL_TABLET | Freq: Two times a day (BID) | ORAL | 0 refills | Status: DC
  Filled 2022-06-19: qty 90, 23d supply, fill #0

## 2022-06-19 MED ORDER — POTASSIUM CHLORIDE ER 20 MEQ PO TBCR: 20.0000 meq | EXTENDED_RELEASE_TABLET | Freq: Every day | ORAL | 0 refills | Status: DC

## 2022-06-19 MED ORDER — EMPAGLIFLOZIN 10 MG PO TABS: 10.0000 mg | ORAL_TABLET | Freq: Every day | ORAL | 0 refills | Status: DC

## 2022-06-19 MED ORDER — POTASSIUM CHLORIDE ER 20 MEQ PO TBCR
20.0000 meq | EXTENDED_RELEASE_TABLET | Freq: Every day | ORAL | 0 refills | Status: DC
  Filled 2022-06-19: qty 30, fill #0

## 2022-06-19 MED ORDER — FUROSEMIDE 20 MG PO TABS: 20.0000 mg | ORAL_TABLET | Freq: Two times a day (BID) | ORAL | 0 refills | Status: DC

## 2022-06-19 NOTE — Plan of Care (Signed)
  Problem: Education: Goal: Ability to describe self-care measures that may prevent or decrease complications (Diabetes Survival Skills Education) will improve Outcome: Progressing   Problem: Coping: Goal: Ability to adjust to condition or change in health will improve Outcome: Progressing   Problem: Fluid Volume: Goal: Ability to maintain a balanced intake and output will improve Outcome: Progressing   Problem: Activity: Goal: Risk for activity intolerance will decrease Outcome: Progressing   Problem: Safety: Goal: Ability to remain free from injury will improve Outcome: Progressing   Problem: Skin Integrity: Goal: Risk for impaired skin integrity will decrease Outcome: Progressing

## 2022-06-19 NOTE — Discharge Summary (Signed)
Physician Discharge Summary  [name] Sr. FFM:[id number] DOB: [birth year] DOA: 06/16/2022  PCP: [name], [name]  Admit date: 06/16/2022 Discharge date: 06/19/2022  Time spent: 35 minutes  Recommendations for Outpatient Follow-up:  CHF TOC clinic on 12/5 PCP in 1 week, please check BMP at follow-up Cardiology Dr. Rockey Situ in 1 month    Discharge Diagnoses:  Principal Problem:   Acute on chronic diastolic CHF (congestive heart failure) ([place])   CAD   Essential hypertension   Chronic kidney disease, stage 3a ([place])   Type 2 diabetes mellitus with hyperlipidemia [facility])   Discharge Condition: Improved  Diet recommendation: Low-sodium, heart healthy  Filed Weights   06/16/22 1548 06/18/22 0419 06/19/22 0556  Weight: 76.7 kg 77.9 kg 75.6 kg    History of present illness:  81/M  with history of CAD, diastolic CHF, pulmonary hypertension, T2DM, and chronic kidney disease 2, who presented with dyspnea, abdominal distention and lower extremity edema. Labs, w/ cr 1,36, BNP 828,  troponin 9 and 10  CXR with cardiomegaly, bilateral hilar vascular congestion with cephalization of the vasculature, and small bilateral pleural effusions. Abdominal US with no ascites.  Hospital Course:   Acute on chronic systolic and diastolic CHF Previously echo with a EF of 50%,, repeat echo this admission with EF of 45-50% global hypokinesis, grade 1 diastolic dysfunction and mildly reduced RV systolic function -Diuresed with IV Lasix with good clinical response, he is 8 L negative, Jardiance was resumed, NSAIDs discontinued, switched to oral Lasix 40 Mg in AM and 20 Mg in p.m. -Also continued on carvedilol and hydralazine -Creatinine slightly higher at than baseline at 1.6 at the time of discharge, recommend repeat labs in 1 week, CHF TOC follow-up arranged, consider starting Aldactone at follow-up if kidney function is stable   Essential hypertension -Stable, meds as  above  CAD/CABG -Continue aspirin, beta-blocker and statin   Chronic kidney disease, stage 3a (HCC) -stable, baseline creatinine around 1.3, creatinine was 1.6 at the time of discharge, needs repeat labs in 1 weeks   Type 2 diabetes mellitus  -stable, Jardiance and glipizide resumed   Normocytic anemia  -Stable, needs monitoring   Discharge Exam: Vitals:   06/19/22 0703 06/19/22 0729  BP: (!) 146/72 (!) 171/82  Pulse:  70  Resp:  18  Temp:  (!) 97.5 F (36.4 C)  SpO2:  95%   Gen: Awake, Alert, Oriented X 3,  HEENT: no JVD Lungs: Good air movement bilaterally, CTAB CVS: S1S2/RRR Abd: soft, Non tender, non distended, BS present Extremities: No edema Skin: no new rashes on exposed skin   Discharge Instructions   Discharge Instructions     Diet - low sodium heart healthy   Complete by: As directed    Diet Carb Modified   Complete by: As directed    Increase activity slowly   Complete by: As directed       Allergies as of 06/19/2022       Reactions   Ramipril         Medication List     STOP taking these medications    dapagliflozin propanediol 10 MG Tabs tablet Commonly known as: Farxiga Replaced by: empagliflozin 10 MG Tabs tablet   isosorbide mononitrate 120 MG 24 hr tablet Commonly known as: IMDUR   lisinopril 20 MG tablet Commonly known as: ZESTRIL   meloxicam 7.5 MG tablet Commonly known as: MOBIC       TAKE these medications    Accu-Chek Aviva Plus test strip  Generic drug: glucose blood USE AS DIRECTED TO CHECK FASTING BLOOD SUGAR EVERY MORNING   Accu-Chek Aviva Plus w/Device Kit To check blood sugar once daily   allopurinol 300 MG tablet Commonly known as: ZYLOPRIM TAKE 1 TABLET BY MOUTH ONCE DAILY   aspirin 81 MG chewable tablet Chew 81 mg by mouth daily.   atorvastatin 20 MG tablet Commonly known as: LIPITOR TAKE 1 TABLET BY MOUTH ONCE DAILY   BD [employer] Home Misc Dispose of syringes after B12 injections    carvedilol 6.25 MG tablet Commonly known as: COREG Take 1 tablet (6.25 mg total) by mouth 2 (two) times daily. Needs appointment for future refills  / 1st attempt   empagliflozin 10 MG Tabs tablet Commonly known as: Jardiance Take 1 tablet (10 mg total) by mouth daily. Replaces: dapagliflozin propanediol 10 MG Tabs tablet   furosemide 20 MG tablet Commonly known as: LASIX Take 1-2 tablets (20-40 mg total) by mouth 2 (two) times daily. Take 43m on morning and 256min evening What changed: See the new instructions.   gabapentin 400 MG capsule Commonly known as: NEURONTIN TAKE 1 CAPSULE BY MOUTH 3 TIMES A DAY   glipiZIDE 10 MG 24 hr tablet Commonly known as: GLUCOTROL XL Take 1 tablet (10 mg total) by mouth daily with breakfast.   Januvia 100 MG tablet Generic drug: sitaGLIPtin TAKE 1 TABLET BY MOUTH ONCE DAILY   metFORMIN 1000 MG tablet Commonly known as: GLUCOPHAGE TAKE 1 TABLET BY MOUTH TWICE A DAY WITH A MEAL What changed: See the new instructions.   nitroGLYCERIN 0.4 MG SL tablet Commonly known as: NITROSTAT Place 1 tablet (0.4 mg total) under the tongue every 5 (five) minutes as needed for chest pain.   Potassium Chloride ER 20 MEQ Tbcr Take 20 mEq by mouth daily. What changed:  medication strength how much to take   ranolazine 1000 MG SR tablet Commonly known as: RANEXA Take 1 tablet (1,000 mg total) by mouth 2 (two) times daily.   SYRINGE-NEEDLE (DISP) 3 ML 25G X 5/8" 3 ML Misc Commonly known as: B-D SYRINGE/NEEDLE 3CC/25GX5/8 For use with cyanocobalamin injections   tamsulosin 0.4 MG Caps capsule Commonly known as: FLOMAX Take 1 capsule (0.4 mg total) by mouth daily.       Allergies  Allergen Reactions   Ramipril     Follow-up Information     [facility]. Go in 14 day(s).   Specialty: Cardiology Why: Hospital follow up PLEASE bring a current medication list to appointment FREE valet parking, Entrance  C, off [place]: [address][phone]               The results of significant diagnostics from this hospitalization (including imaging, microbiology, ancillary and laboratory) are listed below for reference.    Significant Diagnostic Studies: ECHOCARDIOGRAM COMPLETE  Result Date: 06/18/2022    ECHOCARDIOGRAM REPORT   Patient Name:   [name] Doboszr. Date of Exam: 06/18/2022 Medical Rec #:  [id number]                 Height:       70.0 in Accession #:    [id number]                Weight:       171.7 lb Date of Birth:  10/1940-09-15  BSA:          1.956 m Patient Age:    33 years                    BP:           158/61 mmHg Patient Gender: M                           HR:           64 bpm. Exam Location:  Inpatient Procedure: 2D Echo, 3D Echo, Cardiac Doppler and Color Doppler Indications:    R06.02 SOB  History:        Patient has prior history of Echocardiogram examinations, most                 recent 02/19/2022. CHF, CAD, Abnormal ECG, Arrythmias:RBBB; Risk                 Factors:Dyslipidemia and Diabetes.  Sonographer:    Roseanna Rainbow RDCS Referring Phys: Reading  1. Left ventricular ejection fraction, by estimation, is 45 to 50%. The left ventricle has mildly decreased function. The left ventricle demonstrates global hypokinesis. The left ventricular internal cavity size was mildly dilated. Left ventricular diastolic parameters are consistent with Grade I diastolic dysfunction (impaired relaxation).  2. Right ventricular systolic function is mildly reduced. The right ventricular size is normal. There is normal pulmonary artery systolic pressure.  3. Left atrial size was mildly dilated.  4. The mitral valve is grossly normal. No evidence of mitral valve regurgitation. No evidence of mitral stenosis.  5. The aortic valve is tricuspid. There is moderate  calcification of the aortic valve. Aortic valve regurgitation is not visualized. Aortic valve sclerosis/calcification is present, without any evidence of aortic stenosis.  6. The inferior vena cava is dilated in size with >50% respiratory variability, suggesting right atrial pressure of 8 mmHg. Comparison(s): Prior images reviewed side by side. LV is slightly dilated from prior. FINDINGS  Left Ventricle: Left ventricular ejection fraction, by estimation, is 45 to 50%. The left ventricle has mildly decreased function. The left ventricle demonstrates global hypokinesis. 3D left ventricular ejection fraction analysis performed but not reported based on interpreter judgement due to suboptimal tracking. The left ventricular internal cavity size was mildly dilated. There is no left ventricular hypertrophy. Left ventricular diastolic parameters are consistent with Grade I diastolic dysfunction (impaired relaxation). Right Ventricle: The right ventricular size is normal. No increase in right ventricular wall thickness. Right ventricular systolic function is mildly reduced. There is normal pulmonary artery systolic pressure. The tricuspid regurgitant velocity is 2.64 m/s, and with an assumed right atrial pressure of 8 mmHg, the estimated right ventricular systolic pressure is 71.6 mmHg. Left Atrium: Left atrial size was mildly dilated. Right Atrium: Right atrial size was normal in size. Pericardium: There is no evidence of pericardial effusion. Mitral Valve: The mitral valve is grossly normal. No evidence of mitral valve regurgitation. No evidence of mitral valve stenosis. Tricuspid Valve: The tricuspid valve is normal in structure. Tricuspid valve regurgitation is mild . No evidence of tricuspid stenosis. Aortic Valve: The aortic valve is tricuspid. There is moderate calcification of the aortic valve. There is mild aortic valve annular calcification. Aortic valve regurgitation is not visualized. Aortic valve  sclerosis/calcification is present, without any  evidence of aortic stenosis. Aortic valve mean gradient measures 6.0 mmHg. Aortic valve peak gradient measures 8.7 mmHg. Aortic valve area,  by VTI measures 2.05 cm. Pulmonic Valve: The pulmonic valve was grossly normal. Pulmonic valve regurgitation is not visualized. No evidence of pulmonic stenosis. Aorta: The aortic root, ascending aorta and aortic arch are all structurally normal, with no evidence of dilitation or obstruction. Venous: The inferior vena cava is dilated in size with greater than 50% respiratory variability, suggesting right atrial pressure of 8 mmHg. IAS/Shunts: The interatrial septum appears to be lipomatous. No atrial level shunt detected by color flow Doppler.  LEFT VENTRICLE PLAX 2D LVIDd:         5.90 cm      Diastology LVIDs:         4.20 cm      LV e' medial:    8.38 cm/s LV PW:         1.10 cm      LV E/e' medial:  11.9 LV IVS:        0.90 cm      LV e' lateral:   9.14 cm/s LVOT diam:     2.40 cm      LV E/e' lateral: 10.9 LV SV:         66 LV SV Index:   34 LVOT Area:     4.52 cm                              3D Volume EF: LV Volumes (MOD)            3D EF:        42 % LV vol d, MOD A2C: 130.0 ml LV EDV:       210 ml LV vol d, MOD A4C: 143.0 ml LV ESV:       123 ml LV vol s, MOD A2C: 75.6 ml  LV SV:        87 ml LV vol s, MOD A4C: 85.8 ml LV SV MOD A2C:     54.4 ml LV SV MOD A4C:     143.0 ml LV SV MOD BP:      57.2 ml RIGHT VENTRICLE            IVC RV S prime:     7.83 cm/s  IVC diam: 2.60 cm TAPSE (M-mode): 1.3 cm LEFT ATRIUM             Index        RIGHT ATRIUM           Index LA diam:        4.00 cm 2.04 cm/m   RA Area:     19.80 cm LA Vol (A2C):   76.6 ml 39.16 ml/m  RA Volume:   55.90 ml  28.57 ml/m LA Vol (A4C):   37.1 ml 18.96 ml/m LA Biplane Vol: 51.4 ml 26.27 ml/m  AORTIC VALVE AV Area (Vmax):    2.25 cm AV Area (Vmean):   1.97 cm AV Area (VTI):     2.05 cm AV Vmax:           147.67 cm/s AV Vmean:          103.733 cm/s  AV VTI:            0.322 m AV Peak Grad:      8.7 mmHg AV Mean Grad:      6.0 mmHg LVOT Vmax:         73.40 cm/s LVOT Vmean:  45.100 cm/s LVOT VTI:          0.146 m LVOT/AV VTI ratio: 0.45  AORTA Ao Root diam: 3.50 cm Ao Asc diam:  3.50 cm MITRAL VALVE               TRICUSPID VALVE MV Area (PHT): 4.60 cm    TR Peak grad:   27.9 mmHg MV Decel Time: 165 msec    TR Vmax:        264.00 cm/s MV E velocity: 99.60 cm/s MV A velocity: 59.20 cm/s  SHUNTS MV E/A ratio:  1.68        Systemic VTI:  0.15 m                            Systemic Diam: 2.40 cm Rudean Haskell MD Electronically signed by Rudean Haskell MD Signature Date/Time: 06/18/2022/12:32:15 PM    Final    [place] ASCITES (ABDOMEN LIMITED)  Result Date: 06/16/2022 CLINICAL DATA:  CHF. EXAM: LIMITED ABDOMEN ULTRASOUND FOR ASCITES TECHNIQUE: Limited ultrasound survey for ascites was performed in all four abdominal quadrants. COMPARISON:  None Available. FINDINGS: No ascites visualized in the 4 abdominal quadrants. There is a right pleural effusion. IMPRESSION: No ascites. Right pleural effusion. Electronically Signed   By: Ronney Asters M.D.   On: 06/16/2022 22:19   DG Chest 2 View  Result Date: 06/16/2022 CLINICAL DATA:  History of CHF. Abnormal breath sounds and shortness of breath. EXAM: CHEST - 2 VIEW COMPARISON:  None recent FINDINGS: Previous median sternotomy and CABG procedure. Small bilateral pleural effusions and mild interstitial edema identified. Subsegmental atelectasis noted within the lung bases. No airspace consolidation. Spondylosis noted within the thoracic spine. IMPRESSION: 1. Mild congestive heart failure. 2. Bibasilar atelectasis. Electronically Signed   By: Kerby Moors M.D.   On: 06/16/2022 10:11    Microbiology: Recent Results (from the past 240 hour(s))  Resp Panel by RT-PCR (Flu A&B, Covid) Anterior Nasal Swab     Status: None   Collection Time: 06/16/22  4:08 PM   Specimen: Anterior Nasal Swab  Result Value  Ref Range Status   SARS Coronavirus 2 by RT PCR NEGATIVE NEGATIVE Final    Comment: (NOTE) SARS-CoV-2 target nucleic acids are NOT DETECTED.  The SARS-CoV-2 RNA is generally detectable in upper respiratory specimens during the acute phase of infection. The lowest concentration of SARS-CoV-2 viral copies this assay can detect is 138 copies/mL. A negative result does not preclude SARS-Cov-2 infection and should not be used as the sole basis for treatment or other patient management decisions. A negative result may occur with  improper specimen collection/handling, submission of specimen other than nasopharyngeal swab, presence of viral mutation(s) within the areas targeted by this assay, and inadequate number of viral copies(<138 copies/mL). A negative result must be combined with clinical observations, patient history, and epidemiological information. The expected result is Negative.  Fact Sheet for Patients:  EntrepreneurPulse.com.au  Fact Sheet for Healthcare Providers:  IncredibleEmployment.be  This test is no t yet approved or cleared by the [place] FDA and  has been authorized for detection and/or diagnosis of SARS-CoV-2 by FDA under an Emergency Use Authorization (EUA). This EUA will remain  in effect (meaning this test can be used) for the duration of the COVID-19 declaration under Section 564(b)(1) of the Act, 21 U.S.C.section 360bbb-3(b)(1), unless the authorization is terminated  or revoked sooner.       Influenza A by PCR NEGATIVE  NEGATIVE Final   Influenza B by PCR NEGATIVE NEGATIVE Final    Comment: (NOTE) The Xpert Xpress SARS-CoV-2/FLU/RSV plus assay is intended as an aid in the diagnosis of influenza from Nasopharyngeal swab specimens and should not be used as a sole basis for treatment. Nasal washings and aspirates are unacceptable for Xpert Xpress SARS-CoV-2/FLU/RSV testing.  Fact Sheet for  Patients: EntrepreneurPulse.com.au  Fact Sheet for Healthcare Providers: IncredibleEmployment.be  This test is not yet approved or cleared by the [place] FDA and has been authorized for detection and/or diagnosis of SARS-CoV-2 by FDA under an Emergency Use Authorization (EUA). This EUA will remain in effect (meaning this test can be used) for the duration of the COVID-19 declaration under Section 564(b)(1) of the Act, 21 U.S.C. section 360bbb-3(b)(1), unless the authorization is terminated or revoked.  Performed at [facility], [place] [address], [place], [place] [address]      Labs: Basic Metabolic Panel: Recent Labs  Lab 06/16/22 0917 06/16/22 1608 06/17/22 0500 06/18/22 0039 06/19/22 0026  NA 138 136 139 138 135  K 5.3* 4.4 4.1 3.6 3.5  CL 103 103 105 99 95*  CO2 _0 GLUCOSE 200* 278* 154* 121* 177*  BUN _1 CREATININE 1.31* 1.36* 1.32* 1.46* 1.66*  CALCIUM 9.4 8.7* 8.9 9.1 9.0  MG  --   --   --  1.7  --    Liver Function Tests: Recent Labs  Lab 06/16/22 1608  AST 27  ALT 52*  ALKPHOS 58  BILITOT 0.6  PROT 5.9*  ALBUMIN 3.5   No results for input(s): "LIPASE", "AMYLASE" in the last 168 hours. No results for input(s): "AMMONIA" in the last 168 hours. CBC: Recent Labs  Lab 06/16/22 0917 06/16/22 1608 06/19/22 0026  WBC 6.4 6.1 7.1  NEUTROABS 4.1 3.7  --   HGB 10.5* 9.9* 11.0*  HCT 31.8* 30.4* 32.8*  MCV 88 92.1 87.9  PLT 226 209 262   Cardiac Enzymes: No results for input(s): "CKTOTAL", "CKMB", "CKMBINDEX", "TROPONINI" in the last 168 hours. BNP: BNP (last 3 results) Recent Labs    02/17/22 1109 06/16/22 0917 06/16/22 1608  BNP 621.9* 543.2* 828.8*    ProBNP (last 3 results) No results for input(s): "PROBNP" in the last 8760 hours.  CBG: Recent Labs  Lab 06/18/22 0605 06/18/22 1159 06/18/22 1558 06/18/22 2058 06/19/22 0630  GLUCAP 120* 233* 212* 229* 196*        Signed:  Domenic Polite MD.  [facility] 06/19/2022, 11:01 AM

## 2022-06-20 ENCOUNTER — Other Ambulatory Visit (HOSPITAL_COMMUNITY): Payer: Self-pay

## 2022-06-23 ENCOUNTER — Telehealth: Payer: Self-pay | Admitting: *Deleted

## 2022-06-23 NOTE — Patient Outreach (Signed)
  Care Coordination TOC Note Transition Care Management Unsuccessful Follow-up Telephone Call  Date of discharge and from where:  [facility] 06/19/2022  Attempts:  1st Attempt  Reason for unsuccessful TCM follow-up call:  Unable to leave message Mailbox is full  Gean Maidens BSN RN [facility] [phone]

## 2022-06-24 ENCOUNTER — Telehealth: Payer: Self-pay | Admitting: *Deleted

## 2022-06-24 NOTE — Progress Notes (Unsigned)
I,Sha'taria Tyson,acting as a Education administrator for [employer], [name].,have documented all relevant documentation on the behalf of [name], [name],as directed by  [name], [name] while in the presence of [name], [name].   Established patient visit   Patient: [name] Vangieson Sr.   DOB: 05-Jan-1941   81 y.o. Male  MRN: [id number] Visit Date: 06/25/2022  Today's healthcare provider: [name], [name]   No chief complaint on file.  Subjective    HPI  Hypertension, follow-up  BP Readings from Last 3 Encounters:  06/19/22 (!) 171/82  06/16/22 (!) 191/94  05/16/22 (!) 165/71   Wt Readings from Last 3 Encounters:  06/19/22 166 lb 9.6 oz (75.6 kg)  06/16/22 169 lb (76.7 kg)  05/16/22 167 lb 3.2 oz (75.8 kg)     He was last seen for hypertension 1 weeks ago.  BP at that visit was 191/94. Management since that visit includes increasing lasix dose .  He reports {excellent/good/fair/poor:19665} compliance with treatment. He {is/is not:9024} having side effects. {document side effects if present:1}  Use of agents associated with hypertension: {bp agents assoc with hypertension:511::"none"}.   Outside blood pressures are {***enter patient reported home BP readings, or 'not being checked':1}. Symptoms: {Yes/No:20286} chest pain {Yes/No:20286} chest pressure  {Yes/No:20286} palpitations {Yes/No:20286} syncope  {Yes/No:20286} dyspnea {Yes/No:20286} orthopnea  {Yes/No:20286} paroxysmal nocturnal dyspnea {Yes/No:20286} lower extremity edema   Pertinent labs Lab Results  Component Value Date   CHOL 114 05/16/2021   HDL 35 (L) 05/16/2021   LDLCALC 59 05/16/2021   TRIG 104 05/16/2021   CHOLHDL 3.3 05/16/2021   Lab Results  Component Value Date   NA 135 06/19/2022   K 3.5 06/19/2022   CREATININE 1.66 (H) 06/19/2022   GFRNONAA 41 (L) 06/19/2022   GLUCOSE 177 (H) 06/19/2022   TSH 3.840 04/21/2018     The ASCVD Risk score (Arnett DK, et al., 2019) failed to  calculate for the following reasons:   The 2019 ASCVD risk score is only valid for ages 31 to 40  ---------------------------------------------------------------------------------------------------   Medications: Outpatient Medications Prior to Visit  Medication Sig   ACCU-CHEK AVIVA PLUS test strip USE AS DIRECTED TO CHECK FASTING BLOOD SUGAR EVERY MORNING   allopurinol (ZYLOPRIM) 300 MG tablet TAKE 1 TABLET BY MOUTH ONCE DAILY (Patient taking differently: Take 300 mg by mouth daily.)   aspirin 81 MG chewable tablet Chew 81 mg by mouth daily.   atorvastatin (LIPITOR) 20 MG tablet TAKE 1 TABLET BY MOUTH ONCE DAILY (Patient taking differently: Take 20 mg by mouth daily.)   BD SHARPS CONTAINER HOME MISC Dispose of syringes after B12 injections   Blood Glucose Monitoring Suppl (ACCU-CHEK AVIVA PLUS) w/Device KIT To check blood sugar once daily   carvedilol (COREG) 6.25 MG tablet Take 1 tablet (6.25 mg total) by mouth 2 (two) times daily. Needs appointment for future refills  / 1st attempt   empagliflozin (JARDIANCE) 10 MG TABS tablet Take 1 tablet (10 mg total) by mouth daily.   furosemide (LASIX) 20 MG tablet Take 1-2 tablets (20-40 mg total) by mouth 2 (two) times daily. Take 28m on morning and 271min evening   gabapentin (NEURONTIN) 400 MG capsule TAKE 1 CAPSULE BY MOUTH 3 TIMES A DAY (Patient taking differently: Take 400 mg by mouth 3 (three) times daily.)   glipiZIDE (GLUCOTROL XL) 10 MG 24 hr tablet Take 1 tablet (10 mg total) by mouth daily with breakfast.   JANUVIA 100 MG tablet TAKE 1 TABLET BY MOUTH  ONCE DAILY (Patient not taking: Reported on 06/16/2022)   metFORMIN (GLUCOPHAGE) 1000 MG tablet TAKE 1 TABLET BY MOUTH TWICE A DAY WITH A MEAL (Patient taking differently: Take 1,000 mg by mouth 2 (two) times daily with a meal.)   nitroGLYCERIN (NITROSTAT) 0.4 MG SL tablet Place 1 tablet (0.4 mg total) under the tongue every 5 (five) minutes as needed for chest pain.   Potassium Chloride ER  20 MEQ TBCR Take 20 mEq by mouth daily.   ranolazine (RANEXA) 1000 MG SR tablet Take 1 tablet (1,000 mg total) by mouth 2 (two) times daily. (Patient not taking: Reported on 06/16/2022)   SYRINGE-NEEDLE, DISP, 3 ML (B-D SYRINGE/NEEDLE 3CC/25GX5/8) 25G X 5/8" 3 ML MISC For use with cyanocobalamin injections   tamsulosin (FLOMAX) 0.4 MG CAPS capsule Take 1 capsule (0.4 mg total) by mouth daily.   Facility-Administered Medications Prior to Visit  Medication Dose Route Frequency Provider   sodium chloride flush (NS) 0.9 % injection 3 mL  3 mL Intravenous Q12H Furth, Cadence H, [name]    Review of Systems  {Labs  Heme  Chem  Endocrine  Serology  Results Review (optional):23779}   Objective    There were no vitals taken for this visit. {Show previous vital signs (optional):23777}  Physical Exam  ***  No results found for any visits on 06/25/22.  Assessment & Plan     ***  No follow-ups on file.      {provider attestation***:1}   [name], [name]  [facility] [phone] (phone) [phone] (fax)  [place]

## 2022-06-24 NOTE — Patient Outreach (Signed)
  Care Coordination [facility] Note Transition Care Management Unsuccessful Follow-up Telephone Call  Date of discharge and from where:  [facility] [id number]  Attempts:  2nd Attempt  Reason for unsuccessful TCM follow-up call:  Unable to leave message  Mailbox full  Gean Maidens BSN RN [facility] [phone]

## 2022-06-25 ENCOUNTER — Ambulatory Visit: Payer: Medicare PPO | Admitting: Physician Assistant

## 2022-06-25 ENCOUNTER — Telehealth: Payer: Self-pay | Admitting: *Deleted

## 2022-06-25 ENCOUNTER — Encounter: Payer: Self-pay | Admitting: Physician Assistant

## 2022-06-25 VITALS — BP 127/58 | HR 66 | Wt 168.0 lb

## 2022-06-25 DIAGNOSIS — I152 Hypertension secondary to endocrine disorders: Secondary | ICD-10-CM | POA: Diagnosis not present

## 2022-06-25 DIAGNOSIS — E118 Type 2 diabetes mellitus with unspecified complications: Secondary | ICD-10-CM

## 2022-06-25 DIAGNOSIS — N1831 Chronic kidney disease, stage 3a: Secondary | ICD-10-CM

## 2022-06-25 DIAGNOSIS — I5042 Chronic combined systolic (congestive) and diastolic (congestive) heart failure: Secondary | ICD-10-CM

## 2022-06-25 DIAGNOSIS — E1159 Type 2 diabetes mellitus with other circulatory complications: Secondary | ICD-10-CM | POA: Diagnosis not present

## 2022-06-25 NOTE — Assessment & Plan Note (Signed)
Appropriate control in office F/u with cardiology

## 2022-06-25 NOTE — Assessment & Plan Note (Signed)
Will repeat bmp d/t elevated creat in hospital

## 2022-06-25 NOTE — Patient Outreach (Signed)
  Care Coordination [facility] Note Transition Care Management Unsuccessful Follow-up Telephone Call  Date of discharge and from where:  [id number] [facility]  Attempts:  3rd Attempt  Reason for unsuccessful TCM follow-up call:  Voice mail full   Gean Maidens BSN RN [facility] [phone]

## 2022-06-25 NOTE — Assessment & Plan Note (Signed)
Euvolemic today. Pt has f/u with cardiology

## 2022-06-25 NOTE — Assessment & Plan Note (Addendum)
Continue medications, will f/u in three months for A1c check Continue to monitor for hypoglycemia Advised pt to d/c all soda intake

## 2022-06-26 ENCOUNTER — Other Ambulatory Visit: Payer: Self-pay | Admitting: Physician Assistant

## 2022-06-26 DIAGNOSIS — N179 Acute kidney failure, unspecified: Secondary | ICD-10-CM

## 2022-06-26 DIAGNOSIS — N189 Chronic kidney disease, unspecified: Secondary | ICD-10-CM

## 2022-06-26 LAB — BASIC METABOLIC PANEL
BUN/Creatinine Ratio: 17 (ref 10–24)
BUN: 35 mg/dL — ABNORMAL HIGH (ref 8–27)
CO2: 21 mmol/L (ref 20–29)
Calcium: 9.8 mg/dL (ref 8.6–10.2)
Chloride: 99 mmol/L (ref 96–106)
Creatinine, Ser: 2.1 mg/dL — ABNORMAL HIGH (ref 0.76–1.27)
Glucose: 135 mg/dL — ABNORMAL HIGH (ref 70–99)
Potassium: 5.6 mmol/L — ABNORMAL HIGH (ref 3.5–5.2)
Sodium: 137 mmol/L (ref 134–144)
eGFR: 31 mL/min/{1.73_m2} — ABNORMAL LOW (ref >59)

## 2022-07-02 ENCOUNTER — Emergency Department: Payer: Medicare PPO

## 2022-07-02 ENCOUNTER — Encounter (HOSPITAL_COMMUNITY): Payer: Self-pay

## 2022-07-02 ENCOUNTER — Other Ambulatory Visit: Payer: Self-pay

## 2022-07-02 ENCOUNTER — Observation Stay
Admission: EM | Admit: 2022-07-02 | Discharge: 2022-07-03 | Disposition: A | Discharge status: Home / Self Care | Payer: Medicare PPO | Attending: Family Medicine | Admitting: Family Medicine

## 2022-07-02 ENCOUNTER — Telehealth (HOSPITAL_COMMUNITY): Payer: Self-pay

## 2022-07-02 ENCOUNTER — Ambulatory Visit (HOSPITAL_COMMUNITY)
Admit: 2022-07-02 | Discharge: 2022-07-02 | Disposition: A | Discharge status: Home / Self Care | Payer: Medicare PPO | Attending: Cardiology | Admitting: Cardiology

## 2022-07-02 VITALS — BP 118/64 | HR 74 | Wt 166.4 lb

## 2022-07-02 DIAGNOSIS — I5022 Chronic systolic (congestive) heart failure: Secondary | ICD-10-CM

## 2022-07-02 DIAGNOSIS — I2510 Atherosclerotic heart disease of native coronary artery without angina pectoris: Secondary | ICD-10-CM | POA: Diagnosis not present

## 2022-07-02 DIAGNOSIS — E785 Hyperlipidemia, unspecified: Secondary | ICD-10-CM | POA: Insufficient documentation

## 2022-07-02 DIAGNOSIS — I5042 Chronic combined systolic (congestive) and diastolic (congestive) heart failure: Secondary | ICD-10-CM | POA: Diagnosis not present

## 2022-07-02 DIAGNOSIS — Z7982 Long term (current) use of aspirin: Secondary | ICD-10-CM | POA: Insufficient documentation

## 2022-07-02 DIAGNOSIS — E875 Hyperkalemia: Secondary | ICD-10-CM | POA: Diagnosis not present

## 2022-07-02 DIAGNOSIS — Z7984 Long term (current) use of oral hypoglycemic drugs: Secondary | ICD-10-CM | POA: Insufficient documentation

## 2022-07-02 DIAGNOSIS — Z951 Presence of aortocoronary bypass graft: Secondary | ICD-10-CM | POA: Insufficient documentation

## 2022-07-02 DIAGNOSIS — R42 Dizziness and giddiness: Secondary | ICD-10-CM | POA: Diagnosis not present

## 2022-07-02 DIAGNOSIS — Z955 Presence of coronary angioplasty implant and graft: Secondary | ICD-10-CM | POA: Insufficient documentation

## 2022-07-02 DIAGNOSIS — K219 Gastro-esophageal reflux disease without esophagitis: Secondary | ICD-10-CM | POA: Diagnosis present

## 2022-07-02 DIAGNOSIS — N179 Acute kidney failure, unspecified: Secondary | ICD-10-CM | POA: Diagnosis present

## 2022-07-02 DIAGNOSIS — E1140 Type 2 diabetes mellitus with diabetic neuropathy, unspecified: Secondary | ICD-10-CM | POA: Insufficient documentation

## 2022-07-02 DIAGNOSIS — I130 Hypertensive heart and chronic kidney disease with heart failure and stage 1 through stage 4 chronic kidney disease, or unspecified chronic kidney disease: Secondary | ICD-10-CM | POA: Insufficient documentation

## 2022-07-02 DIAGNOSIS — N1832 Chronic kidney disease, stage 3b: Secondary | ICD-10-CM | POA: Insufficient documentation

## 2022-07-02 DIAGNOSIS — E1122 Type 2 diabetes mellitus with diabetic chronic kidney disease: Secondary | ICD-10-CM | POA: Diagnosis not present

## 2022-07-02 DIAGNOSIS — N189 Chronic kidney disease, unspecified: Secondary | ICD-10-CM | POA: Diagnosis not present

## 2022-07-02 DIAGNOSIS — E1169 Type 2 diabetes mellitus with other specified complication: Secondary | ICD-10-CM | POA: Diagnosis present

## 2022-07-02 DIAGNOSIS — N1831 Chronic kidney disease, stage 3a: Secondary | ICD-10-CM | POA: Insufficient documentation

## 2022-07-02 DIAGNOSIS — I129 Hypertensive chronic kidney disease with stage 1 through stage 4 chronic kidney disease, or unspecified chronic kidney disease: Secondary | ICD-10-CM | POA: Diagnosis not present

## 2022-07-02 DIAGNOSIS — Z79899 Other long term (current) drug therapy: Secondary | ICD-10-CM | POA: Diagnosis not present

## 2022-07-02 LAB — POTASSIUM
Potassium: 6.8 mmol/L (ref 3.5–5.1)
Potassium: 5.6 mmol/L — ABNORMAL HIGH (ref 3.5–5.1)

## 2022-07-02 LAB — MAGNESIUM: Magnesium: 2.1 mg/dL (ref 1.7–2.4)

## 2022-07-02 LAB — CBC
HCT: 40 % (ref 39.0–52.0)
Hemoglobin: 13.1 g/dL (ref 13.0–17.0)
MCH: 29.8 pg (ref 26.0–34.0)
MCHC: 32.8 g/dL (ref 30.0–36.0)
MCV: 90.9 fL (ref 80.0–100.0)
Platelets: 302 10*3/uL (ref 150–400)
RBC: 4.4 MIL/uL (ref 4.22–5.81)
RDW: 14.7 % (ref 11.5–15.5)
WBC: 11 10*3/uL — ABNORMAL HIGH (ref 4.0–10.5)
nRBC: 0 % (ref 0.0–0.2)

## 2022-07-02 LAB — BASIC METABOLIC PANEL
Anion gap: 11 (ref 5–15)
Anion gap: 12 (ref 5–15)
BUN: 36 mg/dL — ABNORMAL HIGH (ref 8–23)
BUN: 41 mg/dL — ABNORMAL HIGH (ref 8–23)
CO2: 21 mmol/L — ABNORMAL LOW (ref 22–32)
CO2: 18 mmol/L — ABNORMAL LOW (ref 22–32)
Calcium: 9.5 mg/dL (ref 8.9–10.3)
Calcium: 9.5 mg/dL (ref 8.9–10.3)
Chloride: 98 mmol/L (ref 98–111)
Chloride: 101 mmol/L (ref 98–111)
Creatinine, Ser: 2.2 mg/dL — ABNORMAL HIGH (ref 0.61–1.24)
Creatinine, Ser: 2.12 mg/dL — ABNORMAL HIGH (ref 0.61–1.24)
GFR, Estimated: 30 mL/min — ABNORMAL LOW (ref >60)
GFR, Estimated: 31 mL/min — ABNORMAL LOW (ref >60)
Glucose, Bld: 163 mg/dL — ABNORMAL HIGH (ref 70–99)
Glucose, Bld: 163 mg/dL — ABNORMAL HIGH (ref 70–99)
Potassium: 6.5 mmol/L (ref 3.5–5.1)
Potassium: >7.5 mmol/L (ref 3.5–5.1)
Sodium: 130 mmol/L — ABNORMAL LOW (ref 135–145)
Sodium: 131 mmol/L — ABNORMAL LOW (ref 135–145)

## 2022-07-02 LAB — URINALYSIS, ROUTINE W REFLEX MICROSCOPIC
Bilirubin Urine: NEGATIVE
Glucose, UA: >=500 mg/dL — AB
Hgb urine dipstick: NEGATIVE
Ketones, ur: NEGATIVE mg/dL
Leukocytes,Ua: NEGATIVE
Nitrite: NEGATIVE
Protein, ur: NEGATIVE mg/dL
Specific Gravity, Urine: 1.01 (ref 1.005–1.030)
Squamous Epithelial / HPF: NONE SEEN (ref 0–5)
WBC, UA: NONE SEEN WBC/hpf (ref 0–5)
pH: 6 (ref 5.0–8.0)

## 2022-07-02 LAB — CBG MONITORING, ED: Glucose-Capillary: 160 mg/dL — ABNORMAL HIGH (ref 70–99)

## 2022-07-02 LAB — BRAIN NATRIURETIC PEPTIDE: B Natriuretic Peptide: 121.5 pg/mL — ABNORMAL HIGH (ref 0.0–100.0)

## 2022-07-02 LAB — CK: Total CK: 53 U/L (ref 49–397)

## 2022-07-02 MED ORDER — SODIUM ZIRCONIUM CYCLOSILICATE 10 G PO PACK
10.0000 g | PACK | Freq: Once | ORAL | Status: AC
  Administered 2022-07-02: 10 g via ORAL
  Filled 2022-07-02: qty 1

## 2022-07-02 MED ORDER — CARVEDILOL 6.25 MG PO TABS
3.1250 mg | ORAL_TABLET | Freq: Two times a day (BID) | ORAL | Status: DC
  Administered 2022-07-02 – 2022-07-03 (×2): 3.125 mg via ORAL
  Filled 2022-07-02 (×2): qty 1

## 2022-07-02 MED ORDER — SODIUM BICARBONATE 8.4 % IV SOLN
50.0000 meq | Freq: Once | INTRAVENOUS | Status: AC
  Administered 2022-07-02: 50 meq via INTRAVENOUS
  Filled 2022-07-02: qty 50

## 2022-07-02 MED ORDER — ALBUTEROL SULFATE (2.5 MG/3ML) 0.083% IN NEBU
2.5000 mg | INHALATION_SOLUTION | Freq: Once | RESPIRATORY_TRACT | Status: AC
  Administered 2022-07-02: 2.5 mg via RESPIRATORY_TRACT
  Filled 2022-07-02: qty 3

## 2022-07-02 MED ORDER — RANOLAZINE ER 500 MG PO TB12
1000.0000 mg | ORAL_TABLET | Freq: Two times a day (BID) | ORAL | Status: DC
  Administered 2022-07-02 – 2022-07-03 (×2): 1000 mg via ORAL
  Filled 2022-07-02 (×2): qty 2

## 2022-07-02 MED ORDER — STERILE WATER FOR INJECTION IV SOLN
INTRAVENOUS | Status: AC
  Filled 2022-07-02: qty 150

## 2022-07-02 MED ORDER — PANTOPRAZOLE SODIUM 40 MG PO TBEC
40.0000 mg | DELAYED_RELEASE_TABLET | Freq: Every day | ORAL | Status: DC
  Administered 2022-07-03: 40 mg via ORAL
  Filled 2022-07-02: qty 1

## 2022-07-02 MED ORDER — INSULIN ASPART 100 UNIT/ML IJ SOLN
0.0000 [IU] | Freq: Three times a day (TID) | INTRAMUSCULAR | Status: DC
  Administered 2022-07-02: 3 [IU] via SUBCUTANEOUS
  Administered 2022-07-03: 5 [IU] via SUBCUTANEOUS
  Filled 2022-07-02 (×2): qty 1

## 2022-07-02 MED ORDER — ALBUTEROL SULFATE (2.5 MG/3ML) 0.083% IN NEBU
5.0000 mg | INHALATION_SOLUTION | Freq: Once | RESPIRATORY_TRACT | Status: AC
  Administered 2022-07-02: 5 mg via RESPIRATORY_TRACT
  Filled 2022-07-02: qty 6

## 2022-07-02 MED ORDER — ALBUTEROL (5 MG/ML) CONTINUOUS INHALATION SOLN
5.0000 mg/h | INHALATION_SOLUTION | Freq: Once | RESPIRATORY_TRACT | Status: DC
  Filled 2022-07-02: qty 20

## 2022-07-02 MED ORDER — TAMSULOSIN HCL 0.4 MG PO CAPS
0.4000 mg | ORAL_CAPSULE | Freq: Every day | ORAL | Status: DC
  Administered 2022-07-03: 0.4 mg via ORAL
  Filled 2022-07-02: qty 1

## 2022-07-02 MED ORDER — CALCIUM GLUCONATE-NACL 1-0.675 GM/50ML-% IV SOLN
1.0000 g | Freq: Once | INTRAVENOUS | Status: AC
  Administered 2022-07-02: 1000 mg via INTRAVENOUS
  Filled 2022-07-02: qty 50

## 2022-07-02 MED ORDER — ATORVASTATIN CALCIUM 20 MG PO TABS
20.0000 mg | ORAL_TABLET | Freq: Every day | ORAL | Status: DC
  Administered 2022-07-03: 20 mg via ORAL
  Filled 2022-07-02: qty 1

## 2022-07-02 MED ORDER — SODIUM BICARBONATE 8.4 % IV SOLN
INTRAVENOUS | Status: DC
  Filled 2022-07-02: qty 1000

## 2022-07-02 MED ORDER — GABAPENTIN 300 MG PO CAPS: 400.0000 mg | ORAL_CAPSULE | Freq: Three times a day (TID) | ORAL | Status: DC

## 2022-07-02 MED ORDER — ENOXAPARIN SODIUM 30 MG/0.3ML IJ SOSY
30.0000 mg | PREFILLED_SYRINGE | INTRAMUSCULAR | Status: DC
  Administered 2022-07-02: 30 mg via SUBCUTANEOUS
  Filled 2022-07-02: qty 0.3

## 2022-07-02 MED ORDER — ONDANSETRON HCL 4 MG/2ML IJ SOLN: 4.0000 mg | Freq: Four times a day (QID) | INTRAMUSCULAR | Status: DC | PRN

## 2022-07-02 MED ORDER — ALLOPURINOL 300 MG PO TABS
300.0000 mg | ORAL_TABLET | Freq: Every day | ORAL | Status: DC
  Administered 2022-07-03: 300 mg via ORAL
  Filled 2022-07-02: qty 1

## 2022-07-02 MED ORDER — NITROGLYCERIN 0.4 MG SL SUBL: 0.4000 mg | SUBLINGUAL_TABLET | SUBLINGUAL | Status: DC | PRN

## 2022-07-02 MED ORDER — DEXTROSE 50 % IV SOLN
1.0000 | Freq: Once | INTRAVENOUS | Status: AC
  Administered 2022-07-02: 50 mL via INTRAVENOUS
  Filled 2022-07-02: qty 50

## 2022-07-02 MED ORDER — ASPIRIN 81 MG PO CHEW
81.0000 mg | CHEWABLE_TABLET | Freq: Every day | ORAL | Status: DC
  Administered 2022-07-03: 81 mg via ORAL
  Filled 2022-07-02: qty 1

## 2022-07-02 MED ORDER — ONDANSETRON HCL 4 MG PO TABS: 4.0000 mg | ORAL_TABLET | Freq: Four times a day (QID) | ORAL | Status: DC | PRN

## 2022-07-02 MED ORDER — INSULIN ASPART 100 UNIT/ML IJ SOLN
5.0000 [IU] | Freq: Once | INTRAMUSCULAR | Status: AC
  Administered 2022-07-02: 5 [IU] via INTRAVENOUS
  Filled 2022-07-02: qty 1

## 2022-07-02 NOTE — Assessment & Plan Note (Addendum)
Stable. Continue home regimen 

## 2022-07-02 NOTE — Assessment & Plan Note (Addendum)
Stable without exacerbation

## 2022-07-02 NOTE — Assessment & Plan Note (Deleted)
Continue PPI ?

## 2022-07-02 NOTE — ED Notes (Signed)
EDP at bedside  

## 2022-07-02 NOTE — ED Notes (Signed)
Sent med message for [employer] from pharmacy.

## 2022-07-02 NOTE — Progress Notes (Signed)
HEART & VASCULAR TRANSITION OF CARE CONSULT NOTE     Referring Physician: Dr. Broadus John  Primary Care: [name], [name]  Primary Cardiologist: [name], MD   HPI: Referred to clinic by Dr. Broadus John w/ internal medicine for heart failure consultation.   81 y/o male w/ h/o chronic diastolic heart failure, CAD s/p CABG in 2004, HTN, HLD, Type 2DM and CKD stage IIIb.   Had fairly recent [facility] 2/23 done for CP that showed CTO of the mRCA w/ bridging w/ L>R collaterals, patent LIMA-LAD and sequential SVG-OM-OM2 and chronically occluded SVG-D1. Unsuccessful attempted PCI to mid RCA due to inability to cross the lesion w/ guide wire. He was treated w/ medical therapy and referred to Dr. Irish Lack to discuss CTO PCI. Unfortunately, he was felt too high risk and continued medical therapy was recommended. EF at the time of last cath was normal.  Recently admitted 11/23 w/ acute CHF. Echo showed mildly reduced LVEF 45-50%, RV mildly reduced. Diuresed 8L w/ IV Lasix. Transitioned to PO Lasix. GDMT limited by baseline renal fx. Jardiance added to regimen. Continued on Coreg. D/c wt 166 lb. Referred to [facility] clinic.   He presents today for f/u. Here w/ daughter. Doing ok from volume status standpoint. Checking wt every morning and has been stable at home at 160 lb. No LEE. Denies orthopnea and PND. 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   He reports full med compliance. Has had issues w/ some dizziness and near syncope. BP today 118/64 c/w home baseline. Denies CP and palpitations.   ReDs 32%.   EKG NSR 79 bpm w/ RBBB.   Has scheduled f/u w/ gen cards in [place] next week.   Cardiac Testing  2D Echo 11/23  Left ventricular ejection fraction, by estimation, is 45 to 50%. The left ventricle has mildly decreased function. The left  ventricle demonstrates global hypokinesis. The left ventricular internal cavity size was mildly dilated. Left ventricular diastolic parameters are consistent with Grade I diastolic dysfunction (impaired relaxation). 1. Right ventricular systolic function is mildly reduced. The right ventricular size is normal. There is normal pulmonary artery systolic pressure. 2. 3. Left atrial size was mildly dilated. The mitral valve is grossly normal. No evidence of mitral valve regurgitation. No evidence of mitral stenosis. 4. The aortic valve is tricuspid. There is moderate calcification of the aortic valve. Aortic valve regurgitation is not visualized. Aortic valve sclerosis/calcification is present, without any evidence of aortic stenosis. 5. The inferior vena cava is dilated in size with >50% respiratory variability, suggesting right atrial pressure of 8 mmHg.   Review of Systems: [y] = yes, _0  = no   General: Weight gain _1 ; Weight loss _2 ; Anorexia _3 ; Fatigue [ Y]; Fever _4 ; Chills _5 ; Weakness [ Y]  Cardiac: Chest pain/pressure _6 ; Resting SOB _7 ; Exertional SOB [Y ]; Orthopnea _8 ; Pedal Edema _9 ; Palpitations _10 ; Syncope _11 ; Presyncope _12 ; Paroxysmal nocturnal dyspnea_13   Pulmonary: Cough _14 ; Wheezing_15 ; Hemoptysis_16 ; Sputum _17 ; Snoring _18   GI: Vomiting_19 ; Dysphagia_20 ; Melena_21 ; Hematochezia _22 ; Heartburn_23 ; Abdominal pain _24 ; Constipation _25 ; Diarrhea _26 ; BRBPR _27   GU: Hematuria_28 ; Dysuria _29 ; Nocturia_30   Vascular: Pain in legs with  walking _0 ; Pain in feet with lying flat _1 ; Non-healing sores _2 ; Stroke _3 ; TIA _4 ; Slurred speech _5 ;  Neuro: Headaches_6 ; Vertigo_7 ; Seizures_8 ; Paresthesias_9 ;Blurred vision _10 ; Diplopia _11 ; Vision changes _12   Ortho/Skin: Arthritis _13 ; Joint pain _14 ; Muscle pain _15 ; Joint swelling _16 ; Back Pain _17 ; Rash _18   Psych: Depression_19 ; Anxiety_20   Heme: Bleeding problems _21 ; Clotting disorders _22 ; Anemia _23    Endocrine: Diabetes _24 ; Thyroid dysfunction_25    Past Medical History:  Diagnosis Date   Arthritis    Back pain    hx   CAD (coronary artery disease)    a. 2004 s/p CABG x 4 (LIMA->LAD, VG->Diag, VG->OM, VG->RCA); b. 2007 Cath: VG->Diag occluded, otw 3/4 patent grafts.   CKD (chronic kidney disease), stage II-III    Colon polyps    DM2 (diabetes mellitus, type 2) (HCC)    stable   GERD (gastroesophageal reflux disease)    Glaucoma    Gout    HLD (hyperlipidemia)    HTN (hypertension)    Neuropathy     Current Outpatient Medications  Medication Sig Dispense Refill   ACCU-CHEK AVIVA PLUS test strip USE AS DIRECTED TO CHECK FASTING BLOOD SUGAR EVERY MORNING 100 each 4   allopurinol (ZYLOPRIM) 300 MG tablet TAKE 1 TABLET BY MOUTH ONCE DAILY (Patient taking differently: Take 300 mg by mouth daily.) 90 tablet 0   aspirin 81 MG chewable tablet Chew 81 mg by mouth daily.     atorvastatin (LIPITOR) 20 MG tablet TAKE 1 TABLET BY MOUTH ONCE DAILY (Patient taking differently: Take 20 mg by mouth daily.) 90 tablet 1   BD SHARPS [place] Dispose of syringes after B12 injections 1 each 5   Blood Glucose Monitoring Suppl (ACCU-CHEK AVIVA PLUS) w/Device KIT To check blood sugar once daily 1 kit 0   carvedilol (COREG) 6.25 MG tablet Take 1 tablet (6.25 mg total) by mouth 2 (two) times daily. Needs appointment for future refills  / 1st attempt 180 tablet 0   empagliflozin (JARDIANCE) 10 MG TABS tablet Take 1 tablet (10 mg total) by mouth daily. 30 tablet 0   furosemide (LASIX) 20 MG tablet Take 1-2 tablets (20-40 mg total) by mouth 2 (two) times daily. Take 9m on morning and 264min evening 120 tablet 0   gabapentin (NEURONTIN) 400 MG capsule TAKE 1 CAPSULE BY MOUTH 3 TIMES A DAY (Patient taking differently: Take 400 mg by mouth 3 (three) times daily.) 90 capsule 5   glipiZIDE (GLUCOTROL XL) 10 MG 24 hr tablet Take 1 tablet (10 mg total) by mouth daily with breakfast. 90 tablet 3    JANUVIA 100 MG tablet TAKE 1 TABLET BY MOUTH ONCE DAILY 90 tablet 1   metFORMIN (GLUCOPHAGE) 1000 MG tablet TAKE 1 TABLET BY MOUTH TWICE A DAY WITH A MEAL (Patient taking differently: Take 1,000 mg by mouth 2 (two) times daily with a meal.) 180 tablet 1   nitroGLYCERIN (NITROSTAT) 0.4 MG SL tablet Place 1 tablet (0.4 mg total) under the tongue every 5 (five) minutes as needed for chest pain. 25 tablet 3   Potassium Chloride ER 20 MEQ TBCR Take 20 mEq by mouth daily. 30 tablet 0   potassium chloride SA (KLOR-CON M) 20 MEQ tablet Take 20 mEq by mouth daily.     ranolazine (RANEXA) 1000 MG SR tablet Take 1 tablet (1,000 mg  total) by mouth 2 (two) times daily. 180 tablet 1   SYRINGE-NEEDLE, DISP, 3 ML (B-D SYRINGE/NEEDLE 3CC/25GX5/8) 25G X 5/8" 3 ML MISC For use with cyanocobalamin injections 50 each 0   tamsulosin (FLOMAX) 0.4 MG CAPS capsule Take 1 capsule (0.4 mg total) by mouth daily. 90 capsule 1   cetirizine (ZYRTEC) 10 MG tablet Take 10 mg by mouth daily. Take 1 tablet daily as needed for allergies     Current Facility-Administered Medications  Medication Dose Route Frequency Provider Last Rate Last Admin   sodium chloride flush (NS) 0.9 % injection 3 mL  3 mL Intravenous Q12H Furth, Cadence H, [name]        Allergies  Allergen Reactions   Ramipril       Social History   Socioeconomic History   Marital status: Widowed    Spouse name: Not on file   Number of children: 3   Years of education: Not on file   Highest education level: 8th grade  Occupational History   Occupation: Retired  Tobacco Use   Smoking status: Never   Smokeless tobacco: Current    Types: Chew   Tobacco comments:    chew tobacco - 1 pack last 3 weeks  Vaping Use   Vaping Use: Never used  Substance and Sexual Activity   Alcohol use: No    Alcohol/week: 0.0 standard drinks of alcohol   Drug use: No   Sexual activity: Not Currently  Other Topics Concern   Not on file  Social History Narrative   Not on  file   Social Determinants of Health   Financial Resource Strain: Low Risk  (06/18/2022)   Overall Financial Resource Strain (CARDIA)    Difficulty of Paying Living Expenses: Not hard at all  Food Insecurity: No Food Insecurity (06/17/2022)   Hunger Vital Sign    Worried About Running Out of Food in the Last Year: Never true    Ran Out of Food in the Last Year: Never true  Transportation Needs: No Transportation Needs (06/17/2022)   PRAPARE - Hydrologist (Medical): No    Lack of Transportation (Non-Medical): No  Physical Activity: Not on file  Stress: No Stress Concern Present (03/18/2018)   [place]    Feeling of Stress : Not at all  Social Connections: Unknown (06/23/2017)   Social Connection and Isolation Panel [NHANES]    Frequency of Communication with Friends and Family: Not on file    Frequency of Social Gatherings with Friends and Family: Not on file    Attends Religious Services: Not on file    Active Member of Clubs or Organizations: Not on file    Attends Archivist Meetings: Not on file    Marital Status: Widowed  Intimate Partner Violence: Not At Risk (06/17/2022)   Humiliation, Afraid, Rape, and Kick questionnaire    Fear of Current or Ex-Partner: No    Emotionally Abused: No    Physically Abused: No    Sexually Abused: No      Family History  Problem Relation Age of Onset   Stroke Mother     Vitals:   07/02/22 0848  BP: 118/64  Pulse: 74  SpO2: 98%  Weight: 75.5 kg (166 lb 6.4 oz)    PHYSICAL EXAM: ReDs 32%  General:  Well appearing. No respiratory difficulty HEENT: normal Neck: supple. no JVD. Carotids 2+ bilat; no bruits. No lymphadenopathy or thryomegaly appreciated.  Cor: PMI nondisplaced. Regular rate & rhythm. No rubs, gallops or murmurs. Lungs: clear Abdomen: soft, nontender, nondistended. No hepatosplenomegaly. No bruits or masses.  Good bowel sounds. Extremities: no cyanosis, clubbing, rash, edema Neuro: alert & oriented x 3, cranial nerves grossly intact. moves all 4 extremities w/o difficulty. Affect pleasant.  ECG: NSR w/ RBBB 79 bpm    ASSESSMENT & PLAN:  1. Chronic Systolic Heart Failure - Echo 7/23 EF 55-55%, RV mildly reduced - Echo 11/23 w/ mildly reduced LVEF, 45-50%, RV mildly reduced - this may be ischemically mediated given known CAD as outlined above - NYHA Class II-early III. Euvolemic on exam. Wt stable since discharge. ReDs 32%.  - Continue Lasix 40 mg qam and 20 mg qpm - continue medical management. GDMT limited by orthostasis and CKDIIlb - continue Jardiance 10 mg daily  - continue Coreg 6.25 mg bid - no ARB/ARNi/ MRA w/ CKD - BP currently too soft for Bidil  - discussed continuation of daily wts - further management per gen cards   2. CAD - outlined above, treating medically and followed by gen cards - stable w/o CP - cont ASA/statin/? blocker and Ranexa  - if recurrent angina, consider addition of LA nitrate given concomitant systolic dysfunction and CKDIII limiting GDMT   3. HTN - controlled on current regimen - SBP 110s>>given age, CKD III and orthostasis, will not plan aggressive GDMT titration today   4. Stage IIIb CKD - check BMP today   5. HLD - on atorva 20  - LP followed by cardiology   NYHA II-early III GDMT  Diuretic- Lasix 40/20  BB- Coreg 6.25 mg bid  Ace/ARB/ARNI No, CKD IIIb MRA No CKD IIIb  SGLT2i Jardiance 10 mg     Referred to HFSW (PCP, Medications, Transportation, ETOH Abuse, Drug Abuse, Insurance, Museum/gallery curator ): No  Refer to Pharmacy: No Refer to Home Health:  No Refer to Advanced Heart Failure Clinic: No  Refer to General Cardiology: Yes   Follow up w/ Gen cards. Has f/u in [place] next week.

## 2022-07-02 NOTE — ED Triage Notes (Signed)
ARrives for ED evaluation for elevated potassium.  K:  6.5 for 0930.  Patient has been c/o dizziness since Thanksgiving.  Discharged from Hospital recently.  Patient reports multiple falls since Thanksgiving as well.  AAOx3.  Skin warm and dry. NAD  skin Pale, warm and dry.

## 2022-07-02 NOTE — ED Notes (Signed)
Pt denies chest pain. Sent from PCP for high potassium (over 6). IV inserted.

## 2022-07-02 NOTE — Telephone Encounter (Signed)
-----   Message from Allayne Butcher, [place] sent at 07/02/2022  1:29 PM EST ----- K elevated at 6.5. Instruct to go to [facility] for STAT repeat BMP and treatment w/ Lokelma if repeat elevated. Stop KCl supp.

## 2022-07-02 NOTE — H&P (Signed)
History and Physical    Patient: [name] Mauriello Sr. TKZ:[id number] DOB: 1941/07/28 DOA: 07/02/2022 DOS: the patient was seen and examined on 07/02/2022 PCP: [name], [name]  Patient coming from: Home  Chief Complaint:  Chief Complaint  Patient presents with   abnormal bloodwork   HPI: [name] Mittman Sr. is a 81 y.o. male with medical history significant for coronary artery disease, chronic diastolic dysfunction CHF, pulmonary hypertension, diabetes mellitus with complications of stage IIIa chronic kidney disease who was recently discharged from the hospital following an admission for acute on chronic combined CHF. Patient was discharged on Lasix 40 mg in the morning and 20 mg in the evening as well as potassium 20 mEq daily. He has done well since his discharge home and his weight has been stable.  He complains of episodes of dizziness and lightheadedness mostly in the evenings and has had several falls since his discharge without any loss of consciousness or head trauma.  He went to see his cardiologist today and had blood work done and was called and asked to go to the ER because his potassium was elevated. Labs show a potassium GREATER than 7 with EKG changes consisting of wide complex QRS. He also had worsening of his renal function from baseline 1.60 >> 2.12 with a normal anion gap metabolic acidosis. He denies having any chest pain, no shortness of breath, no leg swelling, no orthopnea, no fever, no chills, no cough, no headache, no abdominal pain, no urinary symptoms, no changes in his bowel habits, no blurred vision, no focal deficit.     Review of Systems: As mentioned in the history of present illness. All other systems reviewed and are negative. Past Medical History:  Diagnosis Date   Arthritis    Back pain    hx   CAD (coronary artery disease)    a. 2004 s/p CABG x 4 (LIMA->LAD, VG->Diag, VG->OM, VG->RCA); b. 2007 Cath: VG->Diag occluded, otw 3/4  patent grafts.   CKD (chronic kidney disease), stage II-III    Colon polyps    DM2 (diabetes mellitus, type 2) ([place])    stable   GERD (gastroesophageal reflux disease)    Glaucoma    Gout    HLD (hyperlipidemia)    HTN (hypertension)    Neuropathy    Past Surgical History:  Procedure Laterality Date   BACK SURGERY     Cyst removed   CORONARY ARTERY BYPASS GRAFT  2004   CORONARY STENT INTERVENTION N/A 09/24/2021   Procedure: CORONARY STENT INTERVENTION;  Surgeon: [name], MD;  Location: [facility];  Service: Cardiovascular;  Laterality: N/A;   KNEE SURGERY     LEFT HEART CATH AND CORONARY ANGIOGRAPHY Left 09/24/2021   Procedure: LEFT HEART CATH AND CORONARY ANGIOGRAPHY;  Surgeon: [name], MD;  Location: [facility];  Service: Cardiovascular;  Laterality: Left;   Social History:  reports that he has never smoked. His smokeless tobacco use includes chew. He reports that he does not drink alcohol and does not use drugs.  Allergies  Allergen Reactions   Ramipril     Family History  Problem Relation Age of Onset   Stroke Mother     Prior to Admission medications   Medication Sig Start Date End Date Taking? Authorizing Provider  ACCU-CHEK AVIVA PLUS test strip USE AS DIRECTED TO CHECK FASTING BLOOD SUGAR EVERY MORNING 10/15/21   Drubel, Ria Comment, [name]  allopurinol (ZYLOPRIM) 300 MG tablet TAKE 1 TABLET BY MOUTH ONCE  DAILY Patient taking differently: Take 300 mg by mouth daily. 04/29/22   [name], [name]  aspirin 81 MG chewable tablet Chew 81 mg by mouth daily.    [provider]  atorvastatin (LIPITOR) 20 MG tablet TAKE 1 TABLET BY MOUTH ONCE DAILY Patient taking differently: Take 20 mg by mouth daily. 04/17/22   Jettie Booze, MD  BD SHARPS [facility] MISC Dispose of syringes after B12 injections 10/19/17   Mar Daring, [name]  Blood Glucose Monitoring Suppl (ACCU-CHEK AVIVA PLUS) w/Device KIT To check blood sugar  once daily 06/11/17   Fenton Malling M, [name]  carvedilol (COREG) 6.25 MG tablet Take 1 tablet (6.25 mg total) by mouth 2 (two) times daily. Needs appointment for future refills  / 1st attempt 04/29/22   Minna Merritts, MD  cetirizine (ZYRTEC) 10 MG tablet Take 10 mg by mouth daily. Take 1 tablet daily as needed for allergies    [provider]  empagliflozin (JARDIANCE) 10 MG TABS tablet Take 1 tablet (10 mg total) by mouth daily. 06/19/22   Domenic Polite, MD  furosemide (LASIX) 20 MG tablet Take 1-2 tablets (20-40 mg total) by mouth 2 (two) times daily. Take 39m on morning and 212min evening 06/19/22   JoDomenic PoliteMD  gabapentin (NEURONTIN) 400 MG capsule TAKE 1 CAPSULE BY MOUTH 3 TIMES A DAY Patient taking differently: Take 400 mg by mouth 3 (three) times daily. 09/19/21   DrMikey KirschnerPA-C  glipiZIDE (GLUCOTROL XL) 10 MG 24 hr tablet Take 1 tablet (10 mg total) by mouth daily with breakfast. 08/26/21   DrMikey KirschnerPA-C  JANUVIA 100 MG tablet TAKE 1 TABLET BY MOUTH ONCE DAILY 06/16/22   Drubel, LiRia CommentPA-C  metFORMIN (GLUCOPHAGE) 1000 MG tablet TAKE 1 TABLET BY MOUTH TWICE A DAY WITH A MEAL Patient taking differently: Take 1,000 mg by mouth 2 (two) times daily with a meal. 05/23/22   Drubel, LiRia CommentPA-C  nitroGLYCERIN (NITROSTAT) 0.4 MG SL tablet Place 1 tablet (0.4 mg total) under the tongue every 5 (five) minutes as needed for chest pain. 02/17/22   Furth, Cadence H, [name]  ranolazine (RANEXA) 1000 MG SR tablet Take 1 tablet (1,000 mg total) by mouth 2 (two) times daily. 02/17/22   Furth, Cadence H, [name]  SYRINGE-NEEDLE, DISP, 3 ML (B-D SYRINGE/NEEDLE 3CC/25GX5/8) 25G X 5/8" 3 ML MISC For use with cyanocobalamin injections 10/19/17   BuMar DaringPA-C  tamsulosin (FLOMAX) 0.4 MG CAPS capsule Take 1 capsule (0.4 mg total) by mouth daily. 02/25/22   DrMikey KirschnerPA-C    Physical Exam: Vitals:   07/02/22 1530 07/02/22 1600 07/02/22 1630 07/02/22 1645   BP: 116/65 124/62 118/62   Pulse: 76 86 72 71  Resp: _0 Temp:      TempSrc:      SpO2: 97% 100% 97% 96%  Weight:      Height:       Physical Exam Vitals and nursing note reviewed.  Constitutional:      Appearance: He is obese.     Comments: Looks younger than stated age  HENT:     Head: Normocephalic and atraumatic.     Nose: Nose normal.     Mouth/Throat:     Mouth: Mucous membranes are moist.  Eyes:     Conjunctiva/sclera: Conjunctivae normal.  Cardiovascular:     Rate and Rhythm: Normal rate and regular rhythm.  Pulmonary:     Effort: Pulmonary effort is  normal.     Breath sounds: Normal breath sounds.  Abdominal:     General: Abdomen is flat. Bowel sounds are normal.     Palpations: Abdomen is soft.  Musculoskeletal:        General: Normal range of motion.     Cervical back: Normal range of motion and neck supple.  Skin:    General: Skin is warm and dry.  Neurological:     General: No focal deficit present.     Mental Status: He is alert.  Psychiatric:        Mood and Affect: Mood normal.        Behavior: Behavior normal.     Data Reviewed: Relevant notes from primary care and specialist visits, past discharge summaries as available in EHR, including Care Everywhere. Prior diagnostic testing as pertinent to current admission diagnoses Updated medications and problem lists for reconciliation ED course, including vitals, labs, imaging, treatment and response to treatment Triage notes, nursing and pharmacy notes and ED provider's notes Notable results as noted in HPI Labs reviewed.  Sodium 131, potassium greater than 7.5, chloride 101, bicarb 18, glucose 163, BUN 41, creatinine 2.12, calcium 9.5, white count 11.0, hemoglobin 13.1, hematocrit 40, platelet count 302, BMP 121 Chest x-ray reviewed by me showed cardiomegaly with clear lungs There are no new results to review at this time.  Assessment and Plan: * Hyperkalemia Most likely iatrogenic from  oversupplementation of potassium Patient noted to have a potassium level of greater than 7.5 with an anion gap metabolic acidosis and EKG changes Patient received IV calcium gluconate, sodium bicarbonate, dextrose, insulin and Lokelma. We will start patient on a bicarbonate infusion Check serial potassium levels We will consult nephrology  AKI (acute kidney injury) ([place]) Most likely secondary to over diuresis And has a baseline serum creatinine of 1.6 and today on admission it is 2.10 Hold furosemide Judicious IV fluid hydration Repeat renal panel in 10  CKD stage 3 due to type 2 diabetes mellitus ([place]) Patient presents with type 2 diabetes mellitus with complications of stage IIIa chronic kidney disease Hold oral hypoglycemic agents Maintain consistent carbohydrate diet Glycemic control with sliding scale insulin  Chronic combined systolic and diastolic CHF (congestive heart failure) (HCC) Stable and not acutely exacerbated Last known LVEF of 45 to 50% Hold furosemide due to worsening renal function Continue carvedilol but decrease dose to 3.125 mg twice daily due to episodes of dizziness/falls concerning for orthostatic hypotension Maintain low-sodium diet  CAD (coronary artery disease) Stable Continue Ranexa, aspirin, atorvastatin and carvedilol  GERD Continue PPI      Advance Care Planning:   Code Status: Full Code  Consults: Nephrology  Family Communication: Greater than 50% of time was spent discussing patient's condition and plan of care with him and his daughter at the bedside.  All questions and concerns have been addressed.  They verbalized understanding and agree with the plan.  Severity of Illness: The appropriate patient status for this patient is OBSERVATION. Observation status is judged to be reasonable and necessary in order to provide the required intensity of service to ensure the patient's safety. The patient's presenting symptoms, physical exam findings,  and initial radiographic and laboratory data in the context of their medical condition is felt to place them at decreased risk for further clinical deterioration. Furthermore, it is anticipated that the patient will be medically stable for discharge from the hospital within 2 midnights of admission.   Author: Collier Bullock, MD 07/02/2022 5:00 PM  For on call review [email or web address].

## 2022-07-02 NOTE — Assessment & Plan Note (Addendum)
Baseline creatinine of about 1.6. 

## 2022-07-02 NOTE — Telephone Encounter (Signed)
Heart Failure Nurse Navigator Progress Note  Attempted to call to remind of HV TOC reminder. No answer, no opportunity to leave voice mail.   Ozella Rocks, MSN, RN Heart Failure Nurse Navigator

## 2022-07-02 NOTE — ED Notes (Signed)
Date and time results received: 07/02/22 1805  Test: potassium Critical Value: 6.8  Name of Provider Notified: Agbata, MD  Awaiting orders

## 2022-07-02 NOTE — ED Notes (Signed)
Date and time results received: 07/02/22 1531 (use smartphrase ".now" to insert current time)  Test: potassium  Critical Value: greater than 7.5  Name of Provider Notified: Shaune Pollack, MD

## 2022-07-02 NOTE — Assessment & Plan Note (Addendum)
Likely secondary to overdose. Patient states he was taking two pills per day, although was prescribed one per day. Patient received calcium gluconate, sodium bicarbonate, dextrose, insulin and Lokelma for an initial potassium of 6.5 with peak potassium >7.5 with associated EKG changes. Potassium with steady decline to 4.6-5 with stabilization. Nephrology was consulted and agreed with discharge and to discontinue potassium; no concern for CKD contributing to hyperkalemia.

## 2022-07-02 NOTE — Telephone Encounter (Signed)
Patients son justin advised and verbalized understanding. Will take patient to ER, med list updated to reflect changes.

## 2022-07-02 NOTE — ED Notes (Signed)
Lab called and they didn't have enough blood to run the CK. When we send the light green tube they will run all 3 outstanding tests together.

## 2022-07-02 NOTE — Progress Notes (Signed)
ReDS Vest / Clip - 07/02/22 0900       ReDS Vest / Clip   Station Marker C    Ruler Value 32    ReDS Value Range Low volume    ReDS Actual Value 33

## 2022-07-02 NOTE — ED Notes (Signed)
Called lab, need green top for potassium.

## 2022-07-02 NOTE — ED Provider Notes (Signed)
[facility] Provider Note    Event Date/Time   First MD Initiated Contact with Patient 07/02/22 1517     (approximate)   History   abnormal bloodwork   HPI  [name] Matsuo Sr. is a 81 y.o. male  with PMhx HFpEF, CAD, HTN, HLD, CKD here with hyperkalemia. Pt has had worsening fatigue over past few weeks. He was hospitalized with CHF and said he had his lasix adjusted and potassium supplements incrased. Since then his leg swelling has improved but he has had worsening general fatigue, weakness. He went to a PCP yesterday and had labs which showed marked hyperkalemia so he presents for evaluation. No other med changes. He continues to urinate normally.      Physical Exam   Triage Vital Signs: ED Triage Vitals  Enc Vitals Group     BP 07/02/22 1451 (!) 132/58     Pulse Rate 07/02/22 1451 84     Resp 07/02/22 1451 16     Temp 07/02/22 1451 97.6 F (36.4 C)     Temp Source 07/02/22 1451 Oral     SpO2 07/02/22 1451 100 %     Weight 07/02/22 1448 166 lb 3.6 oz (75.4 kg)     Height 07/02/22 1448 5\' 10"  (1.778 m)     Head Circumference --      Peak Flow --      Pain Score 07/02/22 1448 0     Pain Loc --      Pain Edu? --      Excl. in GC? --     Most recent vital signs: Vitals:   07/02/22 2100 07/02/22 2201  BP: 116/61   Pulse: 67   Resp: 18   Temp:  97.7 F (36.5 C)  SpO2: 94%      General: Awake, no distress.  CV:  Good peripheral perfusion. RRR. No murmurs. Resp:  Normal effort.  Abd:  No distention.  Other:  Normal strength and sensation.   ED Results / Procedures / Treatments   Labs (all labs ordered are listed, but only abnormal results are displayed) Labs Reviewed  BASIC METABOLIC PANEL - Abnormal; Notable for the following components:      Result Value   Sodium 131 (*)    Potassium >7.5 (*)    CO2 18 (*)    Glucose, Bld 163 (*)    BUN 41 (*)    Creatinine, Ser 2.12 (*)    GFR, Estimated 31 (*)    All other  components within normal limits  CBC - Abnormal; Notable for the following components:   WBC 11.0 (*)    All other components within normal limits  URINALYSIS, ROUTINE W REFLEX MICROSCOPIC - Abnormal; Notable for the following components:   Color, Urine YELLOW (*)    APPearance CLEAR (*)    Glucose, UA >=500 (*)    Bacteria, UA RARE (*)    All other components within normal limits  POTASSIUM - Abnormal; Notable for the following components:   Potassium 6.8 (*)    All other components within normal limits  POTASSIUM - Abnormal; Notable for the following components:   Potassium 5.6 (*)    All other components within normal limits  CBG MONITORING, ED - Abnormal; Notable for the following components:   Glucose-Capillary 160 (*)    All other components within normal limits  CBG MONITORING, ED - Abnormal; Notable for the following components:   Glucose-Capillary 104 (*)  All other components within normal limits  MAGNESIUM  CK  POTASSIUM  POTASSIUM  CBC  POTASSIUM  POTASSIUM  POTASSIUM  POTASSIUM     EKG NSR, VR 84. PR 278, QRS 188, QTc 491, no acute ST elevations or depressions. No ischemia or infarct.   RADIOLOGY CXR: Cardiomegaly ow unremarkable   I also independently reviewed and agree with radiologist interpretations.   PROCEDURES:  Critical Care performed: Yes, see critical care procedure note(s)  .Critical Care  Performed by: Shaune Pollack, MD Authorized by: Shaune Pollack, MD   Critical care provider statement:    Critical care time (minutes):  30   Critical care time was exclusive of:  Separately billable procedures and treating other patients   Critical care was necessary to treat or prevent imminent or life-threatening deterioration of the following conditions:  Circulatory failure, cardiac failure, respiratory failure and metabolic crisis   Critical care was time spent personally by me on the following activities:  Development of treatment plan with  patient or surrogate, discussions with consultants, evaluation of patient's response to treatment, examination of patient, ordering and review of laboratory studies, ordering and review of radiographic studies, ordering and performing treatments and interventions, pulse oximetry, re-evaluation of patient's condition and review of old charts     MEDICATIONS ORDERED IN ED: Medications  allopurinol (ZYLOPRIM) tablet 300 mg (has no administration in time range)  aspirin chewable tablet 81 mg (has no administration in time range)  atorvastatin (LIPITOR) tablet 20 mg (has no administration in time range)  nitroGLYCERIN (NITROSTAT) SL tablet 0.4 mg (has no administration in time range)  ranolazine (RANEXA) 12 hr tablet 1,000 mg (1,000 mg Oral Given 07/02/22 2116)  tamsulosin (FLOMAX) capsule 0.4 mg (has no administration in time range)  carvedilol (COREG) tablet 3.125 mg (3.125 mg Oral Given 07/02/22 1755)  insulin aspart (novoLOG) injection 0-15 Units (3 Units Subcutaneous Given 07/02/22 1755)  enoxaparin (LOVENOX) injection 30 mg (30 mg Subcutaneous Given 07/02/22 2117)  ondansetron (ZOFRAN) tablet 4 mg (has no administration in time range)    Or  ondansetron (ZOFRAN) injection 4 mg (has no administration in time range)  sodium bicarbonate 150 mEq in sterile water 1,150 mL infusion ( Intravenous New Bag/Given 07/02/22 1804)  pantoprazole (PROTONIX) EC tablet 40 mg (has no administration in time range)  insulin aspart (novoLOG) injection 5 Units (5 Units Intravenous Given 07/02/22 1540)  dextrose 50 % solution 50 mL (50 mLs Intravenous Given 07/02/22 1539)  albuterol (PROVENTIL) (2.5 MG/3ML) 0.083% nebulizer solution 5 mg (5 mg Nebulization Given 07/02/22 1540)  sodium zirconium cyclosilicate (LOKELMA) packet 10 g (10 g Oral Given 07/02/22 1608)  sodium bicarbonate injection 50 mEq (50 mEq Intravenous Given 07/02/22 1539)  calcium gluconate 1 g/ 50 mL sodium chloride IVPB (0 mg Intravenous Stopped 07/02/22  1801)  albuterol (PROVENTIL) (2.5 MG/3ML) 0.083% nebulizer solution 2.5 mg (2.5 mg Nebulization Given 07/02/22 1914)  albuterol (PROVENTIL) (2.5 MG/3ML) 0.083% nebulizer solution 2.5 mg (2.5 mg Nebulization Given 07/02/22 1913)     IMPRESSION / MDM / ASSESSMENT AND PLAN / ED COURSE  I reviewed the triage vital signs and the nursing notes.                              Differential diagnosis includes, but is not limited to, hyperkalemia, AKI, medication adverse effect, CHF.  Patient's presentation is most consistent with acute presentation with potential threat to life or bodily function.  The  patient is on the cardiac monitor to evaluate for evidence of arrhythmia and/or significant heart rate changes.  80 yo M with h/o CHF here with hyperkalemia on outside labs. Labs confirm K>7.5. Cr 2.12 which is slightly above his priors but not significantly. Suspect this is 2/2 aggressive diuresis and K supplementation with mild AKI. Pt given IV insulin, calcium, bicarb, albuterol and lokelma for temporization and excretion. He has no ectopy on telemetry. WIll admit to medicine. Dr. Thedore Mins of Nephrology was made aware of pt and is in agreement. Fluids given as well (cautiously in setting of his CHF). Hospitalist to admit.     FINAL CLINICAL IMPRESSION(S) / ED DIAGNOSES   Final diagnoses:  Hyperkalemia  Acute renal failure superimposed on chronic kidney disease, unspecified acute renal failure type, unspecified CKD stage (HCC)     Rx / DC Orders   ED Discharge Orders     None        Note:  This document was prepared using Dragon voice recognition software and may include unintentional dictation errors.   Shaune Pollack, MD 07/03/22 [id number]

## 2022-07-02 NOTE — Patient Instructions (Signed)
No change in medications. Labs drawn today - will call you if abnormal. Return to see your Cardiologist as scheduled.

## 2022-07-02 NOTE — Assessment & Plan Note (Addendum)
Presumed secondary to over-diuresis. Lasix held on admission and patient given IV fluids. Repeat BMP as an outpatient to ensure resolution.

## 2022-07-03 ENCOUNTER — Encounter: Payer: Self-pay | Admitting: Internal Medicine

## 2022-07-03 ENCOUNTER — Other Ambulatory Visit: Payer: Self-pay

## 2022-07-03 DIAGNOSIS — E875 Hyperkalemia: Secondary | ICD-10-CM | POA: Diagnosis not present

## 2022-07-03 DIAGNOSIS — N179 Acute kidney failure, unspecified: Secondary | ICD-10-CM | POA: Diagnosis not present

## 2022-07-03 DIAGNOSIS — N1831 Chronic kidney disease, stage 3a: Secondary | ICD-10-CM | POA: Diagnosis not present

## 2022-07-03 LAB — CBG MONITORING, ED
Glucose-Capillary: 104 mg/dL — ABNORMAL HIGH (ref 70–99)
Glucose-Capillary: 84 mg/dL (ref 70–99)
Glucose-Capillary: 211 mg/dL — ABNORMAL HIGH (ref 70–99)

## 2022-07-03 LAB — POTASSIUM
Potassium: 4.8 mmol/L (ref 3.5–5.1)
Potassium: 4.6 mmol/L (ref 3.5–5.1)
Potassium: 4.6 mmol/L (ref 3.5–5.1)
Potassium: 5 mmol/L (ref 3.5–5.1)

## 2022-07-03 LAB — CBC
HCT: 35.6 % — ABNORMAL LOW (ref 39.0–52.0)
Hemoglobin: 11.6 g/dL — ABNORMAL LOW (ref 13.0–17.0)
MCH: 29.1 pg (ref 26.0–34.0)
MCHC: 32.6 g/dL (ref 30.0–36.0)
MCV: 89.2 fL (ref 80.0–100.0)
Platelets: 276 10*3/uL (ref 150–400)
RBC: 3.99 MIL/uL — ABNORMAL LOW (ref 4.22–5.81)
RDW: 14.6 % (ref 11.5–15.5)
WBC: 8.5 10*3/uL (ref 4.0–10.5)
nRBC: 0 % (ref 0.0–0.2)

## 2022-07-03 NOTE — Discharge Instructions (Addendum)
[name].,  You were in the hospital because of a high potassium. This seems to be because of taking too much potassium. The nephrologist was consulted because you also have kidney disease which can affect potassium, but their service has cleared you for discharge from their standpoint and recommend to hold your potassium. Because of your renal function, I have recommended to stop your metformin for now until you have repeat lab work done to recheck your kidney function. Please ensure you get repeat labs early next week either from your PCP or your cardiologist.

## 2022-07-03 NOTE — Consult Note (Signed)
[facility] Consult Note:    Date of Admission:  07/02/2022           Reason for Consult:     Referring Provider: No att. providers found Primary Care Provider: Alfredia Ferguson, [name]   History of Presenting Illness:  [name] Flavin Dockstader Sr. is a 81 y.o. male with medical problems of heart failure with preserved ejection fraction, coronary artery disease, hypertension, hyperlipidemia and CKD.  He presented to the ER for evaluation of high potassium. Potassium level at presentation on December 6 was 6.5.  Repeat check at 1452 showed potassium of greater than 7.5.  Patient was thought to be dehydrated and given IV fluids, in addition to shifting measures. Subsequent potassium improved to 5.6 and then in the mid fours range.   Patient was taking potassium supplementation as outpatient  He had developedMild acute kidney injury.  His baseline creatinine from 03/05/2022 is 1.25.  Presenting creatinine of 2.10.  Patient reports good urine output today. Urinalysis shows greater than 500 glucose.  Review of Systems: Review of Systems  Constitutional:  Negative for chills, fever and weight loss.  HENT:  Negative for hearing loss.   Eyes:  Negative for blurred vision and double vision.  Respiratory:  Negative for cough and hemoptysis.   Cardiovascular:  Negative for chest pain and palpitations.  Gastrointestinal:  Negative for heartburn and nausea.  Genitourinary:  Negative for dysuria and urgency.  Musculoskeletal:  Negative for back pain and neck pain.  Skin:  Negative for rash.  Neurological:  Negative for dizziness and headaches.  Endo/Heme/Allergies:  Does not bruise/bleed easily.  Psychiatric/Behavioral:  Negative for depression.     Past Medical History:  Diagnosis Date   Arthritis    Back pain    hx   CAD (coronary artery disease)    a. 2004 s/p CABG x 4 (LIMA->LAD, VG->Diag, VG->OM, VG->RCA); b. 2007 Cath: VG->Diag occluded, otw 3/4 patent grafts.    CKD (chronic kidney disease), stage II-III    Colon polyps    DM2 (diabetes mellitus, type 2) (HCC)    stable   GERD (gastroesophageal reflux disease)    Glaucoma    Gout    HLD (hyperlipidemia)    HTN (hypertension)    Neuropathy     Social History   Tobacco Use   Smoking status: Never   Smokeless tobacco: Current    Types: Chew   Tobacco comments:    chew tobacco - 1 pack last 3 weeks  Vaping Use   Vaping Use: Never used  Substance Use Topics   Alcohol use: No    Alcohol/week: 0.0 standard drinks of alcohol   Drug use: No    Family History  Problem Relation Age of Onset   Stroke Mother      OBJECTIVE: Blood pressure 122/66, pulse 60, temperature 98 F (36.7 C), resp. rate 19, height 5\' 10"  (1.778 m), weight 75.4 kg, SpO2 98 %.  Physical Exam Physical Exam: General:  No acute distress, laying in the bed  HEENT  anicteric, moist oral mucous membrane  Pulm/lungs  normal breathing effort, lungs are clear to auscultation  CVS/Heart  regular rhythm, no rub or gallop  Abdomen:   Soft, nontender  Extremities:  No peripheral edema  Neurologic:  Alert, oriented, able to follow commands  Skin:  No acute rashes    Lab Results Lab Results  Component Value Date   WBC 8.5 07/03/2022   HGB 11.6 (L) 07/03/2022   HCT 35.6 (  L) 07/03/2022   MCV 89.2 07/03/2022   PLT 276 07/03/2022    Lab Results  Component Value Date   CREATININE 2.12 (H) 07/02/2022   BUN 41 (H) 07/02/2022   NA 131 (L) 07/02/2022   K 5.0 07/03/2022   CL 101 07/02/2022   CO2 18 (L) 07/02/2022    Lab Results  Component Value Date   ALT 52 (H) 06/16/2022   AST 27 06/16/2022   ALKPHOS 58 06/16/2022   BILITOT 0.6 06/16/2022     Microbiology: No results found for this or any previous visit (from the past 240 hour(s)).  Medications: Scheduled Meds:  allopurinol  300 mg Oral Daily   aspirin  81 mg Oral Daily   atorvastatin  20 mg Oral Daily   carvedilol  3.125 mg Oral BID WC   enoxaparin  (LOVENOX) injection  30 mg Subcutaneous Q24H   insulin aspart  0-15 Units Subcutaneous TID WC   pantoprazole  40 mg Oral Daily   ranolazine  1,000 mg Oral BID   sodium chloride flush  3 mL Intravenous Q12H   tamsulosin  0.4 mg Oral Daily   Continuous Infusions: PRN Meds:.nitroGLYCERIN, ondansetron **OR** ondansetron (ZOFRAN) IV  Allergies  Allergen Reactions   Ramipril     Urinalysis: Recent Labs    07/02/22 2126  COLORURINE YELLOW*  LABSPEC 1.010  PHURINE 6.0  GLUCOSEU >=500*  HGBUR NEGATIVE  BILIRUBINUR NEGATIVE  KETONESUR NEGATIVE  PROTEINUR NEGATIVE  NITRITE NEGATIVE  LEUKOCYTESUR NEGATIVE      Imaging: DG Chest Port 1 View  Result Date: 07/02/2022 CLINICAL DATA:  Dizziness EXAM: PORTABLE CHEST 1 VIEW COMPARISON:  06/16/2022 FINDINGS: Cardiomegaly status post median sternotomy and CABG. Both lungs are clear. The visualized skeletal structures are unremarkable. IMPRESSION: Cardiomegaly without acute abnormality of the lungs in AP portable projection. Electronically Signed   By: Jearld Lesch M.D.   On: 07/02/2022 15:22      Assessment/Plan:  [name] Gillson Sr. is a 81 y.o. male with medical problems of heart failure with preserved ejection fraction, coronary artery disease, hypertension, hyperlipidemia     was admitted on 07/02/2022 for :  Hyperkalemia [E87.5]  Hyperkalemia and AKI Likely due to aggressive supplementation Potassium level corrected with IV hydration.  Urine output improved significantly with IV hydration also. Potassium level today is normal Patient okay to discharge and follow-up as outpatient with PCP If creatinine does not improve, he can be referred to nephrology for further evaluation. 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 Atherosclerosis, empagliflozin for CV risk reduction    [name] Thedore Mins 07/03/22

## 2022-07-03 NOTE — Discharge Summary (Signed)
Physician Discharge Summary   Patient: [name] Strawser Sr. MRN: [id number] DOB: September 26, 1940  Admit date:     07/02/2022  Discharge date: 07/03/2022  Discharge Physician: [name] Poche, MD   PCP: [name] Kirschner, [name]   Recommendations at discharge:  Follow-up with PCP Repeat BMP  Discharge Diagnoses: Principal Problem:   Hyperkalemia Active Problems:   AKI (acute kidney injury) ([place])   GERD   CAD (coronary artery disease)   Chronic combined systolic and diastolic CHF (congestive heart failure) (HCC)   CKD stage 3 due to type 2 diabetes mellitus ([place])  Resolved Problems:   * No resolved hospital problems. [facility] Course: [name] with a history of CAD, diastolic heart failure, pulmonary hypertension, diabetes mellitus type 2, CKD stage IIIa. Patient presented secondary to abnormal labs consistent with severe hyperkalemia. Patient had associated EKG changes and was treated urgently in the ED with eventual resolution of hyperkalemia. Patient also with an AKI which improved prior to discharge. Recommendation for outpatient follow-up and outpatient BMP.  Assessment and Plan: * Hyperkalemia Likely secondary to overdose. Patient states he was taking two pills per day, although was prescribed one per day. Patient received calcium gluconate, sodium bicarbonate, dextrose, insulin and Lokelma for an initial potassium of 6.5 with peak potassium >7.5 with associated EKG changes. Potassium with steady decline to 4.6-5 with stabilization. Nephrology was consulted and agreed with discharge and to discontinue potassium; no concern for CKD contributing to hyperkalemia.  AKI (acute kidney injury) ([place]) Presumed secondary to over-diuresis. Lasix held on admission and patient given IV fluids. Repeat BMP as an outpatient to ensure resolution.  Type 2 diabetes mellitus with hyperlipidemia (HCC) Metformin discontinued secondary to renal function; can likely  resume once AKI is completely resolved.  CKD stage 3 due to type 2 diabetes mellitus (HCC) Baseline creatinine of about 1.6.   Chronic combined systolic and diastolic CHF (congestive heart failure) (HCC) Stable without exacerbation.  CAD (coronary artery disease) Stable Continue home regimen.      Consultants: Nephrology Procedures performed: None  Disposition: Home Diet recommendation: Cardiac and Carb modified diet   DISCHARGE MEDICATION: Allergies as of 07/03/2022       Reactions   Ramipril         Medication List     STOP taking these medications    metFORMIN 1000 MG tablet Commonly known as: GLUCOPHAGE   potassium chloride SA 20 MEQ tablet Commonly known as: KLOR-CON M       TAKE these medications    Accu-Chek Aviva Plus test strip Generic drug: glucose blood USE AS DIRECTED TO CHECK FASTING BLOOD SUGAR EVERY MORNING   Accu-Chek Aviva Plus w/Device Kit To check blood sugar once daily   allopurinol 300 MG tablet Commonly known as: ZYLOPRIM TAKE 1 TABLET BY MOUTH ONCE DAILY   aspirin 81 MG chewable tablet Chew 81 mg by mouth daily.   atorvastatin 20 MG tablet Commonly known as: LIPITOR TAKE 1 TABLET BY MOUTH ONCE DAILY   BD [employer] Home Misc Dispose of syringes after B12 injections   carvedilol 6.25 MG tablet Commonly known as: COREG Take 1 tablet (6.25 mg total) by mouth 2 (two) times daily. Needs appointment for future refills  / 1st attempt   cetirizine 10 MG tablet Commonly known as: ZYRTEC Take 10 mg by mouth daily. Take 1 tablet daily as needed for allergies   empagliflozin 10 MG Tabs tablet Commonly known as: Jardiance Take 1  tablet (10 mg total) by mouth daily.   furosemide 20 MG tablet Commonly known as: LASIX Take 1-2 tablets (20-40 mg total) by mouth 2 (two) times daily. Take 19m on morning and 251min evening   glipiZIDE 10 MG 24 hr tablet Commonly known as: GLUCOTROL XL Take 1 tablet (10 mg total) by mouth  daily with breakfast.   Januvia 100 MG tablet Generic drug: sitaGLIPtin TAKE 1 TABLET BY MOUTH ONCE DAILY   nitroGLYCERIN 0.4 MG SL tablet Commonly known as: NITROSTAT Place 1 tablet (0.4 mg total) under the tongue every 5 (five) minutes as needed for chest pain.   ranolazine 1000 MG SR tablet Commonly known as: RANEXA Take 1 tablet (1,000 mg total) by mouth 2 (two) times daily.   SYRINGE-NEEDLE (DISP) 3 ML 25G X 5/8" 3 ML Misc Commonly known as: B-D SYRINGE/NEEDLE 3CC/25GX5/8 For use with cyanocobalamin injections   tamsulosin 0.4 MG Caps capsule Commonly known as: FLOMAX Take 1 capsule (0.4 mg total) by mouth daily.        Discharge Exam: BP 122/66   Pulse 60   Temp 98 F (36.7 C)   Resp 19   Ht _0  (1.778 m)   Wt 75.4 kg   SpO2 98%   BMI 23.85 kg/m   General exam: Appears calm and comfortable Respiratory system: Clear to auscultation. Respiratory effort normal. Cardiovascular system: S1 & S2 heard Gastrointestinal system: Abdomen is nondistended, soft and nontender. No organomegaly or masses felt. Normal bowel sounds heard. Central nervous system: Alert and oriented. No focal neurological deficits. Musculoskeletal: No calf tenderness Skin: No cyanosis. No rashes Psychiatry: Judgement and insight appear normal. Mood & affect appropriate.   Condition at discharge: stable  The results of significant diagnostics from this hospitalization (including imaging, microbiology, ancillary and laboratory) are listed below for reference.   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 Both lungs are clear. The visualized skeletal structures are unremarkable. IMPRESSION: Cardiomegaly without acute abnormality of the lungs in AP portable projection. Electronically Signed   By: AlDelanna Ahmadi.D.   On: 07/02/2022 15:22    ECHOCARDIOGRAM COMPLETE  Result Date: 06/18/2022    ECHOCARDIOGRAM REPORT   Patient Name:   [name] Date of Exam: 06/18/2022 Medical Rec #:  [id number]                 Height:       70.0 in Accession #:    [id number]                Weight:       171.7 lb Date of Birth:  4/02-10-1940                 BSA:          1.956 m Patient Age:    [age]ears                    BP:           158/61 mmHg Patient Gender: M                           HR:           64 bpm. Exam Location:  Inpatient Procedure: 2D Echo, 3D Echo,  Cardiac Doppler and Color Doppler Indications:    R06.02 SOB  History:        Patient has prior history of Echocardiogram examinations, most                 recent 02/19/2022. CHF, CAD, Abnormal ECG, Arrythmias:RBBB; Risk                 Factors:Dyslipidemia and Diabetes.  Sonographer:    Roseanna Rainbow RDCS Referring Phys: [place]  1. Left ventricular ejection fraction, by estimation, is 45 to 50%. The left ventricle has mildly decreased function. The left ventricle demonstrates global hypokinesis. The left ventricular internal cavity size was mildly dilated. Left ventricular diastolic parameters are consistent with Grade I diastolic dysfunction (impaired relaxation).  2. Right ventricular systolic function is mildly reduced. The right ventricular size is normal. There is normal pulmonary artery systolic pressure.  3. Left atrial size was mildly dilated.  4. The mitral valve is grossly normal. No evidence of mitral valve regurgitation. No evidence of mitral stenosis.  5. The aortic valve is tricuspid. There is moderate calcification of the aortic valve. Aortic valve regurgitation is not visualized. Aortic valve sclerosis/calcification is present, without any evidence of aortic stenosis.  6. The inferior vena cava is dilated in size with >50% respiratory variability, suggesting right atrial pressure of 8 mmHg. Comparison(s): Prior images reviewed side by side. LV is  slightly dilated from prior. FINDINGS  Left Ventricle: Left ventricular ejection fraction, by estimation, is 45 to 50%. The left ventricle has mildly decreased function. The left ventricle demonstrates global hypokinesis. 3D left ventricular ejection fraction analysis performed but not reported based on interpreter judgement due to suboptimal tracking. The left ventricular internal cavity size was mildly dilated. There is no left ventricular hypertrophy. Left ventricular diastolic parameters are consistent with Grade I diastolic dysfunction (impaired relaxation). Right Ventricle: The right ventricular size is normal. No increase in right ventricular wall thickness. Right ventricular systolic function is mildly reduced. There is normal pulmonary artery systolic pressure. The tricuspid regurgitant velocity is 2.64 m/s, and with an assumed right atrial pressure of 8 mmHg, the estimated right ventricular systolic pressure is 89.3 mmHg. Left Atrium: Left atrial size was mildly dilated. Right Atrium: Right atrial size was normal in size. Pericardium: There is no evidence of pericardial effusion. Mitral Valve: The mitral valve is grossly normal. No evidence of mitral valve regurgitation. No evidence of mitral valve stenosis. Tricuspid Valve: The tricuspid valve is normal in structure. Tricuspid valve regurgitation is mild . No evidence of tricuspid stenosis. Aortic Valve: The aortic valve is tricuspid. There is moderate calcification of the aortic valve. There is mild aortic valve annular calcification. Aortic valve regurgitation is not visualized. Aortic valve sclerosis/calcification is present, without any  evidence of aortic stenosis. Aortic valve mean gradient measures 6.0 mmHg. Aortic valve peak gradient measures 8.7 mmHg. Aortic valve area, by VTI measures 2.05 cm. Pulmonic Valve: The pulmonic valve was grossly normal. Pulmonic valve regurgitation is not visualized. No evidence of pulmonic stenosis. Aorta: The  aortic root, ascending aorta and aortic arch are all structurally normal, with no evidence of dilitation or obstruction. Venous: The inferior vena cava is dilated in size with greater than 50% respiratory variability, suggesting right atrial pressure of 8 mmHg. IAS/Shunts: The interatrial septum appears to be lipomatous. No atrial level shunt detected by color flow Doppler.  LEFT VENTRICLE PLAX 2D LVIDd:         5.90 cm  Diastology LVIDs:         4.20 cm      LV e' medial:    8.38 cm/s LV PW:         1.10 cm      LV E/e' medial:  11.9 LV IVS:        0.90 cm      LV e' lateral:   9.14 cm/s LVOT diam:     2.40 cm      LV E/e' lateral: 10.9 LV SV:         66 LV SV Index:   34 LVOT Area:     4.52 cm                              3D Volume EF: LV Volumes (MOD)            3D EF:        42 % LV vol d, MOD A2C: 130.0 ml LV EDV:       210 ml LV vol d, MOD A4C: 143.0 ml LV ESV:       123 ml LV vol s, MOD A2C: 75.6 ml  LV SV:        87 ml LV vol s, MOD A4C: 85.8 ml LV SV MOD A2C:     54.4 ml LV SV MOD A4C:     143.0 ml LV SV MOD BP:      57.2 ml RIGHT VENTRICLE            IVC RV S prime:     7.83 cm/s  IVC diam: 2.60 cm TAPSE (M-mode): 1.3 cm LEFT ATRIUM             Index        RIGHT ATRIUM           Index LA diam:        4.00 cm 2.04 cm/m   RA Area:     19.80 cm LA Vol (A2C):   76.6 ml 39.16 ml/m  RA Volume:   55.90 ml  28.57 ml/m LA Vol (A4C):   37.1 ml 18.96 ml/m LA Biplane Vol: 51.4 ml 26.27 ml/m  AORTIC VALVE AV Area (Vmax):    2.25 cm AV Area (Vmean):   1.97 cm AV Area (VTI):     2.05 cm AV Vmax:           147.67 cm/s AV Vmean:          103.733 cm/s AV VTI:            0.322 m AV Peak Grad:      8.7 mmHg AV Mean Grad:      6.0 mmHg LVOT Vmax:         73.40 cm/s LVOT Vmean:        45.100 cm/s LVOT VTI:          0.146 m LVOT/AV VTI ratio: 0.45  AORTA Ao Root diam: 3.50 cm Ao Asc diam:  3.50 cm MITRAL VALVE               TRICUSPID VALVE MV Area (PHT): 4.60 cm    TR Peak grad:   27.9 mmHg MV Decel Time: 165  msec    TR Vmax:        264.00 cm/s MV E velocity: 99.60 cm/s MV A velocity: 59.20 cm/s  SHUNTS MV E/A ratio:  1.68  Systemic VTI:  0.15 m                            Systemic Diam: 2.40 cm Rudean Haskell MD Electronically signed by Rudean Haskell MD Signature Date/Time: 06/18/2022/12:32:15 PM    Final    [place] ASCITES (ABDOMEN LIMITED)  Result Date: 06/16/2022 CLINICAL DATA:  CHF. EXAM: LIMITED ABDOMEN ULTRASOUND FOR ASCITES TECHNIQUE: Limited ultrasound survey for ascites was performed in all four abdominal quadrants. COMPARISON:  None Available. FINDINGS: No ascites visualized in the 4 abdominal quadrants. There is a right pleural effusion. IMPRESSION: No ascites. Right pleural effusion. Electronically Signed   By: Ronney Asters M.D.   On: 06/16/2022 22:19   DG Chest 2 View  Result Date: 06/16/2022 CLINICAL DATA:  History of CHF. Abnormal breath sounds and shortness of breath. EXAM: CHEST - 2 VIEW COMPARISON:  None recent FINDINGS: Previous median sternotomy and CABG procedure. Small bilateral pleural effusions and mild interstitial edema identified. Subsegmental atelectasis noted within the lung bases. No airspace consolidation. Spondylosis noted within the thoracic spine. IMPRESSION: 1. Mild congestive heart failure. 2. Bibasilar atelectasis. Electronically Signed   By: Kerby Moors M.D.   On: 06/16/2022 10:11    Microbiology: Results for orders placed or performed during the hospital encounter of 06/16/22  Resp Panel by RT-PCR (Flu A&B, Covid) Anterior Nasal Swab     Status: None   Collection Time: 06/16/22  4:08 PM   Specimen: Anterior Nasal Swab  Result Value Ref Range Status   SARS Coronavirus 2 by RT PCR NEGATIVE NEGATIVE Final    Comment: (NOTE) SARS-CoV-2 target nucleic acids are NOT DETECTED.  The SARS-CoV-2 RNA is generally detectable in upper respiratory specimens during the acute phase of infection. The lowest concentration of SARS-CoV-2 viral copies this assay  can detect is 138 copies/mL. A negative result does not preclude SARS-Cov-2 infection and should not be used as the sole basis for treatment or other patient management decisions. A negative result may occur with  improper specimen collection/handling, submission of specimen other than nasopharyngeal swab, presence of viral mutation(s) within the areas targeted by this assay, and inadequate number of viral copies(<138 copies/mL). A negative result must be combined with clinical observations, patient history, and epidemiological information. The expected result is Negative.  Fact Sheet for Patients:  EntrepreneurPulse.com.au  Fact Sheet for Healthcare Providers:  IncredibleEmployment.be  This test is no t yet approved or cleared by the [place] FDA and  has been authorized for detection and/or diagnosis of SARS-CoV-2 by FDA under an Emergency Use Authorization (EUA). This EUA will remain  in effect (meaning this test can be used) for the duration of the COVID-19 declaration under Section 564(b)(1) of the Act, 21 U.S.C.section 360bbb-3(b)(1), unless the authorization is terminated  or revoked sooner.       Influenza A by PCR NEGATIVE NEGATIVE Final   Influenza B by PCR NEGATIVE NEGATIVE Final    Comment: (NOTE) The Xpert Xpress SARS-CoV-2/FLU/RSV plus assay is intended as an aid in the diagnosis of influenza from Nasopharyngeal swab specimens and should not be used as a sole basis for treatment. Nasal washings and aspirates are unacceptable for Xpert Xpress SARS-CoV-2/FLU/RSV testing.  Fact Sheet for Patients: EntrepreneurPulse.com.au  Fact Sheet for Healthcare Providers: IncredibleEmployment.be  This test is not yet approved or cleared by the [place] FDA and has been authorized for detection and/or diagnosis of SARS-CoV-2 by FDA under an Emergency Use Authorization (EUA).  This EUA will  remain in effect (meaning this test can be used) for the duration of the COVID-19 declaration under Section 564(b)(1) of the Act, 21 U.S.C. section 360bbb-3(b)(1), unless the authorization is terminated or revoked.  Performed at [facility], [place] [address] [place], [place] [address]     Labs: CBC: Recent Labs  Lab 07/02/22 1452 07/03/22 0459  WBC 11.0* 8.5  HGB 13.1 11.6*  HCT 40.0 35.6*  MCV 90.9 89.2  PLT 302 300   Basic Metabolic Panel: Recent Labs  Lab 07/02/22 0931 07/02/22 1452 07/02/22 1730 07/02/22 2032 07/03/22 0100 07/03/22 0459 07/03/22 0812  NA 130* 131*  --   --   --   --   --   K 6.5* >7.5* 6.8* 5.6* 4.8 4.6 4.6  CL 98 101  --   --   --   --   --   CO2 21* 18*  --   --   --   --   --   GLUCOSE 163* 163*  --   --   --   --   --   BUN 36* 41*  --   --   --   --   --   CREATININE 2.20* 2.12*  --   --   --   --   --   CALCIUM 9.5 9.5  --   --   --   --   --   MG  --   --  2.1  --   --   --   --    Liver Function Tests: No results for input(s): "AST", "ALT", "ALKPHOS", "BILITOT", "PROT", "ALBUMIN" in the last 168 hours. CBG: Recent Labs  Lab 07/02/22 1723 07/03/22 0056 07/03/22 0813  GLUCAP 160* 104* 84    Discharge time spent: 35 minutes.  Signed: Cordelia Poche, MD [facility] 07/03/2022

## 2022-07-04 ENCOUNTER — Other Ambulatory Visit: Payer: Self-pay | Admitting: Physician Assistant

## 2022-07-04 DIAGNOSIS — G629 Polyneuropathy, unspecified: Secondary | ICD-10-CM

## 2022-07-04 MED ORDER — GABAPENTIN 300 MG PO CAPS: 300.0000 mg | ORAL_CAPSULE | Freq: Two times a day (BID) | ORAL | 0 refills | Status: DC

## 2022-07-04 NOTE — Telephone Encounter (Signed)
Requested medication (s) are due for refill today: no  Requested medication (s) are on the active medication list: yes  Last refill:  09/19/21 #90 5 refills  Future visit scheduled: yes in 5 days   Notes to clinic:  should be out of refills. Do you want to continue refills?     Requested Prescriptions  Pending Prescriptions Disp Refills   gabapentin (NEURONTIN) 400 MG capsule [Pharmacy Med Name: GABAPENTIN 400 MG CAP] 90 capsule 5    Sig: TAKE 1 CAPSULE BY MOUTH 3 TIMES A DAY     Neurology: Anticonvulsants - gabapentin Failed - 07/04/2022 10:01 AM      Failed - Cr in normal range and within 360 days    Creat  Date Value Ref Range Status  06/12/2017 1.43 (H) 0.70 - 1.18 mg/dL Final    Comment:    For patients >55 years of age, the reference limit for Creatinine is approximately 13% higher for people identified as African-American. .    Creatinine, Ser  Date Value Ref Range Status  07/02/2022 2.12 (H) 0.61 - 1.24 mg/dL Final         Passed - Completed PHQ-2 or PHQ-9 in the last 360 days      Passed - Valid encounter within last 12 months    Recent Outpatient Visits           1 week ago Chronic combined systolic and diastolic congestive heart failure [facility])   [facility] Alfredia Ferguson, [name]   2 weeks ago Type 2 diabetes mellitus with diabetic neuropathy, without long-term current use of insulin [facility])   [facility] Ok Edwards, [place], [name]   4 months ago Type 2 diabetes mellitus with complication, without long-term current use of insulin (HCC)   [facility] Ok Edwards, [place], [name]   7 months ago Medicare annual wellness visit, subsequent   [employer], [place], [name]   10 months ago Type 2 diabetes mellitus with complication, without long-term current use of insulin [facility])   [facility] Alfredia Ferguson, [name]       Future Appointments             In 5 days Ok Edwards, Lillia Abed, [name] [employer], [facility]   In 1 week Fransico Michael, [employer], [name] [employer]. [facility]   In 2 months Drubel, Lillia Abed, [name] [facility], [facility]

## 2022-07-06 NOTE — Assessment & Plan Note (Signed)
Metformin discontinued secondary to renal function; can likely resume once AKI is completely resolved.

## 2022-07-06 NOTE — Hospital Course (Signed)
[name] Is a 81 y.o. male with a history of CAD, diastolic heart failure, pulmonary hypertension, diabetes mellitus type 2, CKD stage IIIa. Patient presented secondary to abnormal labs consistent with severe hyperkalemia. Patient had associated EKG changes and was treated urgently in the ED with eventual resolution of hyperkalemia. Patient also with an AKI which improved prior to discharge. Recommendation for outpatient follow-up and outpatient BMP.

## 2022-07-07 NOTE — Progress Notes (Signed)
Cardiology Office Note:    Date:  07/11/2022   ID:  [name] Gross Sr., DOB April 02, 1941, MRN [id number]  PCP:  [name], [name]  [facility] Cardiologist:  [name], MD  [place] Electrophysiologist:  None    Referring MD: [name], [name]   Chief Complaint: 2 month follow-up  History of Present Illness:    [name] Lower Skiff Sr. is a 81 y.o. male with a hx of chronic diastolic heart failure, CAD s/p CABG in 2004, HTN, HLD, DM2, CKD stage 3 who is being seen for 2 month follow-up.  H/o CAD s/p CABG x4 in 2004 with subsequent catheterization in 2007 revealing 3 and 4 patent grafts (occluded vein graft to the diagonal). Recent medication changes include addition of Cialis and discontinuation of Imdur (given interaction between these two mediations). Coreg has also been discontinued with patient previously on Coreg 6.27m BID but no longer taking for unclear reasons at this time.    Myoview 07/2019 was abnormal, high risk test with possible ischemia, LVEF 43%.  Echo 07/2019 showed LVEF 50-55%, G2DD, WMA, mildly dilated LA, moderately elevated pulmonary artery pressure.      Seen 09/06/21 and reported chest pain and was having exertional chest pain. A myoview lexiscan was ordered, this showed ischemia in the inferior wall with perfusion defect, moderate reversible in the inferior wall and apical region, EF 43%, moderate risk scan. Patient was brought in to discuss cardiac catheterization. He was seen 09/19/21 and was set up for outpatient cardiac catheterization.    Cath showed multivessel coronary artery disease including severe diffuse LAD and left circumflex disease.  There is also a short, chronic total occlusion of the mid RCA with bridging with left-to-right collaterals.  Widely patent LIMA to LAD and sequential SVG to OM to OM 2.  Chronically occluded SVG to D1.  Normal LVEF with normal filling pressures.  Unsuccessful attempted PCI to mid RCA due to  inability to cross the lesion using run-through and Fielder XT wires.  Recommendation for escalation of antianginal therapy, Imdur was increased to 120 mg daily.  If the patient has refractory lifestyle limiting symptoms may need referral to CTO team. Appointment was a no-show.    He saw Dr. [name] and CTO PCI was felt be high risk, and activity modification was recommended.   He was admitted 05/2022 with heart failure. Echo showed LVEF 45-50%, RV mildly reduced. He was diuresed 8L w/ IV lasix. He was transitioned to oral lasix. GDMT Was limited by CKD. Jardiance was added. D/c weight was 166lbs.  He saw Advanced Heart failure 07/02/22 and was overall stable.  He was admitted later that day on 12/6 with hyperkalemia with K>7.5. He was taking potassium 234m BID instead of 2010mdaily.Klor-con was stopped. He was also treated for AKI with Scr up to 2.2. Metformin was stopped for AKI.  Today, the patient reports he is stable from a cardiac perspective. No chest pain, shortness of breath, lower leg edema, orthopnea or pnd. He is taking lasix 14m60m the am and 20mg39mthe pm. He has been referred to nephrology. Most recent K was 5.5. He reports he is not taking potassium.    Past Medical History:  Diagnosis Date   Arthritis    Back pain    hx   CAD (coronary artery disease)    a. 2004 s/p CABG x 4 (LIMA->LAD, VG->Diag, VG->OM, VG->RCA); b. 2007 Cath: VG->Diag occluded, otw 3/4 patent grafts.   CKD (chronic kidney  disease), stage II-III    Colon polyps    DM2 (diabetes mellitus, type 2) ([place])    stable   GERD (gastroesophageal reflux disease)    Glaucoma    Gout    HLD (hyperlipidemia)    HTN (hypertension)    Neuropathy     Past Surgical History:  Procedure Laterality Date   BACK SURGERY     Cyst removed   CORONARY ARTERY BYPASS GRAFT  2004   CORONARY STENT INTERVENTION N/A 09/24/2021   Procedure: CORONARY STENT INTERVENTION;  Surgeon: [name], MD;  Location: [place] [facility];  Service: Cardiovascular;  Laterality: N/A;   KNEE SURGERY     LEFT HEART CATH AND CORONARY ANGIOGRAPHY Left 09/24/2021   Procedure: LEFT HEART CATH AND CORONARY ANGIOGRAPHY;  Surgeon: [name], MD;  Location: [place] [facility];  Service: Cardiovascular;  Laterality: Left;    Current Medications: Current Meds  Medication Sig   ACCU-CHEK AVIVA PLUS test strip USE AS DIRECTED TO CHECK FASTING BLOOD SUGAR EVERY MORNING   allopurinol (ZYLOPRIM) 300 MG tablet TAKE 1 TABLET BY MOUTH ONCE DAILY (Patient taking differently: Take 300 mg by mouth daily.)   aspirin 81 MG chewable tablet Chew 81 mg by mouth daily.   atorvastatin (LIPITOR) 20 MG tablet TAKE 1 TABLET BY MOUTH ONCE DAILY (Patient taking differently: Take 20 mg by mouth daily.)   BD SHARPS CONTAINER HOME MISC Dispose of syringes after B12 injections   Blood Glucose Monitoring Suppl (ACCU-CHEK AVIVA PLUS) w/Device KIT To check blood sugar once daily   carvedilol (COREG) 6.25 MG tablet Take 1 tablet (6.25 mg total) by mouth 2 (two) times daily. Needs appointment for future refills  / 1st attempt   cetirizine (ZYRTEC) 10 MG tablet Take 10 mg by mouth daily. Take 1 tablet daily as needed for allergies   empagliflozin (JARDIANCE) 10 MG TABS tablet Take 1 tablet (10 mg total) by mouth daily.   furosemide (LASIX) 20 MG tablet Take 1-2 tablets (20-40 mg total) by mouth 2 (two) times daily. Take 41m on morning and 218min evening   gabapentin (NEURONTIN) 300 MG capsule Take 1 capsule (300 mg total) by mouth 2 (two) times daily.   glipiZIDE (GLUCOTROL XL) 10 MG 24 hr tablet Take 1 tablet (10 mg total) by mouth daily with breakfast.   JANUVIA 100 MG tablet TAKE 1 TABLET BY MOUTH ONCE DAILY   nitroGLYCERIN (NITROSTAT) 0.4 MG SL tablet Place 1 tablet (0.4 mg total) under the tongue every 5 (five) minutes as needed for chest pain.   ranolazine (RANEXA) 1000 MG SR tablet Take 1 tablet (1,000 mg total) by mouth 2 (two) times  daily.   SYRINGE-NEEDLE, DISP, 3 ML (B-D SYRINGE/NEEDLE 3CC/25GX5/8) 25G X 5/8" 3 ML MISC For use with cyanocobalamin injections   tamsulosin (FLOMAX) 0.4 MG CAPS capsule Take 1 capsule (0.4 mg total) by mouth daily.   Current Facility-Administered Medications for the 07/11/22 encounter (Office Visit) with FuKathlen ModyCadence H, [name]  Medication   sodium chloride flush (NS) 0.9 % injection 3 mL     Allergies:   Ramipril   Social History   Socioeconomic History   Marital status: Widowed    Spouse name: Not on file   Number of children: 3   Years of education: Not on file   Highest education level: 8th grade  Occupational History   Occupation: Retired  Tobacco Use   Smoking status: Never   Smokeless tobacco: Current    Types:  Chew   Tobacco comments:    chew tobacco - 1 pack last 3 weeks  Vaping Use   Vaping Use: Never used  Substance and Sexual Activity   Alcohol use: No    Alcohol/week: 0.0 standard drinks of alcohol   Drug use: No   Sexual activity: Not Currently  Other Topics Concern   Not on file  Social History Narrative   Not on file   Social Determinants of Health   Financial Resource Strain: Low Risk  (06/18/2022)   Overall Financial Resource Strain (CARDIA)    Difficulty of Paying Living Expenses: Not hard at all  Food Insecurity: No Food Insecurity (07/03/2022)   Hunger Vital Sign    Worried About Running Out of Food in the Last Year: Never true    Ran Out of Food in the Last Year: Never true  Transportation Needs: No Transportation Needs (07/03/2022)   PRAPARE - Hydrologist (Medical): No    Lack of Transportation (Non-Medical): No  Physical Activity: Not on file  Stress: No Stress Concern Present (03/18/2018)   [place]    Feeling of Stress : Not at all  Social Connections: Unknown (06/23/2017)   Social Connection and Isolation Panel [NHANES]    Frequency of  Communication with Friends and Family: Not on file    Frequency of Social Gatherings with Friends and Family: Not on file    Attends Religious Services: Not on file    Active Member of Clubs or Organizations: Not on file    Attends Archivist Meetings: Not on file    Marital Status: Widowed     Family History: The patient's family history includes Stroke in his mother.  ROS:   Please see the history of present illness.     All other systems reviewed and are negative.  EKGs/Labs/Other Studies Reviewed:    The following studies were reviewed today:  05/2022   1. Left ventricular ejection fraction, by estimation, is 45 to 50%. The  left ventricle has mildly decreased function. The left ventricle  demonstrates global hypokinesis. The left ventricular internal cavity size  was mildly dilated. Left ventricular  diastolic parameters are consistent with Grade I diastolic dysfunction  (impaired relaxation).   2. Right ventricular systolic function is mildly reduced. The right  ventricular size is normal. There is normal pulmonary artery systolic  pressure.   3. Left atrial size was mildly dilated.   4. The mitral valve is grossly normal. No evidence of mitral valve  regurgitation. No evidence of mitral stenosis.   5. The aortic valve is tricuspid. There is moderate calcification of the  aortic valve. Aortic valve regurgitation is not visualized. Aortic valve  sclerosis/calcification is present, without any evidence of aortic  stenosis.   6. The inferior vena cava is dilated in size with >50% respiratory  variability, suggesting right atrial pressure of 8 mmHg.   Comparison(s): Prior images reviewed side by side. LV is slightly dilated  from prior.   Echo 01/2022 1. Left ventricular ejection fraction, by estimation, is 50 to 55%. The  left ventricle has low normal function. The left ventricle has no regional  wall motion abnormalities. There is mild left ventricular  hypertrophy.  Left ventricular diastolic  parameters are consistent with Grade III diastolic dysfunction  (restrictive). Elevated left atrial pressure. The average left ventricular  global longitudinal strain is -12.3 %. The global longitudinal strain is  abnormal.  2. Right ventricular systolic function is mildly reduced. The right  ventricular size is normal. There is moderately elevated pulmonary artery  systolic pressure. The estimated right ventricular systolic pressure is  69.7 mmHg.   3. Left atrial size was moderately dilated.   4. Right atrial size was moderately dilated.   5. The mitral valve is normal in structure. Mild mitral valve  regurgitation. No evidence of mitral stenosis.   6. Tricuspid valve regurgitation is mild to moderate.   7. The aortic valve is tricuspid. There is mild calcification of the  aortic valve. There is mild thickening of the aortic valve. Aortic valve  regurgitation is not visualized. Aortic valve sclerosis/calcification is  present, without any evidence of  aortic stenosis.   8. Aortic dilatation noted. There is borderline dilatation of the aortic  root, measuring 39 mm. There is borderline dilatation of the aortic arch,  measuring 31 mm.   9. The inferior vena cava is dilated in size with <50% respiratory  variability, suggesting right atrial pressure of 15 mmHg.   Comparison(s): EF 50%, hypokinesis of the basal septal, anteroseptal,  inferior/inferoseptal regions, RVSP 44.48mHg.     Cardiac cath 09/24/21   Conclusions: Multivessel coronary artery disease including severe diffuse LAD and LCx disease.  There is also a short, chronic total occlusion of the mid RCA with bridging and left-right collaterals. Widely patent LIMA-LAD and sequential SVG-OM1-OM2. Chronically occluded SVG-D1. Normal left ventricular contraction (LVEF 55-65%) with normal filling pressure (LVEDP 10 mmHg). Unsuccessful attempted PCI to mid RCA due to inability to cross  the lesion using Runthrough and Fielder XT wires (including with balloon support).   Recommendations: Escalate antianginal therapy.  I will increase isosorbide mononitrate to 120 mg daily.  If the patient has refractory lifestyle-limiting angina despite maximal tolerated antianginal therapy, referral to our CTO team for possible repeat intervention to the mid RCA could be considered. Aggressive secondary prevention of coronary artery disease.   CNelva Bush MD [facility]   Antiplatelet/Anticoag Continue aspirin 81 mg daily.  Defer adding P2Y12 inhibitor in the setting of unsuccessful PCI today.  Discharge Date In the absence of any other complications or medical issues, we expect the patient to be ready for discharge from an interventional cardiology perspective on 09/24/2021.      Coronary Diagrams   Diagnostic Dominance: Right Intervention       EKG:  EKG is ordered today.  The ekg ordered today demonstrates NSR 63bpm, RBBB, no significant changes  Recent Labs: 06/16/2022: ALT 52 07/02/2022: B Natriuretic Peptide 121.5; Magnesium 2.1 07/03/2022: Hemoglobin 11.6; Platelets 276 07/09/2022: BUN 34; Creatinine, Ser 2.08; Potassium 5.5; Sodium 130  Recent Lipid Panel    Component Value Date/Time   CHOL 114 05/16/2021 0923   TRIG 104 05/16/2021 0923   TRIG 136 02/28/2008 0000   HDL 35 (L) 05/16/2021 0923   CHOLHDL 3.3 05/16/2021 0923   LDLCALC 59 05/16/2021 0923     Physical Exam:    VS:  BP 120/70 (BP Location: Left Arm, Patient Position: Sitting, Cuff Size: Normal)   Pulse 63   Ht _0  (1.778 m)   Wt 163 lb (73.9 kg)   SpO2 99%   BMI 23.39 kg/m     Wt Readings from Last 3 Encounters:  07/11/22 163 lb (73.9 kg)  07/09/22 165 lb 11.2 oz (75.2 kg)  07/02/22 166 lb 3.6 oz (75.4 kg)     GEN:  Well nourished, well developed in no acute distress HEENT: Normal NECK:  No JVD; No carotid bruits LYMPHATICS: No lymphadenopathy CARDIAC: RRR, no murmurs, rubs,  gallops RESPIRATORY:  Clear to auscultation without rales, wheezing or rhonchi  ABDOMEN: Soft, non-tender, non-distended MUSCULOSKELETAL:  No edema; No deformity  SKIN: Warm and dry NEUROLOGIC:  Alert and oriented x 3 PSYCHIATRIC:  Normal affect   ASSESSMENT:    1. Chronic systolic heart failure ([place])   2. 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 Essential hypertension   4. Mixed hyperlipidemia   5. Hyperkalemia   6. CKD (chronic kidney disease), stage II   7. Hyperlipidemia, mixed    PLAN:    In order of problems listed above:  Chronic systolic heart failure Echo 7/23 showed LVEF 50-55%.  Echo November 2023 showed mildly reduced EF at 45 to 50%, RV mildly reduced.  Possibly ischemic given known CAD.  Patient is euvolemic on exam.  He is taking Lasix 40 mg in the a.m. and 20 mg in the p.m.  Potassium stopped given recent hospitalization for hyperkalemia.  Continue Jardiance 10 mg daily and Coreg 6.25 mg twice daily.  CKD is limiting guideline directed medical therapy.  CAD Patient denies anginal symptoms.  Patient has known severe three-vessel CAD.  He saw Dr. Irish Lack to discuss possible CTO treatment, however he was deemed high risk and activity modification was recommended.  Continue aspirin, statin, beta-blocker, and Ranexa.  No further ischemic workup indicated at this time.  HTN Blood pressure is good today, continue current medications.  CKD stage 3 Recent hospitalization with AKI with serum creatinine up to 2.2.  Baseline previously around 1.6.  Most recent labs showed creatinine of 2.08, BUN 34, GFR 32.  Patient has been referred to nephrology.  HLD LDL 59 in 2022. Continue Lipitor.   Hyperkalemia Recent hospitalization for hyperkalemia with a potassium as high as 7.5.  He was taking double dose of potassium, 20 mEq twice daily.  Potassium was stopped.  He reports he is no longer on potassium.  Most recent blood  work showed potassium 5.5.  I will repeat blood work today.  As above, he is seeing nephrology.  Disposition: Follow up in 3 month(s) with MD/APP     Signed, Gaylene Moylan Ninfa Meeker, [name]  07/11/2022 8:45 AM    [place]

## 2022-07-09 ENCOUNTER — Encounter: Payer: Self-pay | Admitting: Physician Assistant

## 2022-07-09 ENCOUNTER — Ambulatory Visit: Payer: Medicare PPO | Admitting: Physician Assistant

## 2022-07-09 VITALS — BP 115/67 | HR 74 | Temp 97.7°F | Wt 165.7 lb

## 2022-07-09 DIAGNOSIS — E1122 Type 2 diabetes mellitus with diabetic chronic kidney disease: Secondary | ICD-10-CM

## 2022-07-09 DIAGNOSIS — E875 Hyperkalemia: Secondary | ICD-10-CM | POA: Diagnosis not present

## 2022-07-09 DIAGNOSIS — N183 Chronic kidney disease, stage 3 unspecified: Secondary | ICD-10-CM

## 2022-07-09 NOTE — Assessment & Plan Note (Signed)
2/2 to accidental overdose on prescribed meds. Repeat bmp

## 2022-07-09 NOTE — Progress Notes (Signed)
Established patient visit   Patient: [name] Zanetti Sr.   DOB: 1941/04/30   81 y.o. Male  MRN: [id number] Visit Date: 07/09/2022  Today's healthcare provider: [name], [name]   Chief Complaint  Patient presents with   Hospitalization Follow-up   Subjective    HPI  Follow up Hospitalization  Patient was admitted to [facility] observation on 12/06 and discharged on 12/08. He was treated for hyperkalemia 2/2 accidental overdose, with EKG changes. Taking double prescribed K replacement.  Treatment for this included IV calcium gluconate, sodium bicarbonate, dextrose, insulin and Lokelma.  Telephone follow up was done on n/a He reports excellent compliance with treatment. He reports this condition is resolved.  Pt has no other concerns today. Denies SOB, swelling, new cough. ----------------------------------------------------------------------------------------- -  Medications: Outpatient Medications Prior to Visit  Medication Sig   ACCU-CHEK AVIVA PLUS test strip USE AS DIRECTED TO CHECK FASTING BLOOD SUGAR EVERY MORNING   allopurinol (ZYLOPRIM) 300 MG tablet TAKE 1 TABLET BY MOUTH ONCE DAILY (Patient taking differently: Take 300 mg by mouth daily.)   aspirin 81 MG chewable tablet Chew 81 mg by mouth daily.   atorvastatin (LIPITOR) 20 MG tablet TAKE 1 TABLET BY MOUTH ONCE DAILY (Patient taking differently: Take 20 mg by mouth daily.)   BD SHARPS CONTAINER HOME MISC Dispose of syringes after B12 injections   Blood Glucose Monitoring Suppl (ACCU-CHEK AVIVA PLUS) w/Device KIT To check blood sugar once daily   carvedilol (COREG) 6.25 MG tablet Take 1 tablet (6.25 mg total) by mouth 2 (two) times daily. Needs appointment for future refills  / 1st attempt   cetirizine (ZYRTEC) 10 MG tablet Take 10 mg by mouth daily. Take 1 tablet daily as needed for allergies   empagliflozin (JARDIANCE) 10 MG TABS tablet Take 1 tablet (10 mg total) by mouth daily.   furosemide (LASIX) 20  MG tablet Take 1-2 tablets (20-40 mg total) by mouth 2 (two) times daily. Take 42m on morning and 247min evening   gabapentin (NEURONTIN) 300 MG capsule Take 1 capsule (300 mg total) by mouth 2 (two) times daily.   glipiZIDE (GLUCOTROL XL) 10 MG 24 hr tablet Take 1 tablet (10 mg total) by mouth daily with breakfast.   JANUVIA 100 MG tablet TAKE 1 TABLET BY MOUTH ONCE DAILY   nitroGLYCERIN (NITROSTAT) 0.4 MG SL tablet Place 1 tablet (0.4 mg total) under the tongue every 5 (five) minutes as needed for chest pain.   ranolazine (RANEXA) 1000 MG SR tablet Take 1 tablet (1,000 mg total) by mouth 2 (two) times daily.   SYRINGE-NEEDLE, DISP, 3 ML (B-D SYRINGE/NEEDLE 3CC/25GX5/8) 25G X 5/8" 3 ML MISC For use with cyanocobalamin injections   tamsulosin (FLOMAX) 0.4 MG CAPS capsule Take 1 capsule (0.4 mg total) by mouth daily.   Facility-Administered Medications Prior to Visit  Medication Dose Route Frequency Provider   sodium chloride flush (NS) 0.9 % injection 3 mL  3 mL Intravenous Q12H Furth, Cadence H, [name]    Review of Systems  Constitutional:  Negative for fatigue and fever.  Respiratory:  Negative for cough and shortness of breath.   Cardiovascular:  Negative for chest pain, palpitations and leg swelling.  Neurological:  Negative for dizziness and headaches.       Objective    Blood pressure 115/67, pulse 74, temperature 97.7 F (36.5 C), temperature source Oral, weight 165 lb 11.2 oz (75.2 kg), SpO2 99 %.   Physical Exam Constitutional:  General: He is awake.     Appearance: He is well-developed.  HENT:     Head: Normocephalic.  Eyes:     Conjunctiva/sclera: Conjunctivae normal.  Cardiovascular:     Rate and Rhythm: Normal rate and regular rhythm.     Heart sounds: Normal heart sounds.  Pulmonary:     Effort: Pulmonary effort is normal.     Breath sounds: Normal breath sounds.  Musculoskeletal:     Right lower leg: No edema.     Left lower leg: No edema.  Skin:     General: Skin is warm.  Neurological:     Mental Status: He is alert and oriented to person, place, and time.  Psychiatric:        Attention and Perception: Attention normal.        Mood and Affect: Mood normal.        Speech: Speech normal.        Behavior: Behavior is cooperative.      No results found for any visits on 07/09/22.  Assessment & Plan     Problem List Items Addressed This Visit       Endocrine   CKD stage 3 due to type 2 diabetes mellitus ([place])    Repeat bmp        Relevant Orders   Basic Metabolic Panel (BMET)     Other   Hyperkalemia - Primary    2/2 to accidental overdose on prescribed meds. Repeat bmp      Relevant Orders   Basic Metabolic Panel (BMET)     Return if symptoms worsen or fail to improve, as scheduled, for chronic conditions.     I, [name], [name] have reviewed all documentation for this visit. The documentation on  07/09/2022  for the exam, diagnosis, procedures, and orders are all accurate and complete.  [name], [name] [facility] [address] [place], [place], [address] Office: [phone] Fax: [phone]

## 2022-07-09 NOTE — Assessment & Plan Note (Signed)
Repeat bmp

## 2022-07-10 ENCOUNTER — Other Ambulatory Visit: Payer: Self-pay | Admitting: Physician Assistant

## 2022-07-10 DIAGNOSIS — E1169 Type 2 diabetes mellitus with other specified complication: Secondary | ICD-10-CM

## 2022-07-10 DIAGNOSIS — E1122 Type 2 diabetes mellitus with diabetic chronic kidney disease: Secondary | ICD-10-CM

## 2022-07-10 DIAGNOSIS — I5043 Acute on chronic combined systolic (congestive) and diastolic (congestive) heart failure: Secondary | ICD-10-CM

## 2022-07-10 LAB — BASIC METABOLIC PANEL
BUN/Creatinine Ratio: 16 (ref 10–24)
BUN: 34 mg/dL — ABNORMAL HIGH (ref 8–27)
CO2: 19 mmol/L — ABNORMAL LOW (ref 20–29)
Calcium: 9.5 mg/dL (ref 8.6–10.2)
Chloride: 93 mmol/L — ABNORMAL LOW (ref 96–106)
Creatinine, Ser: 2.08 mg/dL — ABNORMAL HIGH (ref 0.76–1.27)
Glucose: 368 mg/dL — ABNORMAL HIGH (ref 70–99)
Potassium: 5.5 mmol/L — ABNORMAL HIGH (ref 3.5–5.2)
Sodium: 130 mmol/L — ABNORMAL LOW (ref 134–144)
eGFR: 32 mL/min/{1.73_m2} — ABNORMAL LOW (ref >59)

## 2022-07-11 ENCOUNTER — Encounter: Payer: Self-pay | Admitting: Medical

## 2022-07-11 ENCOUNTER — Ambulatory Visit: Payer: Medicare PPO | Attending: Medical | Admitting: Medical

## 2022-07-11 VITALS — BP 120/70 | HR 63 | Ht 70.0 in | Wt 163.0 lb

## 2022-07-11 DIAGNOSIS — I10 Essential (primary) hypertension: Secondary | ICD-10-CM

## 2022-07-11 DIAGNOSIS — N182 Chronic kidney disease, stage 2 (mild): Secondary | ICD-10-CM

## 2022-07-11 DIAGNOSIS — I5022 Chronic systolic (congestive) heart failure: Secondary | ICD-10-CM

## 2022-07-11 DIAGNOSIS — E782 Mixed hyperlipidemia: Secondary | ICD-10-CM

## 2022-07-11 DIAGNOSIS — E875 Hyperkalemia: Secondary | ICD-10-CM | POA: Diagnosis not present

## 2022-07-11 DIAGNOSIS — I25118 Atherosclerotic heart disease of native coronary artery with other forms of angina pectoris: Secondary | ICD-10-CM | POA: Diagnosis not present

## 2022-07-11 NOTE — Patient Instructions (Signed)
Medication Instructions:  - Your physician recommends that you continue on your current medications as directed. Please refer to the Current Medication list given to you today.  *If you need a refill on your cardiac medications before your next appointment, please call your pharmacy*   Lab Work: - Your physician recommends that you have lab work today:   [employer]  Medical Mall Entrance at [facility] 1st desk on the right to check in (REGISTRATION)  Lab hours: Monday- Friday (7:30 am- 5:30 pm)   If you have labs (blood work) drawn today and your tests are completely normal, you will receive your results only by: MyChart Message (if you have MyChart) OR A paper copy in the mail If you have any lab test that is abnormal or we need to change your treatment, we will call you to review the results.   Testing/Procedures: - none ordered   Follow-Up: At [facility], you and your health needs are our priority.  As part of our continuing mission to provide you with exceptional heart care, we have created designated Provider Care Teams.  These Care Teams include your primary Cardiologist (physician) and Advanced Practice Providers (APPs -  Physician Assistants and Nurse Practitioners) who all work together to provide you with the care you need, when you need it.  We recommend signing up for the patient portal called "MyChart".  Sign up information is provided on this After Visit Summary.  MyChart is used to connect with patients for Virtual Visits (Telemedicine).  Patients are able to view lab/test results, encounter notes, upcoming appointments, etc.  Non-urgent messages can be sent to your provider as well.   To learn more about what you can do with MyChart, go to [email or web address].    Your next appointment:   3 month(s)  The format for your next appointment:   In Person  Provider:   You may see Julien Nordmann, MD or one of the following Advanced Practice Providers on your  designated Care Team:    Cadence Fransico Michael, [place]   Other Instructions N/a  Important Information About Sugar

## 2022-07-12 LAB — BASIC METABOLIC PANEL
BUN/Creatinine Ratio: 17 (ref 10–24)
BUN: 35 mg/dL — ABNORMAL HIGH (ref 8–27)
CO2: 21 mmol/L (ref 20–29)
Calcium: 9.7 mg/dL (ref 8.6–10.2)
Chloride: 94 mmol/L — ABNORMAL LOW (ref 96–106)
Creatinine, Ser: 2.03 mg/dL — ABNORMAL HIGH (ref 0.76–1.27)
Glucose: 283 mg/dL — ABNORMAL HIGH (ref 70–99)
Potassium: 5.1 mmol/L (ref 3.5–5.2)
Sodium: 134 mmol/L (ref 134–144)
eGFR: 33 mL/min/{1.73_m2} — ABNORMAL LOW (ref >59)

## 2022-07-18 ENCOUNTER — Other Ambulatory Visit: Payer: Self-pay | Admitting: Physician Assistant

## 2022-07-18 ENCOUNTER — Telehealth: Payer: Self-pay

## 2022-07-18 ENCOUNTER — Other Ambulatory Visit: Payer: Self-pay | Admitting: Cardiovascular Disease

## 2022-07-18 ENCOUNTER — Ambulatory Visit (INDEPENDENT_AMBULATORY_CARE_PROVIDER_SITE_OTHER): Payer: Medicare PPO

## 2022-07-18 DIAGNOSIS — E118 Type 2 diabetes mellitus with unspecified complications: Secondary | ICD-10-CM

## 2022-07-18 DIAGNOSIS — E1169 Type 2 diabetes mellitus with other specified complication: Secondary | ICD-10-CM

## 2022-07-18 DIAGNOSIS — I5042 Chronic combined systolic (congestive) and diastolic (congestive) heart failure: Secondary | ICD-10-CM

## 2022-07-18 NOTE — Telephone Encounter (Signed)
Medication Refill - Medication: empagliflozin (JARDIANCE) 10 MG TABS tablet [[id number]]  Pt has no pills left and would like refill today before the holidays / please advise   Has the patient contacted their pharmacy? Yes.   (Agent: If no, request that the patient contact the pharmacy for the refill. If patient does not wish to contact the pharmacy document the reason why and proceed with request.) (Agent: If yes, when and what did the pharmacy advise?) they sent request to cardiologist office   Preferred Pharmacy (with phone number or street name): [facility] - [place] - [address]  Has the patient been seen for an appointment in the last year OR does the patient have an upcoming appointment? Yes.    Agent: Please be advised that RX refills may take up to 3 business days. We ask that you follow-up with your pharmacy.
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
## 2022-07-18 NOTE — Plan of Care (Signed)
Chronic Care Management Provider Comprehensive Care Plan    07/18/2022 Name: [name] Loadholt [facility] Sr. MRN: [id number] DOB: 04-Aug-1940  Referral to Chronic Care Management (CCM) services was placed by Provider:  [name], [name] on Date: 07/10/22.  Chronic Condition 1: Heart Failure Provider Assessment and Plan  CHF (congestive heart failure) ([place]) - Primary       Euvolemic today. Pt has f/u with cardiology       Expected Outcome/Goals Addressed This Visit (Provider CCM goals/Provider Assessment and plan  Goal: CCM (CHF) Expected Outcome: Monitor, Self-Manage and Reduce Symptoms of Congestive Heart Failure   Symptom Management Condition 1: Take medications as prescribed   Attend all scheduled provider appointments Call pharmacy for medication refills 3-7 days in advance of running out of medications Call office for weight gain more than 3 pounds in one day or 5 pounds in one week Keep legs up while sitting Watch for swelling in feet, ankles and legs every day Use salt in moderation Eat more whole grains, fruits and vegetables, lean meats and healthy fats Follow recommended safety and fall prevention measures Call 911 and seek immediate medical attention if experiencing chest pain, palpitations or shortness of breath. Call 911 and seek immediate medical attention if another fall r/t "passing out" occurs.    Chronic Condition 2: Diabetes Provider Assessment and Plan  Type 2 diabetes mellitus with complication, without long-term current use of insulin ([place])       Continue medications, will f/u in three months for A1c check Continue to monitor for hypoglycemia Advised pt to d/c all soda intake      Expected Outcome/Goals Addressed This Visit (Provider CCM goals/Provider Assessment and plan  Goal: CCM (Diabetes) Expected Outcome: Monitor, Self-Manage and Reduce Symptoms of Diabetes  Symptom Management Condition 2: Take medications exactly as prescribed   Attend all  scheduled provider appointments Call pharmacy for medication refills 3-7 days in advance of running out of medications Check fasting blood sugars and record readings Read food labels for fat, fiber, carbohydrates and portion size Check feet daily for cuts, sores or redness Wash and dry feet carefully every day Wear comfortable, cotton socks Wear comfortable, well-fitting shoes Schedule appointment with eye doctor Call provider office for new concerns or questions   Problem List Patient Active Problem List   Diagnosis Date Noted   Hyperkalemia 07/02/2022   AKI (acute kidney injury) ([place]) 07/02/2022   CKD stage 3 due to type 2 diabetes mellitus ([place]) 07/02/2022   Acute on chronic diastolic CHF (congestive heart failure) ([place]) 06/16/2022   CHF (congestive heart failure) ([place]) 06/16/2022   Type 2 diabetes mellitus with complication, without long-term current use of insulin ([place]) 02/25/2022   Chronic combined systolic and diastolic CHF (congestive heart failure) ([place]) 02/25/2022   Anemia 02/25/2022   Benign prostatic hyperplasia with urinary hesitancy 02/25/2022   Epithelial inclusion cyst 11/22/2021   Abnormal stress test 09/24/2021   Orthostatic hypotension 04/23/2020   RBBB 12/27/2019   Stage 3a chronic kidney disease (HCC)    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/15/2018   Primary osteoarthritis of both knees 01/20/2018   CAD (coronary artery disease) 08/04/2017   Sebaceous cyst 09/10/2016   Neuropathy 05/22/2016   Type 2 diabetes mellitus with hyperlipidemia ([place]) 11/07/2009   Mixed hyperlipidemia 11/07/2009   GOUT, UNSPECIFIED 11/07/2009   Hypertension associated with diabetes ([place]) 11/07/2009   Accelerating angina ([place]) 11/07/2009   CORONARY ATHEROSLERO AUTOL VEIN BYPASS GRAFT 11/07/2009   GERD 11/07/2009   Backache 11/07/2009  Medication Management  Current Outpatient Medications:    ACCU-CHEK AVIVA PLUS test strip, USE AS DIRECTED TO CHECK FASTING BLOOD SUGAR EVERY MORNING, Disp: 100  each, Rfl: 4   allopurinol (ZYLOPRIM) 300 MG tablet, TAKE 1 TABLET BY MOUTH ONCE DAILY (Patient taking differently: Take 300 mg by mouth daily.), Disp: 90 tablet, Rfl: 0   aspirin 81 MG chewable tablet, Chew 81 mg by mouth daily., Disp: , Rfl:    atorvastatin (LIPITOR) 20 MG tablet, TAKE 1 TABLET BY MOUTH ONCE DAILY (Patient taking differently: Take 20 mg by mouth daily.), Disp: 90 tablet, Rfl: 1   BD SHARPS CONTAINER HOME MISC, Dispose of syringes after B12 injections, Disp: 1 each, Rfl: 5   Blood Glucose Monitoring Suppl (ACCU-CHEK AVIVA PLUS) w/Device KIT, To check blood sugar once daily, Disp: 1 kit, Rfl: 0   carvedilol (COREG) 6.25 MG tablet, Take 1 tablet (6.25 mg total) by mouth 2 (two) times daily. Needs appointment for future refills  / 1st attempt, Disp: 180 tablet, Rfl: 0   cetirizine (ZYRTEC) 10 MG tablet, Take 10 mg by mouth daily. Take 1 tablet daily as needed for allergies, Disp: , Rfl:    empagliflozin (JARDIANCE) 10 MG TABS tablet, Take 1 tablet (10 mg total) by mouth daily., Disp: 30 tablet, Rfl: 0   furosemide (LASIX) 20 MG tablet, Take 1-2 tablets (20-40 mg total) by mouth 2 (two) times daily. Take 48m on morning and 233min evening, Disp: 120 tablet, Rfl: 0   gabapentin (NEURONTIN) 300 MG capsule, Take 1 capsule (300 mg total) by mouth 2 (two) times daily., Disp: 60 capsule, Rfl: 0   glipiZIDE (GLUCOTROL XL) 10 MG 24 hr tablet, Take 1 tablet (10 mg total) by mouth daily with breakfast., Disp: 90 tablet, Rfl: 3   JANUVIA 100 MG tablet, TAKE 1 TABLET BY MOUTH ONCE DAILY, Disp: 90 tablet, Rfl: 1   nitroGLYCERIN (NITROSTAT) 0.4 MG SL tablet, Place 1 tablet (0.4 mg total) under the tongue every 5 (five) minutes as needed for chest pain., Disp: 25 tablet, Rfl: 3   ranolazine (RANEXA) 1000 MG SR tablet, Take 1 tablet (1,000 mg total) by mouth 2 (two) times daily., Disp: 180 tablet, Rfl: 1   SYRINGE-NEEDLE, DISP, 3 ML (B-D SYRINGE/NEEDLE 3CC/25GX5/8) 25G X 5/8" 3 ML MISC, For use with  cyanocobalamin injections, Disp: 50 each, Rfl: 0   tamsulosin (FLOMAX) 0.4 MG CAPS capsule, Take 1 capsule (0.4 mg total) by mouth daily., Disp: 90 capsule, Rfl: 1  Current Facility-Administered Medications:    sodium chloride flush (NS) 0.9 % injection 3 mL, 3 mL, Intravenous, Q12H, Furth, Cadence H, [name]  Cognitive Assessment Identity Confirmed: : Name; DOB Cognitive Status: Normal   Functional Assessment Hearing Difficulty or Deaf: yes Hearing Management: Reports decreased hearing (Reports pending evaluation for hearing aides) Wear Glasses or Blind: no Concentrating, Remembering or Making Decisions Difficulty (CP): no Difficulty Communicating: no Difficulty Eating/Swallowing: no Walking or Climbing Stairs Difficulty: yes Walking or Climbing Stairs: ambulation difficulty, requires equipment Mobility Management: Report using cane Dressing/Bathing Difficulty: no Doing Errands Independently Difficulty (such as shopping) (CP): no Change in Functional Status Since Onset of Current Illness/Injury: no   Caregiver Assessment  Primary Source of Support/Comfort: child(ren); friend; sibling(s) Name of Support/Comfort Primary Source: Children, sister and friend People in Home: alone Family Caregiver if Needed: child(ren), adult Family Caregiver Names: [name])   Planned Interventions  CHF Discussed current plan for CHF management.  Reviewed medications. Reports being unable  to confirm actual doses for medications today, but states family members are assisting with medication management. Discussed importance of taking medications exactly as prescribed. Will follow up within the next week to speak with his sons to confirm doses. Provided information regarding recommended weight parameters. Encouraged to weigh daily and record readings.  Encouraged to notify provider for weight gain greater than 3 lbs overnight or weight gain greater than 5 lbs within a week.  Reports weighing occasionally. Encouraged to try and obtain morning weight at least weekly and record reading. Discussed s/sx of fluid overload and indications for notifying a provider. Denies chest pain, palpitations, or shortness of breath. Denies abdominal or lower extremity edema.  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 Reports falling after getting off of his 4 wheeler. Reports experiencing another fall while in his shower. Describes both incidents as "passing out." Reports a cut and bruise to his arms. Denies head injuries. Declined offer to be evaluated by a provider. Thorough discussion regarding safety measures along with concerns r/t cardiac emergencies d/t patient's cardiac history.  Reports very good support from children and a friend. Agreed to seek immediate medical attention if he experiences another fall or worsening symptoms. Discussed nutritional intake. Advised to closely monitor sodium consumption and avoid highly processed foods when possible. Reviewed worsening s/sx related to CHF exacerbation and indications for seeking immediate medical attention. Agreeable to outreach with the Community Paramedicine/Heart Failure team. Will follow up to arrange routine home visits. Assessed social determinant of health barriers.   Diabetes Reviewed provider's plan for diabetes management. Reviewed medications and discussed importance of medication adherence.  Advised to continue taking all medications exactly as prescribed. Reports children are assisting with medication management. Recalls daughter being unable to obtain one of his prescriptions (Jardiance) from the local pharmacy. Will follow up to confirm changes in medication doses. Will follow up with pharmacy to discuss concerns with medication orders. Provided information regarding importance of consistent blood glucose monitoring. Reports monitoring fasting blood glucose a few times a week. Reports readings have  ranged in the 130's. Advised to monitor daily and record readings. Reviewed s/sx of hypoglycemia and hyperglycemia along with recommended interventions. Denies recent hypoglycemic or hyperglycemic episodes. Thorough discussion regarding safety and fall prevention d/t recent increase in falls. Patient reports monitoring blood glucose levels on days falls occurred. Denies low readings of symptoms of hypoglycemia. Discussed nutritional intake and importance of complying with a diabetic diet. Reports strong family support and assistance with meals as needed. Declines need for additional diabetic nutritional/educational resources.  Discussed importance of completing recommended DM preventive care. Advised to complete foot care and notify provider with changes. Advised to schedule an annual eye exam. Discussed importance of completing ordered labs as prescribed.  Assessed social determinant of health barriers.  Interaction and coordination with outside resources, practitioners, and providers See CCM Referral  Care Plan: Printed and mailed to patient

## 2022-07-18 NOTE — Telephone Encounter (Signed)
Please advise if ok to refill medication under Dr. Mariah Milling as the prescribing physician? This prescription was written on 06/19/2022 by Zannie Cove, MD while the patient was being discharged from the hospital. In Cadence's note on 07/11/2022, she stating for the patient to continue Jardiance 10mg . Thank you so much.

## 2022-07-18 NOTE — Telephone Encounter (Signed)
   CCM RN Visit Note   07/18/22 Name: [name] Retter [facility] Sr. MRN: [id number]      DOB: 28-Oct-1940  Subjective: [name] Flavin Sonn Sr. is a 81 y.o. year old male who is a primary care patient of Alfredia Ferguson, [place]. The patient was referred to the Chronic Care Management team for assistance with care management needs subsequent to provider initiation of CCM services and plan of care.      An unsuccessful telephone outreach was attempted today to contact the patient about Chronic Care Management needs.     Plan: Additional attempts will be made.   Katha Cabal RN Care Manager/Chronic Care Management [phone]

## 2022-07-18 NOTE — Telephone Encounter (Signed)
Unable to refill per protocol, last refill by another provider 07/18/22. Will refuse duplicate request.  Requested Prescriptions  Pending Prescriptions Disp Refills   empagliflozin (JARDIANCE) 10 MG TABS tablet 30 tablet 0    Sig: Take 1 tablet (10 mg total) by mouth daily.     Endocrinology:  Diabetes - SGLT2 Inhibitors Failed - 07/18/2022  1:42 PM      Failed - Cr in normal range and within 360 days    Creat  Date Value Ref Range Status  06/12/2017 1.43 (H) 0.70 - 1.18 mg/dL Final    Comment:    For patients >59 years of age, the reference limit for Creatinine is approximately 13% higher for people identified as African-American. .    Creatinine, Ser  Date Value Ref Range Status  07/11/2022 2.03 (H) 0.76 - 1.27 mg/dL Final         Failed - eGFR in normal range and within 360 days    GFR, Est African American  Date Value Ref Range Status  06/12/2017 55 (L) > OR = 60 mL/min/1.73m Final   GFR calc Af Amer  Date Value Ref Range Status  04/23/2020 66 >59 mL/min/1.73 Final    Comment:    **[facility] currently reports eGFR in compliance with the current**   recommendations of the [employer] [facility] will   update reporting as new guidelines are published from the NKF-ASN   Task force.    GFR, Est Non African American  Date Value Ref Range Status  06/12/2017 47 (L) > OR = 60 mL/min/1.713mFinal   GFR, Estimated  Date Value Ref Range Status  07/02/2022 31 (L) >60 mL/min Final    Comment:    (NOTE) Calculated using the CKD-EPI Creatinine Equation (2021)    eGFR  Date Value Ref Range Status  07/11/2022 33 (L) >59 mL/min/1.73 Final         Passed - HBA1C is between 0 and 7.9 and within 180 days    Hgb A1c MFr Bld  Date Value Ref Range Status  06/16/2022 7.5 (H) 4.8 - 5.6 % Final    Comment:             Prediabetes: 5.7 - 6.4          Diabetes: >6.4          Glycemic control for adults with diabetes: <7.0          Passed - Valid encounter within  last 6 months    Recent Outpatient Visits           1 week ago Hyperkalemia   [facility] Mins[facility]PA[facility]   3 weeks ago [facility] systolic [facility]  [facility]   1 month ago Type 2 diabetes mellitus with diabetic neuropathy, without long-term current use of insulin ([place]  [facility] MinsLi[place]PA-C   4 months ago Type 2 diabetes mellitus with complication, without long-term current use of insulin ([facility]  [facility] Kir[facility]   7 months ago Medicare annual wellness visit, subsequent   BuPPG IndustriesLiRia CommentPA-C       Future Appointments             In 2 months Drubel, LiRia CommentPA-C BuNewell Rubbermaid[facility] In 2 months Gollan, TiKathlene NovemberMD CoFort [facility]adeCoNew[place]

## 2022-07-24 ENCOUNTER — Ambulatory Visit: Payer: Self-pay

## 2022-07-24 DIAGNOSIS — I5042 Chronic combined systolic (congestive) and diastolic (congestive) heart failure: Secondary | ICD-10-CM

## 2022-07-24 NOTE — Chronic Care Management (AMB) (Signed)
Chronic Care Management   CCM RN Visit Note  07/24/2022 Name: [name] [name] MRN: [id number] DOB: 1940/12/14  Subjective: [name] Sr. is a 81 y.o. year old male who is a primary care patient of [name], [place]. The patient was referred to the Chronic Care Management team for assistance with care management needs subsequent to provider initiation of CCM services and plan of care.    Today's Visit:   Collaboration regarding patient's eligibility for home outreach with community Heart Failure team.        Goals Addressed             This Visit's Progress    Goal: CCM (CHF) Expected Outcome: Monitor, Self-Manage and Reduce Symptoms of Congestive Heart Failure       Current Barriers:  Chronic Disease Management support and education needs related to CHF  Planned Interventions: Discussed current plan for CHF management.  Reviewed medications. Reports being unable to confirm actual doses for medications today, but states family members are assisting with medication management. Discussed importance of taking medications exactly as prescribed. Will follow up within the next week to speak with his sons to confirm doses. Provided information regarding recommended weight parameters. Encouraged to weigh daily and record readings.  Encouraged to notify provider for weight gain greater than 3 lbs overnight or weight gain greater than 5 lbs within a week. Reports weighing occasionally. Encouraged to try and obtain morning weight at least weekly and record reading. Discussed s/sx of fluid overload and indications for notifying a provider. Denies chest pain, palpitations, or shortness of breath. Denies abdominal or lower extremity edema.  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 Reports falling after getting off of his 4 wheeler. Reports experiencing another fall while in his shower. Describes both incidents as "passing out." Reports a cut and  bruise to his arms. Denies head injuries. Declined offer to be evaluated by a provider. Thorough discussion regarding safety measures along with concerns r/t cardiac emergencies d/t patient's cardiac history.  Reports very good support from children and a friend. Agreed to seek immediate medical attention if he experiences another fall or worsening symptoms. Discussed nutritional intake. Advised to closely monitor sodium consumption and avoid highly processed foods when possible. Reviewed worsening s/sx related to CHF exacerbation and indications for seeking immediate medical attention. Agreeable to outreach with the Community Paramedicine/Heart Failure team. Will follow up to arrange routine home visits. Assessed social determinant of health barriers. Update 07/24/22: Follow up care coordination r/t patient's eligibility for outreach from the Community Paramedicine/Heart Failure team. Pending return outreach regarding possible need for a referral. Will follow up within the next two weeks.     Lab Results  Component Value Date   K 5.1 07/11/2022     Wt Readings from Last 3 Encounters:  07/11/22 163 lb (73.9 kg)  07/09/22 165 lb 11.2 oz (75.2 kg)  07/02/22 166 lb 3.6 oz (75.4 kg)     Symptom Management: Take medications as prescribed   Attend all scheduled provider appointments Call pharmacy for medication refills 3-7 days in advance of running out of medications Call office for weight gain more than 3 pounds in one day or 5 pounds in one week Keep legs up while sitting Watch for swelling in feet, ankles and legs every day Use salt in moderation Eat more whole grains, fruits and vegetables, lean meats and healthy fats Follow recommended safety and fall prevention measures Call 911 and seek immediate  medical attention if experiencing chest pain, palpitations or shortness of breath. Call 911 and seek immediate medical attention if another fall r/t "passing out" occurs.   Follow Up  Plan:  Will follow up within the next two weeks            PLAN: Will follow with [name] within the next two weeks.   Katha Cabal RN Care Manager/Chronic Care Management [phone]

## 2022-07-27 DIAGNOSIS — E1169 Type 2 diabetes mellitus with other specified complication: Secondary | ICD-10-CM

## 2022-07-27 DIAGNOSIS — I5042 Chronic combined systolic (congestive) and diastolic (congestive) heart failure: Secondary | ICD-10-CM

## 2022-07-27 DIAGNOSIS — E785 Hyperlipidemia, unspecified: Secondary | ICD-10-CM

## 2022-07-30 ENCOUNTER — Emergency Department (HOSPITAL_COMMUNITY): Payer: Medicare PPO

## 2022-07-30 ENCOUNTER — Emergency Department (HOSPITAL_COMMUNITY)
Admission: EM | Admit: 2022-07-30 | Discharge: 2022-07-30 | Disposition: A | Discharge status: Home / Self Care | Payer: Medicare PPO | Attending: Emergency Medicine | Admitting: Emergency Medicine

## 2022-07-30 ENCOUNTER — Other Ambulatory Visit: Payer: Self-pay

## 2022-07-30 DIAGNOSIS — W19XXXA Unspecified fall, initial encounter: Secondary | ICD-10-CM

## 2022-07-30 DIAGNOSIS — I700 Atherosclerosis of aorta: Secondary | ICD-10-CM | POA: Diagnosis not present

## 2022-07-30 DIAGNOSIS — R0602 Shortness of breath: Secondary | ICD-10-CM | POA: Diagnosis not present

## 2022-07-30 DIAGNOSIS — Y92830 Public park as the place of occurrence of the external cause: Secondary | ICD-10-CM | POA: Insufficient documentation

## 2022-07-30 DIAGNOSIS — Y990 Civilian activity done for income or pay: Secondary | ICD-10-CM | POA: Insufficient documentation

## 2022-07-30 DIAGNOSIS — R55 Syncope and collapse: Secondary | ICD-10-CM

## 2022-07-30 DIAGNOSIS — R001 Bradycardia, unspecified: Secondary | ICD-10-CM | POA: Diagnosis not present

## 2022-07-30 LAB — CBC WITH DIFFERENTIAL/PLATELET
Abs Immature Granulocytes: 0.04 10*3/uL (ref 0.00–0.07)
Basophils Absolute: 0.1 10*3/uL (ref 0.0–0.1)
Basophils Relative: 1 %
Eosinophils Absolute: 0.2 10*3/uL (ref 0.0–0.5)
Eosinophils Relative: 2 %
HCT: 36.7 % — ABNORMAL LOW (ref 39.0–52.0)
Hemoglobin: 12.3 g/dL — ABNORMAL LOW (ref 13.0–17.0)
Immature Granulocytes: 1 %
Lymphocytes Relative: 26 %
Lymphs Abs: 2.3 10*3/uL (ref 0.7–4.0)
MCH: 31.5 pg (ref 26.0–34.0)
MCHC: 33.5 g/dL (ref 30.0–36.0)
MCV: 93.9 fL (ref 80.0–100.0)
Monocytes Absolute: 0.6 10*3/uL (ref 0.1–1.0)
Monocytes Relative: 7 %
Neutro Abs: 5.7 10*3/uL (ref 1.7–7.7)
Neutrophils Relative %: 63 %
Platelets: 237 10*3/uL (ref 150–400)
RBC: 3.91 MIL/uL — ABNORMAL LOW (ref 4.22–5.81)
RDW: 14.8 % (ref 11.5–15.5)
WBC: 8.8 10*3/uL (ref 4.0–10.5)
nRBC: 0 % (ref 0.0–0.2)

## 2022-07-30 LAB — BASIC METABOLIC PANEL
Anion gap: 12 (ref 5–15)
BUN: 21 mg/dL (ref 8–23)
CO2: 21 mmol/L — ABNORMAL LOW (ref 22–32)
Calcium: 8.3 mg/dL — ABNORMAL LOW (ref 8.9–10.3)
Chloride: 97 mmol/L — ABNORMAL LOW (ref 98–111)
Creatinine, Ser: 1.76 mg/dL — ABNORMAL HIGH (ref 0.61–1.24)
GFR, Estimated: 39 mL/min — ABNORMAL LOW (ref >60)
Glucose, Bld: 293 mg/dL — ABNORMAL HIGH (ref 70–99)
Potassium: 4.2 mmol/L (ref 3.5–5.1)
Sodium: 130 mmol/L — ABNORMAL LOW (ref 135–145)

## 2022-07-30 LAB — TROPONIN I (HIGH SENSITIVITY): Troponin I (High Sensitivity): 9 ng/L (ref <18)

## 2022-07-30 LAB — LIPASE, BLOOD: Lipase: 51 U/L (ref 11–51)

## 2022-07-30 NOTE — ED Provider Notes (Signed)
[facility] EMERGENCY DEPARTMENT Provider Note   CSN: [id number] Arrival date & time: 07/30/22  1905     History Chief Complaint  Patient presents with   Fall   Near Syncope    HPI [name] Rosencrans Sr. is a 82 y.o. male presenting for near syncopal episode.  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  Felt like he was going to pass out.  He managed to clock out and then states that he got so lightheaded he had to get on the ground.  He denies falling he denies hitting his head or any injuries.  He denies syncopized but states that he felt too weak to get up.  He denies fevers chills nausea vomiting syncope shortness of breath.  Any other acute injuries.  States he had multiple episodes like this over the past few months.  He was admitted for 1 approximately 2 weeks ago found to be secondary to volume overload.  States has been very compliant on all of his medications and does not feel short of breath or having any fluid overload today. States he has follow-up with a nephrologist tomorrow and like to be discharged but all possible to keep that specialty visit. Endorses bilateral lower extremity weakness earlier in the day that was now completely resolved.   Patient's recorded medical, surgical, social, medication list and allergies were reviewed in the Snapshot window as part of the initial history.   Review of Systems   Review of Systems  Constitutional:  Negative for chills and fever.  HENT:  Negative for ear pain and sore throat.   Eyes:  Negative for pain and visual disturbance.  Respiratory:  Negative for cough and shortness of breath.   Cardiovascular:  Negative for chest pain and palpitations.  Gastrointestinal:  Negative for abdominal pain and vomiting.  Genitourinary:  Negative for dysuria and hematuria.  Musculoskeletal:  Negative for  arthralgias and back pain.  Skin:  Negative for color change and rash.  Neurological:  Positive for dizziness and light-headedness. Negative for seizures and syncope.  All other systems reviewed and are negative.   Physical Exam Updated Vital Signs BP (!) 129/40   Pulse 61   Temp 97.6 F (36.4 C)   Resp 18   SpO2 98%  Physical Exam Vitals and nursing note reviewed.  Constitutional:      General: He is not in acute distress.    Appearance: He is well-developed.  HENT:     Head: Normocephalic and atraumatic.     Ears:     Comments: Anisocoria of approximately 1 mm greater on the left side appreciated today.  Patient believes this is standard for him. Eyes:     Conjunctiva/sclera: Conjunctivae normal.  Cardiovascular:     Rate and Rhythm: Normal rate and regular rhythm.     Heart sounds: No murmur heard. Pulmonary:     Effort: Pulmonary effort is normal. No respiratory distress.     Breath sounds: Normal breath sounds.  Abdominal:     Palpations: Abdomen is soft.     Tenderness: There is no abdominal tenderness.  Musculoskeletal:        General: No swelling.     Cervical back: Neck supple.  Skin:    General: Skin is warm and dry.     Capillary Refill: Capillary refill takes less than 2 seconds.  Neurological:     Mental Status: He is alert.  Psychiatric:        Mood and Affect: Mood normal.      ED Course/ Medical Decision Making/ A&P    Procedures Procedures   Medications Ordered in ED Medications - No data to display Medical Decision Making:   [name] Gross Sr. is a 82 y.o. male who presented to the ED today with a syncopal episode detailed above.    Patient's presentation is complicated by their history of multiple comorbid medical problems.  Patient placed on continuous vitals and telemetry monitoring while in ED which was reviewed periodically.  Complete initial physical exam performed, notably the patient  was HDS in NAD fully asymptomatic.     Reviewed and confirmed nursing documentation for past medical history, family history, social history.    Initial Assessment:   With the patient's presentation of syncope, most likely diagnosis is symptomatic bradycardia likely medication related, now resolved orthostatic hypotension vs vasovagal episode. Other diagnoses were considered including (but not limited to) arrythmogenic syncope, valvular abnormality, PE, aortic dissection. These are considered less likely due to history of present illness and physical exam findings.   This is most consistent with an acute life/limb threatening illness complicated by underlying chronic conditions. Additionally, patient's history appears more consistent with benign episodes including orthostatic remains vagal episode based on description of prodrome of dizziness and description of episodes of near syncope with exertion. ore as she has a baseline low pretest probability for cardiac etiology.  Initial Plan:  Screening labs including CBC and Metabolic panel to evaluate for infectious or metabolic etiology of disease.  Urinalysis with reflex culture ordered to evaluate for UTI or relevant urologic/nephrologic pathology.  CXR to evaluate for structural/infectious intrathoracic pathology.  BNP/troponin/EKG to evaluate for cardiac pathology. Objective evaluation as below reviewed after Telemetry monitoring  Initial Study Results:   Laboratory  All laboratory results reviewed without evidence of clinically relevant pathology.    EKG EKG was reviewed independently. Rate, rhythm, axis, intervals all examined and without medically relevant abnormality. ST segments without concerns for elevations.    Radiology:  All images reviewed independently. Agree with radiology report at this time.   CT HEAD WO CONTRAST (5MM)  Result Date: 07/30/2022 CLINICAL DATA:  Head trauma, minor (Age >= 65y). Near syncopal event. Heart rate in the 30s EXAM: CT HEAD WITHOUT CONTRAST  TECHNIQUE: Contiguous axial images were obtained from the base of the skull through the vertex without intravenous contrast. RADIATION DOSE REDUCTION: This exam was performed according to the departmental dose-optimization program which includes automated exposure control, adjustment of the mA and/or kV according to patient size and/or use of iterative reconstruction technique. COMPARISON:  CT head 12/15/2019 FINDINGS: Brain: Patchy and confluent areas of decreased attenuation are noted throughout the deep and periventricular white matter of the cerebral hemispheres bilaterally, compatible with chronic microvascular ischemic disease. No evidence of large-territorial acute infarction. No parenchymal hemorrhage. No mass lesion. No extra-axial collection. No mass effect or midline shift. No hydrocephalus. Basilar cisterns are patent. Vascular: No hyperdense vessel. Atherosclerotic calcifications are present within the cavernous internal carotid arteries. Skull: No acute fracture or focal lesion. Sinuses/Orbits: Paranasal sinuses and mastoid air cells are clear. Bilateral lens replacement. Otherwise the orbits are unremarkable. Other: None. IMPRESSION: No acute intracranial abnormality. Electronically Signed   By: Iven Finn M.D.   On: 07/30/2022 20:53   DG Chest Portable 1 View  Result Date: 07/30/2022 CLINICAL DATA:  Shortness of breath.  EXAM: PORTABLE CHEST 1 VIEW COMPARISON:  July 02, 2022 FINDINGS: Multiple sternal wires and vascular clips are noted. The cardiac silhouette is mildly enlarged and unchanged in size. There is marked severity calcification of the aortic arch. There is no evidence of acute infiltrate, pleural effusion or pneumothorax. A benign-appearing 4 mm sclerotic focus is seen overlying the seventh right rib. IMPRESSION: 1. Evidence of prior median sternotomy/CABG. 2. No acute cardiopulmonary disease. Electronically Signed   By: Virgina Norfolk M.D.   On: 07/30/2022 19:52   DG Chest  Port 1 View  Result Date: 07/02/2022 CLINICAL DATA:  Dizziness EXAM: PORTABLE CHEST 1 VIEW COMPARISON:  06/16/2022 FINDINGS: Cardiomegaly status post median sternotomy and CABG. Both lungs are clear. The visualized skeletal structures are unremarkable. IMPRESSION: Cardiomegaly without acute abnormality of the lungs in AP portable projection. Electronically Signed   By: Delanna Ahmadi M.D.   On: 07/02/2022 15:22    Final Assessment and Plan:   Patient ambulatory tolerating p.o. intake after prolonged observation in emergency department.  He has had a 3-hour observation and has been approximately 6 hours since his episode of dizziness with no abnormalities appreciated. Had a prolonged conversation with patient at bedside.  Stated that we could offer admission because of his age, evidence of recurrent syncopal episodes.  However he has been admitted before without clear diagnostic benefit and has follow-up with a specialist in the morning regarding his condition. Additionally, he has been asymptomatic for an extended amount of time.  He would like to be discharged tonight to follow-up with his specialist tomorrow and call his regular cardiologist for an outpatient cardiac monitoring to evaluate for episodes of bradycardia which is the most likely suspected diagnosis. Given resolution of symptoms, well appearance, patient's understanding of risks of discharge, I believe he is stable for outpatient care and management with strict return precautions for further care and management.  Disposition:  I have considered need for hospitalization, however, considering all of the above, I believe this patient is stable for discharge at this time.  Patient/family educated about specific return precautions for given chief complaint and symptoms.  Patient/family educated about follow-up with PCP/Cardiology/Nephrology.     Patient/family expressed understanding of return precautions and need for follow-up. Patient spoken  to regarding all imaging and laboratory results and appropriate follow up for these results. All education provided in verbal form with additional information in written form. Time was allowed for answering of patient questions. Patient discharged.    Emergency Department Medication Summary:   Medications - No data to display        Clinical Impression:  1. Fall, initial encounter   2. Syncope, unspecified syncope type      Data Unavailable   Clinical Impression:  1. Fall, initial encounter   2. Syncope, unspecified syncope type      Data Unavailable   Final Clinical Impression(s) / ED Diagnoses Final diagnoses:  Fall, initial encounter  Syncope, unspecified syncope type    Rx / DC Orders ED Discharge Orders     None         Tretha Sciara, MD 07/30/22 2151

## 2022-07-30 NOTE — ED Notes (Signed)
ED Provider at bedside. 

## 2022-07-30 NOTE — ED Triage Notes (Signed)
BIB EMS for a near-syncopal episode during work. Patient on scene had a heart rate in 30s. Patient reports was clocking out when his legs gave out suddenly. Denies n/t, -LOC or hurting head. Takes a daily ASA. Pt only cc of unable to lift legs.   Pupils are unequal during time of triage.

## 2022-07-30 NOTE — ED Notes (Signed)
Patient transported to CT 

## 2022-07-31 DIAGNOSIS — K219 Gastro-esophageal reflux disease without esophagitis: Secondary | ICD-10-CM | POA: Diagnosis not present

## 2022-07-31 DIAGNOSIS — N400 Enlarged prostate without lower urinary tract symptoms: Secondary | ICD-10-CM | POA: Diagnosis not present

## 2022-07-31 DIAGNOSIS — I10 Essential (primary) hypertension: Secondary | ICD-10-CM | POA: Diagnosis not present

## 2022-07-31 DIAGNOSIS — I509 Heart failure, unspecified: Secondary | ICD-10-CM | POA: Diagnosis not present

## 2022-07-31 DIAGNOSIS — E1122 Type 2 diabetes mellitus with diabetic chronic kidney disease: Secondary | ICD-10-CM | POA: Diagnosis not present

## 2022-07-31 DIAGNOSIS — R8290 Unspecified abnormal findings in urine: Secondary | ICD-10-CM | POA: Diagnosis not present

## 2022-07-31 DIAGNOSIS — E785 Hyperlipidemia, unspecified: Secondary | ICD-10-CM | POA: Diagnosis not present

## 2022-07-31 DIAGNOSIS — M1000 Idiopathic gout, unspecified site: Secondary | ICD-10-CM | POA: Diagnosis not present

## 2022-07-31 DIAGNOSIS — D631 Anemia in chronic kidney disease: Secondary | ICD-10-CM | POA: Diagnosis not present

## 2022-08-01 ENCOUNTER — Telehealth: Payer: Self-pay

## 2022-08-01 ENCOUNTER — Other Ambulatory Visit: Payer: Self-pay | Admitting: Nephrology

## 2022-08-01 DIAGNOSIS — D631 Anemia in chronic kidney disease: Secondary | ICD-10-CM

## 2022-08-01 DIAGNOSIS — R8290 Unspecified abnormal findings in urine: Secondary | ICD-10-CM

## 2022-08-01 DIAGNOSIS — E785 Hyperlipidemia, unspecified: Secondary | ICD-10-CM

## 2022-08-01 DIAGNOSIS — E1122 Type 2 diabetes mellitus with diabetic chronic kidney disease: Secondary | ICD-10-CM

## 2022-08-01 NOTE — Telephone Encounter (Signed)
        Patient  visited [place] on 1/3     Telephone encounter attempt :  1st  A HIPAA compliant voice message was left requesting a return call.  Instructed patient to call back .    Apollos Tenbrink [facility] Health Care Guide, , Care Management  [phone] [address], [place], [place] [address] Phone: [phone] Email: [email or web address]       

## 2022-08-03 NOTE — Progress Notes (Unsigned)
Cardiology Office Note  Date:  08/04/2022   ID:  [name] Alpha Sr., DOB 14-Mar-1941, MRN [id number]  PCP:  Alfredia Ferguson, [name]   Chief Complaint  Patient presents with   3 month follow up     Patient c/o dizziness, syncopal spell 07/30/2022; was treated at [facility]. Medications reviewed by the patient verbally.      HPI:  [name] is a very pleasant 82 year old gentleman with  coronary artery disease,  bypass surgery in 2004,  occluded graft to the diagonal branch with 3 other patent grafts to the RCA, OM with a LIMA to the LAD by catheterization in 2007  Chronic renal insufficiency who presents for routine followup Of his coronary artery disease .   Last seen in clinic 4/23 Seen by one of our providers July 11, 2022  saw Dr. Eldridge Dace 02/2022 and CTO PCI was felt be high risk, medical management recommended   admitted 05/2022 with heart failure.  Echo showed LVEF 45-50%, RV mildly reduced.  diuresed 8L w/ IV lasix.  GDMT Was limited by CKD. Jardiance was added. D/c weight was 166lbs.   He saw Advanced Heart failure 07/02/22 and was overall stable.   He was admitted later that day on 12/6 with hyperkalemia with K>7.5. He was taking potassium BID instead of daily.Klor-con was stopped. He was also treated for AKI with Scr up to 2.2. Metformin was stopped for AKI.  Seen in the emergency room July 30, 2022, near syncope Denies loss of consciousness, was too weak to get up off the ground Was told at the scene by EMTs his blood pressure and heart rate were too low Multiple episodes like this over the past few months, near syncope/syncope Blood pressure 129/40 heart rate 61 in the ER Creatinine 1.76 sodium 130  On lasix 40 in AM and 20 mg PM Having near syncope recently,more than one epsiode  Orthostatics positive today supine 135/69 down to 108/67 sitting, 107/59 standing, minimal recovery after 3 minutes heart rate from 67 up to 82  Denies having chest  pain concerning for angina  EKG personally reviewed by myself on todays visit Normal sinus rhythm rate 69 bpm PACs right bundle branch block  Other past medical history reviewed Cardiac catheterization 12/22/2021 for angina Multivessel coronary artery disease including severe diffuse LAD and LCx disease.  There is also a short, chronic total occlusion of the mid RCA with bridging and left-right collaterals. Widely patent LIMA-LAD and sequential SVG-OM1-OM2. Chronically occluded SVG-D1. Normal left ventricular contraction (LVEF 55-65%) with normal filling pressure (LVEDP 10 mmHg). Unsuccessful attempted PCI to mid RCA due to inability to cross the lesion using Runthrough and Fielder XT wires (including with balloon support).   Medication changes made: isosorbide mononitrate to 120 mg daily.    emergency room after syncope episode Dec 15, 2019 [place] outside [place] the farm, felt sugar was low, got clammy and lightheaded went into the house checked his sugar it was 43, ate a sandwich, [facility], started feeling better 1 hour later began lightheaded and clammy, had brief syncopal episode lasting several seconds, witnessed by his son  creatinine 1.46 hemoglobin 11 glucose 268  Echocardiogram February 01, 2020 ejection fraction 50 to 55%, Grade 2 diastolic dysfunction, right heart pressures 45 mmHg, hypokinesis basal inferoseptal wall  Stress test August 02, 2019 Ischemia in the basal and mid inferior and inferoseptal myocardium   PMH:   has a past medical history of Arthritis, Back pain, CAD (coronary artery disease), CKD (  chronic kidney disease), stage II-III, Colon polyps, DM2 (diabetes mellitus, type 2) ([place]), GERD (gastroesophageal reflux disease), Glaucoma, Gout, HLD (hyperlipidemia), HTN (hypertension), and Neuropathy.  PSH:    Past Surgical History:  Procedure Laterality Date   BACK SURGERY     Cyst removed   CORONARY ARTERY BYPASS GRAFT  2004   CORONARY STENT INTERVENTION N/A  09/24/2021   Procedure: CORONARY STENT INTERVENTION;  Surgeon: [name], MD;  Location: [place];  Service: Cardiovascular;  Laterality: N/A;   KNEE SURGERY     LEFT HEART CATH AND CORONARY ANGIOGRAPHY Left 09/24/2021   Procedure: LEFT HEART CATH AND CORONARY ANGIOGRAPHY;  Surgeon: [name], MD;  Location: [place];  Service: Cardiovascular;  Laterality: Left;    Current Outpatient Medications  Medication Sig Dispense Refill   ACCU-CHEK AVIVA PLUS test strip USE AS DIRECTED TO CHECK FASTING BLOOD SUGAR EVERY MORNING 100 each 4   allopurinol (ZYLOPRIM) 300 MG tablet TAKE 1 TABLET BY MOUTH ONCE DAILY 90 tablet 0   aspirin 81 MG chewable tablet Chew 81 mg by mouth daily.     atorvastatin (LIPITOR) 20 MG tablet TAKE 1 TABLET BY MOUTH ONCE DAILY 90 tablet 1   BD SHARPS CONTAINER HOME MISC Dispose of syringes after B12 injections 1 each 5   Blood Glucose Monitoring Suppl (ACCU-CHEK AVIVA PLUS) w/Device KIT To check blood sugar once daily 1 kit 0   carvedilol (COREG) 3.125 MG tablet Take 1 tablet (3.125 mg total) by mouth 2 (two) times daily. 180 tablet 1   cetirizine (ZYRTEC) 10 MG tablet Take 10 mg by mouth daily. Take 1 tablet daily as needed for allergies     gabapentin (NEURONTIN) 300 MG capsule Take 1 capsule (300 mg total) by mouth 2 (two) times daily. 60 capsule 0   glipiZIDE (GLUCOTROL XL) 10 MG 24 hr tablet Take 1 tablet (10 mg total) by mouth daily with breakfast. 90 tablet 3   JANUVIA 100 MG tablet TAKE 1 TABLET BY MOUTH ONCE DAILY 90 tablet 1   JARDIANCE 10 MG TABS tablet TAKE 1 TABLET BY MOUTH ONCE DAILY 30 tablet 0   nitroGLYCERIN (NITROSTAT) 0.4 MG SL tablet Place 1 tablet (0.4 mg total) under the tongue every 5 (five) minutes as needed for chest pain. 25 tablet 3   ranolazine (RANEXA) 1000 MG SR tablet Take 1 tablet (1,000 mg total) by mouth 2 (two) times daily. 180 tablet 1   tamsulosin (FLOMAX) 0.4 MG CAPS capsule Take 1 capsule (0.4 mg total)  by mouth daily. 90 capsule 1   furosemide (LASIX) 20 MG tablet Take 40 mg every morning and take 20 mg in evening if needed 120 tablet 0   SYRINGE-NEEDLE, DISP, 3 ML (B-D SYRINGE/NEEDLE 3CC/25GX5/8) 25G X 5/8" 3 ML MISC For use with cyanocobalamin injections (Patient not taking: Reported on 08/04/2022) 50 each 0   Current Facility-Administered Medications  Medication Dose Route Frequency Provider Last Rate Last Admin   sodium chloride flush (NS) 0.9 % injection 3 mL  3 mL Intravenous Q12H Furth, Cadence H, [name]         Allergies:   Ramipril   Social History:  The patient  reports that he has never smoked. His smokeless tobacco use includes chew. He reports that he does not drink alcohol and does not use drugs.   Family History:   family history includes Stroke in his mother.    Review of Systems: Review of Systems  Constitutional: Negative.  HENT: Negative.    Respiratory: Negative.    Cardiovascular:  Positive for chest pain.  Gastrointestinal: Negative.   Musculoskeletal: Negative.   Neurological: Negative.   Psychiatric/Behavioral: Negative.    All other systems reviewed and are negative.   PHYSICAL EXAM: VS:  BP 130/60 (BP Location: Left Arm, Patient Position: Sitting, Cuff Size: Normal)   Pulse 69   Ht 5\' 10"  (1.778 m)   Wt 163 lb (73.9 kg)   SpO2 98%   BMI 23.39 kg/m  , BMI Body mass index is 23.39 kg/m.  Constitutional:  oriented to person, place, and time. No distress.  HENT:  Head: Grossly normal Eyes:  no discharge. No scleral icterus.  Neck: No JVD, no carotid bruits  Cardiovascular: Regular rate and rhythm, no murmurs appreciated Pulmonary/Chest: Clear to auscultation bilaterally, no wheezes or rails Abdominal: Soft.  no distension.  no tenderness.  Musculoskeletal: Normal range of motion Neurological:  normal muscle tone. Coordination normal. No atrophy Skin: Skin warm and dry Psychiatric: normal affect, pleasant  Recent Labs: 06/16/2022: ALT  52 07/02/2022: B Natriuretic Peptide 121.5; Magnesium 2.1 07/30/2022: BUN 21; Creatinine, Ser 1.76; Hemoglobin 12.3; Platelets 237; Potassium 4.2; Sodium 130    Lipid Panel Lab Results  Component Value Date   CHOL 114 05/16/2021   HDL 35 (L) 05/16/2021   LDLCALC 59 05/16/2021   TRIG 104 05/16/2021    Wt Readings from Last 3 Encounters:  08/04/22 163 lb (73.9 kg)  07/11/22 163 lb (73.9 kg)  07/09/22 165 lb 11.2 oz (75.2 kg)      ASSESSMENT AND PLAN:  CAD with stable angina catheterization February 2023 with severe three-vessel coronary disease Unsuccessful attempt RCA lesion Seen by CTO clinic in [place], medical management recommended On today's visit denies unstable angina symptoms Cholesterol at goal  Near syncope/syncope Orthostatics positive today, recommended he decrease carvedilol down to 3.125 twice daily Recommend he hold the evening Lasix and continue 40 daily with Lasix in the evening only for ankle swelling or abdominal bloating Weight today 3 pounds below his prior dry weight  essential hypertension - Plan: EKG 12-Lead Plan as above with decrease to carvedilol and decrease on the Lasix  Type 2 diabetes mellitus with complication, without long-term current use of insulin (HCC) A1c 7.5, managed by primary care  Hyperlipidemia Cholesterol is at goal on the current lipid regimen. No changes to the medications were made.  Extensive review of recent records, hospitalizations, long discussion concerning medical management  Total encounter time more than 40 minutes  Greater than 50% was spent in counseling and coordination of care with the patient   Orders Placed This Encounter  Procedures   EKG 12-Lead     Signed, [name], M.D., Ph.D. 08/04/2022  [facility] [place], [place]

## 2022-08-04 ENCOUNTER — Ambulatory Visit: Payer: Medicare PPO | Attending: Cardiovascular Disease | Admitting: Cardiovascular Disease

## 2022-08-04 ENCOUNTER — Telehealth: Payer: Self-pay

## 2022-08-04 ENCOUNTER — Encounter: Payer: Self-pay | Admitting: Cardiovascular Disease

## 2022-08-04 VITALS — BP 130/60 | HR 69 | Ht 70.0 in | Wt 163.0 lb

## 2022-08-04 DIAGNOSIS — E782 Mixed hyperlipidemia: Secondary | ICD-10-CM | POA: Diagnosis not present

## 2022-08-04 DIAGNOSIS — N183 Chronic kidney disease, stage 3 unspecified: Secondary | ICD-10-CM | POA: Diagnosis not present

## 2022-08-04 DIAGNOSIS — I5022 Chronic systolic (congestive) heart failure: Secondary | ICD-10-CM | POA: Diagnosis not present

## 2022-08-04 DIAGNOSIS — E785 Hyperlipidemia, unspecified: Secondary | ICD-10-CM

## 2022-08-04 DIAGNOSIS — E119 Type 2 diabetes mellitus without complications: Secondary | ICD-10-CM

## 2022-08-04 DIAGNOSIS — G4733 Obstructive sleep apnea (adult) (pediatric): Secondary | ICD-10-CM

## 2022-08-04 DIAGNOSIS — I25118 Atherosclerotic heart disease of native coronary artery with other forms of angina pectoris: Secondary | ICD-10-CM | POA: Diagnosis not present

## 2022-08-04 DIAGNOSIS — I10 Essential (primary) hypertension: Secondary | ICD-10-CM

## 2022-08-04 DIAGNOSIS — E1169 Type 2 diabetes mellitus with other specified complication: Secondary | ICD-10-CM

## 2022-08-04 MED ORDER — CARVEDILOL 3.125 MG PO TABS: 3.1250 mg | ORAL_TABLET | Freq: Two times a day (BID) | ORAL | 1 refills | Status: DC

## 2022-08-04 MED ORDER — FUROSEMIDE 20 MG PO TABS: ORAL_TABLET | ORAL | 0 refills | Status: DC

## 2022-08-04 NOTE — Telephone Encounter (Signed)
        Patient  visited [place] on 1/3  Telephone encounter attempt :  2nd  A HIPAA compliant voice message was left requesting a return call.  Instructed patient to call back .    Zlatan Hornback Pop Health Care Guide, [place], Care Management  [phone] [address], [place], [place] [address] Phone: [phone] Email: [email or web address]        

## 2022-08-04 NOTE — Patient Instructions (Addendum)
Medication Instructions:  Please decrease the coreg down to 3.125 mg twice a day  Continue lasix 40 mg in the Am Only take the evening lasix/furosemide as needed for ankle swelling, ABD bloating, weight gain, signs of fluid  If you need a refill on your cardiac medications before your next appointment, please call your pharmacy.   Lab work: No new labs needed  Testing/Procedures: No new testing needed  Follow-Up: At [facility], you and your health needs are our priority.  As part of our continuing mission to provide you with exceptional heart care, we have created designated Provider Care Teams.  These Care Teams include your primary Cardiologist (physician) and Advanced Practice Providers (APPs -  Physician Assistants and Nurse Practitioners) who all work together to provide you with the care you need, when you need it.  You will need a follow up appointment in 6 months  Providers on your designated Care Team:   Murray Hodgkins, NP Christell Faith, [name] Cadence Kathlen Mody, [place]  COVID-19 Vaccine Information can be found at: [email or web address] For questions related to vaccine distribution or appointments, please email [email or web address] or call [phone].

## 2022-08-13 ENCOUNTER — Other Ambulatory Visit: Payer: Self-pay | Admitting: Cardiovascular Disease

## 2022-08-13 ENCOUNTER — Other Ambulatory Visit: Payer: Self-pay | Admitting: Physician Assistant

## 2022-08-13 DIAGNOSIS — E118 Type 2 diabetes mellitus with unspecified complications: Secondary | ICD-10-CM

## 2022-08-13 DIAGNOSIS — G629 Polyneuropathy, unspecified: Secondary | ICD-10-CM

## 2022-09-08 NOTE — Progress Notes (Unsigned)
Cardiology Office Note:    Date:  09/09/2022   ID:  [name] Gross Sr., DOB [birth year], MRN [id number]  PCP:  [name], [name]  [facility] Cardiologist:  [name], MD  [place] Electrophysiologist:  None   Referring MD: [name], [name]   Chief Complaint: 1 month follow-up  History of Present Illness:    [name] Lower Vittorio Sr. is a 82 y.o. male with a hx of chronic diastolic heart failure, CAD s/p CABG in 2004, HTN, HLD, DM2, CKD stage 3 who is being seen for 1 month follow-up.   H/o CAD s/p CABG x4 in 2004 with subsequent catheterization in 2007 revealing 3 and 4 patent grafts (occluded vein graft to the diagonal). Recent medication changes include addition of Cialis and discontinuation of Imdur (given interaction between these two mediations). Coreg has also been discontinued with patient previously on Coreg 6.30m BID but no longer taking for unclear reasons at this time.    Myoview 07/2019 was abnormal, high risk test with possible ischemia, LVEF 43%.  Echo 07/2019 showed LVEF 50-55%, G2DD, WMA, mildly dilated LA, moderately elevated pulmonary artery pressure.      Seen 09/06/21 and reported chest pain and was having exertional chest pain. A myoview lexiscan was ordered, this showed ischemia in the inferior wall with perfusion defect, moderate reversible in the inferior wall and apical region, EF 43%, moderate risk scan. Patient was brought in to discuss cardiac catheterization. He was seen 09/19/21 and was set up for outpatient cardiac catheterization.    Cath showed multivessel coronary artery disease including severe diffuse LAD and left circumflex disease.  There is also a short, chronic total occlusion of the mid RCA with bridging with left-to-right collaterals.  Widely patent LIMA to LAD and sequential SVG to OM to OM 2.  Chronically occluded SVG to D1.  Normal LVEF with normal filling pressures.  Unsuccessful attempted PCI to mid RCA due to  inability to cross the lesion using run-through and Fielder XT wires.  Recommendation for escalation of antianginal therapy, Imdur was increased to 120 mg daily.  If the patient has refractory lifestyle limiting symptoms may need referral to CTO team. Appointment was a no-show.    He saw Dr. [name] and CTO PCI was felt be high risk, and activity modification was recommended.    He was admitted 05/2022 with heart failure. Echo showed LVEF 45-50%, RV mildly reduced. He was diuresed 8L w/ IV lasix. He was transitioned to oral lasix. GDMT Was limited by CKD. Jardiance was added. D/c weight was 166lbs.   He saw Advanced Heart failure 07/02/22 and was overall stable.   He was admitted later that day on 12/6 with hyperkalemia with K>7.5. He was taking potassium 280m BID instead of 2028mdaily.Klor-con was stopped. He was also treated for AKI with Scr up to 2.2. Metformin was stopped for AKI.  Last seen 07/2021 and reported dizziness. Orthostatics were negative. He decreased Coreg and lasix pm dose was stopped. He is taking lasix 100m43mily.   Today, the patient reports dizziness and falls. Says it started in November. Before he falls he feels dizzy and blurry vision. No chest pain, SOB. Says he lost consciousness a couple times. He has sustained injuries on the right arm, has not hit his head. Also has some leg weakness. Orthostatics positive lying to sitting.   Past Medical History:  Diagnosis Date   Arthritis    Back pain    hx   CAD (coronary  artery disease)    a. 2004 s/p CABG x 4 (LIMA->LAD, VG->Diag, VG->OM, VG->RCA); b. 2007 Cath: VG->Diag occluded, otw 3/4 patent grafts.   CKD (chronic kidney disease), stage II-III    Colon polyps    DM2 (diabetes mellitus, type 2) ([place])    stable   GERD (gastroesophageal reflux disease)    Glaucoma    Gout    HLD (hyperlipidemia)    HTN (hypertension)    Neuropathy     Past Surgical History:  Procedure Laterality Date   BACK SURGERY      Cyst removed   CORONARY ARTERY BYPASS GRAFT  2004   CORONARY STENT INTERVENTION N/A 09/24/2021   Procedure: CORONARY STENT INTERVENTION;  Surgeon: [name], MD;  Location: [place] [facility];  Service: Cardiovascular;  Laterality: N/A;   KNEE SURGERY     LEFT HEART CATH AND CORONARY ANGIOGRAPHY Left 09/24/2021   Procedure: LEFT HEART CATH AND CORONARY ANGIOGRAPHY;  Surgeon: [name], MD;  Location: [place] [facility];  Service: Cardiovascular;  Laterality: Left;    Current Medications: Current Meds  Medication Sig   ACCU-CHEK AVIVA PLUS test strip USE AS DIRECTED TO CHECK FASTING BLOOD SUGAR EVERY MORNING   allopurinol (ZYLOPRIM) 300 MG tablet TAKE 1 TABLET BY MOUTH ONCE DAILY   aspirin 81 MG chewable tablet Chew 81 mg by mouth daily.   atorvastatin (LIPITOR) 20 MG tablet TAKE 1 TABLET BY MOUTH ONCE DAILY   BD SHARPS CONTAINER HOME MISC Dispose of syringes after B12 injections   Blood Glucose Monitoring Suppl (ACCU-CHEK AVIVA PLUS) w/Device KIT To check blood sugar once daily   carvedilol (COREG) 3.125 MG tablet Take 1 tablet (3.125 mg total) by mouth 2 (two) times daily.   cetirizine (ZYRTEC) 10 MG tablet Take 10 mg by mouth daily. Take 1 tablet daily as needed for allergies   empagliflozin (JARDIANCE) 10 MG TABS tablet TAKE 1 TABLET BY MOUTH ONCE DAILY   furosemide (LASIX) 20 MG tablet Take 40 mg every morning and take 20 mg in evening if needed   gabapentin (NEURONTIN) 300 MG capsule TAKE 1 CAPSULE BY MOUTH TWICE DAILY   glipiZIDE (GLUCOTROL XL) 10 MG 24 hr tablet Take 1 tablet (10 mg total) by mouth daily with breakfast.   JANUVIA 100 MG tablet TAKE 1 TABLET BY MOUTH ONCE DAILY   nitroGLYCERIN (NITROSTAT) 0.4 MG SL tablet Place 1 tablet (0.4 mg total) under the tongue every 5 (five) minutes as needed for chest pain.   ranolazine (RANEXA) 1000 MG SR tablet Take 1 tablet (1,000 mg total) by mouth 2 (two) times daily.   tamsulosin (FLOMAX) 0.4 MG CAPS capsule Take 1  capsule (0.4 mg total) by mouth daily.   Current Facility-Administered Medications for the 09/09/22 encounter (Office Visit) with Kathlen Mody, Batsheva Stevick H, [name]  Medication   sodium chloride flush (NS) 0.9 % injection 3 mL     Allergies:   Ramipril   Social History   Socioeconomic History   Marital status: Widowed    Spouse name: Not on file   Number of children: 3   Years of education: Not on file   Highest education level: 8th grade  Occupational History   Occupation: Retired  Tobacco Use   Smoking status: Never   Smokeless tobacco: Current    Types: Chew   Tobacco comments:    chew tobacco - 1 pack last 3 weeks  Vaping Use   Vaping Use: Never used  Substance and Sexual Activity  Alcohol use: No    Alcohol/week: 0.0 standard drinks of alcohol   Drug use: No   Sexual activity: Not Currently  Other Topics Concern   Not on file  Social History Narrative   Not on file   Social Determinants of Health   Financial Resource Strain: Low Risk  (07/18/2022)   Overall Financial Resource Strain (CARDIA)    Difficulty of Paying Living Expenses: Not very hard  Food Insecurity: No Food Insecurity (07/18/2022)   Hunger Vital Sign    Worried About Running Out of Food in the Last Year: Never true    Ran Out of Food in the Last Year: Never true  Transportation Needs: No Transportation Needs (07/18/2022)   PRAPARE - Hydrologist (Medical): No    Lack of Transportation (Non-Medical): No  Physical Activity: Not on file  Stress: No Stress Concern Present (07/18/2022)   [place]    Feeling of Stress : Not at all  Social Connections: Unknown (07/18/2022)   Social Connection and Isolation Panel [NHANES]    Frequency of Communication with Friends and Family: More than three times a week    Frequency of Social Gatherings with Friends and Family: More than three times a week    Attends Religious  Services: Not on file    Active Member of Clubs or Organizations: Not on file    Attends Archivist Meetings: Not on file    Marital Status: Widowed     Family History: The patient's family history includes Stroke in his mother.  ROS:   Please see the history of present illness.     All other systems reviewed and are negative.  EKGs/Labs/Other Studies Reviewed:    The following studies were reviewed today:  Echo 05/2022   1. Left ventricular ejection fraction, by estimation, is 45 to 50%. The  left ventricle has mildly decreased function. The left ventricle  demonstrates global hypokinesis. The left ventricular internal cavity size  was mildly dilated. Left ventricular  diastolic parameters are consistent with Grade I diastolic dysfunction  (impaired relaxation).   2. Right ventricular systolic function is mildly reduced. The right  ventricular size is normal. There is normal pulmonary artery systolic  pressure.   3. Left atrial size was mildly dilated.   4. The mitral valve is grossly normal. No evidence of mitral valve  regurgitation. No evidence of mitral stenosis.   5. The aortic valve is tricuspid. There is moderate calcification of the  aortic valve. Aortic valve regurgitation is not visualized. Aortic valve  sclerosis/calcification is present, without any evidence of aortic  stenosis.   6. The inferior vena cava is dilated in size with >50% respiratory  variability, suggesting right atrial pressure of 8 mmHg.   Comparison(s): Prior images reviewed side by side. LV is slightly dilated  from prior.    Echo 01/2022 1. Left ventricular ejection fraction, by estimation, is 50 to 55%. The  left ventricle has low normal function. The left ventricle has no regional  wall motion abnormalities. There is mild left ventricular hypertrophy.  Left ventricular diastolic  parameters are consistent with Grade III diastolic dysfunction  (restrictive). Elevated left atrial  pressure. The average left ventricular  global longitudinal strain is -12.3 %. The global longitudinal strain is  abnormal.   2. Right ventricular systolic function is mildly reduced. The right  ventricular size is normal. There is moderately elevated pulmonary artery  systolic  pressure. The estimated right ventricular systolic pressure is  123456 mmHg.   3. Left atrial size was moderately dilated.   4. Right atrial size was moderately dilated.   5. The mitral valve is normal in structure. Mild mitral valve  regurgitation. No evidence of mitral stenosis.   6. Tricuspid valve regurgitation is mild to moderate.   7. The aortic valve is tricuspid. There is mild calcification of the  aortic valve. There is mild thickening of the aortic valve. Aortic valve  regurgitation is not visualized. Aortic valve sclerosis/calcification is  present, without any evidence of  aortic stenosis.   8. Aortic dilatation noted. There is borderline dilatation of the aortic  root, measuring 39 mm. There is borderline dilatation of the aortic arch,  measuring 31 mm.   9. The inferior vena cava is dilated in size with <50% respiratory  variability, suggesting right atrial pressure of 15 mmHg.   Comparison(s): EF 50%, hypokinesis of the basal septal, anteroseptal,  inferior/inferoseptal regions, RVSP 44.70mHg.      Cardiac cath 09/24/21   Conclusions: Multivessel coronary artery disease including severe diffuse LAD and LCx disease.  There is also a short, chronic total occlusion of the mid RCA with bridging and left-right collaterals. Widely patent LIMA-LAD and sequential SVG-OM1-OM2. Chronically occluded SVG-D1. Normal left ventricular contraction (LVEF 55-65%) with normal filling pressure (LVEDP 10 mmHg). Unsuccessful attempted PCI to mid RCA due to inability to cross the lesion using Runthrough and Fielder XT wires (including with balloon support).   Recommendations: Escalate antianginal therapy.  I will  increase isosorbide mononitrate to 120 mg daily.  If the patient has refractory lifestyle-limiting angina despite maximal tolerated antianginal therapy, referral to our CTO team for possible repeat intervention to the mid RCA could be considered. Aggressive secondary prevention of coronary artery disease.   CNelva Bush MD [facility]   Antiplatelet/Anticoag Continue aspirin 81 mg daily.  Defer adding P2Y12 inhibitor in the setting of unsuccessful PCI today.  Discharge Date In the absence of any other complications or medical issues, we expect the patient to be ready for discharge from an interventional cardiology perspective on 09/24/2021.      Coronary Diagrams   Diagnostic Dominance: Right Intervention       EKG:  EKG is ordered today.  The ekg ordered today demonstrates NSR, RBBB, LPFB, 60bpm, nonspecific ST/T wave changes  Recent Labs: 06/16/2022: ALT 52 07/02/2022: B Natriuretic Peptide 121.5; Magnesium 2.1 07/30/2022: BUN 21; Creatinine, Ser 1.76; Hemoglobin 12.3; Platelets 237; Potassium 4.2; Sodium 130  Recent Lipid Panel    Component Value Date/Time   CHOL 114 05/16/2021 0923   TRIG 104 05/16/2021 0923   TRIG 136 02/28/2008 0000   HDL 35 (L) 05/16/2021 0923   CHOLHDL 3.3 05/16/2021 0923   LDLCALC 59 05/16/2021 0923     Physical Exam:    VS:  BP 121/64   Pulse 60   Ht 5' 8"$  (1.727 m)   Wt 165 lb (74.8 kg)   SpO2 100%   BMI 25.09 kg/m     Wt Readings from Last 3 Encounters:  09/09/22 165 lb (74.8 kg)  08/04/22 163 lb (73.9 kg)  07/11/22 163 lb (73.9 kg)     GEN:  Well nourished, well developed in no acute distress HEENT: Normal NECK: No JVD; No carotid bruits LYMPHATICS: No lymphadenopathy CARDIAC: RRR, no murmurs, rubs, gallops RESPIRATORY:  Clear to auscultation without rales, wheezing or rhonchi  ABDOMEN: Soft, non-tender, non-distended MUSCULOSKELETAL:  No edema; No deformity  SKIN: Warm and dry NEUROLOGIC:  Alert and oriented x  3 PSYCHIATRIC:  Normal affect   ASSESSMENT:    1. Dizziness   2. Orthostatic hypotension    PLAN:    In order of problems listed above:  Dizziness Orthostatics hypotension At the prior visit patient reported dizziness and orthostatics were positive. Lasix and Coreg decreased with mild improvement in symptoms. He reports he is still dizzy and is falling. Orthostatics positive from lying to sitting. EKG shows NSR with no significant changes. I will order a 2 week heart monitor as well as a carotid US. I will stop Coreg and decrease lasix to 80m daily. We will see him back in 2 months.   HFrEF The patient is euvolemic on exam. Stop Coreg as above. Decreased lasix as above.   CAD The patient denies chest pain. He has known severe 3V CAD. He saw Dr. VIrish Lackto discuss CTO treatment, however he was deemed high riak and activity modification was recommended. Continue ASA, statin and Ranexa.   CKD stage 3 Most recent labs with stable numbers  Disposition: Follow up in 2 month(s) with MD/APP    Signed, Nylee Barbuto HNinfa Meeker [name]  09/09/2022 10:38 AM    [place]

## 2022-09-09 ENCOUNTER — Other Ambulatory Visit: Payer: Self-pay | Admitting: Medical

## 2022-09-09 ENCOUNTER — Ambulatory Visit: Payer: Medicare PPO | Attending: Medical | Admitting: Medical

## 2022-09-09 ENCOUNTER — Encounter: Payer: Self-pay | Admitting: Medical

## 2022-09-09 ENCOUNTER — Ambulatory Visit (INDEPENDENT_AMBULATORY_CARE_PROVIDER_SITE_OTHER): Payer: Medicare PPO

## 2022-09-09 ENCOUNTER — Other Ambulatory Visit: Payer: Self-pay | Admitting: Physician Assistant

## 2022-09-09 VITALS — BP 121/64 | HR 60 | Ht 68.0 in | Wt 165.0 lb

## 2022-09-09 DIAGNOSIS — I951 Orthostatic hypotension: Secondary | ICD-10-CM

## 2022-09-09 DIAGNOSIS — G629 Polyneuropathy, unspecified: Secondary | ICD-10-CM

## 2022-09-09 DIAGNOSIS — R42 Dizziness and giddiness: Secondary | ICD-10-CM

## 2022-09-09 DIAGNOSIS — N401 Enlarged prostate with lower urinary tract symptoms: Secondary | ICD-10-CM

## 2022-09-09 MED ORDER — FUROSEMIDE 20 MG PO TABS: 20.0000 mg | ORAL_TABLET | Freq: Every day | ORAL | 2 refills | Status: DC

## 2022-09-09 NOTE — Patient Instructions (Signed)
Medication Instructions:   Your physician has recommended you make the following change in your medication:   Decrease Furosemide 20 mg tablet by mouth once daily.  STOP Carvedilol ( Coreg)  *If you need a refill on your cardiac medications before your next appointment, please call your pharmacy*   Lab Work:  NONE  If you have labs (blood work) drawn today and your tests are completely normal, you will receive your results only by: [place] (if you have MyChart) OR A paper copy in the mail If you have any lab test that is abnormal or we need to change your treatment, we will call you to review the results.   Testing/Procedures:  Your physician has requested that you have a carotid duplex. This test is an ultrasound of the carotid arteries in your neck. It looks at blood flow through these arteries that supply the brain with blood. Allow one hour for this exam. There are no restrictions or special instructions.   Your physician has recommended that you wear a Zio monitor for 14 days.  This monitor is a medical device that records the heart's electrical activity. Doctors most often use these monitors to diagnose arrhythmias. Arrhythmias are problems with the speed or rhythm of the heartbeat. The monitor is a small device applied to your chest. You can wear one while you do your normal daily activities. While wearing this monitor if you have any symptoms to push the button and record what you felt. Once you have worn this monitor for the period of time provider prescribed (Usually 14 days), you will return the monitor device in the postage paid box. Once it is returned they will download the data collected and provide [place] with a report which the provider will then review and we will call you with those results. Important tips:  Avoid showering during the first 24 hours of wearing the monitor. Avoid excessive sweating to help maximize wear time. Do not submerge the device, no hot  tubs, and no swimming pools. Keep any lotions or oils away from the patch. After 24 hours you may shower with the patch on. Take brief showers with your back facing the shower head.  Do not remove patch once it has been placed because that will interrupt data and decrease adhesive wear time. Push the button when you have any symptoms and write down what you were feeling. Once you have completed wearing your monitor, remove and place into box which has postage paid and place in your outgoing mailbox.  If for some reason you have misplaced your box then call our office and we can provide another box and/or mail it off for you.      Follow-Up: At [facility], you and your health needs are our priority.  As part of our continuing mission to provide you with exceptional heart care, we have created designated Provider Care Teams.  These Care Teams include your primary Cardiologist (physician) and Advanced Practice Providers (APPs -  Physician Assistants and Nurse Practitioners) who all work together to provide you with the care you need, when you need it.  We recommend signing up for the patient portal called "MyChart".  Sign up information is provided on this After Visit Summary.  MyChart is used to connect with patients for Virtual Visits (Telemedicine).  Patients are able to view lab/test results, encounter notes, upcoming appointments, etc.  Non-urgent messages can be sent to your provider as well.   To learn more about what  you can do with MyChart, go to [email or web address].    Your next appointment:   2 month(s)  Provider:   You may see [name], MD or [place]ence Kathlen Mody, NP ( if provider doesn't have any appointments available.)

## 2022-09-13 DIAGNOSIS — I951 Orthostatic hypotension: Secondary | ICD-10-CM

## 2022-09-13 DIAGNOSIS — R42 Dizziness and giddiness: Secondary | ICD-10-CM

## 2022-09-16 DIAGNOSIS — I10 Essential (primary) hypertension: Secondary | ICD-10-CM | POA: Diagnosis not present

## 2022-09-16 DIAGNOSIS — N400 Enlarged prostate without lower urinary tract symptoms: Secondary | ICD-10-CM | POA: Diagnosis not present

## 2022-09-16 DIAGNOSIS — D631 Anemia in chronic kidney disease: Secondary | ICD-10-CM | POA: Diagnosis not present

## 2022-09-16 DIAGNOSIS — R8290 Unspecified abnormal findings in urine: Secondary | ICD-10-CM | POA: Diagnosis not present

## 2022-09-16 DIAGNOSIS — M1000 Idiopathic gout, unspecified site: Secondary | ICD-10-CM | POA: Diagnosis not present

## 2022-09-16 DIAGNOSIS — E785 Hyperlipidemia, unspecified: Secondary | ICD-10-CM | POA: Diagnosis not present

## 2022-09-16 DIAGNOSIS — K219 Gastro-esophageal reflux disease without esophagitis: Secondary | ICD-10-CM | POA: Diagnosis not present

## 2022-09-16 DIAGNOSIS — E1122 Type 2 diabetes mellitus with diabetic chronic kidney disease: Secondary | ICD-10-CM | POA: Diagnosis not present

## 2022-09-18 ENCOUNTER — Ambulatory Visit: Payer: Medicare PPO | Attending: Medical

## 2022-09-18 DIAGNOSIS — R42 Dizziness and giddiness: Secondary | ICD-10-CM | POA: Diagnosis not present

## 2022-09-18 DIAGNOSIS — I951 Orthostatic hypotension: Secondary | ICD-10-CM | POA: Diagnosis not present

## 2022-09-24 NOTE — Progress Notes (Unsigned)
I,Joseline E Rosas,acting as a scribe for [employer], MD.,have documented all relevant documentation on the behalf of Eulis Foster, MD,as directed by  Eulis Foster, MD while in the presence of Eulis Foster, MD.   Established patient visit   Patient: [name] Kone Sr.   DOB: Sep 12, 1940   82 y.o. Male  MRN: [id number] Visit Date: 09/25/2022  Today's healthcare provider: Eulis Foster, MD   Chief Complaint  Patient presents with   follow-up DM   Subjective    HPI  Diabetes Mellitus Type II, Follow-up  Lab Results  Component Value Date   HGBA1C 9.4 (A) 09/25/2022   HGBA1C 7.5 (H) 06/16/2022   HGBA1C 7.5 (H) 03/05/2022   Wt Readings from Last 3 Encounters:  09/25/22 163 lb 9.6 oz (74.2 kg)  09/09/22 165 lb (74.8 kg)  08/04/22 163 lb (73.9 kg)   Last seen for diabetes 3 months ago.  A1c increased to 9.4 Management since then includes glipizide '10mg'$ , Januvia 100, Jardiance '10mg'$  He reports excellent compliance with treatment. Patient states that he has questions about the jardiance, states that he was hospitalized in Nov of 2023 and switched from metformin  Since starting the new medication, he has been having fainting spells  He states that he has not been able to work since Thanksgiving due to these fainting spells  He states that he has been eating more potatoes and when he cut back on starches, his A1c improved in the past  States that he mostly has water, sometimes tea, reports that blood sugars range in the 100s for fasting  He states that he enjoys eating pie and has been eating more pie He adds that he has not fallen in the last three weeks, dizzy spells are usually in the mornings after taking his medications    Symptoms: No fatigue No foot ulcerations  No appetite changes No nausea  No paresthesia of the feet  No polydipsia  No polyuria No visual disturbances   No vomiting Dizziness.     Home blood sugar records:  130  Episodes of hypoglycemia? No    Current insulin regiment: none Most Recent Eye Exam: not UTD   Pertinent Labs: Lab Results  Component Value Date   CHOL 114 05/16/2021   HDL 35 (L) 05/16/2021   LDLCALC 59 05/16/2021   TRIG 104 05/16/2021   CHOLHDL 3.3 05/16/2021   Lab Results  Component Value Date   NA 130 (L) 07/30/2022   K 4.2 07/30/2022   CREATININE 1.76 (H) 07/30/2022   GFRNONAA 39 (L) 07/30/2022   LABMICR <3.0 11/22/2021   MICRALBCREAT <6 11/22/2021     ---------------------------------------------------------------------------------------------------   Hypertension, follow-up  BP Readings from Last 3 Encounters:  09/25/22 131/85  09/09/22 121/64  08/04/22 130/60   Wt Readings from Last 3 Encounters:  09/25/22 163 lb 9.6 oz (74.2 kg)  09/09/22 165 lb (74.8 kg)  08/04/22 163 lb (73.9 kg)     He was last seen for hypertension 3 months ago.  BP at that visit was 121/64. Management since that visit includes no changes, no current medications, followed by cardiology.  Outside blood pressures are being checked but he 110's-120's/70-80  Symptoms: No chest pain No chest pressure  No palpitations Yes syncope-several  No dyspnea No orthopnea  No paroxysmal nocturnal dyspnea Yes lower extremity edema   Pertinent labs Lab Results  Component Value Date   CHOL 114 05/16/2021   HDL 35 (L) 05/16/2021   LDLCALC 59 05/16/2021  TRIG 104 05/16/2021   CHOLHDL 3.3 05/16/2021   Lab Results  Component Value Date   NA 130 (L) 07/30/2022   K 4.2 07/30/2022   CREATININE 1.76 (H) 07/30/2022   GFRNONAA 39 (L) 07/30/2022   GLUCOSE 293 (H) 07/30/2022   TSH 3.840 04/21/2018     The ASCVD Risk score (Arnett DK, et al., 2019) failed to calculate for the following reasons:   The 2019 ASCVD risk score is only valid for ages 77 to  68  ---------------------------------------------------------------------------------------------------    Medications: Outpatient Medications Prior to Visit  Medication Sig   allopurinol (ZYLOPRIM) 300 MG tablet TAKE 1 TABLET BY MOUTH ONCE DAILY   aspirin 81 MG chewable tablet Chew 81 mg by mouth daily.   furosemide (LASIX) 20 MG tablet Take 1 tablet (20 mg total) by mouth daily.   gabapentin (NEURONTIN) 300 MG capsule TAKE 1 CAPSULE BY MOUTH TWICE DAILY   glipiZIDE (GLUCOTROL XL) 10 MG 24 hr tablet Take 1 tablet (10 mg total) by mouth daily with breakfast.   JANUVIA 100 MG tablet TAKE 1 TABLET BY MOUTH ONCE DAILY   ranolazine (RANEXA) 1000 MG SR tablet TAKE 1 TABLET BY MOUTH 2 TIMES DAILY   tamsulosin (FLOMAX) 0.4 MG CAPS capsule TAKE 1 CAPSULE BY MOUTH ONCE DAILY   [DISCONTINUED] atorvastatin (LIPITOR) 20 MG tablet TAKE 1 TABLET BY MOUTH ONCE DAILY   ACCU-CHEK AVIVA PLUS test strip USE AS DIRECTED TO CHECK FASTING BLOOD SUGAR EVERY MORNING   BD SHARPS CONTAINER HOME MISC Dispose of syringes after B12 injections   Blood Glucose Monitoring Suppl (ACCU-CHEK AVIVA PLUS) w/Device KIT To check blood sugar once daily   cetirizine (ZYRTEC) 10 MG tablet Take 10 mg by mouth daily. Take 1 tablet daily as needed for allergies   empagliflozin (JARDIANCE) 10 MG TABS tablet TAKE 1 TABLET BY MOUTH ONCE DAILY   nitroGLYCERIN (NITROSTAT) 0.4 MG SL tablet Place 1 tablet (0.4 mg total) under the tongue every 5 (five) minutes as needed for chest pain.   Facility-Administered Medications Prior to Visit  Medication Dose Route Frequency Provider   sodium chloride flush (NS) 0.9 % injection 3 mL  3 mL Intravenous Q12H Furth, Cadence H, [name]    Review of Systems     Objective    BP 131/85 (BP Location: Right Arm, Patient Position: Sitting, Cuff Size: Normal)   Pulse 80   Resp 16   Wt 163 lb 9.6 oz (74.2 kg)   BMI 24.88 kg/m    Physical Exam Vitals reviewed.  Constitutional:      General: He is  not in acute distress.    Appearance: Normal appearance. He is not ill-appearing, toxic-appearing or diaphoretic.  Eyes:     Conjunctiva/sclera: Conjunctivae normal.  Cardiovascular:     Rate and Rhythm: Normal rate and regular rhythm.     Pulses: Normal pulses.     Heart sounds: Normal heart sounds. No murmur heard.    No friction rub. No gallop.  Pulmonary:     Effort: Pulmonary effort is normal. No respiratory distress.     Breath sounds: Normal breath sounds. No stridor. No wheezing, rhonchi or rales.  Abdominal:     General: Bowel sounds are normal. There is no distension.     Palpations: Abdomen is soft.     Tenderness: There is no abdominal tenderness.  Musculoskeletal:     Right lower leg: No edema.     Left lower leg: No edema.  Skin:  Findings: No erythema or rash.  Neurological:     Mental Status: He is alert and oriented to person, place, and time.      Results for orders placed or performed in visit on 09/25/22  POCT glycosylated hemoglobin (Hb A1C)  Result Value Ref Range   Hemoglobin A1C 9.4 (A) 4.0 - 5.6 %   Est. average glucose Bld gHb Est-mCnc 223     Assessment & Plan     Problem List Items Addressed This Visit       Cardiovascular and Mediastinum   Hypertension associated with diabetes ([place]) - Primary    Chronic  Stable  BP within goal range today  Continue current management without medication changes/additions today       Relevant Medications   atorvastatin (LIPITOR) 20 MG tablet     Endocrine   Type 2 diabetes mellitus with hyperlipidemia (H[facility])    Chronic  Repeated A1c today, worsened and increased to 9.4  Emphasized need to cut back on starch and pie consumption,pt voiced understanding  Patient counseled on likely reason metformin was stopped, related to renal function  Encouraged him to continue jardiance '10mg'$ , januvia '100mg'$ , glipizide '10mg'$  We discussed addition of GLP  agonist however after discussion of potential side effects, I  worry about constipation and worsening dizziness Has not been fainting in the last three weeks so recommended he return after completing his Zio patch study to discuss further medication management and recommendation of whether he can return to work      Relevant Medications   atorvastatin (LIPITOR) 20 MG tablet   Other Relevant Orders   POCT glycosylated hemoglobin (Hb A1C) (Completed)     Return in about 2 weeks (around 10/09/2022) for return to work .        The entirety of the information documented in the History of Present Illness, Review of Systems and Physical Exam were personally obtained by me. Portions of this information were initially documented by Lyndel Pleasure, CMA . I, Eulis Foster, MD have reviewed the documentation above for thoroughness and accuracy.      Eulis Foster, MD  [facility] [phone] (phone) [phone] (fax)  [place]

## 2022-09-25 ENCOUNTER — Ambulatory Visit: Payer: Medicare PPO | Admitting: Physician Assistant

## 2022-09-25 ENCOUNTER — Encounter: Payer: Self-pay | Admitting: Family Medicine

## 2022-09-25 ENCOUNTER — Ambulatory Visit: Payer: Medicare PPO | Admitting: Family Medicine

## 2022-09-25 VITALS — BP 131/85 | HR 80 | Resp 16 | Wt 163.6 lb

## 2022-09-25 DIAGNOSIS — E1169 Type 2 diabetes mellitus with other specified complication: Secondary | ICD-10-CM

## 2022-09-25 DIAGNOSIS — E785 Hyperlipidemia, unspecified: Secondary | ICD-10-CM

## 2022-09-25 DIAGNOSIS — E1159 Type 2 diabetes mellitus with other circulatory complications: Secondary | ICD-10-CM | POA: Diagnosis not present

## 2022-09-25 DIAGNOSIS — I152 Hypertension secondary to endocrine disorders: Secondary | ICD-10-CM | POA: Diagnosis not present

## 2022-09-25 LAB — POCT GLYCOSYLATED HEMOGLOBIN (HGB A1C)
Est. average glucose Bld gHb Est-mCnc: 223
Hemoglobin A1C: 9.4 % — AB (ref 4.0–5.6)

## 2022-09-25 MED ORDER — ATORVASTATIN CALCIUM 20 MG PO TABS: 20.0000 mg | ORAL_TABLET | Freq: Every day | ORAL | 1 refills | Status: DC

## 2022-09-25 NOTE — Assessment & Plan Note (Signed)
Chronic  Stable  BP within goal range today  Continue current management without medication changes/additions today

## 2022-09-25 NOTE — Patient Instructions (Signed)
Please continue your Jardiance, Glipizide and Januvia as prescribed   Please continue the same dose of Jardiance as it is protective for your heart and kidneys   Your hemoglobin A1c has increased to 9.4 from 7.5 so it will be important to cut back on the pies and potatoes to help improve your glucose control

## 2022-09-25 NOTE — Assessment & Plan Note (Signed)
Chronic  Repeated A1c today, worsened and increased to 9.4  Emphasized need to cut back on starch and pie consumption,pt voiced understanding  Patient counseled on likely reason metformin was stopped, related to renal function  Encouraged him to continue jardiance '10mg'$ , januvia '100mg'$ , glipizide '10mg'$  We discussed addition of GLP  agonist however after discussion of potential side effects, I worry about constipation and worsening dizziness Has not been fainting in the last three weeks so recommended he return after completing his Zio patch study to discuss further medication management and recommendation of whether he can return to work

## 2022-09-29 ENCOUNTER — Other Ambulatory Visit: Payer: Self-pay

## 2022-09-29 DIAGNOSIS — I5043 Acute on chronic combined systolic (congestive) and diastolic (congestive) heart failure: Secondary | ICD-10-CM

## 2022-09-29 DIAGNOSIS — E785 Hyperlipidemia, unspecified: Secondary | ICD-10-CM

## 2022-10-01 ENCOUNTER — Telehealth: Payer: Self-pay

## 2022-10-01 NOTE — Progress Notes (Signed)
Care Management & Coordination Services Pharmacy Team  Reason for Encounter: Appointment Reminder  Contacted patient to confirm in office appointment with Daron Offer , PharmD on 10/03/2022 at 10:00 am.  Spoke with patient on 10/01/2022   Do you have any problems getting your medications? Yes If yes what types of problems are you experiencing? Financial barriers Patient reports all his medications "cost to much".  What is your top health concern you would like to discuss at your upcoming visit?  Overall health and medication cost.  Have you seen any other providers since your last visit with PCP? No   Chart review:  Recent office visits:  09/25/2022 Dr. Alba Cory MD (PCP) No medication changes noted, lab work completed -A1C -9.4, return in 2 weeks 07/24/2022  Neldon Labella RN (CCM) No medication changes noted 07/18/2022 Neldon Labella RN (CCM) No medication changes noted 07/09/2022 [name] [name] (PCP Office) No medication changes noted,Follow up in 3 month  06/25/2022 [name] [name] (PCP Office) No medication changes noted, lab work completed, return in 3 months 06/16/2022 [name] [name] (PCP Office) Increase lasix to 40 mg in AM and 20 mg in PM ,Return in about 1 week   Recent consult visits:  09/16/2022 Dr. Lanora Manis MD (Nephrology) No Medication changes noted 09/09/2022 Cadence Jorene Minors (Cardiology) Stop Coreg,Decrease lasix to '20mg'$  daily,Follow up in 2 months 08/04/2022 Dr. Rockey Situ MD (Cardiology) Decrease Carvedilol to 3.125 twice daily, Continue lasix 40 mg in the Am Only take the evening lasix/furosemide as needed for ankle swelling, ABD bloating, weight gain, signs of fluid 07/31/2022 Dr. Lanora Manis MD (Nephrology) Stopped Tamsulosin  07/11/2022 Cadence Jorene Minors (Cardiology) No Medication changes noted, lab work completed 07/02/2022 Lyda Jester PA- (Cardiology) Stop potassium chloride 05/16/2022 Cadence Jorene Minors (Cardiology) No  medication changes noted  Hospital visits:  Medication Reconciliation was completed by comparing discharge summary, patient's EMR and Pharmacy list, and upon discussion with patient.  Admitted to the hospital on 07/30/2022 due to Fall. Discharge date was 07/30/2022. Discharged from [place]?Medications Started at [facility] Discharge:?? -started None ID  Medication Changes at Hospital Discharge: -Changed None ID  Medications Discontinued at Hospital Discharge: -Stopped None ID  Medications that remain the same after Hospital Discharge:??  -All other medications will remain the same.    Admitted to the hospital on 07/02/2022 due to Hyperkalemia. Discharge date was 07/03/2022. Discharged from [place]?Medications Started at [facility] Discharge:?? -started None ID  Medication Changes at Hospital Discharge: -Changed None ID  Medications Discontinued at Hospital Discharge: -Stopped None ID  Medications that remain the same after Hospital Discharge:??  -All other medications will remain the same.    Admitted to the hospital on 06/16/2022 due to Acute on chronic diastolic CHF. Discharge date was 06/19/2022. Discharged from [place]?Medications Started at [facility] Discharge:?? -started Jardiance 10 mg   Medication Changes at Hospital Discharge: -Changed None ID  Medications Discontinued at Hospital Discharge: -Stopped Farxiga -Stopped Isosorbide mononitrate -Stopped lisinopril -Stopped meloxicam  -Stopped Nsaids  Medications that remain the same after Hospital Discharge:??  -All other medications will remain the same.     Star Rating Drugs:  Medication: Atorvastatin 20 mg Last Fill: 08/06/2022 90  Day Supply at [facility]. Medication: Jardiance 10 mg Last Fill: 09/09/2022 30 Day Supply at [facility]. Medication: Glipizide 10 mg Last Fill: 06/25/2022 90 Day Supply at  [facility]. Medication: Januvia 100 mg Last  Fill: 09/17/2022 90 Day Supply at [facility].  Care Gaps: Annual wellness visit in last year? No, last completed 11/22/2021 COVID-19 Vaccine Dtap Vaccine Shingrix Vaccine  If Diabetic: Last eye exam / retinopathy screening: Never done Last diabetic foot exam:Last completed 08/26/2021   Anderson Malta Clinical Pharmacist Assistant [phone]

## 2022-10-03 ENCOUNTER — Ambulatory Visit: Payer: Medicare PPO

## 2022-10-03 VITALS — BP 115/64 | HR 71

## 2022-10-03 DIAGNOSIS — E782 Mixed hyperlipidemia: Secondary | ICD-10-CM

## 2022-10-03 DIAGNOSIS — E118 Type 2 diabetes mellitus with unspecified complications: Secondary | ICD-10-CM

## 2022-10-03 DIAGNOSIS — I25718 Atherosclerosis of autologous vein coronary artery bypass graft(s) with other forms of angina pectoris: Secondary | ICD-10-CM

## 2022-10-03 DIAGNOSIS — I5042 Chronic combined systolic (congestive) and diastolic (congestive) heart failure: Secondary | ICD-10-CM

## 2022-10-03 DIAGNOSIS — E1122 Type 2 diabetes mellitus with diabetic chronic kidney disease: Secondary | ICD-10-CM

## 2022-10-03 NOTE — Progress Notes (Signed)
Care Management & Coordination Services Pharmacy Note  10/03/2022 Name:  [name]. MRN:  [id number] DOB:  [birth year]  Summary: Patient presents for initial pharmacy consult.  Patient's dizziness has improved recently.  Home blood sugars remain elevated.   Recommendations/Changes made from today's visit: Continue current medications   Follow up plan: CPP follow-up 1 month   Subjective: [name]. is an 82 y.o. year old male who is a primary patient of Simmons-Robinson, Riki Sheer, MD.  The care coordination team was consulted for assistance with disease management and care coordination needs.    Engaged with patient face to face for initial visit.  Recent office visits: 09/25/2022 Dr. Alba Cory MD (PCP) No medication changes noted, lab work completed -A1C -9.4, return in 2 weeks 07/24/2022  Neldon Labella RN (CCM) No medication changes noted 07/18/2022 Neldon Labella RN (CCM) No medication changes noted 07/09/2022 [name] [name] (PCP Office) No medication changes noted,Follow up in 3 month  06/25/2022 [name] [name] (PCP Office) No medication changes noted, lab work completed, return in 3 months 06/16/2022 [name] [name] (PCP Office) Increase lasix to 40 mg in AM and 20 mg in PM ,Return in about 1 week   Recent consult visits: 09/16/2022 Dr. Lanora Manis MD (Nephrology) No Medication changes noted 09/09/2022 Cadence Jorene Minors (Cardiology) Stop Coreg,Decrease lasix to 20mg  daily,Follow up in 2 months 08/04/2022 Dr. Rockey Situ MD (Cardiology) Decrease Carvedilol to 3.125 twice daily, Continue lasix 40 mg in the Am Only take the evening lasix/furosemide as needed for ankle swelling, ABD bloating, weight gain, signs of fluid 07/31/2022 Dr. Lanora Manis MD (Nephrology) Stopped Tamsulosin  07/11/2022 Cadence Jorene Minors (Cardiology) No Medication changes noted, lab work completed 07/02/2022 Lyda Jester PA- (Cardiology) Stop potassium  chloride 05/16/2022 Cadence Jorene Minors (Cardiology) No medication changes noted  Hospital visits:  Admitted to the hospital on 07/30/2022 due to Fall. Discharge date was 07/30/2022. Discharged from [facility].     Admitted to the hospital on 07/02/2022 due to Hyperkalemia. Discharge date was 07/03/2022. Discharged from [facility].   Admitted to the hospital on 06/16/2022 due to Acute on chronic diastolic CHF. Discharge date was 06/19/2022. Discharged from [facility].   Objective:  Lab Results  Component Value Date   CREATININE 1.76 (H) 07/30/2022   BUN 21 07/30/2022   EGFR 33 (L) 07/11/2022   GFRNONAA 39 (L) 07/30/2022   GFRAA 66 04/23/2020   NA 130 (L) 07/30/2022   K 4.2 07/30/2022   CALCIUM 8.3 (L) 07/30/2022   CO2 21 (L) 07/30/2022   GLUCOSE 293 (H) 07/30/2022    Lab Results  Component Value Date/Time   HGBA1C 9.4 (A) 09/25/2022 09:11 AM   HGBA1C 7.5 (H) 06/16/2022 09:17 AM   HGBA1C 7.5 (H) 03/05/2022 09:37 AM   MICROALBUR 50 07/20/2017 11:40 AM    Last diabetic Eye exam: No results found for: "HMDIABEYEEXA"  Last diabetic Foot exam: No results found for: "HMDIABFOOTEX"   Lab Results  Component Value Date   CHOL 114 05/16/2021   HDL 35 (L) 05/16/2021   LDLCALC 59 05/16/2021   TRIG 104 05/16/2021   CHOLHDL 3.3 05/16/2021       Latest Ref Rng & Units 06/16/2022    4:08 PM 11/22/2021    9:11 AM 05/16/2021    9:23 AM  Hepatic Function  Total Protein 6.5 - 8.1 g/dL 5.9  6.6  6.6   Albumin 3.5 - 5.0 g/dL 3.5  4.5  4.8  AST 15 - 41 U/L 27  16  16    ALT 0 - 44 U/L 52  18  19   Alk Phosphatase 38 - 126 U/L 58  56  57   Total Bilirubin 0.3 - 1.2 mg/dL 0.6  0.3  0.3   Bilirubin, Direct 0.00 - 0.40 mg/dL  0.11      Lab Results  Component Value Date/Time   TSH 3.840 04/21/2018 09:10 AM   TSH 2.810 03/13/2016 10:38 AM       Latest Ref Rng & Units 07/30/2022    7:34 PM 07/03/2022    4:59 AM 07/02/2022     2:52 PM  CBC  WBC 4.0 - 10.5 K/uL 8.8  8.5  11.0   Hemoglobin 13.0 - 17.0 g/dL 12.3  11.6  13.1   Hematocrit 39.0 - 52.0 % 36.7  35.6  40.0   Platelets 150 - 400 K/uL 237  276  302     Lab Results  Component Value Date/Time   VITAMINB12 198 (L) 03/05/2022 09:37 AM   VITAMINB12 535 01/19/2018 09:38 AM    Clinical ASCVD: No  The ASCVD Risk score (Arnett DK, et al., 2019) failed to calculate for the following reasons:   The 2019 ASCVD risk score is only valid for ages 46 to 107       09/25/2022    8:48 AM 07/18/2022   10:46 AM 11/22/2021    8:38 AM  Depression screen PHQ 2/9  Decreased Interest 0 0 0  Down, Depressed, Hopeless 0 0 0  PHQ - 2 Score 0 0 0  Altered sleeping   0  Tired, decreased energy   0  Change in appetite   0  Feeling bad or failure about yourself    0  Trouble concentrating   0  Moving slowly or fidgety/restless   0  Suicidal thoughts   0  PHQ-9 Score   0  Difficult doing work/chores   Not difficult at all     Social History   Tobacco Use  Smoking Status Never  Smokeless Tobacco Current   Types: Chew  Tobacco Comments   chew tobacco - 1 pack last 3 weeks   BP Readings from Last 3 Encounters:  09/25/22 131/85  09/09/22 121/64  08/04/22 130/60   Pulse Readings from Last 3 Encounters:  09/25/22 80  09/09/22 60  08/04/22 69   Wt Readings from Last 3 Encounters:  09/25/22 163 lb 9.6 oz (74.2 kg)  09/09/22 165 lb (74.8 kg)  08/04/22 163 lb (73.9 kg)   BMI Readings from Last 3 Encounters:  09/25/22 24.88 kg/m  09/09/22 25.09 kg/m  08/04/22 23.39 kg/m    Allergies  Allergen Reactions   Ramipril     Medications Reviewed Today     Reviewed by Eulis Foster, MD (Physician) on 09/25/22 at [place] List Status: <None>   Medication Order Taking? Sig Documenting Provider Last Dose Status Informant  ACCU-CHEK AVIVA PLUS test strip [id number]  USE AS DIRECTED TO CHECK FASTING BLOOD SUGAR EVERY MORNING [name], [name]   Active Child  allopurinol (ZYLOPRIM) 300 MG tablet [id number] Yes TAKE 1 TABLET BY MOUTH ONCE DAILY [name], [name] Taking Active Child  aspirin 81 MG chewable tablet [id number] Yes Chew 81 mg by mouth daily. [provider] Taking Active Child  atorvastatin (LIPITOR) 20 MG tablet [id number] Yes TAKE 1 TABLET BY MOUTH ONCE DAILY Jettie Booze, MD Taking Active Child  BD SHARPS [place]  [id number]  Dispose of syringes after B12 injections Mar Daring, [place]  Active Child  Blood Glucose Monitoring Suppl (ACCU-CHEK AVIVA PLUS) w/Device KIT [id number]  To check blood sugar once daily Fenton Malling M, [place]  Active Child  cetirizine (ZYRTEC) 10 MG tablet [id number]  Take 10 mg by mouth daily. Take 1 tablet daily as needed for allergies [provider]  Active Child  empagliflozin (JARDIANCE) 10 MG TABS tablet [id number]  TAKE 1 TABLET BY MOUTH ONCE DAILY Gollan, Kathlene November, MD  Active   furosemide (LASIX) 20 MG tablet [id number] Yes Take 1 tablet (20 mg total) by mouth daily. Kathlen Mody, Cadence H, [name] Taking Active   gabapentin (NEURONTIN) 300 MG capsule [id number] Yes TAKE 1 CAPSULE BY MOUTH TWICE DAILY Drubel, Ria Comment, [name] Taking Active   glipiZIDE (GLUCOTROL XL) 10 MG 24 hr tablet [id number] Yes Take 1 tablet (10 mg total) by mouth daily with breakfast. [name], [name] Taking Active Child  JANUVIA 100 MG tablet [id number] Yes TAKE 1 TABLET BY MOUTH ONCE DAILY [name], [name] Taking Active Child           Med Note [facility], PHILICIA R   Wed Jul 02, 2022  8:49 AM)    nitroGLYCERIN (NITROSTAT) 0.4 MG SL tablet [id number]  Place 1 tablet (0.4 mg total) under the tongue every 5 (five) minutes as needed for chest pain. Kathlen Mody, Cadence H, [name]  Active Child           Med Note Harl Bowie, PHILICIA R   Wed Jul 02, 2022  8:49 AM)    ranolazine (RANEXA) 1000 MG [name] tablet [id number] Yes TAKE 1 TABLET BY MOUTH 2 TIMES DAILY Gollan, Kathlene November, MD Taking Active   sodium  chloride flush (NS) 0.9 % injection 3 mL [id number]   Kathlen Mody, Cadence H, [name]  Active   tamsulosin (FLOMAX) 0.4 MG CAPS capsule [id number] Yes TAKE 1 CAPSULE BY MOUTH ONCE DAILY [name], [name] Taking Active             SDOH:  (Social Determinants of Health) assessments and interventions performed: Yes SDOH Interventions    Flowsheet Row Chronic Care Management from 07/18/2022 in [place] ED to Hosp-Admission (Discharged) from 06/16/2022 in [place] HF PCU Office Visit from 04/22/2019 in [place]  SDOH Interventions     Food Insecurity Interventions Intervention Not Indicated -- --  Housing Interventions Intervention Not Indicated Intervention Not Indicated --  Transportation Interventions Intervention Not Indicated  [Reports children currently providing transportation] -- --  Utilities Interventions Intervention Not Indicated -- --  Alcohol Usage Interventions Intervention Not Indicated (Score <7) Intervention Not Indicated (Score <7) --  Depression Interventions/Treatment  -- -- [id number] Score <4 Follow-up Not Indicated  Financial Strain Interventions Intervention Not Indicated Intervention Not Indicated --  Stress Interventions Intervention Not Indicated -- --  Social Connections Interventions Other (Comment)  [Declined need for additional community resources] -- --       Medication Assistance: None required.  Patient affirms current coverage meets needs.  Medication Access: Within the past 30 days, how often has patient missed a dose of medication? None  Is a pillbox or other method used to improve adherence? No  Factors that may affect medication adherence? no barriers identified Are meds synced by current pharmacy? No  Are meds delivered by current pharmacy? No  Does patient experience delays in picking up medications due to transportation concerns? No   Upstream Services Reviewed: Is  patient  disadvantaged to use [facility]?: No  Current Rx insurance plan: [employer] Name and location of Current pharmacy:  [place], [place] [address] [place] [address] Phone: [phone] Fax: [place] [address] [place] [address] Phone: [phone] Fax: [phone]  [id number] [place], [place] [address] [place] [address] Phone: [phone] Fax: [phone]  [facility] services reviewed with patient today?: No  Patient requests to transfer care to [facility]?: No  Reason patient declined to change pharmacies: Loyalty to other pharmacy/Patient preference  Compliance/Adherence/Medication fill history: Care Gaps: Covid Vaccine  Ophthalmology Exam  Tdap  Shingrix  Foot Exam  AWV   Star-Rating Drugs: Atorvastatin 20 mg: Last filled 08/06/22 for 90-DS  Jardiance 25 mg: Last filled 10/09/22 for 90-DS  Glipizide XL 10 mg: Last 10/09/22 for 90-DS  Januvia 100 mg: Last filled 09/17/22 for 90-DS   Assessment/Plan  Heart Failure (Goal: manage symptoms and prevent exacerbations) -Not ideally controlled -Last ejection fraction: 45-50% (Date: Nov 2023) -HF type: HFmrEF (mildly reduced EF 41-49%) -NYHA Class: II (slight limitation of activity) -AHA HF Stage: C (Heart disease and symptoms present) -Current treatment: Jardiance 10 mg daily  Furosemide 20 mg daily  Ranolazine 1000 mg twice daily  -Medications previously tried: Carvedilol (Dizziness)   -Current home BP/HR readings: 115/64, pulse 71  -Educated on Importance of weighing daily; if you gain more than 3 pounds in one day or 5 pounds in one week, take an extra furosemide -Patient is not on GDMT, but has been limited by ongoing problems with dizziness. Patient's dizziness has improved recently.  -Recommended to continue current medication  Hyperlipidemia: (LDL goal  < 55) -Not ideally controlled CAD s/p CABG x4 in 2004  -Current treatment: Atorvastatin 20 mg daily  -Current treatment: Aspirin 81 mg daily  -Medications previously tried: NA  -Recommended to continue current medication  Diabetes (A1c goal <8%) -Uncontrolled -Current medications: Glipizide XL 10 mg daily  Januvia 100 mg daily  Jardiance 25 mg daily  -Medications previously tried: Metformin (CKD)  -Current home glucose readings fasting glucose: 210 -Denies hypoglycemic/hyperglycemic symptoms -Recommended to continue current medication  Chronic Kidney Disease Stage 3b  -All medications assessed for renal dosing and appropriateness in chronic kidney disease. -Recommended to continue current medication  Junius Argyle, PharmD, [place], [place] [phone]

## 2022-10-03 NOTE — Patient Instructions (Signed)
Visit Information It was great speaking with you today!  Please let me know if you have any questions about our visit.  Plan:  Please weigh yourself daily; if you gain more than 3 pounds in one day or 5 pounds in one week, take an extra furosemide pill Please check your blood sugar daily before breakfast  Print copy of patient instructions, educational materials, and care plan provided in person.   Telephone follow up appointment with pharmacy team member scheduled for: 11/04/2022 at 8:30 AM  Junius Argyle, PharmD, [place], CPP  Clinical Pharmacist Practitioner  [place] [phone]  [place] Pharmacist Assistant [phone]

## 2022-10-04 ENCOUNTER — Other Ambulatory Visit: Payer: Self-pay | Admitting: Physician Assistant

## 2022-10-04 DIAGNOSIS — E118 Type 2 diabetes mellitus with unspecified complications: Secondary | ICD-10-CM

## 2022-10-06 NOTE — Telephone Encounter (Signed)
Requested Prescriptions  Pending Prescriptions Disp Refills   glipiZIDE (GLUCOTROL XL) 10 MG 24 hr tablet [Pharmacy Med Name: GLIPIZIDE ER '10MG'$  TABLET ER 24HR] 90 tablet 0    Sig: TAKE ONE TABLET BY MOUTH EVERY MORNING WITH BREAKFAST     Endocrinology:  Diabetes - Sulfonylureas Failed - 10/04/2022 10:13 AM      Failed - HBA1C is between 0 and 7.9 and within 180 days    Hemoglobin A1C  Date Value Ref Range Status  09/25/2022 9.4 (A) 4.0 - 5.6 % Final   Hgb A1c MFr Bld  Date Value Ref Range Status  06/16/2022 7.5 (H) 4.8 - 5.6 % Final    Comment:             Prediabetes: 5.7 - 6.4          Diabetes: >6.4          Glycemic control for adults with diabetes: <7.0          Failed - Cr in normal range and within 360 days    Creat  Date Value Ref Range Status  06/12/2017 1.43 (H) 0.70 - 1.18 mg/dL Final    Comment:    For patients >62 years of age, the reference limit for Creatinine is approximately 13% higher for people identified as African-American. .    Creatinine, Ser  Date Value Ref Range Status  07/30/2022 1.76 (H) 0.61 - 1.24 mg/dL Final         Passed - Valid encounter within last 6 months    Recent Outpatient Visits           1 week ago Hypertension associated with diabetes [facility])   [place] Simmons-Robinson, [place], MD   2 months ago Hyperkalemia   [place] [name], [name]   3 months ago Chronic combined systolic and diastolic congestive heart failure [facility])   [place] [name], [name]   3 months ago Type 2 diabetes mellitus with diabetic neuropathy, without long-term current use of insulin ([place])   [place] Thedore Mins, [place], [name]   7 months ago Type 2 diabetes mellitus with complication, without long-term current use of insulin ([place])   [place] [name], [name]       Future Appointments              In 3 days Simmons-Robinson, Riki Sheer, MD [facility] [place]   In 1 month Gollan, Kathlene November, MD [place] at [facility]

## 2022-10-08 DIAGNOSIS — R42 Dizziness and giddiness: Secondary | ICD-10-CM | POA: Diagnosis not present

## 2022-10-08 DIAGNOSIS — I951 Orthostatic hypotension: Secondary | ICD-10-CM | POA: Diagnosis not present

## 2022-10-08 NOTE — Progress Notes (Signed)
I,Joseline E Rosas,acting as a scribe for [employer], MD.,have documented all relevant documentation on the behalf of [name] Foster, MD,as directed by  [name] Foster, MD while in the presence of [name] Foster, MD.   Established patient visit   Patient: [name] Schwalenberg Sr.   DOB: 1941-03-08   82 y.o. Male  MRN: [id number] Visit Date: 10/09/2022  Today's healthcare provider: Eulis Foster, MD   Chief Complaint  Patient presents with   Follow-up   Subjective    HPI  Patient coming in to see provider for follow up regarding return to work   Zio patch study to discuss further medication management and recommendation of whether he can return to work. Reports that for the last three weeks he hasn't fallen or had problems with dizziness. He reports that he will have dinner around 5-5:30 in the evening and then when he checks his blood glucose the next day for his fasting levels it has often been elevated  For example it was 235 this Am     Medications: Outpatient Medications Prior to Visit  Medication Sig   ACCU-CHEK AVIVA PLUS test strip USE AS DIRECTED TO CHECK FASTING BLOOD SUGAR EVERY MORNING   aspirin 81 MG chewable tablet Chew 81 mg by mouth daily.   atorvastatin (LIPITOR) 20 MG tablet Take 1 tablet (20 mg total) by mouth daily.   BD SHARPS CONTAINER HOME MISC Dispose of syringes after B12 injections   Blood Glucose Monitoring Suppl (ACCU-CHEK AVIVA PLUS) w/Device KIT To check blood sugar once daily   cetirizine (ZYRTEC) 10 MG tablet Take 10 mg by mouth daily. Take 1 tablet daily as needed for allergies   ferrous sulfate 325 (65 FE) MG tablet Take 325 mg by mouth daily with breakfast.   furosemide (LASIX) 20 MG tablet Take 1 tablet (20 mg total) by mouth daily.   gabapentin (NEURONTIN) 300 MG capsule TAKE 1 CAPSULE BY MOUTH TWICE DAILY   glipiZIDE (GLUCOTROL XL) 10 MG 24 hr tablet TAKE ONE TABLET BY MOUTH EVERY  MORNING WITH BREAKFAST   JANUVIA 100 MG tablet TAKE 1 TABLET BY MOUTH ONCE DAILY   nitroGLYCERIN (NITROSTAT) 0.4 MG SL tablet Place 1 tablet (0.4 mg total) under the tongue every 5 (five) minutes as needed for chest pain.   ranolazine (RANEXA) 1000 MG SR tablet TAKE 1 TABLET BY MOUTH 2 TIMES DAILY   tamsulosin (FLOMAX) 0.4 MG CAPS capsule TAKE 1 CAPSULE BY MOUTH ONCE DAILY   [DISCONTINUED] allopurinol (ZYLOPRIM) 300 MG tablet TAKE 1 TABLET BY MOUTH ONCE DAILY   [DISCONTINUED] empagliflozin (JARDIANCE) 10 MG TABS tablet TAKE 1 TABLET BY MOUTH ONCE DAILY   Facility-Administered Medications Prior to Visit  Medication Dose Route Frequency Provider   sodium chloride flush (NS) 0.9 % injection 3 mL  3 mL Intravenous Q12H Furth, Cadence H, [name]    Review of Systems     Objective    BP 139/63 (BP Location: Right Arm, Patient Position: Sitting, Cuff Size: Normal)   Pulse 65   Resp 16   Wt 161 lb 3.2 oz (73.1 kg)   SpO2 99%   BMI 24.51 kg/m    Physical Exam Vitals reviewed.  Constitutional:      General: He is not in acute distress.    Appearance: Normal appearance. He is not ill-appearing, toxic-appearing or diaphoretic.  Eyes:     Conjunctiva/sclera: Conjunctivae normal.  Neck:     Thyroid: No thyroid mass, thyromegaly or thyroid tenderness.  Vascular: No carotid bruit.  Cardiovascular:     Rate and Rhythm: Normal rate and regular rhythm.     Pulses: Normal pulses.     Heart sounds: Normal heart sounds. No murmur heard.    No friction rub. No gallop.  Pulmonary:     Effort: Pulmonary effort is normal. No respiratory distress.     Breath sounds: Normal breath sounds. No stridor. No wheezing, rhonchi or rales.  Abdominal:     General: Bowel sounds are normal. There is no distension.     Palpations: Abdomen is soft.     Tenderness: There is no abdominal tenderness.  Musculoskeletal:     Right lower leg: No edema.     Left lower leg: No edema.  Lymphadenopathy:      Cervical: No cervical adenopathy.  Skin:    Findings: No erythema or rash.  Neurological:     Mental Status: He is alert and oriented to person, place, and time.       No results found for any visits on 10/09/22.  Assessment & Plan     Problem List Items Addressed This Visit       Endocrine   Type 2 diabetes mellitus with hyperlipidemia (HCC)   Relevant Medications   empagliflozin (JARDIANCE) 25 MG TABS tablet   Type 2 diabetes mellitus with complication, without long-term current use of insulin (HCC) - Primary    Chronic  A1c not within goal range  Reviewed previous medications tried and with recent renal function recommended that he consider insulin therapy due to fasting glucoses >200  Revisited importance of dietary management to help with glycemic control Increased jardiance to 25mg  daliy  Considered adding GLP-1 agonist, however with GFR of 39, I am hesitant to add this agent He will continue glipizide 10mg  and januvia 100mg        Relevant Medications   empagliflozin (JARDIANCE) 25 MG TABS tablet     Return in about 6 weeks (around 11/20/2022) for diabetes .        The entirety of the information documented in the History of Present Illness, Review of Systems and Physical Exam were personally obtained by me. Portions of this information were initially documented by Pricilla Handler, [place] . I, [name] Foster, MD have reviewed the documentation above for thoroughness and accuracy.    [name] Foster, MD  [facility] [phone] (phone) [phone] (fax)  [place]

## 2022-10-09 ENCOUNTER — Encounter: Payer: Self-pay | Admitting: Family Medicine

## 2022-10-09 ENCOUNTER — Other Ambulatory Visit: Payer: Self-pay | Admitting: Physician Assistant

## 2022-10-09 ENCOUNTER — Ambulatory Visit: Payer: Medicare PPO | Admitting: Family Medicine

## 2022-10-09 VITALS — BP 139/63 | HR 65 | Resp 16 | Wt 161.2 lb

## 2022-10-09 DIAGNOSIS — M1A9XX0 Chronic gout, unspecified, without tophus (tophi): Secondary | ICD-10-CM

## 2022-10-09 DIAGNOSIS — E785 Hyperlipidemia, unspecified: Secondary | ICD-10-CM | POA: Diagnosis not present

## 2022-10-09 DIAGNOSIS — E1169 Type 2 diabetes mellitus with other specified complication: Secondary | ICD-10-CM

## 2022-10-09 DIAGNOSIS — E118 Type 2 diabetes mellitus with unspecified complications: Secondary | ICD-10-CM | POA: Diagnosis not present

## 2022-10-09 MED ORDER — EMPAGLIFLOZIN 25 MG PO TABS: 25.0000 mg | ORAL_TABLET | Freq: Every day | ORAL | 3 refills | Status: DC

## 2022-10-09 NOTE — Patient Instructions (Signed)
I have adjusted the dose of your Jardiance to '25mg'$  to help with your diabetes control

## 2022-10-10 NOTE — Assessment & Plan Note (Signed)
Chronic  A1c not within goal range  Reviewed previous medications tried and with recent renal function recommended that he consider insulin therapy due to fasting glucoses >200  Revisited importance of dietary management to help with glycemic control Increased jardiance to 25mg  daliy  Considered adding GLP-1 agonist, however with GFR of 39, I am hesitant to add this agent He will continue glipizide 10mg  and januvia 100mg 

## 2022-10-13 ENCOUNTER — Ambulatory Visit: Payer: Medicare PPO | Admitting: Cardiovascular Disease

## 2022-10-14 ENCOUNTER — Other Ambulatory Visit: Payer: Self-pay | Admitting: Physician Assistant

## 2022-10-14 DIAGNOSIS — G629 Polyneuropathy, unspecified: Secondary | ICD-10-CM

## 2022-10-30 ENCOUNTER — Other Ambulatory Visit: Payer: Self-pay

## 2022-10-30 DIAGNOSIS — I471 Supraventricular tachycardia, unspecified: Secondary | ICD-10-CM

## 2022-11-04 ENCOUNTER — Ambulatory Visit: Payer: Medicare PPO

## 2022-11-04 DIAGNOSIS — I5042 Chronic combined systolic (congestive) and diastolic (congestive) heart failure: Secondary | ICD-10-CM

## 2022-11-04 DIAGNOSIS — E1122 Type 2 diabetes mellitus with diabetic chronic kidney disease: Secondary | ICD-10-CM

## 2022-11-04 MED ORDER — RYBELSUS 7 MG PO TABS: 7.0000 mg | ORAL_TABLET | Freq: Every day | ORAL | 0 refills | Status: DC

## 2022-11-04 MED ORDER — RYBELSUS 3 MG PO TABS: 3.0000 mg | ORAL_TABLET | Freq: Every day | ORAL | 0 refills | Status: DC

## 2022-11-04 NOTE — Addendum Note (Signed)
Addended by: Julious Payer A on: 11/04/2022 01:59 PM   Modules accepted: Orders

## 2022-11-04 NOTE — Progress Notes (Signed)
Care Management & Coordination Services Pharmacy Note  11/04/2022 Name:  Elvie Maines [facility] Sr. MRN:  [id number] DOB:  1941-04-07  Summary: Patient presents for follow-up pharmacy consult.   -Patient's blood sugars still significantly elevated despite increase in Jardiance. He is tolerating the dose change well. Patient is hesitant to start injectable therapy and wishes he could resume metformin, although he was counseled that would not be safe with his kidney function.   -Patient is not on GDMT, but has been limited by ongoing problems with dizziness. Patient's dizziness has improved recently. He reports one mild spell in the past two weeks. A dose reduction of ranolazine may be reasonable given patient's history of dizziness and to allow for retrial of ACEi or ARB. Patient has upcoming follow-up with Cardiology.   Recommendations/Changes made from today's visit: Recommend Rybelsus 3 mg daily.  Follow up plan: CPP follow-up 1 month   Subjective: Delynn Flavin Stender Sr. is an 82 y.o. year old male who is a primary patient of Simmons-Robinson, Tawanna Cooler, MD.  The care coordination team was consulted for assistance with disease management and care coordination needs.    Engaged with patient by telephone for follow up visit.  Recent office visits: 10/09/22: Patient presented to Dr. Roxan Hockey for follow-up. Jardiance 25 mg daily.   09/25/2022 Dr. Neita Garnet MD (PCP) No medication changes noted, lab work completed -A1C -9.4, return in 2 weeks 07/24/2022  Juanell Fairly RN (CCM) No medication changes noted 07/18/2022 Juanell Fairly RN (CCM) No medication changes noted 07/09/2022 Alfredia Ferguson [name] (PCP Office) No medication changes noted,Follow up in 3 month  06/25/2022 Alfredia Ferguson [name] (PCP Office) No medication changes noted, lab work completed, return in 3 months 06/16/2022 Alfredia Ferguson [name] (PCP Office) Increase lasix to 40 mg in AM and 20 mg in PM ,Return in about 1 week    Recent consult visits: 09/16/2022 Dr. Suezanne Jacquet MD (Nephrology) No Medication changes noted 09/09/2022 Cadence Lorna Few (Cardiology) Stop Coreg,Decrease lasix to 20mg  daily,Follow up in 2 months 08/04/2022 Dr. Mariah Milling MD (Cardiology) Decrease Carvedilol to 3.125 twice daily, Continue lasix 40 mg in the Am Only take the evening lasix/furosemide as needed for ankle swelling, ABD bloating, weight gain, signs of fluid 07/31/2022 Dr. Suezanne Jacquet MD (Nephrology) Stopped Tamsulosin  07/11/2022 Cadence Lorna Few (Cardiology) No Medication changes noted, lab work completed 07/02/2022 Robbie Lis PA- (Cardiology) Stop potassium chloride 05/16/2022 Cadence Lorna Few (Cardiology) No medication changes noted  Hospital visits:  Admitted to the hospital on 07/30/2022 due to Fall. Discharge date was 07/30/2022. Discharged from [facility].     Admitted to the hospital on 07/02/2022 due to Hyperkalemia. Discharge date was 07/03/2022. Discharged from [facility].   Admitted to the hospital on 06/16/2022 due to Acute on chronic diastolic CHF. Discharge date was 06/19/2022. Discharged from [facility].   Objective:  Lab Results  Component Value Date   CREATININE 1.76 (H) 07/30/2022   BUN 21 07/30/2022   EGFR 33 (L) 07/11/2022   GFRNONAA 39 (L) 07/30/2022   GFRAA 66 04/23/2020   NA 130 (L) 07/30/2022   K 4.2 07/30/2022   CALCIUM 8.3 (L) 07/30/2022   CO2 21 (L) 07/30/2022   GLUCOSE 293 (H) 07/30/2022    Lab Results  Component Value Date/Time   HGBA1C 9.4 (A) 09/25/2022 09:11 AM   HGBA1C 7.5 (H) 06/16/2022 09:17 AM   HGBA1C 7.5 (H) 03/05/2022 09:37 AM   MICROALBUR 50 07/20/2017 11:40 AM    Last diabetic Eye  exam: No results found for: "HMDIABEYEEXA"  Last diabetic Foot exam: No results found for: "HMDIABFOOTEX"   Lab Results  Component Value Date   CHOL 114 05/16/2021   HDL 35 (L) 05/16/2021   LDLCALC 59 05/16/2021   TRIG 104  05/16/2021   CHOLHDL 3.3 05/16/2021       Latest Ref Rng & Units 06/16/2022    4:08 PM 11/22/2021    9:11 AM 05/16/2021    9:23 AM  Hepatic Function  Total Protein 6.5 - 8.1 g/dL 5.9  6.6  6.6   Albumin 3.5 - 5.0 g/dL 3.5  4.5  4.8   AST 15 - 41 U/L 27  16  16    ALT 0 - 44 U/L 52  18  19   Alk Phosphatase 38 - 126 U/L 58  56  57   Total Bilirubin 0.3 - 1.2 mg/dL 0.6  0.3  0.3   Bilirubin, Direct 0.00 - 0.40 mg/dL  1.610.11      Lab Results  Component Value Date/Time   TSH 3.840 04/21/2018 09:10 AM   TSH 2.810 03/13/2016 10:38 AM       Latest Ref Rng & Units 07/30/2022    7:34 PM 07/03/2022    4:59 AM 07/02/2022    2:52 PM  CBC  WBC 4.0 - 10.5 K/uL 8.8  8.5  11.0   Hemoglobin 13.0 - 17.0 g/dL 09.612.3  04.511.6  40.913.1   Hematocrit 39.0 - 52.0 % 36.7  35.6  40.0   Platelets 150 - 400 K/uL 237  276  302     Lab Results  Component Value Date/Time   VITAMINB12 198 (L) 03/05/2022 09:37 AM   VITAMINB12 535 01/19/2018 09:38 AM    Clinical ASCVD: No  The ASCVD Risk score (Arnett DK, et al., 2019) failed to calculate for the following reasons:   The 2019 ASCVD risk score is only valid for ages 2640 to 3379       09/25/2022    8:48 AM 07/18/2022   10:46 AM 11/22/2021    8:38 AM  Depression screen PHQ 2/9  Decreased Interest 0 0 0  Down, Depressed, Hopeless 0 0 0  PHQ - 2 Score 0 0 0  Altered sleeping   0  Tired, decreased energy   0  Change in appetite   0  Feeling bad or failure about yourself    0  Trouble concentrating   0  Moving slowly or fidgety/restless   0  Suicidal thoughts   0  PHQ-9 Score   0  Difficult doing work/chores   Not difficult at all     Social History   Tobacco Use  Smoking Status Never  Smokeless Tobacco Current   Types: Chew  Tobacco Comments   chew tobacco - 1 pack last 3 weeks   BP Readings from Last 3 Encounters:  10/09/22 139/63  10/07/22 115/64  09/25/22 131/85   Pulse Readings from Last 3 Encounters:  10/09/22 65  10/07/22 71  09/25/22 80    Wt Readings from Last 3 Encounters:  10/09/22 161 lb 3.2 oz (73.1 kg)  09/25/22 163 lb 9.6 oz (74.2 kg)  09/09/22 165 lb (74.8 kg)   BMI Readings from Last 3 Encounters:  10/09/22 24.51 kg/m  09/25/22 24.88 kg/m  09/09/22 25.09 kg/m    Allergies  Allergen Reactions   Ramipril     Medications Reviewed Today     Reviewed by Ronnald RampSimmons-Robinson, Makiera, MD (Physician) on 10/09/22 at [phone]  Med List Status: <None>   Medication Order Taking? Sig Documenting Provider Last Dose Status Informant  ACCU-CHEK AVIVA PLUS test strip [id number] Yes USE AS DIRECTED TO CHECK FASTING BLOOD SUGAR EVERY MORNING Alfredia Ferguson, [name] Taking Active Child  allopurinol (ZYLOPRIM) 300 MG tablet [id number] Yes TAKE 1 TABLET BY MOUTH ONCE DAILY Alfredia Ferguson, [name] Taking Active Child  aspirin 81 MG chewable tablet [id number] Yes Chew 81 mg by mouth daily. [provider] Taking Active Child  atorvastatin (LIPITOR) 20 MG tablet [id number] Yes Take 1 tablet (20 mg total) by mouth daily. [name], MD Taking Active   BD SHARPS [facility] MISC [id number] Yes Dispose of syringes after B12 injections Margaretann Loveless, [name] Taking Active Child  Blood Glucose Monitoring Suppl (ACCU-CHEK AVIVA PLUS) w/Device KIT [id number] Yes To check blood sugar once daily Margaretann Loveless, [name] Taking Active Child  cetirizine (ZYRTEC) 10 MG tablet [id number] Yes Take 10 mg by mouth daily. Take 1 tablet daily as needed for allergies [provider] Taking Active Child  empagliflozin (JARDIANCE) 10 MG TABS tablet [id number] Yes TAKE 1 TABLET BY MOUTH ONCE DAILY Gollan, Tollie Pizza, MD Taking Active   ferrous sulfate 325 (65 FE) MG tablet [id number] Yes Take 325 mg by mouth daily with breakfast. [provider] Taking Active   furosemide (LASIX) 20 MG tablet [id number] Yes Take 1 tablet (20 mg total) by mouth daily. Fransico Michael, Cadence H, [name] Taking Active   gabapentin (NEURONTIN) 300 MG  capsule [id number] Yes TAKE 1 CAPSULE BY MOUTH TWICE DAILY Alfredia Ferguson, [name] Taking Active   glipiZIDE (GLUCOTROL XL) 10 MG 24 hr tablet [id number] Yes TAKE ONE TABLET BY MOUTH EVERY MORNING WITH BREAKFAST Alfredia Ferguson, [name] Taking Active   JANUVIA 100 MG tablet [id number] Yes TAKE 1 TABLET BY MOUTH ONCE DAILY Alfredia Ferguson, [name] Taking Active Child           Med Note [facility], PHILICIA R   Wed Jul 02, 2022  8:49 AM)    nitroGLYCERIN (NITROSTAT) 0.4 MG SL tablet [id number] Yes Place 1 tablet (0.4 mg total) under the tongue every 5 (five) minutes as needed for chest pain. Marianne Sofia, [name] Taking Active Child           Med Note Wyline Mood, PHILICIA R   Wed Jul 02, 2022  8:49 AM)    ranolazine (RANEXA) 1000 MG SR tablet [id number] Yes TAKE 1 TABLET BY MOUTH 2 TIMES DAILY Gollan, Tollie Pizza, MD Taking Active   sodium chloride flush (NS) 0.9 % injection 3 mL [id number]   Fransico Michael, Cadence H, [name]  Active   tamsulosin (FLOMAX) 0.4 MG CAPS capsule [id number] Yes TAKE 1 CAPSULE BY MOUTH ONCE DAILY Alfredia Ferguson, [name] Taking Active             SDOH:  (Social Determinants of Health) assessments and interventions performed: Yes SDOH Interventions    Flowsheet Row Chronic Care Management from 07/18/2022 in [facility] ED to Hosp-Admission (Discharged) from 06/16/2022 in [facility] 3E HF PCU Office Visit from 04/22/2019 in [facility]  SDOH Interventions     Food Insecurity Interventions Intervention Not Indicated -- --  Housing Interventions Intervention Not Indicated Intervention Not Indicated --  Transportation Interventions Intervention Not Indicated  [Reports children currently providing transportation] -- --  Utilities Interventions Intervention Not Indicated -- --  Alcohol Usage Interventions Intervention Not Indicated (Score <7) Intervention Not Indicated (Score <7) --  Depression Interventions/Treatment  -- --  PHQ2-9 Score <4  Follow-up Not Indicated  Financial Strain Interventions Intervention Not Indicated Intervention Not Indicated --  Stress Interventions Intervention Not Indicated -- --  Social Connections Interventions Other (Comment)  [Declined need for additional community resources] -- --       Medication Assistance: None required.  Patient affirms current coverage meets needs.  Medication Access: Within the past 30 days, how often has patient missed a dose of medication? None  Is a pillbox or other method used to improve adherence? No  Factors that may affect medication adherence? no barriers identified Are meds synced by current pharmacy? No  Are meds delivered by current pharmacy? No  Does patient experience delays in picking up medications due to transportation concerns? No   [facility] Services Reviewed: Is patient disadvantaged to use [facility]?: No  Current Rx insurance plan: [employer] Name and location of Current pharmacy:  [facility] - [place], [place] - [address] [place] [address] Phone: [phone] Fax: [phone]  [facility] [address] [place] [address] Phone: [phone] Fax: [phone]  [id number] - [place] [facility] - [address] [place] [address] Phone: [phone] Fax: [phone]  [facility] services reviewed with patient today?: No  Patient requests to transfer care to [facility]?: No  Reason patient declined to change pharmacies: Loyalty to other pharmacy/Patient preference  Compliance/Adherence/Medication fill history: Care Gaps: Covid Vaccine  Ophthalmology Exam  Tdap  Shingrix  Foot Exam  AWV   Star-Rating Drugs: Atorvastatin 20 mg: Last filled 08/06/22 for 90-DS  Jardiance 25 mg: Last filled 10/09/22 for 90-DS  Glipizide XL 10 mg: Last 10/09/22 for 90-DS  Januvia 100 mg: Last filled 09/17/22 for 90-DS    Assessment/Plan  Heart Failure (Goal: manage symptoms and prevent exacerbations) -Not ideally controlled -Last ejection fraction: 45-50% (Date: Nov 2023) -HF type: HFmrEF (mildly reduced EF 41-49%) -NYHA Class: II (slight limitation of activity) -AHA HF Stage: C (Heart disease and symptoms present) -Current treatment: Jardiance 25 mg daily  Furosemide 20 mg daily  Ranolazine 1000 mg twice daily  -Medications previously tried: Carvedilol (Dizziness)   -Current home BP/HR readings: 115/64, pulse 71  -Educated on Importance of weighing daily; if you gain more than 3 pounds in one day or 5 pounds in one week, take an extra furosemide -Patient is not on GDMT, but has been limited by ongoing problems with dizziness. Patient's dizziness has improved recently. He reports one mild spell in the past two weeks. A dose reduction of ranolazine may be reasonable given patient's history of dizziness and to allow for retrial of ACEi or ARB. Patient has upcoming follow-up with Cardiology.  -Recommended to continue current medication  Hyperlipidemia: (LDL goal < 55) -Not ideally controlled CAD s/p CABG x4 in 2004  -Current treatment: Atorvastatin 20 mg daily  -Current treatment: Aspirin 81 mg daily  -Medications previously tried: NA  -Recommended to continue current medication  Diabetes (A1c goal <8%) -Uncontrolled -Current medications: Glipizide XL 10 mg daily  Januvia 100 mg daily  Jardiance 25 mg daily  -Medications previously tried: Metformin (CKD)  -Current home glucose readings fasting glucose: 230, 228, 234 -Denies hypoglycemic/hyperglycemic symptoms -Patient's blood sugars still significantly elevated despite increase in Jardiance. He is tolerating the dose change well. Patient is hesitant to start injectable therapy and wishes he could resume metformin, although he was counseled that would not be safe with his kidney function.  -Recommended to continue current medication  Chronic  Kidney Disease Stage 3b  -All medications assessed for renal dosing and appropriateness in chronic kidney disease. -Recommended to continue current medication  Follow Up Plan: Telephone follow up appointment with care management team member scheduled for: 12/02/2022 at 8:30 AM    Angelena Sole, PharmD, Patsy Baltimore, CPP  Clinical Pharmacist Practitioner  [facility] [phone]

## 2022-11-12 ENCOUNTER — Telehealth: Payer: Self-pay

## 2022-11-12 NOTE — Progress Notes (Addendum)
Per Clinical pharmacist, Call [facility] and check price for Rybelsus.In 2 weeks: Call pt and see if he has started Rybelsus 3 mg. How is he feeling? How have his blood sugars looked?   Per [facility] pharmacy, Rybelsus 7 mg was $40.00, and patient pick it up on 11/06/2022. They did not receive any other prescriptions for 3 mg.Notified Clinical pharmacist..   Per Clinical pharmacist, please confirm if patient pick up the 3 mg samples at the office. Also please remind him to stop Januvia when he starts the 3 mg Rybelsus. Please counsel him on the following:  Swallow your Rybelsus tablet whole with a sip of water. Do not split, crush or chew the tablet. After taking your Rybelsus tablet wait at least 30 minutes before having your first meal or drink of the day or taking other oral medicines.  Everlean Cherry Clinical Pharmacist Assistant [phone]

## 2022-11-16 NOTE — Progress Notes (Deleted)
Cardiology Office Note  Date:  11/16/2022   ID:  [name] Alpha Sr., DOB 1940/12/14, MRN [id number]  PCP:  [name], MD   No chief complaint on file.    HPI:  [name] is a very pleasant 82 year old gentleman with  coronary artery disease,  Hx of CABG in 2004,  occluded graft to the diagonal branch with 3 other patent grafts to the RCA, OM with a LIMA to the LAD by catheterization in 2007  Chronic renal insufficiency Ejection fraction 45 to 50% on echo November 2023 CHF admission to hospital November 2023, discharge weight 166 pounds who presents for routine followup Of his coronary artery disease, chronic diastolic and systolic CHF, near syncope  Last seen by myself January 2024 Last seen in clinic  by one of our providers February 2024 On that visit reported dizziness, falls, near-syncope, syncope Orthostatics positive Carvedilol held, Lasix down to 20 daily  Cardiac catheterization February 2023 Chronically occluded vein graft to the diagonal #1 Severe diffuse LAD and left circumflex disease Short chronic total occlusion mid RCA with bridging left to right collaterals  saw Dr. Eldridge Dace 02/2022 and CTO PCI was felt be high risk, medical management recommended   admitted 05/2022 with heart failure.  Echo showed LVEF 45-50%, RV mildly reduced.  diuresed 8L w/ IV lasix.  GDMT Was limited by CKD. Jardiance was added. D/c weight was 166lbs.   He saw Advanced Heart failure 07/02/22 and was overall stable.   He was admitted later that day on 12/6 with hyperkalemia with K>7.5. He was taking potassium BID instead of daily.Klor-con was stopped. He was also treated for AKI with Scr up to 2.2. Metformin was stopped for AKI.  Seen in the emergency room July 30, 2022, near syncope Denies loss of consciousness, was too weak to get up off the ground Was told at the scene by EMTs his blood pressure and heart rate were too low Multiple episodes like  this over the past few months, near syncope/syncope Blood pressure 129/40 heart rate 61 in the ER Creatinine 1.76 sodium 130  On lasix 40 in AM and 20 mg PM Having near syncope recently,more than one epsiode  Orthostatics positive today supine 135/69 down to 108/67 sitting, 107/59 standing, minimal recovery after 3 minutes heart rate from 67 up to 82  Denies having chest pain concerning for angina  EKG personally reviewed by myself on todays visit Normal sinus rhythm rate 69 bpm PACs right bundle branch block  Other past medical history reviewed Cardiac catheterization 12/22/2021 for angina Multivessel coronary artery disease including severe diffuse LAD and LCx disease.  There is also a short, chronic total occlusion of the mid RCA with bridging and left-right collaterals. Widely patent LIMA-LAD and sequential SVG-OM1-OM2. Chronically occluded SVG-D1. Normal left ventricular contraction (LVEF 55-65%) with normal filling pressure (LVEDP 10 mmHg). Unsuccessful attempted PCI to mid RCA due to inability to cross the lesion using Runthrough and Fielder XT wires (including with balloon support).   Medication changes made: isosorbide mononitrate to 120 mg daily.    emergency room after syncope episode Dec 15, 2019 Working outside on the farm, felt sugar was low, got clammy and lightheaded went into the house checked his sugar it was 43, ate a sandwich, [facility], started feeling better 1 hour later began lightheaded and clammy, had brief syncopal episode lasting several seconds, witnessed by his son  creatinine 1.46 hemoglobin 11 glucose 268  Echocardiogram February 01, 2020 ejection fraction 50 to  55%, Grade 2 diastolic dysfunction, right heart pressures 45 mmHg, hypokinesis basal inferoseptal wall  Stress test August 02, 2019 Ischemia in the basal and mid inferior and inferoseptal myocardium   PMH:   has a past medical history of Arthritis, Back pain, CAD (coronary artery disease), CKD  (chronic kidney disease), stage II-III, Colon polyps, DM2 (diabetes mellitus, type 2) (HCC), GERD (gastroesophageal reflux disease), Glaucoma, Gout, HLD (hyperlipidemia), HTN (hypertension), and Neuropathy.  PSH:    Past Surgical History:  Procedure Laterality Date   BACK SURGERY     Cyst removed   CORONARY ARTERY BYPASS GRAFT  2004   CORONARY STENT INTERVENTION N/A 09/24/2021   Procedure: CORONARY STENT INTERVENTION;  Surgeon: Yvonne Kendall, MD;  Location: [facility] INVASIVE CV LAB;  Service: Cardiovascular;  Laterality: N/A;   KNEE SURGERY     LEFT HEART CATH AND CORONARY ANGIOGRAPHY Left 09/24/2021   Procedure: LEFT HEART CATH AND CORONARY ANGIOGRAPHY;  Surgeon: Yvonne Kendall, MD;  Location: [facility] INVASIVE CV LAB;  Service: Cardiovascular;  Laterality: Left;    Current Outpatient Medications  Medication Sig Dispense Refill   ACCU-CHEK AVIVA PLUS test strip USE AS DIRECTED TO CHECK FASTING BLOOD SUGAR EVERY MORNING 100 each 4   allopurinol (ZYLOPRIM) 300 MG tablet TAKE ONE TABLET BY MOUTH ONCE DAILY 90 tablet 0   aspirin 81 MG chewable tablet Chew 81 mg by mouth daily.     atorvastatin (LIPITOR) 20 MG tablet Take 1 tablet (20 mg total) by mouth daily. 90 tablet 1   BD SHARPS CONTAINER HOME MISC Dispose of syringes after B12 injections 1 each 5   Blood Glucose Monitoring Suppl (ACCU-CHEK AVIVA PLUS) w/Device KIT To check blood sugar once daily 1 kit 0   cetirizine (ZYRTEC) 10 MG tablet Take 10 mg by mouth daily. Take 1 tablet daily as needed for allergies     empagliflozin (JARDIANCE) 25 MG TABS tablet Take 1 tablet (25 mg total) by mouth daily. 30 tablet 3   ferrous sulfate 325 (65 FE) MG tablet Take 325 mg by mouth daily with breakfast.     furosemide (LASIX) 20 MG tablet Take 1 tablet (20 mg total) by mouth daily. 30 tablet 2   gabapentin (NEURONTIN) 300 MG capsule TAKE ONE CAPSULE BY MOUTH TWICE DAILY 180 capsule 1   glipiZIDE (GLUCOTROL XL) 10 MG 24 hr tablet TAKE ONE TABLET BY MOUTH  EVERY MORNING WITH BREAKFAST 90 tablet 0   nitroGLYCERIN (NITROSTAT) 0.4 MG SL tablet Place 1 tablet (0.4 mg total) under the tongue every 5 (five) minutes as needed for chest pain. 25 tablet 3   ranolazine (RANEXA) 1000 MG SR tablet TAKE 1 TABLET BY MOUTH 2 TIMES DAILY 180 tablet 3   Semaglutide (RYBELSUS) 3 MG TABS Take 1 tablet (3 mg total) by mouth daily before breakfast. 30 tablet 0   [START ON 12/04/2022] Semaglutide (RYBELSUS) 7 MG TABS Take 1 tablet (7 mg total) by mouth daily before breakfast. 30 tablet 0   tamsulosin (FLOMAX) 0.4 MG CAPS capsule TAKE 1 CAPSULE BY MOUTH ONCE DAILY 90 capsule 1   Current Facility-Administered Medications  Medication Dose Route Frequency Provider Last Rate Last Admin   sodium chloride flush (NS) 0.9 % injection 3 mL  3 mL Intravenous Q12H Furth, Cadence H, [name]         Allergies:   Ramipril   Social History:  The patient  reports that he has never smoked. His smokeless tobacco use includes chew. He reports that he does not  drink alcohol and does not use drugs.   Family History:   family history includes Stroke in his mother.    Review of Systems: Review of Systems  Constitutional: Negative.   HENT: Negative.    Respiratory: Negative.    Cardiovascular:  Positive for chest pain.  Gastrointestinal: Negative.   Musculoskeletal: Negative.   Neurological: Negative.   Psychiatric/Behavioral: Negative.    All other systems reviewed and are negative.   PHYSICAL EXAM: VS:  There were no vitals taken for this visit. , BMI There is no height or weight on file to calculate BMI.  Constitutional:  oriented to person, place, and time. No distress.  HENT:  Head: Grossly normal Eyes:  no discharge. No scleral icterus.  Neck: No JVD, no carotid bruits  Cardiovascular: Regular rate and rhythm, no murmurs appreciated Pulmonary/Chest: Clear to auscultation bilaterally, no wheezes or rails Abdominal: Soft.  no distension.  no tenderness.  Musculoskeletal:  Normal range of motion Neurological:  normal muscle tone. Coordination normal. No atrophy Skin: Skin warm and dry Psychiatric: normal affect, pleasant  Recent Labs: 06/16/2022: ALT 52 07/02/2022: B Natriuretic Peptide 121.5; Magnesium 2.1 07/30/2022: BUN 21; Creatinine, Ser 1.76; Hemoglobin 12.3; Platelets 237; Potassium 4.2; Sodium 130    Lipid Panel Lab Results  Component Value Date   CHOL 114 05/16/2021   HDL 35 (L) 05/16/2021   LDLCALC 59 05/16/2021   TRIG 104 05/16/2021    Wt Readings from Last 3 Encounters:  10/09/22 161 lb 3.2 oz (73.1 kg)  09/25/22 163 lb 9.6 oz (74.2 kg)  09/09/22 165 lb (74.8 kg)      ASSESSMENT AND PLAN:  CAD with stable angina catheterization February 2023 with severe three-vessel coronary disease Unsuccessful attempt RCA lesion Seen by CTO clinic in [place], medical management recommended On today's visit denies unstable angina symptoms Cholesterol at goal  Near syncope/syncope Orthostatics positive today, recommended he decrease carvedilol down to 3.125 twice daily Recommend he hold the evening Lasix and continue 40 daily with Lasix in the evening only for ankle swelling or abdominal bloating Weight today 3 pounds below his prior dry weight  essential hypertension - Plan: EKG 12-Lead Plan as above with decrease to carvedilol and decrease on the Lasix  Type 2 diabetes mellitus with complication, without long-term current use of insulin (HCC) A1c 7.5, managed by primary care  Hyperlipidemia Cholesterol is at goal on the current lipid regimen. No changes to the medications were made.  Extensive review of recent records, hospitalizations, long discussion concerning medical management  Total encounter time more than 40 minutes  Greater than 50% was spent in counseling and coordination of care with the patient   No orders of the defined types were placed in this encounter.    Signed, Dossie Arbour, M.D., Ph.D. 11/16/2022  [facility] [place], [place] [phone]

## 2022-11-17 ENCOUNTER — Ambulatory Visit: Payer: Medicare PPO | Admitting: Cardiovascular Disease

## 2022-11-17 DIAGNOSIS — I5022 Chronic systolic (congestive) heart failure: Secondary | ICD-10-CM

## 2022-11-17 DIAGNOSIS — I951 Orthostatic hypotension: Secondary | ICD-10-CM

## 2022-11-17 DIAGNOSIS — I25118 Atherosclerotic heart disease of native coronary artery with other forms of angina pectoris: Secondary | ICD-10-CM

## 2022-11-17 DIAGNOSIS — G4733 Obstructive sleep apnea (adult) (pediatric): Secondary | ICD-10-CM

## 2022-11-17 DIAGNOSIS — E782 Mixed hyperlipidemia: Secondary | ICD-10-CM

## 2022-11-17 DIAGNOSIS — E119 Type 2 diabetes mellitus without complications: Secondary | ICD-10-CM

## 2022-11-17 DIAGNOSIS — N183 Chronic kidney disease, stage 3 unspecified: Secondary | ICD-10-CM

## 2022-11-20 ENCOUNTER — Telehealth: Payer: Self-pay

## 2022-11-20 NOTE — Progress Notes (Signed)
Per Clinical pharmacist,  Call patient and ask if he has started Rybelsus 3 mg. How is he feeling? How have his blood sugars looked?   Patient states he has not started Rybelsus yet as he does not remember which medication he is suppose to stop. Patient reports his blood sugar was 112 once last week, but other then that it has been ranging around 160. 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 Notified Clinical pharmacist.  Per Clinical pharmacist, He should stop Januvia.   Patient verbalized understanding, and confirm he did pick up Rybelsus 3 mg from his PCP office.Patient is aware  to swallow  Rybelsus tablet whole with a sip of water. Do not split, crush or chew the tablet. After taking your Rybelsus tablet wait at least 30 minutes before having your first meal or drink of the day or taking other oral medicines.   Everlean Cherry Clinical Pharmacist Assistant [phone]

## 2022-11-24 ENCOUNTER — Encounter: Payer: Self-pay | Admitting: Medical

## 2022-11-24 ENCOUNTER — Ambulatory Visit: Payer: Medicare PPO | Attending: Cardiovascular Disease | Admitting: Medical

## 2022-11-24 VITALS — BP 116/58 | Ht 68.0 in | Wt 164.6 lb

## 2022-11-24 DIAGNOSIS — I25118 Atherosclerotic heart disease of native coronary artery with other forms of angina pectoris: Secondary | ICD-10-CM | POA: Diagnosis not present

## 2022-11-24 DIAGNOSIS — I10 Essential (primary) hypertension: Secondary | ICD-10-CM

## 2022-11-24 DIAGNOSIS — I5022 Chronic systolic (congestive) heart failure: Secondary | ICD-10-CM | POA: Diagnosis not present

## 2022-11-24 DIAGNOSIS — I471 Supraventricular tachycardia, unspecified: Secondary | ICD-10-CM

## 2022-11-24 NOTE — Progress Notes (Unsigned)
I,Joseline E Rosas,acting as a scribe for [employer], MD.,have documented all relevant documentation on the behalf of [name], MD,as directed by  [name], MD while in the presence of [name], MD.   Established patient visit   Patient: [name] Combes Sr.   DOB: [birth year]   82 y.o. Male  MRN: [id number] Visit Date: 11/25/2022  Today's healthcare provider: [name], MD   No chief complaint on file.  Subjective    HPI  Diabetes Mellitus Type II, Follow-up  Lab Results  Component Value Date   HGBA1C 9.4 (A) 09/25/2022   HGBA1C 7.5 (H) 06/16/2022   HGBA1C 7.5 (H) 03/05/2022   Wt Readings from Last 3 Encounters:  11/25/22 162 lb 6.4 oz (73.7 kg)  11/24/22 164 lb 9.6 oz (74.7 kg)  10/09/22 161 lb 3.2 oz (73.1 kg)   Last seen for diabetes 6 weeks ago.  Management since then includes Increased jardiance to 25mg  daliy  He will continue glipizide 10mg  and januvia 100mg . Reports that Januvia was very expensive. He discussed with the pharmacist starting Rybelsus 3mg  however patient did not start this medication stating that he was not sure about which medications to stop   Patient reports not sure which medicine is the new one. Reports that he ate several grapes one night and the next day, he states that his glucose was 112 the next AM   Symptoms: No fatigue No foot ulcerations  No appetite changes No nausea  Yes- paresthesia of the feet  No polydipsia  No polyuria No visual disturbances   No vomiting     Home blood sugar records:  200's , reports that it was 230 this AM  Episodes of hypoglycemia? No    Current insulin regiment: none  Pertinent Labs: Lab Results  Component Value Date   CHOL 114 05/16/2021   HDL 35 (L) 05/16/2021   LDLCALC 59 05/16/2021   TRIG 104 05/16/2021   CHOLHDL 3.3 05/16/2021   Lab Results  Component Value Date   NA 130 (L) 07/30/2022   K 4.2  07/30/2022   CREATININE 1.76 (H) 07/30/2022   GFRNONAA 39 (L) 07/30/2022   LABMICR <3.0 11/22/2021   MICRALBCREAT <6 11/22/2021     ---------------------------------------------------------------------------------------------------   Medications: Outpatient Medications Prior to Visit  Medication Sig   allopurinol (ZYLOPRIM) 300 MG tablet TAKE ONE TABLET BY MOUTH ONCE DAILY   aspirin 81 MG chewable tablet Chew 81 mg by mouth daily.   atorvastatin (LIPITOR) 20 MG tablet Take 1 tablet (20 mg total) by mouth daily.   cetirizine (ZYRTEC) 10 MG tablet Take 10 mg by mouth daily. Take 1 tablet daily as needed for allergies   empagliflozin (JARDIANCE) 25 MG TABS tablet Take 1 tablet (25 mg total) by mouth daily.   ferrous sulfate 325 (65 FE) MG tablet Take 325 mg by mouth daily with breakfast.   furosemide (LASIX) 20 MG tablet Take 1 tablet (20 mg total) by mouth daily.   gabapentin (NEURONTIN) 300 MG capsule TAKE ONE CAPSULE BY MOUTH TWICE DAILY   glipiZIDE (GLUCOTROL XL) 10 MG 24 hr tablet TAKE ONE TABLET BY MOUTH EVERY MORNING WITH BREAKFAST   nitroGLYCERIN (NITROSTAT) 0.4 MG SL tablet Place 1 tablet (0.4 mg total) under the tongue every 5 (five) minutes as needed for chest pain.   ranolazine (RANEXA) 1000 MG SR tablet TAKE 1 TABLET BY MOUTH 2 TIMES DAILY   tamsulosin (FLOMAX) 0.4 MG CAPS capsule TAKE 1 CAPSULE BY MOUTH  ONCE DAILY   ACCU-CHEK AVIVA PLUS test strip USE AS DIRECTED TO CHECK FASTING BLOOD SUGAR EVERY MORNING   BD SHARPS CONTAINER HOME MISC Dispose of syringes after B12 injections   Blood Glucose Monitoring Suppl (ACCU-CHEK AVIVA PLUS) w/Device KIT To check blood sugar once daily   Semaglutide (RYBELSUS) 3 MG TABS Take 1 tablet (3 mg total) by mouth daily before breakfast.   [START ON 12/04/2022] Semaglutide (RYBELSUS) 7 MG TABS Take 1 tablet (7 mg total) by mouth daily before breakfast.   Facility-Administered Medications Prior to Visit  Medication Dose Route Frequency Provider    sodium chloride flush (NS) 0.9 % injection 3 mL  3 mL Intravenous Q12H Furth, Cadence H, [name]    Review of Systems     Objective    BP (!) 115/50 (BP Location: Left Arm, Patient Position: Sitting, Cuff Size: Normal)   Pulse (!) 57   Temp 98 F (36.7 C) (Oral)   Resp 16   Wt 162 lb 6.4 oz (73.7 kg)   BMI 24.69 kg/m    Physical Exam Vitals reviewed.  Constitutional:      General: He is not in acute distress.    Appearance: Normal appearance. He is not ill-appearing, toxic-appearing or diaphoretic.  Eyes:     Conjunctiva/sclera: Conjunctivae normal.  Cardiovascular:     Rate and Rhythm: Normal rate and regular rhythm.     Pulses: Normal pulses.     Heart sounds: Normal heart sounds. No murmur heard.    No friction rub. No gallop.  Pulmonary:     Effort: Pulmonary effort is normal. No respiratory distress.     Breath sounds: Normal breath sounds. No stridor. No wheezing, rhonchi or rales.  Abdominal:     General: Bowel sounds are normal. There is no distension.     Palpations: Abdomen is soft.     Tenderness: There is no abdominal tenderness.  Musculoskeletal:     Right lower leg: No edema.     Left lower leg: No edema.  Skin:    Findings: No erythema or rash.  Neurological:     Mental Status: He is alert and oriented to person, place, and time.     No results found for any visits on 11/25/22.   Assessment & Plan     Problem List Items Addressed This Visit       Endocrine   Type 2 diabetes mellitus with hyperlipidemia (HCC) - Primary    Chronic  A1c not yet at goal, last A1c was 9.4 Continue glipizide 10mg , jardiance 25mg   Patient advised to start Rybelsus 3mg  daily  He will follow up in 1 month  DM foot exam completed today  Urine albumin collected today         Relevant Orders   Urine microalbumin-creatinine with uACR     Return in about 1 month (around 12/25/2022) for DM .      The entirety of the information documented in the History of  Present Illness, Review of Systems and Physical Exam were personally obtained by me. Portions of this information were initially documented by Hetty Ely, CMA . I, [name], MD have reviewed the documentation above for thoroughness and accuracy.   [name], MD  [facility] [phone] (phone) [phone] (fax)  [facility]

## 2022-11-24 NOTE — Progress Notes (Signed)
Cardiology Office Note:    Date:  11/24/2022   ID:  [name] Alpha Sr., DOB Apr 04, 1941, MRN [id number]  PCP:  [name], MD  [facility] Cardiologist:  Julien Nordmann, MD  [facility] Electrophysiologist:  None   Referring MD: Alfredia Ferguson, [name]   Chief Complaint: 3 month follow-up  History of Present Illness:    [name] Flavin Sanks Sr. is a 82 y.o. male with a hx of chronic diastolic heart failure, CAD s/p CABG in 2004, HTN, HLD, DM2, CKD stage 3 who is being seen for 1 month follow-up.   H/o CAD s/p CABG x4 in 2004 with subsequent catheterization in 2007 revealing 3 and 4 patent grafts (occluded vein graft to the diagonal). Recent medication changes include addition of Cialis and discontinuation of Imdur (given interaction between these two mediations). Coreg has also been discontinued with patient previously on Coreg 6.25mg  BID but no longer taking for unclear reasons at this time.    Myoview 07/2019 was abnormal, high risk test with possible ischemia, LVEF 43%. Echo 07/2019 showed LVEF 50-55%, G2DD, WMA, mildly dilated LA, moderately elevated pulmonary artery pressure.      Seen 09/06/21 and reported chest pain and was having exertional chest pain. A myoview lexiscan was ordered, this showed ischemia in the inferior wall with perfusion defect, moderate reversible in the inferior wall and apical region, EF 43%, moderate risk scan. Patient was brought in to discuss cardiac catheterization. He was seen 09/19/21 and was set up for outpatient cardiac catheterization.    Cath showed multivessel coronary artery disease including severe diffuse LAD and left circumflex disease.  There is also a short, chronic total occlusion of the mid RCA with bridging with left-to-right collaterals.  Widely patent LIMA to LAD and sequential SVG to OM to OM 2.  Chronically occluded SVG to D1.  Normal LVEF with normal filling pressures.  Unsuccessful attempted PCI to mid RCA due to  inability to cross the lesion using run-through and Fielder XT wires.  Recommendation for escalation of antianginal therapy, Imdur was increased to 120 mg daily.  If the patient has refractory lifestyle limiting symptoms may need referral to CTO team. Appointment was a no-show.    He saw Dr. Eldridge Dace 02/2022 and CTO PCI was felt be high risk, and activity modification was recommended.    He was admitted 05/2022 with heart failure. Echo showed LVEF 45-50%, RV mildly reduced. He was diuresed 8L w/ IV lasix. He was transitioned to oral lasix. GDMT Was limited by CKD. Jardiance was added. D/c weight was 166lbs.   He saw Advanced Heart failure 07/02/22 and was overall stable.   He was admitted later that day on 12/6 with hyperkalemia with K>7.5. He was taking potassium BID instead of daily.Klor-con was stopped. He was also treated for AKI with Scr up to 2.2. Metformin was stopped for AKI.   He was last seen 09/09/22 and reported dizziness. Orthostatics were positive. Coreg was stopped and lasix was decreased. A 2 week heart monitor was ordered.   Heart monitor showed predominantly normal sinus rhythm with average heart rate of 72 bpm, 1 run of nonsustained VT lasting 4 beats, 164 runs of SVT, longest lasting 5 minutes and 44 seconds, isolated ventricular ectopy and 11.1%.  Triggered events correlated with normal sinus rhythm, PACs, PVCs.  Today, the heart monitor was reviewed.  Overall, the patient is feeling well from a cardiac perspective.  He reports occasional dizziness when he stands up too fast.  He denies chest pain or shortness of breath.  He denies lower leg edema, orthopnea, or PND.  He is taking Lasix 20 mg daily.  Blood pressure today is good.   Past Medical History:  Diagnosis Date   Arthritis    Back pain    hx   CAD (coronary artery disease)    a. 2004 s/p CABG x 4 (LIMA->LAD, VG->Diag, VG->OM, VG->RCA); b. 2007 Cath: VG->Diag occluded, otw 3/4 patent grafts.   CKD (chronic  kidney disease), stage II-III    Colon polyps    DM2 (diabetes mellitus, type 2) (HCC)    stable   GERD (gastroesophageal reflux disease)    Glaucoma    Gout    HLD (hyperlipidemia)    HTN (hypertension)    Neuropathy     Past Surgical History:  Procedure Laterality Date   BACK SURGERY     Cyst removed   CORONARY ARTERY BYPASS GRAFT  2004   CORONARY STENT INTERVENTION N/A 09/24/2021   Procedure: CORONARY STENT INTERVENTION;  Surgeon: Yvonne Kendall, MD;  Location: [facility] INVASIVE CV LAB;  Service: Cardiovascular;  Laterality: N/A;   KNEE SURGERY     LEFT HEART CATH AND CORONARY ANGIOGRAPHY Left 09/24/2021   Procedure: LEFT HEART CATH AND CORONARY ANGIOGRAPHY;  Surgeon: Yvonne Kendall, MD;  Location: [facility] INVASIVE CV LAB;  Service: Cardiovascular;  Laterality: Left;    Current Medications: Current Meds  Medication Sig   ACCU-CHEK AVIVA PLUS test strip USE AS DIRECTED TO CHECK FASTING BLOOD SUGAR EVERY MORNING   allopurinol (ZYLOPRIM) 300 MG tablet TAKE ONE TABLET BY MOUTH ONCE DAILY   aspirin 81 MG chewable tablet Chew 81 mg by mouth daily.   atorvastatin (LIPITOR) 20 MG tablet Take 1 tablet (20 mg total) by mouth daily.   BD SHARPS CONTAINER HOME MISC Dispose of syringes after B12 injections   Blood Glucose Monitoring Suppl (ACCU-CHEK AVIVA PLUS) w/Device KIT To check blood sugar once daily   cetirizine (ZYRTEC) 10 MG tablet Take 10 mg by mouth daily. Take 1 tablet daily as needed for allergies   empagliflozin (JARDIANCE) 25 MG TABS tablet Take 1 tablet (25 mg total) by mouth daily.   ferrous sulfate 325 (65 FE) MG tablet Take 325 mg by mouth daily with breakfast.   furosemide (LASIX) 20 MG tablet Take 1 tablet (20 mg total) by mouth daily.   gabapentin (NEURONTIN) 300 MG capsule TAKE ONE CAPSULE BY MOUTH TWICE DAILY   glipiZIDE (GLUCOTROL XL) 10 MG 24 hr tablet TAKE ONE TABLET BY MOUTH EVERY MORNING WITH BREAKFAST   nitroGLYCERIN (NITROSTAT) 0.4 MG SL tablet Place 1 tablet  (0.4 mg total) under the tongue every 5 (five) minutes as needed for chest pain.   ranolazine (RANEXA) 1000 MG SR tablet TAKE 1 TABLET BY MOUTH 2 TIMES DAILY   Semaglutide (RYBELSUS) 3 MG TABS Take 1 tablet (3 mg total) by mouth daily before breakfast.   [START ON 12/04/2022] Semaglutide (RYBELSUS) 7 MG TABS Take 1 tablet (7 mg total) by mouth daily before breakfast.   tamsulosin (FLOMAX) 0.4 MG CAPS capsule TAKE 1 CAPSULE BY MOUTH ONCE DAILY   Current Facility-Administered Medications for the 11/24/22 encounter (Office Visit) with Fransico Michael, Damiel Barthold H, [name]  Medication   sodium chloride flush (NS) 0.9 % injection 3 mL     Allergies:   Ramipril   Social History   Socioeconomic History   Marital status: Widowed    Spouse name: Not on file   Number of children: 3  Years of education: Not on file   Highest education level: 8th grade  Occupational History   Occupation: Retired  Tobacco Use   Smoking status: Never   Smokeless tobacco: Current    Types: Chew   Tobacco comments:    chew tobacco - 1 pack last 3 weeks  Vaping Use   Vaping Use: Never used  Substance and Sexual Activity   Alcohol use: No    Alcohol/week: 0.0 standard drinks of alcohol   Drug use: No   Sexual activity: Not Currently  Other Topics Concern   Not on file  Social History Narrative   Not on file   Social Determinants of Health   Financial Resource Strain: Low Risk  (07/18/2022)   Overall Financial Resource Strain (CARDIA)    Difficulty of Paying Living Expenses: Not very hard  Food Insecurity: No Food Insecurity (07/18/2022)   Hunger Vital Sign    Worried About Running Out of Food in the Last Year: Never true    Ran Out of Food in the Last Year: Never true  Transportation Needs: No Transportation Needs (07/18/2022)   PRAPARE - Administrator, Civil Service (Medical): No    Lack of Transportation (Non-Medical): No  Physical Activity: Not on file  Stress: No Stress Concern Present  (07/18/2022)   [employer] of Occupational Health - Occupational Stress Questionnaire    Feeling of Stress : Not at all  Social Connections: Unknown (07/18/2022)   Social Connection and Isolation Panel [NHANES]    Frequency of Communication with Friends and Family: More than three times a week    Frequency of Social Gatherings with Friends and Family: More than three times a week    Attends Religious Services: Not on file    Active Member of Clubs or Organizations: Not on file    Attends Banker Meetings: Not on file    Marital Status: Widowed     Family History: The patient's family history includes Stroke in his mother.  ROS:   Please see the history of present illness.     All other systems reviewed and are negative.  EKGs/Labs/Other Studies Reviewed:    The following studies were reviewed today:  Heart monitor 09/2022 Normal sinus rhythm Patient had a min HR of 47 bpm, max HR of 150 bpm, and avg HR of 72 bpm.    1 run of Ventricular Tachycardia occurred lasting 4 beats with a max rate of 150 bpm (avg 142 bpm).    164 Supraventricular Tachycardia runs occurred, the run with the fastest interval lasting 4 beats with a max rate of 138 bpm, the longest lasting 5 mins 44 secs with an avg rate of 106 bpm.    Isolated SVEs were occasional (2.5%, 16109), SVE Couplets were rare (<1.0%, 4695), and SVE Triplets were rare (<1.0%, 1259).    Isolated VEs were frequent (11.1%, [id number]), VE Couplets were rare (<1.0%, 3136), and VE Triplets were rare (<1.0%, 2).  Ventricular Bigeminy and Trigeminy were present.   Patient triggered events (4) with normal sinus rhythm, PACs, PVCs  Echo 05/2022   1. Left ventricular ejection fraction, by estimation, is 45 to 50%. The  left ventricle has mildly decreased function. The left ventricle  demonstrates global hypokinesis. The left ventricular internal cavity size  was mildly dilated. Left ventricular  diastolic parameters are  consistent with Grade I diastolic dysfunction  (impaired relaxation).   2. Right ventricular systolic function is mildly reduced. The right  ventricular  size is normal. There is normal pulmonary artery systolic  pressure.   3. Left atrial size was mildly dilated.   4. The mitral valve is grossly normal. No evidence of mitral valve  regurgitation. No evidence of mitral stenosis.   5. The aortic valve is tricuspid. There is moderate calcification of the  aortic valve. Aortic valve regurgitation is not visualized. Aortic valve  sclerosis/calcification is present, without any evidence of aortic  stenosis.   6. The inferior vena cava is dilated in size with >50% respiratory  variability, suggesting right atrial pressure of 8 mmHg.   Comparison(s): Prior images reviewed side by side. LV is slightly dilated  from prior.    Echo 01/2022 1. Left ventricular ejection fraction, by estimation, is 50 to 55%. The  left ventricle has low normal function. The left ventricle has no regional  wall motion abnormalities. There is mild left ventricular hypertrophy.  Left ventricular diastolic  parameters are consistent with Grade III diastolic dysfunction  (restrictive). Elevated left atrial pressure. The average left ventricular  global longitudinal strain is -12.3 %. The global longitudinal strain is  abnormal.   2. Right ventricular systolic function is mildly reduced. The right  ventricular size is normal. There is moderately elevated pulmonary artery  systolic pressure. The estimated right ventricular systolic pressure is  49.1 mmHg.   3. Left atrial size was moderately dilated.   4. Right atrial size was moderately dilated.   5. The mitral valve is normal in structure. Mild mitral valve  regurgitation. No evidence of mitral stenosis.   6. Tricuspid valve regurgitation is mild to moderate.   7. The aortic valve is tricuspid. There is mild calcification of the  aortic valve. There is mild  thickening of the aortic valve. Aortic valve  regurgitation is not visualized. Aortic valve sclerosis/calcification is  present, without any evidence of  aortic stenosis.   8. Aortic dilatation noted. There is borderline dilatation of the aortic  root, measuring 39 mm. There is borderline dilatation of the aortic arch,  measuring 31 mm.   9. The inferior vena cava is dilated in size with <50% respiratory  variability, suggesting right atrial pressure of 15 mmHg.   Comparison(s): EF 50%, hypokinesis of the basal septal, anteroseptal,  inferior/inferoseptal regions, RVSP 44.82mmHg.      Cardiac cath 09/24/21   Conclusions: Multivessel coronary artery disease including severe diffuse LAD and LCx disease.  There is also a short, chronic total occlusion of the mid RCA with bridging and left-right collaterals. Widely patent LIMA-LAD and sequential SVG-OM1-OM2. Chronically occluded SVG-D1. Normal left ventricular contraction (LVEF 55-65%) with normal filling pressure (LVEDP 10 mmHg). Unsuccessful attempted PCI to mid RCA due to inability to cross the lesion using Runthrough and Fielder XT wires (including with balloon support).   Recommendations: Escalate antianginal therapy.  I will increase isosorbide mononitrate to 120 mg daily.  If the patient has refractory lifestyle-limiting angina despite maximal tolerated antianginal therapy, referral to our CTO team for possible repeat intervention to the mid RCA could be considered. Aggressive secondary prevention of coronary artery disease.   Yvonne Kendall, MD [facility]   Antiplatelet/Anticoag Continue aspirin 81 mg daily.  Defer adding P2Y12 inhibitor in the setting of unsuccessful PCI today.  Discharge Date In the absence of any other complications or medical issues, we expect the patient to be ready for discharge from an interventional cardiology perspective on 09/24/2021.      Coronary Diagrams   Diagnostic Dominance:  Right Intervention  EKG:  EKG is not ordered today.    Recent Labs: 06/16/2022: ALT 52 07/02/2022: B Natriuretic Peptide 121.5; Magnesium 2.1 07/30/2022: BUN 21; Creatinine, Ser 1.76; Hemoglobin 12.3; Platelets 237; Potassium 4.2; Sodium 130  Recent Lipid Panel    Component Value Date/Time   CHOL 114 05/16/2021 0923   TRIG 104 05/16/2021 0923   TRIG 136 02/28/2008 0000   HDL 35 (L) 05/16/2021 0923   CHOLHDL 3.3 05/16/2021 0923   LDLCALC 59 05/16/2021 0923     Physical Exam:    VS:  BP (!) 116/58 (BP Location: Left Arm, Patient Position: Sitting, Cuff Size: Normal)   Ht 5\' 8"  (1.727 m)   Wt 164 lb 9.6 oz (74.7 kg)   SpO2 98%   BMI 25.03 kg/m     Wt Readings from Last 3 Encounters:  11/24/22 164 lb 9.6 oz (74.7 kg)  10/09/22 161 lb 3.2 oz (73.1 kg)  09/25/22 163 lb 9.6 oz (74.2 kg)     GEN:  Well nourished, well developed in no acute distress HEENT: Normal NECK: No JVD; No carotid bruits LYMPHATICS: No lymphadenopathy CARDIAC: RRR, no murmurs, rubs, gallops RESPIRATORY:  Clear to auscultation without rales, wheezing or rhonchi  ABDOMEN: Soft, non-tender, non-distended MUSCULOSKELETAL:  No edema; No deformity  SKIN: Warm and dry NEUROLOGIC:  Alert and oriented x 3 PSYCHIATRIC:  Normal affect   ASSESSMENT:    1. SVT (supraventricular tachycardia)   2. Chronic systolic heart failure (HCC)   3. Coronary artery disease of native artery of native heart with stable angina pectoris (HCC)   4. Essential hypertension    PLAN:    In order of problems listed above:  pSVT PVCs Recent heart monitor for dizziness showed 164 runs of SVT longest lasting 5 minutes and 44 seconds, as well as PVCs 11.1%.  Patient was previously on Coreg, however this was discontinued due to orthostatic hypotension. He has occasional orthostasis.  Patient has been referred to EP.  HFrEF The patient is euvolemic on exam.  He takes Lasix 20 mg daily.  Coreg previously stopped for  orthostatic hypotension as above.  Patient is also on Jardiance 25 mg daily  CAD Patient denies chest pain.  He has known severe three-vessel CAD.  He previously saw Dr. Eldridge Dace to discuss CTO treatment, however he was deemed high risk and activity modification was recommended.  Continue aspirin, statin and Ranexa.  Disposition: Follow up in 4 month(s) with MD/APP   Signed, Jahki Witham David Stall, [name]  11/24/2022 9:24 PM    [facility]

## 2022-11-24 NOTE — Patient Instructions (Signed)
Medication Instructions:   Your physician recommends that you continue on your current medications as directed. Please refer to the Current Medication list given to you today.  *If you need a refill on your cardiac medications before your next appointment, please call your pharmacy*   Lab Work:  No Lab work ordered today.  If you have labs (blood work) drawn today and your tests are completely normal, you will receive your results only by: MyChart Message (if you have MyChart) OR A paper copy in the mail If you have any lab test that is abnormal or we need to change your treatment, we will call you to review the results.   Testing/Procedures:  No testing ordered today.   Follow-Up: At [facility], you and your health needs are our priority.  As part of our continuing mission to provide you with exceptional heart care, we have created designated Provider Care Teams.  These Care Teams include your primary Cardiologist (physician) and Advanced Practice Providers (APPs -  Physician Assistants and Nurse Practitioners) who all work together to provide you with the care you need, when you need it.  We recommend signing up for the patient portal called "MyChart".  Sign up information is provided on this After Visit Summary.  MyChart is used to connect with patients for Virtual Visits (Telemedicine).  Patients are able to view lab/test results, encounter notes, upcoming appointments, etc.  Non-urgent messages can be sent to your provider as well.   To learn more about what you can do with MyChart, go to [email or web address].    Your next appointment:   4 month(s)  Provider:   You may see Julien Nordmann, MD or one of the following Advanced Practice Providers on your designated Care Team:   Nicolasa Ducking, NP Eula Listen, [name] Cadence Fransico Michael, [name] Charlsie Quest, NP

## 2022-11-25 ENCOUNTER — Telehealth: Payer: Self-pay | Admitting: Family Medicine

## 2022-11-25 ENCOUNTER — Ambulatory Visit: Payer: Self-pay | Admitting: *Deleted

## 2022-11-25 ENCOUNTER — Ambulatory Visit: Payer: Medicare PPO | Admitting: Family Medicine

## 2022-11-25 ENCOUNTER — Encounter: Payer: Self-pay | Admitting: Family Medicine

## 2022-11-25 VITALS — BP 115/50 | HR 57 | Temp 98.0°F | Resp 16 | Wt 162.4 lb

## 2022-11-25 DIAGNOSIS — E785 Hyperlipidemia, unspecified: Secondary | ICD-10-CM | POA: Diagnosis not present

## 2022-11-25 DIAGNOSIS — E1169 Type 2 diabetes mellitus with other specified complication: Secondary | ICD-10-CM | POA: Diagnosis not present

## 2022-11-25 NOTE — Assessment & Plan Note (Signed)
Chronic  A1c not yet at goal, last A1c was 9.4 Continue glipizide 10mg , jardiance 25mg   Patient advised to start Rybelsus 3mg  daily  He will follow up in 1 month  DM foot exam completed today  Urine albumin collected today

## 2022-11-25 NOTE — Patient Instructions (Addendum)
Please see the last page for a list of your medications.   Your diabetes medications include:   Jardiance 25mg  daily  Rybelsus 3 mg daily for 1 month and then increase to 7mg   Glipizide 10mg  daily   You will stop the Januvia   We completed your foot exam today which was normal    Please work on scheduling an appointment with your eye doctor for your diabetes eye exam  Please follow up with me in 1 month for your next A1c

## 2022-11-25 NOTE — Telephone Encounter (Signed)
Summary: advice   [name] patients girlfriend called stated she does his medications for him and he has a paper that says he should be taking Semaglutide (RYBELSUS) 3 MG TABS and Semaglutide (RYBELSUS) 7 MG TABS but he has never had the 3mg  script. Please contact her back to discuss what she needs to do per she has to fill his pill counter today.  ----- Message from Elon Jester sent at 11/25/2022  1:16 PM EDT ----- [name] patients girlfriend called stated she does his medications for him and he has a paper that says he should be taking Semaglutide (RYBELSUS) 3 MG TABS and Semaglutide (RYBELSUS) 7 MG TABS but he has never had the 3mg  script. Please contact her back to discuss what she needs to do per she has to fill his pill counter today.

## 2022-11-25 NOTE — Telephone Encounter (Signed)
Message from Elon Jester sent at 11/25/2022  1:17 PM EDT  Summary: advice   Barb Merino patients girlfriend called stated she does his medications for him and he has a paper that says he should be taking Semaglutide (RYBELSUS) 3 MG TABS and Semaglutide (RYBELSUS) 7 MG TABS but he has never had the 3mg  script. Please contact her back to discuss what she needs to do per she has to fill his pill counter today.        Second attempt to call girlfriend- LM on VM to call back. Per chart review Rybelsus  3 mg was given samples. Pt is to take for 1 month then dose goes up to 7 mg. That is why no prescription at pharmacy.

## 2022-11-25 NOTE — Telephone Encounter (Signed)
error 

## 2022-11-25 NOTE — Telephone Encounter (Addendum)
Message from Elon Jester sent at 11/25/2022  1:17 PM EDT  Summary: advice   Barb Merino patients girlfriend called stated she does his medications for him and he has a paper that says he should be taking Semaglutide (RYBELSUS) 3 MG TABS and Semaglutide (RYBELSUS) 7 MG TABS but he has never had the 3mg  script. Please contact her back to discuss what she needs to do per she has to fill his pill counter today.        Third attempt to made to speak to pt's girlfirend. LM on VM to call back. Routing to office .  Reason for Disposition . Third attempt to contact caller AND no contact made. Phone number verified.  Protocols used: No Contact or Duplicate Contact Call-A-AH

## 2022-11-26 ENCOUNTER — Telehealth: Payer: Self-pay

## 2022-11-26 LAB — MICROALBUMIN / CREATININE URINE RATIO
Creatinine, Urine: 54.6 mg/dL
Microalb/Creat Ratio: <5 mg/g creat (ref 0–29)
Microalbumin, Urine: <3 ug/mL

## 2022-11-26 LAB — SPECIMEN STATUS REPORT

## 2022-11-26 NOTE — Telephone Encounter (Signed)
Copied from CRM [phone]. Topic: General - Other >> Nov 26, 2022 11:46 AM Turkey B wrote: Reason for CRM: Pt's girlfriend Ms Lalla Brothers called in about pt not having the 3mg  of rybelsus. Please call back

## 2022-11-26 NOTE — Telephone Encounter (Signed)
Rybelsus samples are available for patient to pick up from front desk in [address]. Please advise patient   [name], MD  [facility]

## 2022-11-27 NOTE — Telephone Encounter (Signed)
LM that samples for Rybelsus are ready for pick up in [address]

## 2022-11-27 NOTE — Telephone Encounter (Signed)
Ms. [name] advised to pick up samples.

## 2022-12-01 ENCOUNTER — Ambulatory Visit (INDEPENDENT_AMBULATORY_CARE_PROVIDER_SITE_OTHER): Payer: Medicare PPO

## 2022-12-01 VITALS — Ht 68.0 in | Wt 162.0 lb

## 2022-12-01 DIAGNOSIS — Z0000 Encounter for general adult medical examination without abnormal findings: Secondary | ICD-10-CM
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
## 2022-12-01 NOTE — Telephone Encounter (Signed)
Contacted [name] And advised him to pick up sample medication from [address]. Patient states that he will come and pick up the medication.   [name], MD  [facility]
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
## 2022-12-02 ENCOUNTER — Ambulatory Visit: Payer: Medicare PPO

## 2022-12-02 DIAGNOSIS — E118 Type 2 diabetes mellitus with unspecified complications: Secondary | ICD-10-CM

## 2022-12-02 NOTE — Progress Notes (Signed)
Care Management & Coordination Services Pharmacy Note  12/02/2022 Name:  [name] [facility] Sr. MRN:  [id number] DOB:  Mar 21, 1941  Summary: Patient presents for follow-up pharmacy consult.   -Patient was unclear about previous instructions and has not started Rybelsus yet. He refills his pillbox tonight so he will plan to start Rybelsus tomorrow. Extensive counseling was given including proper administration of the medication, potential side effects, and dietary modification.   Recommendations/Changes made from today's visit: -STOP Januvia  -START Rybelsus 3 mg for 30 days, then increase to 7 mg daily   Follow up plan: -HC Assessment in one week to assess side effects and ensure comprehension of above instructions.  CPP follow-up 1 month   Subjective: Delynn Flavin Hugh Sr. is an 82 y.o. year old male who is a primary patient of Simmons-Robinson, Makiera, MD.  The care coordination team was consulted for assistance with disease management and care coordination needs.    Engaged with patient by telephone for follow up visit.  Recent office visits: 11/25/22: Patient presented to Dr. Roxan Hockey for follow-up. Rybelsus 3 mg.  10/09/22: Patient presented to Dr. Roxan Hockey for follow-up. Jardiance 25 mg daily.   09/25/2022 Dr. Neita Garnet MD (PCP) No medication changes noted, lab work completed -A1C -9.4, return in 2 weeks 07/24/2022  Juanell Fairly RN (CCM) No medication changes noted 07/18/2022 Juanell Fairly RN (CCM) No medication changes noted 07/09/2022 Alfredia Ferguson [name] (PCP Office) No medication changes noted,Follow up in 3 month  06/25/2022 Alfredia Ferguson [name] (PCP Office) No medication changes noted, lab work completed, return in 3 months 06/16/2022 Alfredia Ferguson [name] (PCP Office) Increase lasix to 40 mg in AM and 20 mg in PM ,Return in about 1 week   Recent consult visits: 11/24/22: Patient presented to Cadence Lorna Few (Cardiology) for follow-up.  09/16/2022 Dr.  Suezanne Jacquet MD (Nephrology) No Medication changes noted 09/09/2022 Cadence Lorna Few (Cardiology) Stop Coreg,Decrease lasix to 20mg  daily,Follow up in 2 months 08/04/2022 Dr. Mariah Milling MD (Cardiology) Decrease Carvedilol to 3.125 twice daily, Continue lasix 40 mg in the Am Only take the evening lasix/furosemide as needed for ankle swelling, ABD bloating, weight gain, signs of fluid 07/31/2022 Dr. Suezanne Jacquet MD (Nephrology) Stopped Tamsulosin  07/11/2022 Cadence Lorna Few (Cardiology) No Medication changes noted, lab work completed 07/02/2022 Robbie Lis PA- (Cardiology) Stop potassium chloride  Hospital visits:  Admitted to the hospital on 07/30/2022 due to Fall. Discharge date was 07/30/2022. Discharged from [facility].     Admitted to the hospital on 07/02/2022 due to Hyperkalemia. Discharge date was 07/03/2022. Discharged from [facility].   Admitted to the hospital on 06/16/2022 due to Acute on chronic diastolic CHF. Discharge date was 06/19/2022. Discharged from [facility].   Objective:  Lab Results  Component Value Date   CREATININE 1.76 (H) 07/30/2022   BUN 21 07/30/2022   EGFR 33 (L) 07/11/2022   GFRNONAA 39 (L) 07/30/2022   GFRAA 66 04/23/2020   NA 130 (L) 07/30/2022   K 4.2 07/30/2022   CALCIUM 8.3 (L) 07/30/2022   CO2 21 (L) 07/30/2022   GLUCOSE 293 (H) 07/30/2022    Lab Results  Component Value Date/Time   HGBA1C 9.4 (A) 09/25/2022 09:11 AM   HGBA1C 7.5 (H) 06/16/2022 09:17 AM   HGBA1C 7.5 (H) 03/05/2022 09:37 AM   MICROALBUR 50 07/20/2017 11:40 AM    Last diabetic Eye exam: No results found for: "HMDIABEYEEXA"  Last diabetic Foot exam: No results found for: "HMDIABFOOTEX"   Lab  Results  Component Value Date   CHOL 114 05/16/2021   HDL 35 (L) 05/16/2021   LDLCALC 59 05/16/2021   TRIG 104 05/16/2021   CHOLHDL 3.3 05/16/2021       Latest Ref Rng & Units 06/16/2022    4:08 PM 11/22/2021    9:11 AM  05/16/2021    9:23 AM  Hepatic Function  Total Protein 6.5 - 8.1 g/dL 5.9  6.6  6.6   Albumin 3.5 - 5.0 g/dL 3.5  4.5  4.8   AST 15 - 41 U/L 27  16  16    ALT 0 - 44 U/L 52  18  19   Alk Phosphatase 38 - 126 U/L 58  56  57   Total Bilirubin 0.3 - 1.2 mg/dL 0.6  0.3  0.3   Bilirubin, Direct 0.00 - 0.40 mg/dL  1.61      Lab Results  Component Value Date/Time   TSH 3.840 04/21/2018 09:10 AM   TSH 2.810 03/13/2016 10:38 AM       Latest Ref Rng & Units 07/30/2022    7:34 PM 07/03/2022    4:59 AM 07/02/2022    2:52 PM  CBC  WBC 4.0 - 10.5 K/uL 8.8  8.5  11.0   Hemoglobin 13.0 - 17.0 g/dL 09.6  04.5  40.9   Hematocrit 39.0 - 52.0 % 36.7  35.6  40.0   Platelets 150 - 400 K/uL 237  276  302     Lab Results  Component Value Date/Time   VITAMINB12 198 (L) 03/05/2022 09:37 AM   VITAMINB12 535 01/19/2018 09:38 AM    Clinical ASCVD: No  The ASCVD Risk score (Arnett DK, et al., 2019) failed to calculate for the following reasons:   The 2019 ASCVD risk score is only valid for ages 66 to 65       12/01/2022    8:41 AM 09/25/2022    8:48 AM 07/18/2022   10:46 AM  Depression screen PHQ 2/9  Decreased Interest 0 0 0  Down, Depressed, Hopeless 0 0 0  PHQ - 2 Score 0 0 0     Social History   Tobacco Use  Smoking Status Never  Smokeless Tobacco Current   Types: Chew  Tobacco Comments   chew tobacco - 1 pack last 3 weeks   BP Readings from Last 3 Encounters:  11/25/22 (!) 115/50  11/24/22 (!) 116/58  10/09/22 139/63   Pulse Readings from Last 3 Encounters:  11/25/22 (!) 57  10/09/22 65  10/07/22 71   Wt Readings from Last 3 Encounters:  12/01/22 162 lb (73.5 kg)  11/25/22 162 lb 6.4 oz (73.7 kg)  11/24/22 164 lb 9.6 oz (74.7 kg)   BMI Readings from Last 3 Encounters:  12/01/22 24.63 kg/m  11/25/22 24.69 kg/m  11/24/22 25.03 kg/m    Allergies  Allergen Reactions   Ramipril     Medications Reviewed Today     Reviewed by Sue Lush, LPN (Licensed  Practical Nurse) on 12/01/22 at 0840  Med List Status: <None>   Medication Order Taking? Sig Documenting Provider Last Dose Status Informant  ACCU-CHEK AVIVA PLUS test strip [id number] Yes USE AS DIRECTED TO CHECK FASTING BLOOD SUGAR EVERY MORNING Alfredia Ferguson, [name] Taking Active Child  allopurinol (ZYLOPRIM) 300 MG tablet [id number] Yes TAKE ONE TABLET BY MOUTH ONCE DAILY Simmons-Robinson, Makiera, MD Taking Active   aspirin 81 MG chewable tablet [id number] Yes Chew 81 mg by mouth daily. [provider] Taking  Active Child  atorvastatin (LIPITOR) 20 MG tablet [id number] Yes Take 1 tablet (20 mg total) by mouth daily. [name], MD Taking Active   BD SHARPS [facility] MISC [id number] Yes Dispose of syringes after B12 injections Margaretann Loveless, [name] Taking Active Child  Blood Glucose Monitoring Suppl (ACCU-CHEK AVIVA PLUS) w/Device KIT [id number] Yes To check blood sugar once daily Margaretann Loveless, [name] Taking Active Child  cetirizine (ZYRTEC) 10 MG tablet [id number] Yes Take 10 mg by mouth daily. Take 1 tablet daily as needed for allergies [provider] Taking Active Child  empagliflozin (JARDIANCE) 25 MG TABS tablet [id number] Yes Take 1 tablet (25 mg total) by mouth daily. Simmons-Robinson, Makiera, MD Taking Active   ferrous sulfate 325 (65 FE) MG tablet [id number] Yes Take 325 mg by mouth daily with breakfast. [provider] Taking Active   furosemide (LASIX) 20 MG tablet [id number] Yes Take 1 tablet (20 mg total) by mouth daily. Furth, Cadence H, [name] Taking Active   gabapentin (NEURONTIN) 300 MG capsule [id number] Yes TAKE ONE CAPSULE BY MOUTH TWICE DAILY Simmons-Robinson, Makiera, MD Taking Active   glipiZIDE (GLUCOTROL XL) 10 MG 24 hr tablet [id number] Yes TAKE ONE TABLET BY MOUTH EVERY MORNING WITH BREAKFAST Alfredia Ferguson, [name] Taking Active   nitroGLYCERIN (NITROSTAT) 0.4 MG SL tablet [id number] Yes Place 1 tablet (0.4 mg total) under the  tongue every 5 (five) minutes as needed for chest pain. Fransico Michael, Cadence H, [name] Taking Active Child           Med Note [facility], PHILICIA R   Wed Jul 02, 2022  8:49 AM)    ranolazine (RANEXA) 1000 MG SR tablet [id number] Yes TAKE 1 TABLET BY MOUTH 2 TIMES DAILY Gollan, Tollie Pizza, MD Taking Active   Semaglutide (RYBELSUS) 3 MG TABS [id number] Yes Take 1 tablet (3 mg total) by mouth daily before breakfast. Simmons-Robinson, Makiera, MD Taking Active   Semaglutide (RYBELSUS) 7 MG TABS [id number] Yes Take 1 tablet (7 mg total) by mouth daily before breakfast. Simmons-Robinson, Makiera, MD Taking Active   sodium chloride flush (NS) 0.9 % injection 3 mL [id number]   Fransico Michael, Cadence H, [name]  Active   tamsulosin (FLOMAX) 0.4 MG CAPS capsule [id number] Yes TAKE 1 CAPSULE BY MOUTH ONCE DAILY Alfredia Ferguson, [name] Taking Active             SDOH:  (Social Determinants of Health) assessments and interventions performed: Yes SDOH Interventions    Flowsheet Row Clinical Support from 12/01/2022 in [facility] Chronic Care Management from 07/18/2022 in [facility] ED to Hosp-Admission (Discharged) from 06/16/2022 in [facility] 3E HF PCU Office Visit from 04/22/2019 in [facility]  SDOH Interventions      Food Insecurity Interventions Intervention Not Indicated Intervention Not Indicated -- --  Housing Interventions Intervention Not Indicated Intervention Not Indicated Intervention Not Indicated --  Transportation Interventions Intervention Not Indicated Intervention Not Indicated  [Reports children currently providing transportation] -- --  Utilities Interventions Intervention Not Indicated Intervention Not Indicated -- --  Alcohol Usage Interventions Intervention Not Indicated (Score <7) Intervention Not Indicated (Score <7) Intervention Not Indicated (Score <7) --  Depression Interventions/Treatment  -- -- -- GYI9-4 Score <4  Follow-up Not Indicated  Financial Strain Interventions Intervention Not Indicated Intervention Not Indicated Intervention Not Indicated --  Physical Activity Interventions Intervention Not Indicated -- -- --  Stress Interventions Intervention Not Indicated Intervention Not Indicated -- --  Social Connections  Interventions Intervention Not Indicated Other (Comment)  [Declined need for additional community resources] -- --       Medication Assistance: None required.  Patient affirms current coverage meets needs.  Medication Access: Within the past 30 days, how often has patient missed a dose of medication? None  Is a pillbox or other method used to improve adherence? No  Factors that may affect medication adherence? no barriers identified Are meds synced by current pharmacy? No  Are meds delivered by current pharmacy? No  Does patient experience delays in picking up medications due to transportation concerns? No   Upstream Services Reviewed: Is patient disadvantaged to use UpStream Pharmacy?: No  Current Rx insurance plan: [employer] Name and location of Current pharmacy:  [facility] - [place], [place] - [address] [place] [address] Phone: [phone] Fax: [phone]  [facility] [address] [place] [address] Phone: [phone] Fax: [phone]  [id number] - [place] [facility] - [address] [place] [address] Phone: [phone] Fax: [phone]  UpStream Pharmacy services reviewed with patient today?: No  Patient requests to transfer care to Upstream Pharmacy?: No  Reason patient declined to change pharmacies: Loyalty to other pharmacy/Patient preference  Compliance/Adherence/Medication fill history: Care Gaps: Covid Vaccine  Ophthalmology Exam  Tdap  Shingrix  Foot Exam  AWV   Star-Rating Drugs: Atorvastatin 20 mg: Last filled 08/06/22 for 90-DS  Jardiance  25 mg: Last filled 10/09/22 for 90-DS  Glipizide XL 10 mg: Last 10/09/22 for 90-DS   Assessment/Plan  Heart Failure (Goal: manage symptoms and prevent exacerbations) -Not ideally controlled -Last ejection fraction: 45-50% (Date: Nov 2023) -HF type: HFmrEF (mildly reduced EF 41-49%) -NYHA Class: II (slight limitation of activity) -AHA HF Stage: C (Heart disease and symptoms present) -Current treatment: Jardiance 25 mg daily  Furosemide 20 mg daily  Ranolazine 1000 mg twice daily  -Medications previously tried: Carvedilol (Dizziness)   -Current home BP/HR readings: 115/64, pulse 71  -Educated on Importance of weighing daily; if you gain more than 3 pounds in one day or 5 pounds in one week, take an extra furosemide -Patient is not on GDMT, but has been limited by ongoing problems with dizziness. Patient's dizziness has improved recently. He reports one mild spell in the past two weeks. A dose reduction of ranolazine may be reasonable given patient's history of dizziness and to allow for retrial of ACEi or ARB. Patient has upcoming follow-up with Cardiology.  -Recommended to continue current medication  Hyperlipidemia: (LDL goal < 55) -Not ideally controlled CAD s/p CABG x4 in 2004  -Current treatment: Atorvastatin 20 mg daily  -Current treatment: Aspirin 81 mg daily  -Medications previously tried: NA  -Recommended to continue current medication  Diabetes (A1c goal <8%) -Uncontrolled -Current medications: Glipizide XL 10 mg daily  Rybelsus 3 mg daily  Jardiance 25 mg daily  -Medications previously tried: Metformin (CKD), Januvia  -Current home glucose readings fasting glucose: typically 200+, he does report some lower readings including 112 and 164.  -Denies hypoglycemic/hyperglycemic symptoms -Patient was unclear about previous instructions and has not started Rybelsus yet. He refills his pillbox tonight so he will plan to start Rybelsus tomorrow. Extensive counseling was given  including proper administration of the medication, potential side effects, and dietary modification.  -STOP Januvia  -START Rybelsus 3 mg for 30 days, then increase to 7 mg daily  -HC Assessment in one week to assess side effects and ensure comprehension of above  instructions.   Chronic Kidney Disease Stage 3b  -All medications assessed for renal dosing and appropriateness in chronic kidney disease. -Recommended to continue current medication  Follow Up Plan: Telephone follow up appointment with care management team member scheduled for: 12/02/2022 at 8:30 AM    Angelena Sole, PharmD, Patsy Baltimore, CPP  Clinical Pharmacist Practitioner  [facility] [phone]

## 2022-12-08 ENCOUNTER — Telehealth: Payer: Self-pay

## 2022-12-08 NOTE — Progress Notes (Signed)
Care Management & Coordination Services Pharmacy Team  Reason for Encounter: Diabetes  Contacted patient to discuss diabetes disease state.  Spoke with patient on 12/08/2022   Current antihyperglycemic regimen:  Glipizide XL 10 mg daily  Rybelsus 3 mg daily  Jardiance 25 mg daily     Patient verbally confirms he is taking the above medications as directed. Yes  Patient denies any issue with taking Rybelsus.Patient states he has been feeling good with no complaints regarding Rybelsus.  What diet changes have been made to improve diabetes control? Patient denies any changes since he spoken with the clinical pharmacist.  What recent interventions/DTPs have been made to improve glycemic control:  Patient states ever since he stop metformin and was started on Jardiance his blood sugars has been elevated.Patient reports his blood sugars use to be around 130's,but now almost all the time above 200's.Patient is asking if London Pepper is causing his blood sugar to be elevated, or if this medication  is for him.Notified Clinical pharmacist.  Per Clinical pharmacist, Please inform Dakwan that London Pepper is an important medication for protecting his kidneys from damage. Now that he has started on Rybelsus his blood sugars should start to improve, but he might not see a significant difference until he starts on the 7 mg tablets. For now I would recommend he keep a close eye on what he eats and to continue taking his medications everyday.     Patient verbalized understanding.  Have there been any recent hospitalizations or ED visits since last visit with PharmD? No  Patient denies hypoglycemic symptoms, including Pale, Sweaty, Shaky, Hungry, Nervous/irritable, and Vision changes  Patient denies hyperglycemic symptoms, including blurry vision, excessive thirst, fatigue, polyuria, and weakness  How often are you checking your blood sugar? once daily  What are your blood sugars ranging?  Patient  reports his blood sugars have been ranging around 230's.Patient reports his blood sugar was 236 this morning.  During the week, how often does your blood glucose drop below 70? Never  Are you checking your feet daily/regularly? Yes  Adherence Review: Is the patient currently on a STATIN medication? Yes Is the patient currently on ACE/ARB medication? No Does the patient have >5 day gap between last estimated fill dates? No  Star Rating Drugs:   Atorvastatin 20 mg Last Fill: 11/12/2022 90  Day Supply at [facility]. Jardiance 10 mg Last Fill: 12/03/2022 30 Day Supply at [facility]. Glipizide 10 mg Last Fill: 10/06/2022 90 [facility]. Rybelsus 7 mg Last Fill: 11/04/2022 30 Day Supply at [facility].   Care Gaps: Annual wellness visit in last year? Yes, last completed 12/01/2022 COVID-19 Vaccine Dtap Vaccine Shingrix Vaccine   If Diabetic: Last eye exam / retinopathy screening: Never done Last diabetic foot exam:Last completed 11/25/2022    Chart Updates:  Recent office visits:  None ID  Recent consult visits:  None ID  Hospital visits:  None in previous 6 months  Medications: Outpatient Encounter Medications as of 12/08/2022  Medication Sig   ACCU-CHEK AVIVA PLUS test strip USE AS DIRECTED TO CHECK FASTING BLOOD SUGAR EVERY MORNING   allopurinol (ZYLOPRIM) 300 MG tablet TAKE ONE TABLET BY MOUTH ONCE DAILY   aspirin 81 MG chewable tablet Chew 81 mg by mouth daily.   atorvastatin (LIPITOR) 20 MG tablet Take 1 tablet (20 mg total) by mouth daily.   BD SHARPS CONTAINER HOME MISC Dispose of syringes after B12 injections   Blood Glucose Monitoring Suppl (ACCU-CHEK AVIVA PLUS)  w/Device KIT To check blood sugar once daily   cetirizine (ZYRTEC) 10 MG tablet Take 10 mg by mouth daily. Take 1 tablet daily as needed for allergies   empagliflozin (JARDIANCE) 25 MG TABS tablet Take 1 tablet (25 mg total) by mouth daily.   ferrous  sulfate 325 (65 FE) MG tablet Take 325 mg by mouth daily with breakfast.   furosemide (LASIX) 20 MG tablet Take 1 tablet (20 mg total) by mouth daily.   gabapentin (NEURONTIN) 300 MG capsule TAKE ONE CAPSULE BY MOUTH TWICE DAILY   glipiZIDE (GLUCOTROL XL) 10 MG 24 hr tablet TAKE ONE TABLET BY MOUTH EVERY MORNING WITH BREAKFAST   nitroGLYCERIN (NITROSTAT) 0.4 MG SL tablet Place 1 tablet (0.4 mg total) under the tongue every 5 (five) minutes as needed for chest pain.   ranolazine (RANEXA) 1000 MG SR tablet TAKE 1 TABLET BY MOUTH 2 TIMES DAILY   Semaglutide (RYBELSUS) 3 MG TABS Take 1 tablet (3 mg total) by mouth daily before breakfast.   Semaglutide (RYBELSUS) 7 MG TABS Take 1 tablet (7 mg total) by mouth daily before breakfast.   tamsulosin (FLOMAX) 0.4 MG CAPS capsule TAKE 1 CAPSULE BY MOUTH ONCE DAILY   Facility-Administered Encounter Medications as of 12/08/2022  Medication   sodium chloride flush (NS) 0.9 % injection 3 mL    Recent Relevant Labs: Lab Results  Component Value Date/Time   HGBA1C 9.4 (A) 09/25/2022 09:11 AM   HGBA1C 7.5 (H) 06/16/2022 09:17 AM   HGBA1C 7.5 (H) 03/05/2022 09:37 AM   MICROALBUR 50 07/20/2017 11:40 AM    Kidney Function Lab Results  Component Value Date/Time   CREATININE 1.76 (H) 07/30/2022 07:34 PM   CREATININE 2.03 (H) 07/11/2022 09:18 AM   CREATININE 1.43 (H) 06/12/2017 11:46 AM   GFRNONAA 39 (L) 07/30/2022 07:34 PM   GFRNONAA 47 (L) 06/12/2017 11:46 AM   GFRAA 66 04/23/2020 09:17 AM   GFRAA 55 (L) 06/12/2017 11:46 AM     [employer] Clinical Pharmacist Assistant [phone]

## 2022-12-16 DIAGNOSIS — D631 Anemia in chronic kidney disease: Secondary | ICD-10-CM | POA: Diagnosis not present

## 2022-12-16 DIAGNOSIS — K219 Gastro-esophageal reflux disease without esophagitis: Secondary | ICD-10-CM | POA: Diagnosis not present

## 2022-12-16 DIAGNOSIS — E1122 Type 2 diabetes mellitus with diabetic chronic kidney disease: Secondary | ICD-10-CM | POA: Diagnosis not present

## 2022-12-16 DIAGNOSIS — I10 Essential (primary) hypertension: Secondary | ICD-10-CM | POA: Diagnosis not present

## 2022-12-16 DIAGNOSIS — M1000 Idiopathic gout, unspecified site: Secondary | ICD-10-CM | POA: Diagnosis not present

## 2022-12-16 DIAGNOSIS — N400 Enlarged prostate without lower urinary tract symptoms: Secondary | ICD-10-CM | POA: Diagnosis not present

## 2022-12-16 DIAGNOSIS — E785 Hyperlipidemia, unspecified: Secondary | ICD-10-CM | POA: Diagnosis not present

## 2022-12-16 NOTE — Progress Notes (Unsigned)
  Electrophysiology Office Note:    Date:  12/17/2022   ID:  [name] Alpha [name]., DOB 11-Feb-1941, MRN [id number]  [facility] Cardiologist:  Julien Nordmann, MD  [facility] Electrophysiologist:  Lanier Prude, MD   Referring MD: Marianne Sofia, [name]   Chief Complaint: SVT  History of Present Illness:    [name]. is a 82 y.o. male who I am seeing today for an evaluation of SVT at the request of Cadence Firth.  The patient saw Cadence [facility] November 24, 2022.  The patient has a history of chronic diastolic heart failure, coronary artery disease post CABG in 2004, hypertension, hyperlipidemia, diabetes, CKD 3.  The patient wore a monitor in March of this year which showed 164 episodes of SVT with the longest lasting 5 minutes and 44 seconds.  There were also frequent ventricular ectopics, 11.1%.  He tells me that he feels skipped beats occasionally and every once a while will feel a flurry of heartbeats.  Also intermittently has centrally located chest discomfort for which she takes nitroglycerin.    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  Now he works on his farm growing soybeans.  He is very active.  He is in clinic today with his son.      Their past medical, social and family history was reveiwed.   ROS:   Please see the history of present illness.    All other systems reviewed and are negative.  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 SVT (supraventricular tachycardia)   2. Coronary artery disease of native artery of native heart with stable angina pectoris [facility])     #SVT #Frequent PVCs Start metoprolol succinate 25 mg by mouth twice daily, favor uptitrating as tolerated.  I would avoid initiation of antiarrhythmic drugs at this time given presence of conduction system disease on his baseline EKG.  I do not think EP study and ablation is indicated at this time given the likely etiology of his arrhythmia being atrial tachycardia.  #Coronary artery disease Continue aspirin, atorvastatin, ranolazine No ischemic symptoms today.  Continue Ranexa.  Add metoprolol.   Follow-up with EP APP in 8 to 12 weeks.   Signed, Rossie Muskrat. Lalla Brothers, MD, [facility], [facility] 12/17/2022 8:35 AM    Electrophysiology [facility]

## 2022-12-17 ENCOUNTER — Ambulatory Visit: Payer: Medicare PPO | Attending: Cardiology | Admitting: Cardiology

## 2022-12-17 ENCOUNTER — Encounter: Payer: Self-pay | Admitting: Cardiology

## 2022-12-17 VITALS — BP 148/64 | HR 71 | Ht 68.0 in | Wt 161.4 lb

## 2022-12-17 DIAGNOSIS — I471 Supraventricular tachycardia, unspecified: Secondary | ICD-10-CM | POA: Diagnosis not present

## 2022-12-17 DIAGNOSIS — I25118 Atherosclerotic heart disease of native coronary artery with other forms of angina pectoris: Secondary | ICD-10-CM

## 2022-12-17 MED ORDER — METOPROLOL SUCCINATE ER 25 MG PO TB24: 25.0000 mg | ORAL_TABLET | Freq: Two times a day (BID) | ORAL | 3 refills | Status: DC

## 2022-12-17 NOTE — Patient Instructions (Signed)
Medication Instructions:  Your physician has recommended you make the following change in your medication:  1) START taking Toprol XL (metoprolol succinate) 25 mg twice daily  *If you need a refill on your cardiac medications before your next appointment, please call your pharmacy*  Follow-Up: At [facility], you and your health needs are our priority.  As part of our continuing mission to provide you with exceptional heart care, we have created designated Provider Care Teams.  These Care Teams include your primary Cardiologist (physician) and Advanced Practice Providers (APPs -  Physician Assistants and Nurse Practitioners) who all work together to provide you with the care you need, when you need it.   Your next appointment:   8-12 week(s)  Provider:   Sherie Don, NP

## 2022-12-25 ENCOUNTER — Ambulatory Visit
Admission: RE | Admit: 2022-12-25 | Discharge: 2022-12-25 | Disposition: A | Discharge status: Home / Self Care | Payer: Medicare PPO | Source: Ambulatory Visit | Attending: Nephrology | Admitting: Nephrology

## 2022-12-25 DIAGNOSIS — N189 Chronic kidney disease, unspecified: Secondary | ICD-10-CM | POA: Diagnosis not present

## 2022-12-25 DIAGNOSIS — R8290 Unspecified abnormal findings in urine: Secondary | ICD-10-CM | POA: Diagnosis not present

## 2022-12-25 DIAGNOSIS — E1122 Type 2 diabetes mellitus with diabetic chronic kidney disease: Secondary | ICD-10-CM | POA: Diagnosis not present

## 2022-12-25 DIAGNOSIS — E785 Hyperlipidemia, unspecified: Secondary | ICD-10-CM | POA: Diagnosis not present

## 2022-12-25 DIAGNOSIS — D631 Anemia in chronic kidney disease: Secondary | ICD-10-CM | POA: Diagnosis not present

## 2023-01-01 ENCOUNTER — Encounter: Payer: Self-pay | Admitting: Family Medicine

## 2023-01-01 ENCOUNTER — Other Ambulatory Visit: Payer: Self-pay | Admitting: Physician Assistant

## 2023-01-01 ENCOUNTER — Ambulatory Visit: Payer: Medicare PPO | Admitting: Family Medicine

## 2023-01-01 VITALS — BP 137/59 | HR 53 | Temp 97.6°F | Resp 13 | Ht 68.0 in | Wt 166.3 lb

## 2023-01-01 DIAGNOSIS — E785 Hyperlipidemia, unspecified: Secondary | ICD-10-CM | POA: Diagnosis not present

## 2023-01-01 DIAGNOSIS — E1169 Type 2 diabetes mellitus with other specified complication: Secondary | ICD-10-CM

## 2023-01-01 DIAGNOSIS — E1122 Type 2 diabetes mellitus with diabetic chronic kidney disease: Secondary | ICD-10-CM

## 2023-01-01 DIAGNOSIS — I951 Orthostatic hypotension: Secondary | ICD-10-CM

## 2023-01-01 DIAGNOSIS — Z0000 Encounter for general adult medical examination without abnormal findings: Secondary | ICD-10-CM | POA: Diagnosis not present

## 2023-01-01 DIAGNOSIS — N183 Chronic kidney disease, stage 3 unspecified: Secondary | ICD-10-CM | POA: Diagnosis not present

## 2023-01-01 DIAGNOSIS — E118 Type 2 diabetes mellitus with unspecified complications: Secondary | ICD-10-CM

## 2023-01-01 NOTE — Patient Instructions (Signed)
It was a pleasure to see you today!  Thank you for choosing [facility] for your primary care.   [name] Flavin Beckum Sr. was seen for diabetes.   Our plans for today were: Please continue your medications and start the Rybelsus 7mg  dose  Continue monitoring your blood glucose  Please schedule your diabetes eye exam   To keep you healthy, please keep in mind the following health maintenance items that you are due for:   Tetanus vaccine   Diabetes eye exam  Shingrix Vaccine    You should return to our clinic in 1 month for diabetes.   Best Wishes,   Dr. Roxan Hockey

## 2023-01-01 NOTE — Progress Notes (Signed)
Established patient visit   Patient: [name] Revolorio Sr.   DOB: 06/27/1941   82 y.o. Male  MRN: [id number] Visit Date: 01/01/2023  Today's healthcare provider: [name], MD   Chief Complaint  Patient presents with   Diabetes   Subjective    HPI    Discussed the use of AI scribe software for clinical note transcription with the patient, who gave verbal consent to proceed.  Type 2 diabetes The patient, with a known history of diabetes and kidney issues, presents for follow-up for diabetes medication management. The patient has been on Rybelsus 3mg , Jardiance 25mg , and Glipizide 10 mg daily for diabetes management. Despite medication and dietary changes, the patient's blood sugar levels have been consistently high, around 200. The patient reports no significant changes in diet, consuming regular meals with green beans, pinto beans, and a few potatoes. The patient also drinks a significant amount of water daily, over half a gallon.  Chronic kidney disease  The patient's creatinine levels have been fluctuating, with the most recent reading at 1.9 in May. Patient requested to review results of renal ultrasound as well as labs and recent nephrology visit.An ultrasound of the kidneys was performed, which showed no structural abnormalities. However, the patient's A1c was 9.8 on May 21st, indicating poor blood sugar control.   Medications: Outpatient Medications Prior to Visit  Medication Sig   allopurinol (ZYLOPRIM) 300 MG tablet TAKE ONE TABLET BY MOUTH ONCE DAILY   aspirin 81 MG chewable tablet Chew 81 mg by mouth daily.   atorvastatin (LIPITOR) 20 MG tablet Take 1 tablet (20 mg total) by mouth daily.   BD SHARPS CONTAINER HOME MISC Dispose of syringes after B12 injections   Blood Glucose Monitoring Suppl (ACCU-CHEK AVIVA PLUS) w/Device KIT To check blood sugar once daily   cetirizine (ZYRTEC) 10 MG tablet Take 10 mg by mouth daily. Take 1 tablet daily as  needed for allergies   empagliflozin (JARDIANCE) 25 MG TABS tablet Take 1 tablet (25 mg total) by mouth daily.   ferrous sulfate 325 (65 FE) MG tablet Take 325 mg by mouth daily with breakfast.   furosemide (LASIX) 20 MG tablet Take 1 tablet (20 mg total) by mouth daily.   gabapentin (NEURONTIN) 300 MG capsule TAKE ONE CAPSULE BY MOUTH TWICE DAILY   metoprolol succinate (TOPROL XL) 25 MG 24 hr tablet Take 1 tablet (25 mg total) by mouth in the morning and at bedtime.   nitroGLYCERIN (NITROSTAT) 0.4 MG SL tablet Place 1 tablet (0.4 mg total) under the tongue every 5 (five) minutes as needed for chest pain.   ranolazine (RANEXA) 1000 MG SR tablet TAKE 1 TABLET BY MOUTH 2 TIMES DAILY   Semaglutide (RYBELSUS) 3 MG TABS Take 1 tablet (3 mg total) by mouth daily before breakfast.   Semaglutide (RYBELSUS) 7 MG TABS Take 1 tablet (7 mg total) by mouth daily before breakfast.   tamsulosin (FLOMAX) 0.4 MG CAPS capsule TAKE 1 CAPSULE BY MOUTH ONCE DAILY   [DISCONTINUED] ACCU-CHEK AVIVA PLUS test strip USE AS DIRECTED TO CHECK FASTING BLOOD SUGAR EVERY MORNING   [DISCONTINUED] glipiZIDE (GLUCOTROL XL) 10 MG 24 hr tablet TAKE ONE TABLET BY MOUTH EVERY MORNING WITH BREAKFAST   Facility-Administered Medications Prior to Visit  Medication Dose Route Frequency Provider   sodium chloride flush (NS) 0.9 % injection 3 mL  3 mL Intravenous Q12H Furth, Cadence H, [name]    Review of Systems     Objective  BP (!) 137/59 (BP Location: Left Arm, Patient Position: Sitting, Cuff Size: Normal)   Pulse (!) 53   Temp 97.6 F (36.4 C) (Oral)   Resp 13   Ht 5\' 8"  (1.727 m)   Wt 166 lb 4.8 oz (75.4 kg)   SpO2 98%   BMI 25.29 kg/m    Physical Exam  Physical Exam   CHEST: Lungs clear to auscultation. CARDIOVASCULAR: Heart sounds normal on auscultation. Pulses even on both sides. ABDOMEN: Abdomen non-tender on palpation. EXTREMITIES: No edema in legs.       No results found for any visits on 01/01/23.   Assessment & Plan     Problem List Items Addressed This Visit       Cardiovascular and Mediastinum   Orthostatic hypotension    Patient reports dizziness after standing for 2-3 minutes, which improves with sitting. Currently being managed by cardiology. -Continue follow-up with cardiology.        Endocrine   Type 2 diabetes mellitus with hyperlipidemia (HCC) - Primary    Poorly controlled with fasting blood sugars in the 200s. Patient has been on Rybelsus 3mg  for 29 days and is due to start 7mg . Also on Jardiance 25mg  and Glipizide. A1c was 9.8 on May 21st. Discussed the importance of diet in blood sugar management. -Continue Rybelsus, Jardiance, and Glipizide. -Start Rybelsus 7mg . -Check A1c at next visit. -Encourage adherence to diabetic diet.      CKD stage 3 due to type 2 diabetes mellitus (HCC)    Creatinine increased from 1.64 in February to 1.9 in May. Patient had a normal renal ultrasound. Discussed the importance of avoiding nephrotoxic medications and maintaining good blood sugar control. -Continue current medications. -Avoid nephrotoxic medications.        Other   Healthcare maintenance     -Encourage patient to schedule diabetic eye exam. -Discuss Tetanus and Shingles vaccines with pharmacist.         Return in about 1 month (around 01/31/2023) for diabetes .          [name], MD  [facility] [phone] (phone) [phone] (fax)  [facility]

## 2023-01-01 NOTE — Telephone Encounter (Signed)
Requested Prescriptions  Pending Prescriptions Disp Refills   glipiZIDE (GLUCOTROL XL) 10 MG 24 hr tablet [Pharmacy Med Name: GLIPIZIDE ER 10MG  TABLET ER 24HR] 90 tablet 0    Sig: TAKE ONE TABLET BY MOUTH EVERY MORNING WITH BREAKFAST     Endocrinology:  Diabetes - Sulfonylureas Failed - 01/01/2023 11:36 AM      Failed - HBA1C is between 0 and 7.9 and within 180 days    Hemoglobin A1C  Date Value Ref Range Status  09/25/2022 9.4 (A) 4.0 - 5.6 % Final   Hgb A1c MFr Bld  Date Value Ref Range Status  06/16/2022 7.5 (H) 4.8 - 5.6 % Final    Comment:             Prediabetes: 5.7 - 6.4          Diabetes: >6.4          Glycemic control for adults with diabetes: <7.0          Failed - Cr in normal range and within 360 days    Creat  Date Value Ref Range Status  06/12/2017 1.43 (H) 0.70 - 1.18 mg/dL Final    Comment:    For patients >77 years of age, the reference limit for Creatinine is approximately 13% higher for people identified as African-American. .    Creatinine, Ser  Date Value Ref Range Status  07/30/2022 1.76 (H) 0.61 - 1.24 mg/dL Final         Passed - Valid encounter within last 6 months    Recent Outpatient Visits           Today Type 2 diabetes mellitus with hyperlipidemia (HCC)   [facility] Simmons-Robinson, [place], MD   1 month ago Type 2 diabetes mellitus with hyperlipidemia (HCC)   [place] [facility] Simmons-Robinson, [place], MD   2 months ago Type 2 diabetes mellitus with complication, without long-term current use of insulin (HCC)   [place] [facility] Simmons-Robinson, [place], MD   3 months ago Hypertension associated with diabetes [facility])   [place] [facility] Simmons-Robinson, [place], MD   5 months ago Hyperkalemia   [place] [facility] Alfredia Ferguson, [name]       Future Appointments             In 1 month Simmons-Robinson, Tawanna Cooler, MD [facility], [facility]   In 1 month Sherie Don, NP [place] [place]   In 2 months Gollan, Tollie Pizza, MD [place] [facility] AVIVA PLUS test strip [place] Med Name: ACCU-CHEK AVIVA PLUS AVIVA PL STRIP] 100 strip 0    Sig: USE AS DIRECTED TO CHECK FASTING BLOOD SUGAR EVERY MORNING     Endocrinology: Diabetes - Testing Supplies Passed - 01/01/2023 11:36 AM      Passed - Valid encounter within last 12 months    Recent Outpatient Visits           Today Type 2 diabetes mellitus with hyperlipidemia (HCC)   [place] [facility] Simmons-Robinson, [place], MD   1 month ago Type 2 diabetes mellitus with hyperlipidemia (HCC)   [place] [facility] Simmons-Robinson, [place], MD   2 months ago Type 2 diabetes mellitus with complication, without long-term current use of insulin (HCC)   [place] [facility] Simmons-Robinson, Tawanna Cooler, MD   3  months ago Hypertension associated with diabetes [facility])   [place] [facility] Simmons-Robinson, [place], MD   5 months ago Hyperkalemia   [facility] Alfredia Ferguson, [name]       Future Appointments             In 1 month Simmons-Robinson, Tawanna Cooler, MD [facility], [facility]   In 1 month Sherie Don, NP [place] [facility] [place]   In 2 months Gollan, Tollie Pizza, MD [facility]

## 2023-01-02 DIAGNOSIS — Z0000 Encounter for general adult medical examination without abnormal findings: Secondary | ICD-10-CM | POA: Insufficient documentation

## 2023-01-02 NOTE — Assessment & Plan Note (Signed)
Creatinine increased from 1.64 in February to 1.9 in May. Patient had a normal renal ultrasound. Discussed the importance of avoiding nephrotoxic medications and maintaining good blood sugar control. -Continue current medications. -Avoid nephrotoxic medications.

## 2023-01-02 NOTE — Assessment & Plan Note (Signed)
Poorly controlled with fasting blood sugars in the 200s. Patient has been on Rybelsus 3mg  for 29 days and is due to start 7mg . Also on Jardiance 25mg  and Glipizide. A1c was 9.8 on May 21st. Discussed the importance of diet in blood sugar management. -Continue Rybelsus, Jardiance, and Glipizide. -Start Rybelsus 7mg . -Check A1c at next visit. -Encourage adherence to diabetic diet.

## 2023-01-02 NOTE — Assessment & Plan Note (Signed)
Patient reports dizziness after standing for 2-3 minutes, which improves with sitting. Currently being managed by cardiology. -Continue follow-up with cardiology.

## 2023-01-02 NOTE — Assessment & Plan Note (Signed)
-  Encourage patient to schedule diabetic eye exam. -Discuss Tetanus and Shingles vaccines with pharmacist.

## 2023-01-08 ENCOUNTER — Other Ambulatory Visit: Payer: Self-pay | Admitting: Family Medicine

## 2023-01-08 DIAGNOSIS — M1A9XX0 Chronic gout, unspecified, without tophus (tophi): Secondary | ICD-10-CM

## 2023-01-15 ENCOUNTER — Other Ambulatory Visit: Payer: Self-pay | Admitting: Family Medicine

## 2023-01-15 ENCOUNTER — Other Ambulatory Visit: Payer: Self-pay | Admitting: Medical

## 2023-01-15 DIAGNOSIS — G629 Polyneuropathy, unspecified: Secondary | ICD-10-CM

## 2023-01-15 NOTE — Telephone Encounter (Signed)
Requested medication (s) are due for refill today: No  Requested medication (s) are on the active medication list: Yes  Last refill:  10/14/22  Future visit scheduled:Yes  Notes to clinic:  Protocol indicates lab work needed.    Requested Prescriptions  Pending Prescriptions Disp Refills   gabapentin (NEURONTIN) 300 MG capsule [Pharmacy Med Name: GABAPENTIN 300MG  CAPSULE] 180 capsule 1    Sig: TAKE ONE CAPSULE BY MOUTH TWICE DAILY     Neurology: Anticonvulsants - gabapentin Failed - 01/15/2023 11:50 AM      Failed - Cr in normal range and within 360 days    Creat  Date Value Ref Range Status  06/12/2017 1.43 (H) 0.70 - 1.18 mg/dL Final    Comment:    For patients >92 years of age, the reference limit for Creatinine is approximately 13% higher for people identified as African-American. .    Creatinine, Ser  Date Value Ref Range Status  07/30/2022 1.76 (H) 0.61 - 1.24 mg/dL Final         Passed - Completed PHQ-2 or PHQ-9 in the last 360 days      Passed - Valid encounter within last 12 months    Recent Outpatient Visits           2 weeks ago Type 2 diabetes mellitus with hyperlipidemia (HCC)   [place] [facility] Simmons-Robinson, [place], MD   1 month ago Type 2 diabetes mellitus with hyperlipidemia (HCC)   [place] [facility] Simmons-Robinson, [place], MD   3 months ago Type 2 diabetes mellitus with complication, without long-term current use of insulin (HCC)   [place] [facility] Simmons-Robinson, [place], MD   3 months ago Hypertension associated with diabetes [facility])   [place] [facility] Simmons-Robinson, [place], MD   6 months ago Hyperkalemia   [facility] Alfredia Ferguson, [name]       Future Appointments             In 2 weeks Simmons-Robinson, Tawanna Cooler, MD [facility], [facility]   In 1 month Sherie Don, NP [place] [facility]  [place]   In 2 months Gollan, Tollie Pizza, MD [facility]

## 2023-01-28 ENCOUNTER — Other Ambulatory Visit: Payer: Self-pay | Admitting: Family Medicine

## 2023-01-28 DIAGNOSIS — E1122 Type 2 diabetes mellitus with diabetic chronic kidney disease: Secondary | ICD-10-CM

## 2023-01-28 NOTE — Telephone Encounter (Signed)
Requested medication (s) are due for refill today - yes  Requested medication (s) are on the active medication list -yes  Future visit scheduled -yes  Last refill: 12/04/22 #30  Notes to clinic: off protocol- provider review   Requested Prescriptions  Pending Prescriptions Disp Refills   Semaglutide (RYBELSUS) 7 MG TABS 30 tablet 0    Sig: Take 1 tablet (7 mg total) by mouth daily before breakfast.     Off-Protocol Failed - 01/28/2023 10:18 AM      Failed - Medication not assigned to a protocol, review manually.      Passed - Valid encounter within last 12 months    Recent Outpatient Visits           3 weeks ago Type 2 diabetes mellitus with hyperlipidemia (HCC)   [place] [facility] Simmons-Robinson, [place], MD   2 months ago Type 2 diabetes mellitus with hyperlipidemia (HCC)   [place] [facility] Simmons-Robinson, [place], MD   3 months ago Type 2 diabetes mellitus with complication, without long-term current use of insulin (HCC)   [place] [facility] Simmons-Robinson, [place], MD   4 months ago Hypertension associated with diabetes [facility])   [place] [facility] Simmons-Robinson, [place], MD   6 months ago Hyperkalemia   [place] [facility] Alfredia Ferguson, [name]       Future Appointments             In 1 week Simmons-Robinson, Tawanna Cooler, MD [facility], [facility]   In 2 weeks Sherie Don, NP [place] [facility] at [place]   In 1 month Gollan, Tollie Pizza, MD [place] [facility]               Requested Prescriptions  Pending Prescriptions Disp Refills   Semaglutide (RYBELSUS) 7 MG TABS 30 tablet 0    Sig: Take 1 tablet (7 mg total) by mouth daily before breakfast.     Off-Protocol Failed - 01/28/2023 10:18 AM      Failed - Medication not assigned to a protocol, review manually.      Passed - Valid encounter within last 12 months     Recent Outpatient Visits           3 weeks ago Type 2 diabetes mellitus with hyperlipidemia (HCC)   [place] [facility] Simmons-Robinson, [place], MD   2 months ago Type 2 diabetes mellitus with hyperlipidemia (HCC)   [place] [facility] Simmons-Robinson, [place], MD   3 months ago Type 2 diabetes mellitus with complication, without long-term current use of insulin (HCC)   [place] [facility] Simmons-Robinson, [place], MD   4 months ago Hypertension associated with diabetes [facility])   [place] [facility] Simmons-Robinson, [place], MD   6 months ago Hyperkalemia   [place] [facility] Alfredia Ferguson, [name]       Future Appointments             In 1 week Simmons-Robinson, Tawanna Cooler, MD [facility], [facility]   In 2 weeks Sherie Don, NP  Heart[facility] at [place]   In 1 month Gollan, Tollie Pizza, MD [facility]

## 2023-01-28 NOTE — Telephone Encounter (Signed)
Requested medication (s) are due for refill today - yes  Requested medication (s) are on the active medication list -yes  Future visit scheduled -yes  Last refill: 12/04/22 #30  Notes to clinic: duplicate request- off protocol- provider review   Requested Prescriptions  Pending Prescriptions Disp Refills   RYBELSUS 7 MG TABS [Pharmacy Med Name: RYBELSUS 7MG  TABLET] 30 tablet 0    Sig: TAKE ONE TABLET (7 MG TOTAL) BY MOUTH DAILY BEFORE BREAKFAST.     Off-Protocol Failed - 01/28/2023 11:17 AM      Failed - Medication not assigned to a protocol, review manually.      Passed - Valid encounter within last 12 months    Recent Outpatient Visits           3 weeks ago Type 2 diabetes mellitus with hyperlipidemia (HCC)   [place] [facility] Simmons-Robinson, [place], MD   2 months ago Type 2 diabetes mellitus with hyperlipidemia (HCC)   [place] [facility] Simmons-Robinson, [place], MD   3 months ago Type 2 diabetes mellitus with complication, without long-term current use of insulin (HCC)   [place] [facility] Simmons-Robinson, [place], MD   4 months ago Hypertension associated with diabetes [facility])   [place] Simmons-Robinson, [place], MD   6 months ago Hyperkalemia   [place] [facility] Alfredia Ferguson, [name]       Future Appointments             In 1 week Simmons-Robinson, Tawanna Cooler, MD [facility], [facility]   In 2 weeks Sherie Don, NP [place] [facility] at [place]   In 1 month Gollan, Tollie Pizza, MD [facility]               Requested Prescriptions  Pending Prescriptions Disp Refills   RYBELSUS 7 MG TABS [Pharmacy Med Name: RYBELSUS 7MG  TABLET] 30 tablet 0    Sig: TAKE ONE TABLET (7 MG TOTAL) BY MOUTH DAILY BEFORE BREAKFAST.     Off-Protocol Failed - 01/28/2023 11:17 AM      Failed - Medication not assigned to a  protocol, review manually.      Passed - Valid encounter within last 12 months    Recent Outpatient Visits           3 weeks ago Type 2 diabetes mellitus with hyperlipidemia (HCC)   [place] [facility] Simmons-Robinson, [place], MD   2 months ago Type 2 diabetes mellitus with hyperlipidemia (HCC)   [place] [facility] Simmons-Robinson, [place], MD   3 months ago Type 2 diabetes mellitus with complication, without long-term current use of insulin (HCC)   [place] Simmons-Robinson, [place], MD   4 months ago Hypertension associated with diabetes [facility])   [place] [facility] Simmons-Robinson, [place], MD   6 months ago Hyperkalemia   [facility] Alfredia Ferguson, [name]       Future Appointments             In 1 week Simmons-Robinson, Tawanna Cooler, MD [facility], [facility]   In 2 weeks Sherie Don, NP [place] [facility] at [place]   In 1 month Gollan, Tollie Pizza, MD [facility]

## 2023-01-28 NOTE — Telephone Encounter (Signed)
Copied from CRM [id number]. Topic: General - Other >> Jan 28, 2023  9:50 AM Everette C wrote: Reason for CRM: Medication Refill - Medication: Semaglutide (RYBELSUS) 7 MG TABS [[id number]]  Has the patient contacted their pharmacy? Yes.   (Agent: If no, request that the patient contact the pharmacy for the refill. If patient does not wish to contact the pharmacy document the reason why and proceed with request.) (Agent: If yes, when and what did the pharmacy advise?)  Preferred Pharmacy (with phone number or street name): [facility] [place], [place] - [address] [place] [address] Phone: [phone] Fax: [phone] Hours: Not open 24 hours   Has the patient been seen for an appointment in the last year OR does the patient have an upcoming appointment? Yes.    Agent: Please be advised that RX refills may take up to 3 business days. We ask that you follow-up with your pharmacy.

## 2023-02-03 NOTE — Progress Notes (Unsigned)
      Established patient visit   Patient: [name] Grosz Sr.   DOB: 01-30-1941   82 y.o. Male  MRN: [id number] Visit Date: 02/04/2023  Today's healthcare provider: [name], MD   No chief complaint on file.  Subjective      T2DM Follow up Presents for 1 month follow up with plan to continue Rybelsus, Jardiance, and Glipizide,start Rybelsus 7mg .  Medications: Outpatient Medications Prior to Visit  Medication Sig   allopurinol (ZYLOPRIM) 300 MG tablet TAKE ONE TABLET BY MOUTH ONCE DAILY   aspirin 81 MG chewable tablet Chew 81 mg by mouth daily.   atorvastatin (LIPITOR) 20 MG tablet Take 1 tablet (20 mg total) by mouth daily.   BD SHARPS CONTAINER HOME MISC Dispose of syringes after B12 injections   Blood Glucose Monitoring Suppl (ACCU-CHEK AVIVA PLUS) w/Device KIT To check blood sugar once daily   cetirizine (ZYRTEC) 10 MG tablet Take 10 mg by mouth daily. Take 1 tablet daily as needed for allergies   empagliflozin (JARDIANCE) 25 MG TABS tablet Take 1 tablet (25 mg total) by mouth daily.   ferrous sulfate 325 (65 FE) MG tablet Take 325 mg by mouth daily with breakfast.   furosemide (LASIX) 20 MG tablet TAKE ONE TABLET BY MOUTH ONCE A DAY   gabapentin (NEURONTIN) 300 MG capsule TAKE ONE CAPSULE BY MOUTH TWICE DAILY   glipiZIDE (GLUCOTROL XL) 10 MG 24 hr tablet TAKE ONE TABLET BY MOUTH EVERY MORNING WITH BREAKFAST   glucose blood (ACCU-CHEK AVIVA PLUS) test strip USE AS DIRECTED TO CHECK FASTING BLOOD SUGAR EVERY MORNING   metoprolol succinate (TOPROL XL) 25 MG 24 hr tablet Take 1 tablet (25 mg total) by mouth in the morning and at bedtime.   nitroGLYCERIN (NITROSTAT) 0.4 MG SL tablet Place 1 tablet (0.4 mg total) under the tongue every 5 (five) minutes as needed for chest pain.   ranolazine (RANEXA) 1000 MG SR tablet TAKE 1 TABLET BY MOUTH 2 TIMES DAILY   RYBELSUS 7 MG TABS TAKE ONE TABLET (7 MG TOTAL) BY MOUTH DAILY BEFORE BREAKFAST.   Semaglutide  (RYBELSUS) 3 MG TABS Take 1 tablet (3 mg total) by mouth daily before breakfast.   tamsulosin (FLOMAX) 0.4 MG CAPS capsule TAKE 1 CAPSULE BY MOUTH ONCE DAILY   Facility-Administered Medications Prior to Visit  Medication Dose Route Frequency Provider   sodium chloride flush (NS) 0.9 % injection 3 mL  3 mL Intravenous Q12H Furth, Cadence H, [name]    Review of Systems  {Labs  Heme  Chem  Endocrine  Serology  Results Review (optional):23779}   Objective    There were no vitals taken for this visit. {Show previous vital signs (optional):23777}  Physical Exam  ***  No results found for any visits on 02/04/23.  Assessment & Plan     Problem List Items Addressed This Visit   None    No follow-ups on file.         [name], MD  [facility] [phone] (phone) [phone] (fax)  [facility]

## 2023-02-04 ENCOUNTER — Encounter: Payer: Self-pay | Admitting: Family Medicine

## 2023-02-04 ENCOUNTER — Ambulatory Visit: Payer: Medicare PPO | Admitting: Family Medicine

## 2023-02-04 VITALS — BP 116/70 | HR 61 | Temp 97.5°F | Resp 13 | Ht 68.0 in | Wt 160.6 lb

## 2023-02-04 DIAGNOSIS — N1831 Chronic kidney disease, stage 3a: Secondary | ICD-10-CM

## 2023-02-04 DIAGNOSIS — E1169 Type 2 diabetes mellitus with other specified complication: Secondary | ICD-10-CM

## 2023-02-04 DIAGNOSIS — N401 Enlarged prostate with lower urinary tract symptoms: Secondary | ICD-10-CM | POA: Diagnosis not present

## 2023-02-04 DIAGNOSIS — Z0000 Encounter for general adult medical examination without abnormal findings: Secondary | ICD-10-CM

## 2023-02-04 DIAGNOSIS — I152 Hypertension secondary to endocrine disorders: Secondary | ICD-10-CM | POA: Diagnosis not present

## 2023-02-04 DIAGNOSIS — E118 Type 2 diabetes mellitus with unspecified complications: Secondary | ICD-10-CM

## 2023-02-04 DIAGNOSIS — I25718 Atherosclerosis of autologous vein coronary artery bypass graft(s) with other forms of angina pectoris: Secondary | ICD-10-CM

## 2023-02-04 DIAGNOSIS — E782 Mixed hyperlipidemia: Secondary | ICD-10-CM | POA: Diagnosis not present

## 2023-02-04 DIAGNOSIS — E785 Hyperlipidemia, unspecified: Secondary | ICD-10-CM | POA: Diagnosis not present

## 2023-02-04 DIAGNOSIS — E1159 Type 2 diabetes mellitus with other circulatory complications: Secondary | ICD-10-CM

## 2023-02-04 DIAGNOSIS — R3911 Hesitancy of micturition: Secondary | ICD-10-CM

## 2023-02-04 MED ORDER — EMPAGLIFLOZIN 25 MG PO TABS: 25.0000 mg | ORAL_TABLET | Freq: Every day | ORAL | 1 refills | Status: DC

## 2023-02-04 MED ORDER — TAMSULOSIN HCL 0.4 MG PO CAPS: 0.4000 mg | ORAL_CAPSULE | Freq: Every day | ORAL | 3 refills | Status: DC

## 2023-02-04 MED ORDER — ATORVASTATIN CALCIUM 20 MG PO TABS: 20.0000 mg | ORAL_TABLET | Freq: Every day | ORAL | 3 refills | Status: DC

## 2023-02-04 NOTE — Assessment & Plan Note (Signed)
Chronic  Recheck BMP for creatinine levels  Recommended continued guideline directed therapy  Follows with nephrology, next appt 8.27.24, Dr. Suezanne Jacquet

## 2023-02-04 NOTE — Assessment & Plan Note (Signed)
Blood pressure well controlled on current regimen. No reported symptoms of heart failure. -Chronic  -stable  -follows with cardiology, continued follow up as scheduled  -Continue Metoprolol 25mg  daily and Lasix 20mg  daily.

## 2023-02-04 NOTE — Assessment & Plan Note (Signed)
Recommended patient schedule DM eye exam annually

## 2023-02-04 NOTE — Assessment & Plan Note (Signed)
No reported issues with Atorvastatin 20mg  daily. -Chronic  -lipids last within goal range  -Refill Atorvastatin 20mg  daily.

## 2023-02-04 NOTE — Assessment & Plan Note (Signed)
No reported urinary issues on Flomax 0.4mg  daily. -Chronic, stable on current regimen -Refill Flomax 0.4mg  daily.

## 2023-02-04 NOTE — Assessment & Plan Note (Signed)
Patient reports improved home glucose readings, ranging from 100-130 mg/dL, with occasional spikes related to dietary indiscretions. Tolerating increased dose of Rybelsus 7mg  daily without gastrointestinal side effects. -Chronic -last A1c not at goal at 9.8 (nephrology checked)  -Check A1C today to assess overall glycemic control. -Continue current regimen of Glipizide 10mg  daily, Jardiance 25mg  daily, and Rybelsus 7mg  daily.

## 2023-02-05 LAB — BASIC METABOLIC PANEL
BUN/Creatinine Ratio: 13 (ref 10–24)
BUN: 26 mg/dL (ref 8–27)
CO2: 21 mmol/L (ref 20–29)
Calcium: 9.6 mg/dL (ref 8.6–10.2)
Chloride: 103 mmol/L (ref 96–106)
Creatinine, Ser: 2.05 mg/dL — ABNORMAL HIGH (ref 0.76–1.27)
Glucose: 198 mg/dL — ABNORMAL HIGH (ref 70–99)
Potassium: 5.4 mmol/L — ABNORMAL HIGH (ref 3.5–5.2)
Sodium: 139 mmol/L (ref 134–144)
eGFR: 32 mL/min/{1.73_m2} — ABNORMAL LOW (ref >59)

## 2023-02-05 LAB — HEMOGLOBIN A1C
Est. average glucose Bld gHb Est-mCnc: 212 mg/dL
Hgb A1c MFr Bld: 9 % — ABNORMAL HIGH (ref 4.8–5.6)

## 2023-02-16 NOTE — Progress Notes (Unsigned)
Cardiology Office Note Date:  02/16/2023  Patient ID:  Covey Baller Sr., DOB 1940/11/05, MRN [id number] PCP:  [name], MD  Cardiologist:  Julien Nordmann, MD Electrophysiologist: Lanier Prude, MD  ***refresh   Chief Complaint: PVC, SVT follow-up  History of Present Illness: Tahjae Hendrick Lythgoe Sr. is a 82 y.o. male with PMH notable for diastolic HF, CAD s/p CABG, HTN, T2DM, CKD 3, SVT, PVCs; seen today for Lanier Prude, MD for routine electrophysiology followup.  He saw Dr. Lalla Brothers 11/2022 for consult SVT and PVCs.  He had intermittent fluttering in his chest and some central chest pain.  Toprol 25 mg twice daily was started, did not favor AAD given conduction system disease on EKG.   ON follow-up today, ***  *** any palpitation improvement with toprol? *** HF symptoms *** cad   Since last being seen in our clinic the patient reports doing ***.  he denies chest pain, palpitations, dyspnea, PND, orthopnea, nausea, vomiting, dizziness, syncope, edema, weight gain, or early satiety.     Past Medical History:  Diagnosis Date   Arthritis    Back pain    hx   CAD (coronary artery disease)    a. 2004 s/p CABG x 4 (LIMA->LAD, VG->Diag, VG->OM, VG->RCA); b. 2007 Cath: VG->Diag occluded, otw 3/4 patent grafts.   CKD (chronic kidney disease), stage II-III    Colon polyps    DM2 (diabetes mellitus, type 2) (HCC)    stable   GERD (gastroesophageal reflux disease)    Glaucoma    Gout    HLD (hyperlipidemia)    HTN (hypertension)    Neuropathy     Past Surgical History:  Procedure Laterality Date   BACK SURGERY     Cyst removed   CORONARY ARTERY BYPASS GRAFT  2004   CORONARY STENT INTERVENTION N/A 09/24/2021   Procedure: CORONARY STENT INTERVENTION;  Surgeon: Yvonne Kendall, MD;  Location: [facility] INVASIVE CV LAB;  Service: Cardiovascular;  Laterality: N/A;   KNEE SURGERY     LEFT HEART CATH AND CORONARY ANGIOGRAPHY Left 09/24/2021    Procedure: LEFT HEART CATH AND CORONARY ANGIOGRAPHY;  Surgeon: Yvonne Kendall, MD;  Location: [facility] INVASIVE CV LAB;  Service: Cardiovascular;  Laterality: Left;    Current Outpatient Medications  Medication Instructions   allopurinol (ZYLOPRIM) 300 mg, Oral, Daily   aspirin 81 mg, Oral, Daily   atorvastatin (LIPITOR) 20 mg, Oral, Daily   BD SHARPS CONTAINER HOME MISC Dispose of syringes after B12 injections   Blood Glucose Monitoring Suppl (ACCU-CHEK AVIVA PLUS) w/Device KIT To check blood sugar once daily   cetirizine (ZYRTEC) 10 mg, Oral, Daily, Take 1 tablet daily as needed for allergies   empagliflozin (JARDIANCE) 25 mg, Oral, Daily   ferrous sulfate 325 mg, Oral, Daily with breakfast   furosemide (LASIX) 20 mg, Oral, Daily   gabapentin (NEURONTIN) 300 mg, Oral, 2 times daily   glipiZIDE (GLUCOTROL XL) 10 MG 24 hr tablet TAKE ONE TABLET BY MOUTH EVERY MORNING WITH BREAKFAST   glucose blood (ACCU-CHEK AVIVA PLUS) test strip USE AS DIRECTED TO CHECK FASTING BLOOD SUGAR EVERY MORNING   metoprolol succinate (TOPROL XL) 25 mg, Oral, 2 times daily   nitroGLYCERIN (NITROSTAT) 0.4 mg, Sublingual, Every 5 min PRN   ranolazine (RANEXA) 1,000 mg, Oral, 2 times daily   RYBELSUS 7 MG TABS TAKE ONE TABLET (7 MG TOTAL) BY MOUTH DAILY BEFORE BREAKFAST.   Rybelsus 3 mg, Oral, Daily before breakfast   tamsulosin (FLOMAX) 0.4 mg,  Oral, Daily    Social History:  The patient  reports that he has never smoked. His smokeless tobacco use includes chew. He reports that he does not drink alcohol and does not use drugs.   Family History:  *** include only if pertinent The patient's family history includes Stroke in his mother.***  ROS:  Please see the history of present illness. All other systems are reviewed and otherwise negative.   PHYSICAL EXAM: *** VS:  There were no vitals taken for this visit. BMI: There is no height or weight on file to calculate BMI.  GEN- The patient is well appearing, alert  and oriented x 3 today.   Lungs- Clear to ausculation bilaterally, normal work of breathing.  Heart- {Blank single:19197::"Regular","Irregularly irregular"} rate and rhythm, no murmurs, rubs or gallops Extremities- {EDEMA LEVEL:28147::"No"} peripheral edema, warm, dry   EKG is ordered. Personal review of EKG from today shows:  ***        Recent Labs: 06/16/2022: ALT 52 07/02/2022: B Natriuretic Peptide 121.5; Magnesium 2.1 07/30/2022: Hemoglobin 12.3; Platelets 237 02/04/2023: BUN 26; Creatinine, Ser 2.05; Potassium 5.4; Sodium 139  No results found for requested labs within last 365 days.   Estimated Creatinine Clearance: 27.3 mL/min (A) (by C-G formula based on SCr of 2.05 mg/dL (H)).   Wt Readings from Last 3 Encounters:  02/04/23 160 lb 9.6 oz (72.8 kg)  01/01/23 166 lb 4.8 oz (75.4 kg)  12/17/22 161 lb 6 oz (73.2 kg)     Additional studies reviewed include: Previous EP, cardiology notes.   Long term monitor, 09/09/2022 Normal sinus rhythm Patient had a min HR of 47 bpm, max HR of 150 bpm, and avg HR of 72 bpm.   1 run of Ventricular Tachycardia occurred lasting 4 beats with a max rate of 150 bpm (avg 142 bpm).   164 Supraventricular Tachycardia runs occurred, the run with the fastest interval lasting 4 beats with a max rate of 138 bpm, the longest lasting 5 mins 44 secs with an avg rate of 106 bpm.   Isolated SVEs were occasional (2.5%, 16109), SVE Couplets were rare (<1.0%, 4695), and SVE Triplets were rare (<1.0%, 1259).   Isolated VEs were frequent (11.1%, [id number]), VE Couplets were rare (<1.0%, 3136), and VE Triplets were rare (<1.0%, 2). Ventricular Bigeminy and Trigeminy were present.  Patient triggered events (4) with normal sinus rhythm, PACs, PVCs  TTE, 06/18/2022  1. Left ventricular ejection fraction, by estimation, is 45 to 50%. The left ventricle has mildly decreased function. The left ventricle demonstrates global hypokinesis. The left ventricular internal cavity  size was mildly dilated. Left ventricular diastolic parameters are consistent with Grade I diastolic dysfunction (impaired relaxation).   2. Right ventricular systolic function is mildly reduced. The right ventricular size is normal. There is normal pulmonary artery systolic pressure.   3. Left atrial size was mildly dilated.   4. The mitral valve is grossly normal. No evidence of mitral valve regurgitation. No evidence of mitral stenosis.   5. The aortic valve is tricuspid. There is moderate calcification of the aortic valve. Aortic valve regurgitation is not visualized. Aortic valve sclerosis/calcification is present, without any evidence of aortic stenosis.   6. The inferior vena cava is dilated in size with >50% respiratory variability, suggesting right atrial pressure of 8 mmHg.  Comparison(s): Prior images reviewed side by side. LV is slightly dilated   TTE, 02/19/2022  1. Left ventricular ejection fraction, by estimation, is 50 to 55%. The left ventricle  has low normal function. The left ventricle has no regional wall motion abnormalities. There is mild left ventricular hypertrophy. Left ventricular diastolic parameters are consistent with Grade III diastolic dysfunction (restrictive). Elevated left atrial pressure. The average left ventricular global longitudinal strain is -12.3 %. The global longitudinal strain is abnormal.   2. Right ventricular systolic function is mildly reduced. The right ventricular size is normal. There is moderately elevated pulmonary artery systolic pressure. The estimated right ventricular systolic pressure is 49.1 mmHg.   3. Left atrial size was moderately dilated.   4. Right atrial size was moderately dilated.   5. The mitral valve is normal in structure. Mild mitral valve regurgitation. No evidence of mitral stenosis.   6. Tricuspid valve regurgitation is mild to moderate.   7. The aortic valve is tricuspid. There is mild calcification of the aortic valve. There is  mild thickening of the aortic valve. Aortic valve regurgitation is not visualized. Aortic valve sclerosis/calcification is present, without any evidence of aortic stenosis.   8. Aortic dilatation noted. There is borderline dilatation of the aortic root, measuring 39 mm. There is borderline dilatation of the aortic arch, measuring 31 mm.   9. The inferior vena cava is dilated in size with <50% respiratory variability, suggesting right atrial pressure of 15 mmHg.   Comparison(s): EF 50%, hypokinesis of the basal septal, anteroseptal, inferior/inferoseptal regions, RVSP 44.30mmHg.   ASSESSMENT AND PLAN:  #) SVT #) PVC 25mg  toprol BID recently added   #) ***   {Are you ordering a CV Procedure (e.g. stress test, cath, DCCV, TEE, etc)?   Press F2        :[id number]}   Current medicines are reviewed at length with the patient today.   The patient {ACTIONS; HAS/DOES NOT HAVE:19233} concerns regarding his medicines.  The following changes were made today:  {NONE DEFAULTED:18576}  Labs/ tests ordered today include: *** No orders of the defined types were placed in this encounter.    Disposition: Follow up with {EPMDS:28135} or EP APP {EPFOLLOW UP:28173}   Signed, Sherie Don, NP  02/16/23  8:21 AM  Electrophysiology [facility]

## 2023-02-17 ENCOUNTER — Encounter: Payer: Self-pay | Admitting: Cardiology

## 2023-02-17 ENCOUNTER — Ambulatory Visit: Payer: Medicare PPO | Attending: Cardiology | Admitting: Cardiology

## 2023-02-17 VITALS — BP 120/70 | HR 54 | Ht 68.0 in | Wt 165.0 lb

## 2023-02-17 DIAGNOSIS — I10 Essential (primary) hypertension: Secondary | ICD-10-CM | POA: Diagnosis not present

## 2023-02-17 DIAGNOSIS — I5022 Chronic systolic (congestive) heart failure: Secondary | ICD-10-CM

## 2023-02-17 DIAGNOSIS — I471 Supraventricular tachycardia, unspecified: Secondary | ICD-10-CM | POA: Diagnosis not present

## 2023-02-17 DIAGNOSIS — I493 Ventricular premature depolarization: Secondary | ICD-10-CM

## 2023-02-17 DIAGNOSIS — I25118 Atherosclerotic heart disease of native coronary artery with other forms of angina pectoris: Secondary | ICD-10-CM

## 2023-02-17 NOTE — Patient Instructions (Signed)
Medication Instructions:  Your physician recommends that you continue on your current medications as directed. Please refer to the Current Medication list given to you today.  *If you need a refill on your cardiac medications before your next appointment, please call your pharmacy*   Lab Work: No labs ordered  If you have labs (blood work) drawn today and your tests are completely normal, you will receive your results only by: MyChart Message (if you have MyChart) OR A paper copy in the mail If you have any lab test that is abnormal or we need to change your treatment, we will call you to review the results.   Testing/Procedures: No testing ordered   Follow-Up: At [facility], you and your health needs are our priority.  As part of our continuing mission to provide you with exceptional heart care, we have created designated Provider Care Teams.  These Care Teams include your primary Cardiologist (physician) and Advanced Practice Providers (APPs -  Physician Assistants and Nurse Practitioners) who all work together to provide you with the care you need, when you need it.  We recommend signing up for the patient portal called "MyChart".  Sign up information is provided on this After Visit Summary.  MyChart is used to connect with patients for Virtual Visits (Telemedicine).  Patients are able to view lab/test results, encounter notes, upcoming appointments, etc.  Non-urgent messages can be sent to your provider as well.   To learn more about what you can do with MyChart, go to [email or web address].    Your next appointment:   3 month(s)  Provider:   Sherie Don, NP

## 2023-02-26 ENCOUNTER — Other Ambulatory Visit: Payer: Self-pay | Admitting: Family Medicine

## 2023-02-26 DIAGNOSIS — N1832 Chronic kidney disease, stage 3b: Secondary | ICD-10-CM

## 2023-03-04 ENCOUNTER — Other Ambulatory Visit: Payer: Medicare PPO | Admitting: Pharmacist

## 2023-03-04 NOTE — Patient Instructions (Signed)
Goals Addressed             This Visit's Progress    Pharmacy Goals       Our goal A1c is less than 7%. This corresponds with fasting sugars less than 130 and 2 hour after meal sugars less than 180. Please keep a log of your results when checking your blood sugar   Our goal bad cholesterol, or LDL, is less than 70. This is why it is important to continue taking your atorvastatin.  Please watch the mail for an envelope from [employer] containing the patient assistance program application. Please complete this application and mail back to [facility] Pharmacy Technician Noreene Larsson Simcox along with a copy of your Medicare Part D prescription card and a copy of your proof of income document.  Estelle Grumbles, PharmD, [facility] [phone]

## 2023-03-04 NOTE — Progress Notes (Signed)
03/04/2023 Name: [name] [facility] Sr. MRN: [id number] DOB: 1941/05/30  Chief Complaint  Patient presents with   Medication Management   Medication Assistance    Bezalel Senn Rives Sr. is a 82 y.o. year old male who presented for a telephone visit.   They were referred to the pharmacist by their PCP for assistance in managing diabetes and complex medication management.   Unable to complete medication review today or discuss other medical conditions as patient reports that he is about to have breakfast and would prefer to continue this conversation/review medications during our next call.  Subjective:  Care Team: Primary Care Provider: [name], MD ; Next Scheduled Visit: 04/07/2023 Cardiologist: Julien Nordmann, MD; Next Scheduled Visit: 03/27/2023 Electrophysiologist: Steffanie Dunn, MD Nephrologist: Lorain Childes, MD; Next Scheduled Visit: 03/24/2023  Medication Access/Adherence  Current Pharmacy:  [facility] - [place], [place] - [address] [place] [address] Phone: [phone] Fax: [phone]  [facility] [address] [place] [address] Phone: [phone] Fax: [phone]  [id number] - [place] [facility] - [address] [place] [address] Phone: [phone] Fax: [phone]   Patient reports affordability concerns with their medications: Yes  Patient reports access/transportation concerns to their pharmacy: No  Patient reports adherence concerns with their medications:  No    Reports copayments for Jardiance and Rybelsus are difficult to afford  Diabetes:  Current medications:  - Jardiance 25 mg daily - glipizide ER 10 mg daily with breakfast - Rybelsus 7 mg daily before breakfast   Reports tolerating well Confirms takes on an empty stomach, ?30 minutes before the first food, beverage, or other oral medications of the day  with ?4 oz of plain water only   Medications tried in the past: metformin (CKD)  Current glucose readings: morning fasting ranging: 120-155  Patient denies hypoglycemic s/sx including dizziness, shakiness, sweating.   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  Current medication access support: none   Objective:  Lab Results  Component Value Date   HGBA1C 9.0 (H) 02/04/2023    Lab Results  Component Value Date   CREATININE 2.05 (H) 02/04/2023   BUN 26 02/04/2023   NA 139 02/04/2023   K 5.4 (H) 02/04/2023   CL 103 02/04/2023   CO2 21 02/04/2023    Lab Results  Component Value Date   CHOL 114 05/16/2021   HDL 35 (L) 05/16/2021   LDLCALC 59 05/16/2021   TRIG 104 05/16/2021   CHOLHDL 3.3 05/16/2021   BP Readings from Last 3 Encounters:  02/17/23 120/70  02/04/23 116/70  01/01/23 (!) 137/59   Pulse Readings from Last 3 Encounters:  02/17/23 (!) 54  02/04/23 61  01/01/23 (!) 53    Medications Reviewed Today     Reviewed by Manuela Neptune, RPH-CPP (Pharmacist) on 03/04/23 at 2231  Med List Status: <None>   Medication Order Taking? Sig Documenting Provider Last Dose Status Informant  allopurinol (ZYLOPRIM) 300 MG tablet [id number]  TAKE ONE TABLET BY MOUTH ONCE DAILY Simmons-Robinson, Tawanna Cooler, MD  Active  aspirin 81 MG chewable tablet [id number]  Chew 81 mg by mouth daily. [provider]  Active Child  atorvastatin (LIPITOR) 20 MG tablet [id number] Yes Take 1 tablet (20 mg total) by mouth daily. [name], MD Taking Active   BD SHARPS [facility] MISC  [id number]  Dispose of syringes after B12 injections Margaretann Loveless, [place]  Active Child  Blood Glucose Monitoring Suppl (ACCU-CHEK AVIVA PLUS) w/Device KIT [id number]  To check blood sugar once daily Joycelyn Man M, [place]  Active Child  cetirizine (ZYRTEC) 10 MG tablet [id number]  Take 10 mg by mouth daily. Take 1 tablet daily as needed for allergies [provider]  Active Child  empagliflozin (JARDIANCE) 25 MG TABS tablet [id number] Yes Take 1 tablet (25 mg total) by mouth daily. Simmons-Robinson, Makiera, MD Taking Active   ferrous sulfate 325 (65 FE) MG tablet [id number]  Take 325 mg by mouth daily with breakfast. [provider]  Active   furosemide (LASIX) 20 MG tablet [id number]  TAKE ONE TABLET BY MOUTH ONCE A DAY Furth, Cadence H, [name]  Active   gabapentin (NEURONTIN) 300 MG capsule [id number]  TAKE ONE CAPSULE BY MOUTH TWICE DAILY Ostwalt, Janna, [name]  Active   glipiZIDE (GLUCOTROL XL) 10 MG 24 hr tablet [id number] Yes TAKE ONE TABLET BY MOUTH EVERY MORNING WITH BREAKFAST Simmons-Robinson, Makiera, MD Taking Active   glucose blood (ACCU-CHEK AVIVA PLUS) test strip [id number]  USE AS DIRECTED TO CHECK FASTING BLOOD SUGAR EVERY MORNING Simmons-Robinson, Makiera, MD  Active   metoprolol succinate (TOPROL XL) 25 MG 24 hr tablet [id number]  Take 1 tablet (25 mg total) by mouth in the morning and at bedtime. Lanier Prude, MD  Active   nitroGLYCERIN (NITROSTAT) 0.4 MG SL tablet [id number]  Place 1 tablet (0.4 mg total) under the tongue every 5 (five) minutes as needed for chest pain. Fransico Michael, Cadence H, [name]  Active Child           Med Note Wyline Mood, PHILICIA R   Wed Jul 02, 2022  8:49 AM)    ranolazine (RANEXA) 1000 MG SR tablet [id number]  TAKE 1 TABLET BY MOUTH 2 TIMES DAILY Gollan, Tollie Pizza, MD  Active   Semaglutide (RYBELSUS) 7 MG TABS [id number] Yes TAKE ONE TABLET (7 MG TOTAL) BY MOUTH DAILY BEFORE BREAKFAST. Simmons-Robinson, Makiera, MD Taking Active   sodium chloride  flush (NS) 0.9 % injection 3 mL [id number]   Fransico Michael, Cadence H, [name]  Active   tamsulosin (FLOMAX) 0.4 MG CAPS capsule [id number]  Take 1 capsule (0.4 mg total) by mouth daily. Simmons-Robinson, Makiera, MD  Active               Assessment/Plan:   Unable to complete medication review today or discuss other medical conditions as patient reports that he is about to have breakfast and would prefer to continue this conversation/review medications during our next call.  Diabetes: - Currently uncontrolled - Reviewed long term cardiovascular and renal outcomes of uncontrolled blood sugar - Reviewed goal A1c, goal fasting, and goal 2 hour post prandial glucose - Reviewed dietary modifications including:  Importance of having regular well-balanced meals and snacks   Encourage patient to avoid sugary beverages, such as semi-sweet tea Discuss ideas for more balanced carbohydrate-protein snacks - Recommend to check glucose, keep log of results and have this record to review at upcoming medical appointments. Patient to contact provider office sooner if needed for readings outside of established parameters or symptoms - Meets financial criteria for  Rybelsus patient assistance program through [employer]. Will collaborate with provider, [facility] CPhT, and patient to pursue assistance.  - Review with patient the income limit for the patient assistance program for Jardiance from [facility] Patient states that he will have to review his financial documents to determine if he meets this criteria and then will call to let Clinical Pharmacist know Note if unable to apply for Jardiance patient assistance, could consider patient assistance for therapeutic alternative or check to see if assistance available through foundation grant   Follow Up Plan: Clinical Pharmacist will follow up with patient by telephone on 04/15/2023 at 10 am  Estelle Grumbles, PharmD, [facility] [phone]

## 2023-03-12 ENCOUNTER — Telehealth: Payer: Self-pay | Admitting: Pharmacy Technician

## 2023-03-12 DIAGNOSIS — Z5986 Financial insecurity: Secondary | ICD-10-CM

## 2023-03-12 NOTE — Progress Notes (Signed)
[facility] Customer service manager [facility])                                            [facility] Quality Pharmacy Team    03/12/2023  [name] Formoso Sr. [birth year] [id number]                                      Medication Assistance Referral  Referral From:  [facility] PharmD Estelle Grumbles  Medication/Company: Rybelsus / [employer] Patient application portion:  Mining engineer portion: Faxed  to Dr. [name] Provider address/fax verified via: Office website  [name], CPhT [place]  [facility] Office: [phone] Fax: [phone] Email: [email or web address]

## 2023-03-23 NOTE — Progress Notes (Signed)
Cardiology Office Note  Date:  03/27/2023   ID:  [name] Swan Sr., DOB 1941-06-20, MRN [id number]  PCP:  [name], MD   Chief Complaint  Patient presents with   4 month follow up     "Doing great!" Medications reviewed by the patient verbally.      HPI:  [name] is a very pleasant 82 year old gentleman with  coronary artery disease,  bypass surgery in 2004,  occluded graft to the diagonal branch with 3 other patent grafts to the RCA, OM with a LIMA to the LAD by catheterization in 2007  Chronic renal insufficiency who presents for routine followup Of his coronary artery disease .   Last seen in clinic January 2024 Presents with family on today's visit  Active at baseline working in his garden On discussion of his elevated A1c, Lots of bread, potato Weight relatively stable  Denies significant shortness of breath on Lasix 20 mg daily On higher dose Lasix had orthostasis, near syncope and syncope  Lab work reviewed Creatinine 2.08 BUN 26 A1c trending higher 9.0 up from 7 A1c through 2024 in the 9 range  No recent lipid panel, last in 2022, was at goal at that time Reports compliance with his Lipitor  EKG personally reviewed by myself on todays visit EKG Interpretation Date/Time:  Friday March 27 2023 95:62:13 EDT Ventricular Rate:  55 PR Interval:  198 QRS Duration:  154 QT Interval:  482 QTC Calculation: 461 R Axis:   79  Text Interpretation: Sinus bradycardia Right bundle branch block When compared with ECG of 17-Feb-2023 08:52, Premature ventricular complexes are no longer Present Confirmed by Julien Nordmann [phone]) on 03/27/2023 8:36:00 AM   Seen by Dr. Eldridge Dace 02/2022 and CTO PCI was felt be high risk, medical management recommended   admitted 05/2022 with heart failure.  Echo showed LVEF 45-50%, RV mildly reduced.  diuresed 8L w/ IV lasix.  GDMT Was limited by CKD. Jardiance was added. D/c weight was 166lbs.   He saw  Advanced Heart failure 07/02/22 and was overall stable.   He was admitted later that day on 12/6 with hyperkalemia with K>7.5. He was taking potassium BID instead of daily.Klor-con was stopped. He was also treated for AKI with Scr up to 2.2. Metformin was stopped for AKI.  Seen in the emergency room July 30, 2022, near syncope Denies loss of consciousness, was too weak to get up off the ground Was told at the scene by EMTs his blood pressure and heart rate were too low Multiple episodes like this over the past few months, near syncope/syncope Blood pressure 129/40 heart rate 61 in the ER Creatinine 1.76 sodium 130  Other past medical history reviewed Cardiac catheterization 12/22/2021 for angina Multivessel coronary artery disease including severe diffuse LAD and LCx disease.  There is also a short, chronic total occlusion of the mid RCA with bridging and left-right collaterals. Widely patent LIMA-LAD and sequential SVG-OM1-OM2. Chronically occluded SVG-D1. Normal left ventricular contraction (LVEF 55-65%) with normal filling pressure (LVEDP 10 mmHg). Unsuccessful attempted PCI to mid RCA due to inability to cross the lesion using Runthrough and Fielder XT wires (including with balloon support).   Medication changes made: isosorbide mononitrate to 120 mg daily.    emergency room after syncope episode Dec 15, 2019 [place], felt sugar was low, got clammy and lightheaded went into the house checked his sugar it was 43, ate a sandwich, [facility], started feeling better 1 hour  later began lightheaded and clammy, had brief syncopal episode lasting several seconds, witnessed by his son  creatinine 1.46 hemoglobin 11 glucose 268  Echocardiogram February 01, 2020 ejection fraction 50 to 55%, Grade 2 diastolic dysfunction, right heart pressures 45 mmHg, hypokinesis basal inferoseptal wall  Stress test August 02, 2019 Ischemia in the basal and mid inferior and  inferoseptal myocardium   PMH:   has a past medical history of Arthritis, Back pain, CAD (coronary artery disease), CKD (chronic kidney disease), stage II-III, Colon polyps, DM2 (diabetes mellitus, type 2) (HCC), GERD (gastroesophageal reflux disease), Glaucoma, Gout, HLD (hyperlipidemia), HTN (hypertension), and Neuropathy.  PSH:    Past Surgical History:  Procedure Laterality Date   BACK SURGERY     Cyst removed   CORONARY ARTERY BYPASS GRAFT  2004   CORONARY STENT INTERVENTION N/A 09/24/2021   Procedure: CORONARY STENT INTERVENTION;  Surgeon: Yvonne Kendall, MD;  Location: [facility] INVASIVE CV LAB;  Service: Cardiovascular;  Laterality: N/A;   KNEE SURGERY     LEFT HEART CATH AND CORONARY ANGIOGRAPHY Left 09/24/2021   Procedure: LEFT HEART CATH AND CORONARY ANGIOGRAPHY;  Surgeon: Yvonne Kendall, MD;  Location: [facility] INVASIVE CV LAB;  Service: Cardiovascular;  Laterality: Left;    Current Outpatient Medications  Medication Sig Dispense Refill   acetaminophen (TYLENOL) 650 MG CR tablet Take 650 mg by mouth every 8 (eight) hours as needed.     allopurinol (ZYLOPRIM) 300 MG tablet TAKE ONE TABLET BY MOUTH ONCE DAILY 90 tablet 0   aspirin 81 MG chewable tablet Chew 81 mg by mouth daily.     atorvastatin (LIPITOR) 20 MG tablet Take 1 tablet (20 mg total) by mouth daily. 90 tablet 3   BD SHARPS CONTAINER HOME MISC Dispose of syringes after B12 injections 1 each 5   Blood Glucose Monitoring Suppl (ACCU-CHEK AVIVA PLUS) w/Device KIT To check blood sugar once daily 1 kit 0   cetirizine (ZYRTEC) 10 MG tablet Take 10 mg by mouth daily. Take 1 tablet daily as needed for allergies     empagliflozin (JARDIANCE) 25 MG TABS tablet Take 1 tablet (25 mg total) by mouth daily. 90 tablet 1   ferrous sulfate 325 (65 FE) MG tablet Take 325 mg by mouth daily with breakfast.     furosemide (LASIX) 20 MG tablet TAKE ONE TABLET BY MOUTH ONCE A DAY 90 tablet 3   gabapentin (NEURONTIN) 300 MG capsule TAKE ONE  CAPSULE BY MOUTH TWICE DAILY 180 capsule 1   glipiZIDE (GLUCOTROL XL) 10 MG 24 hr tablet TAKE ONE TABLET BY MOUTH EVERY MORNING WITH BREAKFAST 90 tablet 0   glucose blood (ACCU-CHEK AVIVA PLUS) test strip USE AS DIRECTED TO CHECK FASTING BLOOD SUGAR EVERY MORNING 100 strip 0   metoprolol succinate (TOPROL XL) 25 MG 24 hr tablet Take 1 tablet (25 mg total) by mouth in the morning and at bedtime. 180 tablet 3   nitroGLYCERIN (NITROSTAT) 0.4 MG SL tablet Place 1 tablet (0.4 mg total) under the tongue every 5 (five) minutes as needed for chest pain. 25 tablet 3   ranolazine (RANEXA) 1000 MG SR tablet TAKE 1 TABLET BY MOUTH 2 TIMES DAILY 180 tablet 3   Semaglutide (RYBELSUS) 7 MG TABS TAKE ONE TABLET (7 MG TOTAL) BY MOUTH DAILY BEFORE BREAKFAST. 30 tablet 6   tamsulosin (FLOMAX) 0.4 MG CAPS capsule Take 1 capsule (0.4 mg total) by mouth daily. 90 capsule 3   Current Facility-Administered Medications  Medication Dose Route Frequency Provider Last Rate  Last Admin   sodium chloride flush (NS) 0.9 % injection 3 mL  3 mL Intravenous Q12H Furth, Cadence H, [name]         Allergies:   Ramipril   Social History:  The patient  reports that he has never smoked. His smokeless tobacco use includes chew. He reports that he does not drink alcohol and does not use drugs.   Family History:   family history includes Stroke in his mother.    Review of Systems: Review of Systems  Constitutional: Negative.   HENT: Negative.    Respiratory: Negative.    Cardiovascular:  Positive for chest pain.  Gastrointestinal: Negative.   Musculoskeletal: Negative.   Neurological: Negative.   Psychiatric/Behavioral: Negative.    All other systems reviewed and are negative.   PHYSICAL EXAM: VS:  BP 138/60 (BP Location: Left Arm, Patient Position: Sitting, Cuff Size: Normal)   Pulse (!) 55   Ht 5\' 6"  (1.676 m)   Wt 161 lb (73 kg)   SpO2 98%   BMI 25.99 kg/m  , BMI Body mass index is 25.99 kg/m.  Constitutional:   oriented to person, place, and time. No distress.  HENT:  Head: Grossly normal Eyes:  no discharge. No scleral icterus.  Neck: No JVD, no carotid bruits  Cardiovascular: Regular rate and rhythm, no murmurs appreciated Pulmonary/Chest: Clear to auscultation bilaterally, no wheezes or rails Abdominal: Soft.  no distension.  no tenderness.  Musculoskeletal: Normal range of motion Neurological:  normal muscle tone. Coordination normal. No atrophy Skin: Skin warm and dry Psychiatric: normal affect, pleasant  Recent Labs: 06/16/2022: ALT 52 07/02/2022: B Natriuretic Peptide 121.5; Magnesium 2.1 07/30/2022: Hemoglobin 12.3; Platelets 237 02/04/2023: BUN 26; Creatinine, Ser 2.05; Potassium 5.4; Sodium 139    Lipid Panel Lab Results  Component Value Date   CHOL 114 05/16/2021   HDL 35 (L) 05/16/2021   LDLCALC 59 05/16/2021   TRIG 104 05/16/2021    Wt Readings from Last 3 Encounters:  03/27/23 161 lb (73 kg)  02/17/23 165 lb (74.8 kg)  02/04/23 160 lb 9.6 oz (72.8 kg)      ASSESSMENT AND PLAN:  CAD with stable angina Currently with no symptoms of angina. No further workup at this time. Continue current medication regimen.  Active in his garden catheterization February 2023 with severe three-vessel coronary disease Unsuccessful attempt RCA lesion Seen by C[facility] clinic in [facility], medical management recommended  no further testing at this time  Near syncope/syncope Blood pressure is well controlled on today's visit. No changes made to the medications. Denies orthostasis symptoms  essential hypertension - Plan: EKG 12-Lead Blood pressure is well controlled on today's visit. No changes made to the medications.  Type 2 diabetes mellitus with complication, without long-term current use of insulin (HCC) A1c >9 managed by primary care Recommend he decrease his potatoes, breads, talk with primary care to discuss strategies to get A1c back down to 7 range  Hyperlipidemia Repeat lipid  panel today  previously at goal   Total encounter time more than 30 minutes  Greater than 50% was spent in counseling and coordination of care with the patient   Orders Placed This Encounter  Procedures   EKG 12-Lead     Signed, Dossie Arbour, M.D., Ph.D. 03/27/2023  [facility] [place], [place] [phone]

## 2023-03-24 DIAGNOSIS — K219 Gastro-esophageal reflux disease without esophagitis: Secondary | ICD-10-CM | POA: Diagnosis not present

## 2023-03-24 DIAGNOSIS — E1122 Type 2 diabetes mellitus with diabetic chronic kidney disease: Secondary | ICD-10-CM | POA: Diagnosis not present

## 2023-03-24 DIAGNOSIS — I10 Essential (primary) hypertension: Secondary | ICD-10-CM | POA: Diagnosis not present

## 2023-03-24 DIAGNOSIS — E785 Hyperlipidemia, unspecified: Secondary | ICD-10-CM | POA: Diagnosis not present

## 2023-03-24 DIAGNOSIS — N400 Enlarged prostate without lower urinary tract symptoms: Secondary | ICD-10-CM | POA: Diagnosis not present

## 2023-03-24 DIAGNOSIS — R8290 Unspecified abnormal findings in urine: Secondary | ICD-10-CM | POA: Diagnosis not present

## 2023-03-24 DIAGNOSIS — M1000 Idiopathic gout, unspecified site: Secondary | ICD-10-CM | POA: Diagnosis not present

## 2023-03-24 DIAGNOSIS — D631 Anemia in chronic kidney disease: Secondary | ICD-10-CM | POA: Diagnosis not present

## 2023-03-27 ENCOUNTER — Encounter: Payer: Self-pay | Admitting: Cardiovascular Disease

## 2023-03-27 ENCOUNTER — Ambulatory Visit: Payer: Medicare PPO | Admitting: Cardiovascular Disease

## 2023-03-27 VITALS — BP 138/60 | HR 55 | Ht 66.0 in | Wt 161.0 lb

## 2023-03-27 DIAGNOSIS — N183 Chronic kidney disease, stage 3 unspecified: Secondary | ICD-10-CM | POA: Diagnosis not present

## 2023-03-27 DIAGNOSIS — I951 Orthostatic hypotension: Secondary | ICD-10-CM | POA: Diagnosis not present

## 2023-03-27 DIAGNOSIS — I10 Essential (primary) hypertension: Secondary | ICD-10-CM | POA: Diagnosis not present

## 2023-03-27 DIAGNOSIS — I5022 Chronic systolic (congestive) heart failure: Secondary | ICD-10-CM | POA: Diagnosis not present

## 2023-03-27 DIAGNOSIS — E785 Hyperlipidemia, unspecified: Secondary | ICD-10-CM

## 2023-03-27 DIAGNOSIS — I493 Ventricular premature depolarization: Secondary | ICD-10-CM

## 2023-03-27 DIAGNOSIS — I471 Supraventricular tachycardia, unspecified: Secondary | ICD-10-CM | POA: Diagnosis not present

## 2023-03-27 DIAGNOSIS — I25118 Atherosclerotic heart disease of native coronary artery with other forms of angina pectoris: Secondary | ICD-10-CM | POA: Diagnosis not present

## 2023-03-27 DIAGNOSIS — E1169 Type 2 diabetes mellitus with other specified complication: Secondary | ICD-10-CM | POA: Diagnosis not present

## 2023-03-27 DIAGNOSIS — E782 Mixed hyperlipidemia: Secondary | ICD-10-CM | POA: Diagnosis not present

## 2023-03-27 NOTE — Patient Instructions (Addendum)
Medication Instructions:  No changes  If you need a refill on your cardiac medications before your next appointment, please call your pharmacy.   Lab work: Your provider would like for you to have following labs drawn today Lipid.    Testing/Procedures: No new testing needed  Follow-Up: At [facility], you and your health needs are our priority.  As part of our continuing mission to provide you with exceptional heart care, we have created designated Provider Care Teams.  These Care Teams include your primary Cardiologist (physician) and Advanced Practice Providers (APPs -  Physician Assistants and Nurse Practitioners) who all work together to provide you with the care you need, when you need it.  You will need a follow up appointment in 12 months  Providers on your designated Care Team:   Nicolasa Ducking, NP Eula Listen, [name] Cadence Fransico Michael, [place]  COVID-19 Vaccine Information can be found at: [email or web address] For questions related to vaccine distribution or appointments, please email [email or web address] or call [phone].

## 2023-03-28 LAB — LIPID PANEL
Chol/HDL Ratio: 3.3 ratio (ref 0.0–5.0)
Cholesterol, Total: 130 mg/dL (ref 100–199)
HDL: 40 mg/dL (ref >39)
LDL Chol Calc (NIH): 61 mg/dL (ref 0–99)
Triglycerides: 169 mg/dL — ABNORMAL HIGH (ref 0–149)
VLDL Cholesterol Cal: 29 mg/dL (ref 5–40)

## 2023-04-02 ENCOUNTER — Encounter: Payer: Self-pay | Admitting: Emergency Medicine

## 2023-04-07 ENCOUNTER — Ambulatory Visit: Payer: Medicare PPO | Admitting: Family Medicine

## 2023-04-07 NOTE — Progress Notes (Deleted)
Established patient visit   Patient: [name] Hunsaker Sr.   DOB: 09-16-1940   82 y.o. Male  MRN: [id number] Visit Date: 04/07/2023  Today's healthcare provider: [name], MD   No chief complaint on file.  Subjective       Discussed the use of AI scribe software for clinical note transcription with the patient, who gave verbal consent to proceed.  History of Present Illness             Past Medical History:  Diagnosis Date   Arthritis    Back pain    hx   CAD (coronary artery disease)    a. 2004 s/p CABG x 4 (LIMA->LAD, VG->Diag, VG->OM, VG->RCA); b. 2007 Cath: VG->Diag occluded, otw 3/4 patent grafts.   CKD (chronic kidney disease), stage II-III    Colon polyps    DM2 (diabetes mellitus, type 2) (HCC)    stable   GERD (gastroesophageal reflux disease)    Glaucoma    Gout    HLD (hyperlipidemia)    HTN (hypertension)    Neuropathy     Medications: Outpatient Medications Prior to Visit  Medication Sig   acetaminophen (TYLENOL) 650 MG CR tablet Take 650 mg by mouth every 8 (eight) hours as needed.   allopurinol (ZYLOPRIM) 300 MG tablet TAKE ONE TABLET BY MOUTH ONCE DAILY   aspirin 81 MG chewable tablet Chew 81 mg by mouth daily.   atorvastatin (LIPITOR) 20 MG tablet Take 1 tablet (20 mg total) by mouth daily.   BD SHARPS CONTAINER HOME MISC Dispose of syringes after B12 injections   Blood Glucose Monitoring Suppl (ACCU-CHEK AVIVA PLUS) w/Device KIT To check blood sugar once daily   cetirizine (ZYRTEC) 10 MG tablet Take 10 mg by mouth daily. Take 1 tablet daily as needed for allergies   empagliflozin (JARDIANCE) 25 MG TABS tablet Take 1 tablet (25 mg total) by mouth daily.   ferrous sulfate 325 (65 FE) MG tablet Take 325 mg by mouth daily with breakfast.   furosemide (LASIX) 20 MG tablet TAKE ONE TABLET BY MOUTH ONCE A DAY   gabapentin (NEURONTIN) 300 MG capsule TAKE ONE CAPSULE BY MOUTH TWICE DAILY   glipiZIDE (GLUCOTROL XL) 10 MG 24  hr tablet TAKE ONE TABLET BY MOUTH EVERY MORNING WITH BREAKFAST   glucose blood (ACCU-CHEK AVIVA PLUS) test strip USE AS DIRECTED TO CHECK FASTING BLOOD SUGAR EVERY MORNING   metoprolol succinate (TOPROL XL) 25 MG 24 hr tablet Take 1 tablet (25 mg total) by mouth in the morning and at bedtime.   nitroGLYCERIN (NITROSTAT) 0.4 MG SL tablet Place 1 tablet (0.4 mg total) under the tongue every 5 (five) minutes as needed for chest pain.   ranolazine (RANEXA) 1000 MG SR tablet TAKE 1 TABLET BY MOUTH 2 TIMES DAILY   Semaglutide (RYBELSUS) 7 MG TABS TAKE ONE TABLET (7 MG TOTAL) BY MOUTH DAILY BEFORE BREAKFAST.   tamsulosin (FLOMAX) 0.4 MG CAPS capsule Take 1 capsule (0.4 mg total) by mouth daily.   Facility-Administered Medications Prior to Visit  Medication Dose Route Frequency Provider   sodium chloride flush (NS) 0.9 % injection 3 mL  3 mL Intravenous Q12H Furth, Cadence H, [name]    Review of Systems  Last hemoglobin A1c Lab Results  Component Value Date   HGBA1C 9.0 (H) 02/04/2023     {See past labs  Heme  Chem  Endocrine  Serology  Results Review (optional):1}   Objective    There were  no vitals taken for this visit. {Insert last BP/Wt (optional):23777}{See vitals history (optional):1}   Physical Exam  ***  No results found for any visits on 04/07/23.  Assessment & Plan     Problem List Items Addressed This Visit   None   Assessment and Plan              No follow-ups on file.         [name], MD  [facility] [phone] (phone) [phone] (fax)  [facility]

## 2023-04-13 ENCOUNTER — Other Ambulatory Visit: Payer: Self-pay | Admitting: Family Medicine

## 2023-04-13 DIAGNOSIS — E118 Type 2 diabetes mellitus with unspecified complications: Secondary | ICD-10-CM

## 2023-04-15 ENCOUNTER — Telehealth: Payer: Self-pay | Admitting: Pharmacist

## 2023-04-15 ENCOUNTER — Other Ambulatory Visit: Payer: Medicare PPO | Admitting: Pharmacist

## 2023-04-15 NOTE — Progress Notes (Signed)
Outreach Note  04/15/2023 Name: [name] [facility] Sr. MRN: [id number] DOB: 07/14/1941  Referred by: [name], MD  Was unable to reach patient via telephone today and have left HIPAA compliant voicemail asking patient to return my call.   Follow Up Plan: Will collaborate with Care Guide to outreach to schedule follow up with me  Estelle Grumbles, PharmD, [facility] [phone]

## 2023-04-18 ENCOUNTER — Other Ambulatory Visit: Payer: Self-pay | Admitting: Family Medicine

## 2023-04-18 DIAGNOSIS — E118 Type 2 diabetes mellitus with unspecified complications: Secondary | ICD-10-CM

## 2023-04-20 ENCOUNTER — Other Ambulatory Visit: Payer: Self-pay

## 2023-04-20 ENCOUNTER — Telehealth: Payer: Self-pay | Admitting: Family Medicine

## 2023-04-20 DIAGNOSIS — E118 Type 2 diabetes mellitus with unspecified complications: Secondary | ICD-10-CM

## 2023-04-20 MED ORDER — ACCU-CHEK AVIVA PLUS VI STRP: ORAL_STRIP | 0 refills | Status: DC

## 2023-04-20 NOTE — Telephone Encounter (Signed)
[place] pharmacy is requesting prescription refill glucose blood (ACCU-CHEK AVIVA PLUS) test strip  Please advise

## 2023-04-20 NOTE — Telephone Encounter (Signed)
refilled 

## 2023-04-20 NOTE — Telephone Encounter (Signed)
Requested Prescriptions  Refused Prescriptions Disp Refills   ACCU-CHEK AVIVA PLUS test strip [Pharmacy Med Name: ACCU-CHEK AVIVA PLUS AVIVA PL STRIP] 100 strip 0    Sig: USE AS DIRECTED TO CHECK FASTING BLOOD SUGAR EVERY MORNING     Endocrinology: Diabetes - Testing Supplies Passed - 04/18/2023 10:14 AM      Passed - Valid encounter within last 12 months    Recent Outpatient Visits           2 months ago Type 2 diabetes mellitus with hyperlipidemia (HCC)   [place] [facility] Simmons-Robinson, [place], MD   3 months ago Type 2 diabetes mellitus with hyperlipidemia (HCC)   [place] [facility] Simmons-Robinson, [place], MD   4 months ago Type 2 diabetes mellitus with hyperlipidemia (HCC)   [facility] Simmons-Robinson, [place], MD   6 months ago Type 2 diabetes mellitus with complication, without long-term current use of insulin (HCC)   [facility] Simmons-Robinson, [place], MD   6 months ago Hypertension associated with diabetes [facility])   [place] [facility] Simmons-Robinson, Tawanna Cooler, MD       Future Appointments             In 1 month Riddle, Luella Cook, NP [facility]

## 2023-04-21 ENCOUNTER — Other Ambulatory Visit: Payer: Self-pay | Admitting: Family Medicine

## 2023-04-21 DIAGNOSIS — M1A9XX0 Chronic gout, unspecified, without tophus (tophi): Secondary | ICD-10-CM

## 2023-04-22 NOTE — Telephone Encounter (Signed)
Requested medication (s) are due for refill today:yes  Requested medication (s) are on the active medication list: yes  Last refill:  01/08/23 #90  Future visit scheduled: no  Notes to clinic:  overdue lab work    Requested Prescriptions  Pending Prescriptions Disp Refills   allopurinol (ZYLOPRIM) 300 MG tablet [Pharmacy Med Name: ALLOPURINOL 300MG  TABLET] 90 tablet 0    Sig: TAKE ONE TABLET BY MOUTH ONCE DAILY     Endocrinology:  Gout Agents - allopurinol Failed - 04/21/2023 10:39 AM      Failed - Uric Acid in normal range and within 360 days    Uric Acid  Date Value Ref Range Status  04/23/2020 3.3 (L) 3.8 - 8.4 mg/dL Final    Comment:               Therapeutic target for gout patients: <6.0         Failed - Cr in normal range and within 360 days    Creat  Date Value Ref Range Status  06/12/2017 1.43 (H) 0.70 - 1.18 mg/dL Final    Comment:    For patients >43 years of age, the reference limit for Creatinine is approximately 13% higher for people identified as African-American. .    Creatinine, Ser  Date Value Ref Range Status  02/04/2023 2.05 (H) 0.76 - 1.27 mg/dL Final         Passed - Valid encounter within last 12 months    Recent Outpatient Visits           2 months ago Type 2 diabetes mellitus with hyperlipidemia (HCC)   [place] [facility] Simmons-Robinson, [place], MD   3 months ago Type 2 diabetes mellitus with hyperlipidemia (HCC)   [place] [facility] Simmons-Robinson, [place], MD   4 months ago Type 2 diabetes mellitus with hyperlipidemia (HCC)   [place] [facility] Simmons-Robinson, Financial controller, MD   6 months ago Type 2 diabetes mellitus with complication, without long-term current use of insulin (HCC)   [place] [facility] Simmons-Robinson, [place], MD   6 months ago Hypertension associated with diabetes (HCC)   [place] [facility] Simmons-Robinson, Financial controller,  MD       Future Appointments             In 4 weeks Sherie Don, NP [place] [facility] - CBC within normal limits and completed in the last 12 months    WBC  Date Value Ref Range Status  07/30/2022 8.8 4.0 - 10.5 K/uL Final   RBC  Date Value Ref Range Status  07/30/2022 3.91 (L) 4.22 - 5.81 MIL/uL Final   Hemoglobin  Date Value Ref Range Status  07/30/2022 12.3 (L) 13.0 - 17.0 g/dL Final  32/44/0102 72.5 (L) 13.0 - 17.7 g/dL Final   HCT  Date Value Ref Range Status  07/30/2022 36.7 (L) 39.0 - 52.0 % Final   Hematocrit  Date Value Ref Range Status  06/16/2022 31.8 (L) 37.5 - 51.0 % Final   MCHC  Date Value Ref Range Status  07/30/2022 33.5 30.0 - 36.0 g/dL Final   [facility]  Date Value Ref Range Status  07/30/2022 31.5 26.0 - 34.0 pg Final   MCV  Date Value Ref Range Status  07/30/2022 93.9 80.0 - 100.0 fL Final  06/16/2022 88 79 - 97 fL Final   No results found for: "PLTCOUNTKUC", "  LABPLAT", "POCPLA" RDW  Date Value Ref Range Status  07/30/2022 14.8 11.5 - 15.5 % Final  06/16/2022 15.0 11.6 - 15.4 % Final

## 2023-04-27 ENCOUNTER — Telehealth: Payer: Self-pay

## 2023-04-27 NOTE — Progress Notes (Signed)
Care Coordination Note  04/27/2023 Name: Oran Ranjel [facility] Sr. MRN: [id number] DOB: 06-Dec-1940  [name] Alpha Sr. is a 82 y.o. year old male who is a primary care patient of Simmons-Robinson, Tawanna Cooler, MD and is actively engaged with the Chronic Care Management team. I reached out to [facility] by phone today to assist with re-scheduling a follow up visit with the Pharmacist  Follow up plan: Unsuccessful telephone outreach attempt made. A HIPAA compliant phone message was left for the patient providing contact information and requesting a return call.  If patient returns call to provider office, please advise to call  Care Guide [name]  at [phone]  [name], RMA Care Guide [facility]  [place], [place] [address] Direct Dial: [phone] [email or web address]

## 2023-04-28 ENCOUNTER — Ambulatory Visit: Payer: Medicare PPO | Admitting: Family Medicine

## 2023-04-28 ENCOUNTER — Encounter: Payer: Self-pay | Admitting: Family Medicine

## 2023-04-28 VITALS — BP 138/64 | HR 50 | Temp 97.6°F | Ht 66.0 in | Wt 165.0 lb

## 2023-04-28 DIAGNOSIS — N401 Enlarged prostate with lower urinary tract symptoms: Secondary | ICD-10-CM | POA: Diagnosis not present

## 2023-04-28 DIAGNOSIS — M1A0790 Idiopathic chronic gout, unspecified ankle and foot, without tophus (tophi): Secondary | ICD-10-CM

## 2023-04-28 DIAGNOSIS — R3911 Hesitancy of micturition: Secondary | ICD-10-CM

## 2023-04-28 DIAGNOSIS — E1169 Type 2 diabetes mellitus with other specified complication: Secondary | ICD-10-CM | POA: Diagnosis not present

## 2023-04-28 DIAGNOSIS — N1831 Chronic kidney disease, stage 3a: Secondary | ICD-10-CM | POA: Diagnosis not present

## 2023-04-28 DIAGNOSIS — E782 Mixed hyperlipidemia: Secondary | ICD-10-CM

## 2023-04-28 DIAGNOSIS — Z23 Encounter for immunization: Secondary | ICD-10-CM | POA: Diagnosis not present

## 2023-04-28 DIAGNOSIS — Z0000 Encounter for general adult medical examination without abnormal findings: Secondary | ICD-10-CM | POA: Diagnosis not present

## 2023-04-28 DIAGNOSIS — I152 Hypertension secondary to endocrine disorders: Secondary | ICD-10-CM

## 2023-04-28 DIAGNOSIS — I5042 Chronic combined systolic (congestive) and diastolic (congestive) heart failure: Secondary | ICD-10-CM | POA: Diagnosis not present

## 2023-04-28 DIAGNOSIS — E785 Hyperlipidemia, unspecified: Secondary | ICD-10-CM

## 2023-04-28 DIAGNOSIS — E1159 Type 2 diabetes mellitus with other circulatory complications: Secondary | ICD-10-CM | POA: Diagnosis not present

## 2023-04-28 MED ORDER — ALLOPURINOL 100 MG PO TABS
100.0000 mg | ORAL_TABLET | Freq: Every day | ORAL | 2 refills | Status: DC
Start: 2023-04-28 — End: 2024-03-01

## 2023-04-28 NOTE — Assessment & Plan Note (Signed)
Type 2 Diabetes Mellitus Suboptimal control with persistent hyperglycemia despite current regimen of Rybelsus 7mg  daily, Jardiance 25mg  daily, and Glipizide 10mg  daily. Patient has made dietary modifications with limited success. A1c was 9 on July 10th. -Check A1c after October 10th to assess efficacy of current regimen. -Discussed potential future use of insulin, but patient prefers to avoid this if possible.

## 2023-04-28 NOTE — Patient Instructions (Addendum)
VISIT SUMMARY:  During your visit, we discussed your ongoing difficulty in managing your blood glucose levels despite your current medication regimen. We also discussed your chronic kidney disease and the need to adjust your Allopurinol dosage. Your hypertension, heart failure, hyperlipidemia, and benign prostatic hyperplasia are all stable with no changes to your current regimen.  YOUR PLAN:  -TYPE 2 DIABETES MELLITUS: This is a condition where your body does not use insulin properly, leading to high blood sugar levels. We will check your A1c after October 10th to assess the effectiveness of your current regimen. We also discussed the potential future use of insulin, but you prefer to avoid this if possible.  -CHRONIC KIDNEY DISEASE: This is a condition where your kidneys are damaged and cannot filter blood as well as healthy kidneys. We will reduce your Allopurinol to 100mg  daily to protect your renal function. If obtaining a new prescription is an issue, you can cut your current 300mg  tablets in half.  -HYPERTENSION: This is a condition where the force of the blood against the artery walls is too high. Your hypertension is well controlled, so you should continue taking Metoprolol 25mg  daily.  -HEART FAILURE: This is a condition where your heart can't pump enough blood to meet your body's needs. Your heart failure is stable, so you should continue taking Lasix 20mg  daily.  -HYPERLIPIDEMIA: This is a condition where there are high levels of fats (lipids) in your blood. There are no changes to your current regimen, so you should continue taking Atorvastatin 20mg  daily.  -BENIGN PROSTATIC HYPERPLASIA: This is a condition where the prostate gland is enlarged and may cause problems with urination. Your benign prostatic hyperplasia is stable, so you should continue taking Flomax 0.4mg  daily.  INSTRUCTIONS:  Please remember to check your A1c after October 10th and adjust your Allopurinol dosage as  discussed. Continue taking your other medications as prescribed. If you have any questions or concerns, don't hesitate to contact the office.

## 2023-04-28 NOTE — Assessment & Plan Note (Signed)
had influenza vaccine today

## 2023-04-28 NOTE — Assessment & Plan Note (Signed)
  Stable, no changes to current regimen. -continue to follow up with cardiology as scheduled  -continue metoprolol 25mg  once daily  -Continue Lasix 20mg  daily.

## 2023-04-28 NOTE — Progress Notes (Signed)
Established patient visit   Patient: [name] Forsberg Sr.   DOB: [birth year]   82 y.o. Male  MRN: [id number] Visit Date: 04/28/2023  Today's healthcare provider: [name], MD   Chief Complaint  Patient presents with   Diabetes    Patient was seen for this 2 months ago.  He states he has been checking his glucose at home and has been getting readings that range 144-255.   Subjective     HPI     Diabetes    Additional comments: Patient was seen for this 2 months ago.  He states he has been checking his glucose at home and has been getting readings that range 144-255.      Last edited by Adline Peals, CMA on 04/28/2023  8:41 AM.       Discussed the use of AI scribe software for clinical note transcription with the patient, who gave verbal consent to proceed.  History of Present Illness   The patient, with a history of diabetes, hypertension, heart failure, and gout, presents with ongoing difficulty in managing his blood glucose levels despite adherence to his current medication regimen, which includes Rybelsus 7mg , Jardiance 25mg , and Glipizide 10mg . He reports persistent high morning blood glucose readings, with occasional readings in the 100s but more frequently in the 200s. He notes no significant changes in his diet, and has even made adjustments such as reducing potato intake and incorporating more garden vegetables.  The patient also has a history of chronic kidney disease and is on Allopurinol, which he has been taking for several years. He reports a past experience of gout flare-ups when he stopped taking Allopurinol.  In addition to his metabolic and renal conditions, the patient is also being treated for heart failure with Lasix 20mg  and Metoprolol 25mg , and for BPH with Flomax 0.4mg . He reports no recent episodes of syncope or pre-syncope.  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 He reports no other significant changes or events in his health since the last consultation.      LABS A1c: 9 (02/04/2023) Blood Glucose: 202 (04/25/2023) Blood Glucose: 197 (04/26/2023) Blood Glucose: 144 (04/27/2023) Blood Glucose: 111 (03/31/2023)   Past Medical History:  Diagnosis Date   Arthritis    Back pain    hx   CAD (coronary artery disease)    a. 2004 s/p CABG x 4 (LIMA->LAD, VG->Diag, VG->OM, VG->RCA); b. 2007 Cath: VG->Diag occluded, otw 3/4 patent grafts.   CKD (chronic kidney disease), stage II-III    Colon polyps    DM2 (diabetes mellitus, type 2) (HCC)    stable   GERD (gastroesophageal reflux disease)    Glaucoma    Gout    HLD (hyperlipidemia)    HTN (hypertension)    Neuropathy     Medications: Outpatient Medications Prior to Visit  Medication Sig   acetaminophen (TYLENOL) 650 MG CR tablet Take 650 mg by mouth every 8 (eight) hours as needed.   aspirin 81 MG chewable tablet Chew 81 mg by mouth daily.   atorvastatin (LIPITOR) 20 MG tablet Take 1 tablet (20 mg total) by mouth daily.   BD SHARPS CONTAINER HOME MISC Dispose of syringes after B12 injections   Blood Glucose Monitoring Suppl (ACCU-CHEK AVIVA PLUS) w/Device KIT To check blood sugar once daily   cetirizine (ZYRTEC) 10 MG tablet Take 10 mg by mouth daily. Take 1  tablet daily as needed for allergies   empagliflozin (JARDIANCE) 25 MG TABS tablet Take 1 tablet (25 mg total) by mouth daily.   ferrous sulfate 325 (65 FE) MG tablet Take 325 mg by mouth daily with breakfast.   furosemide (LASIX) 20 MG tablet TAKE ONE TABLET BY MOUTH ONCE A DAY   gabapentin (NEURONTIN) 300 MG capsule TAKE ONE CAPSULE BY MOUTH TWICE DAILY   glipiZIDE (GLUCOTROL XL) 10 MG 24 hr tablet TAKE ONE TABLET BY MOUTH EVERY MORNING WITH BREAKFAST   glucose blood (ACCU-CHEK AVIVA PLUS) test strip USE AS DIRECTED TO CHECK FASTING BLOOD SUGAR EVERY MORNING   metoprolol succinate (TOPROL XL) 25 MG 24 hr  tablet Take 1 tablet (25 mg total) by mouth in the morning and at bedtime.   nitroGLYCERIN (NITROSTAT) 0.4 MG SL tablet Place 1 tablet (0.4 mg total) under the tongue every 5 (five) minutes as needed for chest pain.   ranolazine (RANEXA) 1000 MG SR tablet TAKE 1 TABLET BY MOUTH 2 TIMES DAILY   Semaglutide (RYBELSUS) 7 MG TABS TAKE ONE TABLET (7 MG TOTAL) BY MOUTH DAILY BEFORE BREAKFAST.   tamsulosin (FLOMAX) 0.4 MG CAPS capsule Take 1 capsule (0.4 mg total) by mouth daily.   [DISCONTINUED] allopurinol (ZYLOPRIM) 300 MG tablet TAKE ONE TABLET BY MOUTH ONCE DAILY   Facility-Administered Medications Prior to Visit  Medication Dose Route Frequency Provider   sodium chloride flush (NS) 0.9 % injection 3 mL  3 mL Intravenous Q12H Furth, Cadence H, [name]    Review of Systems  Last metabolic panel Lab Results  Component Value Date   GLUCOSE 198 (H) 02/04/2023   NA 139 02/04/2023   K 5.4 (H) 02/04/2023   CL 103 02/04/2023   CO2 21 02/04/2023   BUN 26 02/04/2023   CREATININE 2.05 (H) 02/04/2023   EGFR 32 (L) 02/04/2023   CALCIUM 9.6 02/04/2023   PROT 5.9 (L) 06/16/2022   ALBUMIN 3.5 06/16/2022   LABGLOB 1.8 05/16/2021   AGRATIO 2.7 (H) 05/16/2021   BILITOT 0.6 06/16/2022   ALKPHOS 58 06/16/2022   AST 27 06/16/2022   ALT 52 (H) 06/16/2022   ANIONGAP 12 07/30/2022        Objective    BP 138/64 (BP Location: Right Arm, Patient Position: Sitting, Cuff Size: Normal)   Pulse (!) 50   Temp 97.6 F (36.4 C) (Oral)   Ht 5\' 6"  (1.676 m)   Wt 165 lb (74.8 kg)   SpO2 98%   BMI 26.63 kg/m     Physical Exam Vitals reviewed.  Constitutional:      General: He is not in acute distress.    Appearance: Normal appearance. He is not ill-appearing, toxic-appearing or diaphoretic.  Eyes:     Conjunctiva/sclera: Conjunctivae normal.  Cardiovascular:     Rate and Rhythm: Normal rate and regular rhythm.     Pulses: Normal pulses.     Heart sounds: Normal heart sounds. No murmur heard.    No  friction rub. No gallop.  Pulmonary:     Effort: Pulmonary effort is normal. No respiratory distress.     Breath sounds: Normal breath sounds. No stridor. No wheezing, rhonchi or rales.  Abdominal:     General: Bowel sounds are normal. There is no distension.     Palpations: Abdomen is soft.     Tenderness: There is no abdominal tenderness.  Musculoskeletal:     Right lower leg: No edema.     Left lower leg: No edema.  Skin:    Findings: No erythema or rash.  Neurological:     Mental Status: He is alert and oriented to person, place, and time.       No results found for any visits on 04/28/23.  Assessment & Plan     Problem List Items Addressed This Visit     Benign prostatic hyperplasia with urinary hesitancy    Stable, no changes to current regimen. -Continue Flomax 0.4mg  daily.       Chronic combined systolic and diastolic CHF (congestive heart failure) (HCC)     Stable, no changes to current regimen. -continue to follow up with cardiology as scheduled  -continue metoprolol 25mg  once daily  -Continue Lasix 20mg  daily.      Gout, unspecified    Chronic  Well controlled on allopurinol  Will decrease to 100mg  daily due to CKD3B, pt voiced understanding        Relevant Medications   allopurinol (ZYLOPRIM) 100 MG tablet   Healthcare maintenance    had influenza vaccine today       Hypertension associated with diabetes (HCC)    Hypertension Well controlled, no changes to current regimen. -Continue Metoprolol 25mg  daily. Also followed by cardiology, follow up with specialist as scheduled       Mixed hyperlipidemia    Hyperlipidemia No changes to current regimen. -Continue Atorvastatin 20mg  daily.      Stage 3a chronic kidney disease (HCC)    Chronic  Recommended continued guideline directed therapy  Follows with nephrology, Dr. Suezanne Jacquet, next appt in Jan 2025 Reduce Allopurinol to 100mg  daily. If obtaining new prescription is an issue, advised patient  to cut current 300mg  tablets in half.      Type 2 diabetes mellitus with hyperlipidemia (HCC) - Primary    Type 2 Diabetes Mellitus Suboptimal control with persistent hyperglycemia despite current regimen of Rybelsus 7mg  daily, Jardiance 25mg  daily, and Glipizide 10mg  daily. Patient has made dietary modifications with limited success. A1c was 9 on July 10th. -Check A1c after October 10th to assess efficacy of current regimen. -Discussed potential future use of insulin, but patient prefers to avoid this if possible.      Other Visit Diagnoses     Need for influenza vaccination       Relevant Orders   Flu Vaccine Trivalent High Dose (Fluad) (Completed)              Return in about 1 month (around 05/29/2023) for DM,A1c .         [name], MD  [facility] [phone] (phone) [phone] (fax)  [facility]

## 2023-04-28 NOTE — Assessment & Plan Note (Signed)
Hyperlipidemia No changes to current regimen. -Continue Atorvastatin 20mg  daily.

## 2023-04-28 NOTE — Assessment & Plan Note (Signed)
Stable, no changes to current regimen. -Continue Flomax 0.4mg  daily.

## 2023-04-28 NOTE — Assessment & Plan Note (Signed)
Chronic  Well controlled on allopurinol  Will decrease to 100mg  daily due to CKD3B, pt voiced understanding

## 2023-04-28 NOTE — Assessment & Plan Note (Signed)
Chronic  Recommended continued guideline directed therapy  Follows with nephrology, Dr. Suezanne Jacquet, next appt in Jan 2025 Reduce Allopurinol to 100mg  daily. If obtaining new prescription is an issue, advised patient to cut current 300mg  tablets in half.

## 2023-04-28 NOTE — Assessment & Plan Note (Signed)
Hypertension Well controlled, no changes to current regimen. -Continue Metoprolol 25mg  daily. Also followed by cardiology, follow up with specialist as scheduled

## 2023-05-01 NOTE — Progress Notes (Signed)
Care Coordination Note  05/01/2023 Name: [name] [facility] Sr. MRN: [id number] DOB: 28-Apr-1941  [name] Alpha Sr. is a 82 y.o. year old male who is a primary care patient of Simmons-Robinson, Tawanna Cooler, MD and is actively engaged with the Chronic Care Management team. I reached out to [facility] Sr. by phone today to assist with re-scheduling a follow up visit with the Pharmacist  Follow up plan: Telephone appointment with care management team member scheduled for:06/17/2023 [name], RMA Care Guide [facility]  [place], [place] [address] Direct Dial: [phone] [name]

## 2023-05-19 NOTE — Progress Notes (Unsigned)
Cardiology Office Note Date:  05/20/2023  Patient ID:  [name] Bitting Sr., DOB [birth year], MRN [id number] PCP:  [name], MD  Cardiologist:  Julien Nordmann, MD Electrophysiologist: Lanier Prude, MD   Chief Complaint: PVC, SVT follow-up  History of Present Illness: [name] Hendrick Basaldua Sr. is a 82 y.o. male with PMH notable for diastolic HF, CAD s/p CABG, HTN, T2DM, CKD- 3, SVT, PVCs; seen today for Lanier Prude, MD for routine electrophysiology followup.  He saw Dr. Lalla Brothers 11/2022 for consult of SVT and PVCs.  He had intermittent fluttering in his chest and some central chest pain.  Toprol 25 mg twice daily was started, did not favor AAD given conduction system disease on EKG.  I saw him in follow-up 01/2023, where he continued to have palpitation episodes and extra beats. Very active, occasional PVC on EKG.  On follow-up today, he continues to do well. 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 He denies chest pain, chest pressure, palpitations. He does have intermittent dizziness, typically with position changes or in the afternoon when he has been working hard. Often does not take breaks during work to eat, but will bring snacks - pack of nabs, Honey bun, or oatmeal cookie. He is frustrated by his fasting glucose in the mornings, often over 200. He used to be on metformin and BG then was less than 130s.  He checks BP at home sporadically, readings in the 150s systolic. He did not take his medications this morning before visit.   Past Medical History:  Diagnosis Date   Arthritis    Back pain    hx   CAD (coronary artery disease)    a. 2004 s/p CABG x 4 (LIMA->LAD, VG->Diag, VG->OM, VG->RCA); b. 2007 Cath: VG->Diag occluded, otw 3/4 patent grafts.   CKD (chronic kidney disease), stage II-III    Colon polyps    DM2 (diabetes mellitus, type 2) (HCC)    stable   GERD (gastroesophageal reflux disease)    Glaucoma    Gout    HLD  (hyperlipidemia)    HTN (hypertension)    Neuropathy     Past Surgical History:  Procedure Laterality Date   BACK SURGERY     Cyst removed   CORONARY ARTERY BYPASS GRAFT  2004   CORONARY STENT INTERVENTION N/A 09/24/2021   Procedure: CORONARY STENT INTERVENTION;  Surgeon: Yvonne Kendall, MD;  Location: [facility] INVASIVE CV LAB;  Service: Cardiovascular;  Laterality: N/A;   KNEE SURGERY     LEFT HEART CATH AND CORONARY ANGIOGRAPHY Left 09/24/2021   Procedure: LEFT HEART CATH AND CORONARY ANGIOGRAPHY;  Surgeon: Yvonne Kendall, MD;  Location: [facility] INVASIVE CV LAB;  Service: Cardiovascular;  Laterality: Left;    Current Outpatient Medications  Medication Instructions   acetaminophen (TYLENOL) 650 mg, Oral, Every 8 hours PRN   allopurinol (ZYLOPRIM) 100 mg, Oral, Daily   aspirin 81 mg, Oral, Daily   atorvastatin (LIPITOR) 20 mg, Oral, Daily   BD SHARPS CONTAINER HOME MISC Dispose of syringes after B12 injections   Blood Glucose Monitoring Suppl (ACCU-CHEK AVIVA PLUS) w/Device KIT To check blood sugar once daily   cetirizine (ZYRTEC) 10 mg, Oral, Daily, Take 1 tablet daily as needed for allergies   empagliflozin (JARDIANCE) 25 mg, Oral, Daily   ferrous sulfate 325 mg, Oral, Daily with breakfast   furosemide (LASIX) 20 mg, Oral, Every other day   gabapentin (NEURONTIN) 300 mg, Oral, 2 times daily   glipiZIDE (GLUCOTROL XL) 10 MG  24 hr tablet TAKE ONE TABLET BY MOUTH EVERY MORNING WITH BREAKFAST   glucose blood (ACCU-CHEK AVIVA PLUS) test strip USE AS DIRECTED TO CHECK FASTING BLOOD SUGAR EVERY MORNING   metoprolol succinate (TOPROL XL) 25 mg, Oral, 2 times daily   nitroGLYCERIN (NITROSTAT) 0.4 mg, Sublingual, Every 5 min PRN   ranolazine (RANEXA) 1,000 mg, Oral, 2 times daily   Semaglutide (RYBELSUS) 7 MG TABS TAKE ONE TABLET (7 MG TOTAL) BY MOUTH DAILY BEFORE BREAKFAST.   tamsulosin (FLOMAX) 0.4 mg, Oral, Daily    Social History:  The patient  reports that he has never smoked. His  smokeless tobacco use includes chew. He reports that he does not drink alcohol and does not use drugs.   Family History:  The patient's family history includes Stroke in his mother.  ROS:  Please see the history of present illness. All other systems are reviewed and otherwise negative.   PHYSICAL EXAM:  VS:  BP (!) 162/68   Pulse (!) 49   Ht 5\' 8"  (1.727 m)   Wt 161 lb 3.2 oz (73.1 kg)   SpO2 98%   BMI 24.51 kg/m  BMI: Body mass index is 24.51 kg/m.  Vitals:   05/20/23 0916 05/20/23 0925 05/20/23 0950  BP: (!) 170/70 (!) 170/78 (!) 162/68  Pulse: (!) 49    Height: 5\' 8"  (1.727 m)    Weight: 161 lb 3.2 oz (73.1 kg)    SpO2: 98%    BMI (Calculated): 24.52       GEN- The patient is well appearing, alert and oriented x 3 today.   Lungs- Clear to ausculation bilaterally, normal work of breathing.  Heart- Regular, bradycardic rate and rhythm, no murmurs, rubs or gallops Extremities- No peripheral edema, warm, dry   EKG is ordered. Personal review of EKG from today shows:  EKG Interpretation Date/Time:  Wednesday May 20 2023 09:37:02 EDT Ventricular Rate:  51 PR Interval:  198 QRS Duration:  154 QT Interval:  458 QTC Calculation: 422 R Axis:   68  Text Interpretation: Sinus bradycardia Right bundle branch block Confirmed by Sherie Don [phone]) on 05/20/2023 10:01:25 AM    Recent Labs: 06/16/2022: ALT 52 07/02/2022: B Natriuretic Peptide 121.5; Magnesium 2.1 07/30/2022: Hemoglobin 12.3; Platelets 237 02/04/2023: BUN 26; Creatinine, Ser 2.05; Potassium 5.4; Sodium 139  03/27/2023: Chol/HDL Ratio 3.3; Cholesterol, Total 130; HDL 40; LDL Chol Calc (NIH) 61; Triglycerides 169   CrCl cannot be calculated (Patient's most recent lab result is older than the maximum 21 days allowed.).   Wt Readings from Last 3 Encounters:  05/20/23 161 lb 3.2 oz (73.1 kg)  04/28/23 165 lb (74.8 kg)  03/27/23 161 lb (73 kg)     Additional studies reviewed include: Previous EP, cardiology  notes.   Long term monitor, 09/09/2022 Normal sinus rhythm Patient had a min HR of 47 bpm, max HR of 150 bpm, and avg HR of 72 bpm.   1 run of Ventricular Tachycardia occurred lasting 4 beats with a max rate of 150 bpm (avg 142 bpm).   164 Supraventricular Tachycardia runs occurred, the run with the fastest interval lasting 4 beats with a max rate of 138 bpm, the longest lasting 5 mins 44 secs with an avg rate of 106 bpm.   Isolated SVEs were occasional (2.5%, 84132), SVE Couplets were rare (<1.0%, 4695), and SVE Triplets were rare (<1.0%, 1259).   Isolated VEs were frequent (11.1%, [id number]), VE Couplets were rare (<1.0%, 3136), and VE Triplets were  rare (<1.0%, 2). Ventricular Bigeminy and Trigeminy were present.  Patient triggered events (4) with normal sinus rhythm, PACs, PVCs  TTE, 06/18/2022  1. Left ventricular ejection fraction, by estimation, is 45 to 50%. The left ventricle has mildly decreased function. The left ventricle demonstrates global hypokinesis. The left ventricular internal cavity size was mildly dilated. Left ventricular diastolic parameters are consistent with Grade I diastolic dysfunction (impaired relaxation).   2. Right ventricular systolic function is mildly reduced. The right ventricular size is normal. There is normal pulmonary artery systolic pressure.   3. Left atrial size was mildly dilated.   4. The mitral valve is grossly normal. No evidence of mitral valve regurgitation. No evidence of mitral stenosis.   5. The aortic valve is tricuspid. There is moderate calcification of the aortic valve. Aortic valve regurgitation is not visualized. Aortic valve sclerosis/calcification is present, without any evidence of aortic stenosis.   6. The inferior vena cava is dilated in size with >50% respiratory variability, suggesting right atrial pressure of 8 mmHg.  Comparison(s): Prior images reviewed side by side. LV is slightly dilated   TTE, 02/19/2022  1. Left ventricular  ejection fraction, by estimation, is 50 to 55%. The left ventricle has low normal function. The left ventricle has no regional wall motion abnormalities. There is mild left ventricular hypertrophy. Left ventricular diastolic parameters are consistent with Grade III diastolic dysfunction (restrictive). Elevated left atrial pressure. The average left ventricular global longitudinal strain is -12.3 %. The global longitudinal strain is abnormal.   2. Right ventricular systolic function is mildly reduced. The right ventricular size is normal. There is moderately elevated pulmonary artery systolic pressure. The estimated right ventricular systolic pressure is 49.1 mmHg.   3. Left atrial size was moderately dilated.   4. Right atrial size was moderately dilated.   5. The mitral valve is normal in structure. Mild mitral valve regurgitation. No evidence of mitral stenosis.   6. Tricuspid valve regurgitation is mild to moderate.   7. The aortic valve is tricuspid. There is mild calcification of the aortic valve. There is mild thickening of the aortic valve. Aortic valve regurgitation is not visualized. Aortic valve sclerosis/calcification is present, without any evidence of aortic stenosis.   8. Aortic dilatation noted. There is borderline dilatation of the aortic root, measuring 39 mm. There is borderline dilatation of the aortic arch, measuring 31 mm.   9. The inferior vena cava is dilated in size with <50% respiratory variability, suggesting right atrial pressure of 15 mmHg.   Comparison(s): EF 50%, hypokinesis of the basal septal, anteroseptal, inferior/inferoseptal regions, RVSP 44.37mmHg.    ASSESSMENT AND PLAN:  #) SVT #) PVC Denies palpitations currently. EKG without PVCs today.  Continue toprol 25mg  nightly   #) HFmrEF #) HTN #) orthostatic hypotension NYHA I-II symptoms Warm and dry on exam. No lower extremity edema, good appetite No activity limitations GDMT: jardiance, metop Will reduce  lasix to 20mg  every other day given orthostatic symptoms when active. Do not favor reducing BB at this time d/t reduction in PVCs on EKG and symptomatic palpitations Permissive HTN to 150 systolic Encouraged him to check BP a couple times a week, do not skip meals especially when working outdoors in heat  #) CAD No chest pain, pressure Great exercise tolerance     Current medicines are reviewed at length with the patient today.   The patient does not have concerns regarding his medicines.  The following changes were made today:   REDUCE lasix  to 20mg  every other day   Labs/ tests ordered today include:  Orders Placed This Encounter  Procedures   EKG 12-Lead     Disposition: Follow up with Dr. Lalla Brothers or EP APP in 6 months   Signed, Sherie Don, NP  05/20/23  10:12 AM  Electrophysiology [facility]

## 2023-05-20 ENCOUNTER — Encounter: Payer: Self-pay | Admitting: Cardiology

## 2023-05-20 ENCOUNTER — Ambulatory Visit: Payer: Medicare PPO | Attending: Cardiology | Admitting: Cardiology

## 2023-05-20 VITALS — BP 162/68 | HR 49 | Ht 68.0 in | Wt 161.2 lb

## 2023-05-20 DIAGNOSIS — I25118 Atherosclerotic heart disease of native coronary artery with other forms of angina pectoris: Secondary | ICD-10-CM | POA: Diagnosis not present

## 2023-05-20 DIAGNOSIS — I5022 Chronic systolic (congestive) heart failure: Secondary | ICD-10-CM | POA: Diagnosis not present

## 2023-05-20 DIAGNOSIS — I493 Ventricular premature depolarization: Secondary | ICD-10-CM

## 2023-05-20 DIAGNOSIS — I471 Supraventricular tachycardia, unspecified: Secondary | ICD-10-CM | POA: Diagnosis not present

## 2023-05-20 MED ORDER — FUROSEMIDE 20 MG PO TABS: 20.0000 mg | ORAL_TABLET | ORAL | 3 refills | Status: DC

## 2023-05-20 NOTE — Patient Instructions (Signed)
Medication Instructions:  Decrease Lasix to every other day   *If you need a refill on your cardiac medications before your next appointment, please call your pharmacy*   Follow-Up: At [facility], you and your health needs are our priority.  As part of our continuing mission to provide you with exceptional heart care, we have created designated Provider Care Teams.  These Care Teams include your primary Cardiologist (physician) and Advanced Practice Providers (APPs -  Physician Assistants and Nurse Practitioners) who all work together to provide you with the care you need, when you need it.  We recommend signing up for the patient portal called "MyChart".  Sign up information is provided on this After Visit Summary.  MyChart is used to connect with patients for Virtual Visits (Telemedicine).  Patients are able to view lab/test results, encounter notes, upcoming appointments, etc.  Non-urgent messages can be sent to your provider as well.   To learn more about what you can do with MyChart, go to [email or web address].    Your next appointment:   6 month(s)  Provider:   Steffanie Dunn, MD or Sherie Don, NP

## 2023-05-29 NOTE — Progress Notes (Unsigned)
Established patient visit   Patient: [name].   DOB: February 17, 1941   82 y.o. Male  MRN: [id number] Visit Date: 06/01/2023  Today's healthcare provider: [name], MD   No chief complaint on file.  Subjective       Discussed the use of AI scribe software for clinical note transcription with the patient, who gave verbal consent to proceed.  History of Present Illness             Past Medical History:  Diagnosis Date   Arthritis    Back pain    hx   CAD (coronary artery disease)    a. 2004 s/p CABG x 4 (LIMA->LAD, VG->Diag, VG->OM, VG->RCA); b. 2007 Cath: VG->Diag occluded, otw 3/4 patent grafts.   CKD (chronic kidney disease), stage II-III    Colon polyps    DM2 (diabetes mellitus, type 2) (HCC)    stable   GERD (gastroesophageal reflux disease)    Glaucoma    Gout    HLD (hyperlipidemia)    HTN (hypertension)    Neuropathy     Medications: Outpatient Medications Prior to Visit  Medication Sig   acetaminophen (TYLENOL) 650 MG CR tablet Take 650 mg by mouth every 8 (eight) hours as needed.   allopurinol (ZYLOPRIM) 100 MG tablet Take 1 tablet (100 mg total) by mouth daily.   aspirin 81 MG chewable tablet Chew 81 mg by mouth daily.   atorvastatin (LIPITOR) 20 MG tablet Take 1 tablet (20 mg total) by mouth daily.   BD SHARPS CONTAINER HOME MISC Dispose of syringes after B12 injections   Blood Glucose Monitoring Suppl (ACCU-CHEK AVIVA PLUS) w/Device KIT To check blood sugar once daily   cetirizine (ZYRTEC) 10 MG tablet Take 10 mg by mouth daily. Take 1 tablet daily as needed for allergies   empagliflozin (JARDIANCE) 25 MG TABS tablet Take 1 tablet (25 mg total) by mouth daily.   ferrous sulfate 325 (65 FE) MG tablet Take 325 mg by mouth daily with breakfast.   furosemide (LASIX) 20 MG tablet Take 1 tablet (20 mg total) by mouth every other day.   gabapentin (NEURONTIN) 300 MG capsule TAKE ONE CAPSULE BY MOUTH TWICE DAILY    glipiZIDE (GLUCOTROL XL) 10 MG 24 hr tablet TAKE ONE TABLET BY MOUTH EVERY MORNING WITH BREAKFAST   glucose blood (ACCU-CHEK AVIVA PLUS) test strip USE AS DIRECTED TO CHECK FASTING BLOOD SUGAR EVERY MORNING   metoprolol succinate (TOPROL XL) 25 MG 24 hr tablet Take 1 tablet (25 mg total) by mouth in the morning and at bedtime.   nitroGLYCERIN (NITROSTAT) 0.4 MG SL tablet Place 1 tablet (0.4 mg total) under the tongue every 5 (five) minutes as needed for chest pain.   ranolazine (RANEXA) 1000 MG [name] tablet TAKE 1 TABLET BY MOUTH 2 TIMES DAILY   Semaglutide (RYBELSUS) 7 MG TABS TAKE ONE TABLET (7 MG TOTAL) BY MOUTH DAILY BEFORE BREAKFAST.   tamsulosin (FLOMAX) 0.4 MG CAPS capsule Take 1 capsule (0.4 mg total) by mouth daily.   Facility-Administered Medications Prior to Visit  Medication Dose Route Frequency Provider   sodium chloride flush (NS) 0.9 % injection 3 mL  3 mL Intravenous Q12H Furth, Cadence H, [name]    Review of Systems  {Insert previous labs (optional):23779} {See past labs  Heme  Chem  Endocrine  Serology  Results Review (optional):1}   Objective    There were no vitals taken for this visit. {Insert last BP/Wt (optional):23777}{See  vitals history (optional):1}      Physical Exam  ***  No results found for any visits on 06/01/23.  Assessment & Plan     Problem List Items Addressed This Visit   None   Assessment and Plan              No follow-ups on file.         [name], MD  [facility] [phone] (phone) [phone] (fax)  [facility]

## 2023-06-01 ENCOUNTER — Ambulatory Visit: Payer: Medicare PPO | Admitting: Family Medicine

## 2023-06-01 ENCOUNTER — Encounter: Payer: Self-pay | Admitting: Family Medicine

## 2023-06-01 VITALS — BP 138/70 | HR 70 | Ht 68.0 in | Wt 163.2 lb

## 2023-06-01 DIAGNOSIS — E782 Mixed hyperlipidemia: Secondary | ICD-10-CM | POA: Diagnosis not present

## 2023-06-01 DIAGNOSIS — E1159 Type 2 diabetes mellitus with other circulatory complications: Secondary | ICD-10-CM | POA: Diagnosis not present

## 2023-06-01 DIAGNOSIS — M1A0790 Idiopathic chronic gout, unspecified ankle and foot, without tophus (tophi): Secondary | ICD-10-CM | POA: Diagnosis not present

## 2023-06-01 DIAGNOSIS — E1169 Type 2 diabetes mellitus with other specified complication: Secondary | ICD-10-CM

## 2023-06-01 DIAGNOSIS — Z7984 Long term (current) use of oral hypoglycemic drugs: Secondary | ICD-10-CM | POA: Diagnosis not present

## 2023-06-01 DIAGNOSIS — I152 Hypertension secondary to endocrine disorders: Secondary | ICD-10-CM | POA: Diagnosis not present

## 2023-06-01 DIAGNOSIS — E785 Hyperlipidemia, unspecified: Secondary | ICD-10-CM | POA: Diagnosis not present

## 2023-06-01 LAB — POCT GLYCOSYLATED HEMOGLOBIN (HGB A1C): Hemoglobin A1C: 8 % — AB (ref 4.0–5.6)

## 2023-06-01 NOTE — Assessment & Plan Note (Signed)
Type 2 Diabetes Mellitus associated with CKD Fasting blood glucose levels consistently around 190-200. A1c improved from 9.4 to 8.0. Currently on Rybelsus 7mg  daily, Glipizide 10mg  daily, and Jardiance 25mg  daily. Chronic  A1c goal less than 8, improved from 3  months ago with A1c of 8 today  -Continue current medication regimen. -Check A1c again in February 2025. -Encourage patient to monitor postprandial blood glucose levels.

## 2023-06-01 NOTE — Assessment & Plan Note (Signed)
Chronic  Stable  No recent flares  Continue allopurinol 100mg  daily

## 2023-06-01 NOTE — Assessment & Plan Note (Signed)
Currently on Lipitor. Chronic  last lipid panel in August 2024, elevated TG 169, LDL 61 -Continue Lipitor as prescribed.

## 2023-06-01 NOTE — Assessment & Plan Note (Signed)
Blood pressure controlled at 138/70. No reports of chest pain. Patient reports occasional dizziness, particularly when standing still for extended periods. Currently on Metoprolol 25mg  twice daily and Lasix 20mg  every other day. Chronic -Continue current medication regimen. -follow up with cardiology as scheduled  -Advise patient to stay hydrated, particularly when working outdoors.

## 2023-06-02 ENCOUNTER — Encounter: Payer: Self-pay | Admitting: *Deleted

## 2023-06-17 ENCOUNTER — Telehealth: Payer: Self-pay | Admitting: Pharmacist

## 2023-06-17 ENCOUNTER — Other Ambulatory Visit: Payer: Self-pay | Admitting: Pharmacist

## 2023-06-17 NOTE — Patient Instructions (Signed)
Goals Addressed             This Visit's Progress    Pharmacy Goals       Our goal A1c is less than 7%. This corresponds with fasting sugars less than 130 and 2 hour after meal sugars less than 180. Please keep a log of your results when checking your blood sugar   Our goal bad cholesterol, or LDL, is less than 70. This is why it is important to continue taking your atorvastatin.  Please watch the mail for an envelope from [employer] containing the patient assistance program application. Please complete this application and mail back to [facility] Pharmacy Technician Noreene Larsson Simcox along with a copy of your Medicare Part D prescription card and a copy of your proof of income document.  Estelle Grumbles, PharmD, [facility] [phone]

## 2023-06-17 NOTE — Progress Notes (Signed)
06/17/2023 Name: [name] Ramakrishnan [facility] Sr. MRN: [id number] DOB: January 19, 1941  Chief Complaint  Patient presents with   Medication Management   Medication Adherence    [name] Flavin Scahill Sr. is a 82 y.o. year old male who presented for a telephone visit.   They were referred to the pharmacist by their PCP for assistance in managing diabetes and complex medication management.    Subjective:  Care Team: Primary Care Provider: [name], MD ; Next Scheduled Visit: 09/01/2023 Cardiologist: Julien Nordmann, MD Electrophysiologist: Steffanie Dunn, MD Nephrologist: Lorain Childes, MD; Next Scheduled Visit: 08/04/2023  Medication Access/Adherence  Current Pharmacy:  [facility] - [place], [place] - [address] [place] [address] Phone: [phone] Fax: [phone]  [facility] [address] [place] [address] Phone: [phone] Fax: [phone]  [id number] - [place] [facility] - [address] [place] [address] Phone: [phone] Fax: [phone]   Patient reports affordability concerns with their medications: Yes  Patient reports access/transportation concerns to their pharmacy: No  Patient reports adherence concerns with their medications:  No     Reports copayments for Jardiance and Rybelsus still difficult to afford, but denies having returned Rybelsus patient assistance application to CPhT. Reports has misplaced this application   Diabetes:   Current medications:  - Jardiance 25 mg daily - glipizide ER 10 mg daily with breakfast - Rybelsus 7 mg daily before breakfast              Reports tolerating well Confirms takes on an empty stomach, >=30 minutes before the first food, beverage, or other oral medications of the day with <=4 oz of plain water only              Medications tried in the past: metformin (CKD)   Current glucose readings:  morning fasting ranging: 161-188   Patient denies hypoglycemic s/sx including dizziness, shakiness, sweating.     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   Statin therapy: atorvastatin 20 mg daily  Current medication access support: Collaborating with provider, CPhT, and patient to pursue assistance for Rybelsus patient assistance program through [employer] - Today patient is unable to locate application previously mailed to him by CPhT in August   HFmrEF /Hypertension/orthostatic hypotension:   Current medications:  SGLT2i: Jardiance 25 mg daily Beta blocker: metoprolol ER 25 mg twice daily Diuretic regimen: furosemide 20 mg every other day  Current home blood pressure readings: ~1 week ago; recalls it was "good", but does not recall the reading Current home weights: ~165 lbs  Denies further symptoms of hypotension, such as dizziness or lightheadedness, since Cardiologist reduced his furosemide dose to 20 mg every other day (since 05/20/2023)  Patient denies volume overload signs or symptoms including shortness of breath, lower extremity edema, increased use of pillows at night   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    Objective:  Lab Results  Component Value Date   HGBA1C 8.0 (A) 06/01/2023    Lab Results  Component Value Date   CREATININE 2.05 (H) 02/04/2023   BUN 26 02/04/2023   NA 139 02/04/2023   K 5.4 (H) 02/04/2023   CL 103 02/04/2023   CO2 21 02/04/2023    Lab Results  Component Value Date   CHOL 130 03/27/2023   HDL 40 03/27/2023   LDLCALC 61 03/27/2023  TRIG 169 (H) 03/27/2023   CHOLHDL 3.3 03/27/2023   BP Readings from Last 3 Encounters:  06/01/23 138/70  05/20/23 (!) 162/68  04/28/23 138/64   Pulse Readings from Last 3 Encounters:  06/01/23 70  05/20/23 (!) 49  04/28/23 (!) 50     Medications Reviewed Today     Reviewed  by Manuela Neptune, RPH-CPP (Pharmacist) on 06/17/23 at 0920  Med List Status: <None>   Medication Order Taking? Sig Documenting Provider Last Dose Status Informant  acetaminophen (TYLENOL) 650 MG CR tablet [id number] Yes Take 650 mg by mouth every 8 (eight) hours as needed. [provider] Taking Active   allopurinol (ZYLOPRIM) 100 MG tablet [id number] Yes Take 1 tablet (100 mg total) by mouth daily. Simmons-Robinson, Tawanna Cooler, MD Taking Active   aspirin 81 MG chewable tablet [id number] Yes Chew 81 mg by mouth daily. [provider] Taking Active Child  atorvastatin (LIPITOR) 20 MG tablet [id number] Yes Take 1 tablet (20 mg total) by mouth daily. [name], MD Taking Active   BD SHARPS [facility] MISC [id number]  Dispose of syringes after B12 injections Margaretann Loveless, [place]  Active Child  Blood Glucose Monitoring Suppl (ACCU-CHEK AVIVA PLUS) w/Device KIT [id number]  To check blood sugar once daily Joycelyn Man M, [place]  Active Child  cetirizine (ZYRTEC) 10 MG tablet [id number] Yes Take 10 mg by mouth daily. Take 1 tablet daily as needed for allergies [provider] Taking Active Child  empagliflozin (JARDIANCE) 25 MG TABS tablet [id number] Yes Take 1 tablet (25 mg total) by mouth daily. Simmons-Robinson, Makiera, MD Taking Active   ferrous sulfate 325 (65 FE) MG tablet [id number] Yes Take 325 mg by mouth daily with breakfast. [provider] Taking Active   furosemide (LASIX) 20 MG tablet [id number] Yes Take 1 tablet (20 mg total) by mouth every other day. Sherie Don, NP Taking Active   gabapentin (NEURONTIN) 300 MG capsule [id number] Yes TAKE ONE CAPSULE BY MOUTH TWICE DAILY Ostwalt, Janna, [name] Taking Active   glipiZIDE (GLUCOTROL XL) 10 MG 24 hr tablet [id number] Yes TAKE ONE TABLET BY MOUTH EVERY MORNING WITH BREAKFAST Simmons-Robinson, Makiera, MD Taking Active   glucose blood (ACCU-CHEK AVIVA PLUS) test strip [id number]  USE AS  DIRECTED TO CHECK FASTING BLOOD SUGAR EVERY MORNING Simmons-Robinson, Makiera, MD  Active   metoprolol succinate (TOPROL XL) 25 MG 24 hr tablet [id number] Yes Take 1 tablet (25 mg total) by mouth in the morning and at bedtime. Lanier Prude, MD Taking Active   nitroGLYCERIN (NITROSTAT) 0.4 MG SL tablet [id number]  Place 1 tablet (0.4 mg total) under the tongue every 5 (five) minutes as needed for chest pain. Fransico Michael, Cadence H, [name]  Active Child           Med Note [facility], PHILICIA R   Wed Jul 02, 2022  8:49 AM)    ranolazine (RANEXA) 1000 MG SR tablet [id number] Yes TAKE 1 TABLET BY MOUTH 2 TIMES DAILY Gollan, Tollie Pizza, MD Taking Active   Semaglutide (RYBELSUS) 7 MG TABS [id number] Yes TAKE ONE TABLET (7 MG TOTAL) BY MOUTH DAILY BEFORE BREAKFAST. Simmons-Robinson, Makiera, MD Taking Active   sodium chloride flush (NS) 0.9 % injection 3 mL [id number]   Fransico Michael, Cadence H, [name]  Active   tamsulosin (FLOMAX) 0.4 MG CAPS capsule [id number] Yes Take 1 capsule (0.4 mg total) by mouth daily. Simmons-Robinson, Tawanna Cooler, MD Taking Active               Assessment/Plan:  Based on reported income, patient does not meet criteria for patient assistance for Jardiance or therapeutic alternative Marcelline Deist), but would meet criteria for [employer] Heart Failure fund grant - [employer] Heart [employer] fund is not currently open. However, help patient to sign up for [employer] Heart [employer] fund waitlist today  Diabetes: - Currently uncontrolled, but improved based on latest A1C - Reviewed long term cardiovascular and renal outcomes of uncontrolled blood sugar - Reviewed goal A1c, goal fasting, and goal 2 hour post prandial glucose - Reviewed dietary modifications including:             Importance of having regular well-balanced meals and snacks   Advise patient against skipping meals Encourage patient to avoid sugary beverages (Mtn Dew, semi-sweet tea), such as semi-sweet tea Discuss ideas for more  balanced carbohydrate-protein snacks - Recommend to check glucose, keep log of results and have this record to review at upcoming medical appointments. Patient to contact provider office sooner if needed for readings outside of established parameters or symptoms - Meets financial criteria for Rybelsus patient assistance program through [employer]. Collaborating with provider, CPhT, and patient to pursue assistance Will ask CPhT to mail patient assistance application to patient again  Heart Failure/Hypertension: - Reviewed to weigh daily and when to contact cardiology with weight gain - Recommend to monitor home blood pressure, keep log of results and have this record to review at upcoming medical appointments. Patient to contact provider office sooner if needed for readings outside of established parameters or symptoms    Follow Up Plan: Clinical Pharmacist will follow up with patient by telephone on 07/15/2023 at 2:30 PM    Estelle Grumbles, PharmD, [facility] [phone]

## 2023-06-17 NOTE — Progress Notes (Signed)
   Outreach Note  06/17/2023 Name: [name] Solla [facility] Sr. MRN: [id number] DOB: 03-18-1941  Referred by: [name], MD  Was unable to reach patient via telephone today and have left HIPAA compliant voicemail asking patient to return my call.    Follow Up Plan: Will collaborate with Care Guide to outreach to schedule follow up with me  Estelle Grumbles, PharmD, [facility] [phone]

## 2023-07-02 ENCOUNTER — Other Ambulatory Visit: Payer: Self-pay | Admitting: Pharmacy Technician

## 2023-07-02 DIAGNOSIS — Z5986 Financial insecurity: Secondary | ICD-10-CM

## 2023-07-02 NOTE — Progress Notes (Addendum)
 Pharmacy Medication Assistance Program Note    07/02/2023  Patient ID: [name] Eisner Sr., male   DOB: [birth year], 82 y.o.   MRN: [id number]     07/02/2023  Outreach Medication One  Initial Outreach Date (Medication One) 03/04/2023  Manufacturer Medication One [employer] [facility] Rybelsus  Dose of Rybelsus 7mg   Type of Radiographer, therapeutic Assistance  Date Application Sent to Patient 03/12/2023  Application Items Requested Application;Proof of Income;Other  Date Application Sent to Prescriber 03/12/2023  Name of Prescriber [facility] Simmons-Robinson     Per [facility] PharmD Estelle Grumbles remailed the application to the patient on 07/01/23.  ADDENDUM 09/08/2023 Unsuccessful outreach to patient in regard to [employer] application for Rybelsus. HIPAA compliant v/m was left requesting a return call  Was calling to inquire if patient received the application that was re mailed to him on 07/01/2023.  ADDENDUM 09/18/2023 Second unsuccessful outreach to patient in regard to [employer] application for Rybelsus. HIPAA compliant v/m was left requesting a return call  Was calling to inquire if patient received the application that was re mailed to him on 07/01/2023.  ADDENDUM 09/25/2023 Third overall call. Successful outreach to patient in regard to [employer] application for Rybelsus. HIPAA verified. Patient informs the did not get the 1st application mailed on 03/12/23 and he did not get the 2nd application mailed to him on 07/01/23. Verified patient's address in [facility] is correct. Informed patient another application would be mailed to him on Wednesday of next week. Patient verbalized understanding.   ADDENDUM 10/01/23 Another application was placed in mail to patient today as outlined above.  ADDENDUM 10/15/2023 Third unsuccessful outreach to patient in regard to [employer] application for Rybelsus. HIPAA compliant v/m was left requesting a return call. Was calling to inquire if  patient received the application that has been mailed on 07/01/2023 and again on 10/01/23 Of note, on 10/07/23, [name] PharmD Estelle Grumbles noted that "Today patient confirms received latest application mailed by CPhT last week and plans to work on completing this and mail back to CPhT " Will await return of application.  [name], CPhT [place]  Office: [phone] Fax: [phone] Email: [email or web address]

## 2023-07-14 ENCOUNTER — Other Ambulatory Visit: Payer: Self-pay | Admitting: Family Medicine

## 2023-07-14 DIAGNOSIS — E118 Type 2 diabetes mellitus with unspecified complications: Secondary | ICD-10-CM

## 2023-07-15 ENCOUNTER — Other Ambulatory Visit: Payer: Self-pay | Admitting: Pharmacist

## 2023-07-15 ENCOUNTER — Telehealth: Payer: Self-pay | Admitting: Pharmacist

## 2023-07-15 NOTE — Progress Notes (Signed)
   Outreach Note  07/15/2023 Name: Jermelle Maycock [facility] Sr. MRN: [id number] DOB: 07-11-1941  Referred by: [name], MD  Was unable to reach patient via telephone today and have left HIPAA compliant voicemail asking patient to return my call.    Follow Up Plan: Will collaborate with Care Guide to outreach to schedule follow up with me  Estelle Grumbles, PharmD, [facility] [phone]

## 2023-08-04 DIAGNOSIS — I10 Essential (primary) hypertension: Secondary | ICD-10-CM | POA: Diagnosis not present

## 2023-08-04 DIAGNOSIS — N400 Enlarged prostate without lower urinary tract symptoms: Secondary | ICD-10-CM | POA: Diagnosis not present

## 2023-08-04 DIAGNOSIS — R8290 Unspecified abnormal findings in urine: Secondary | ICD-10-CM | POA: Diagnosis not present

## 2023-08-04 DIAGNOSIS — E785 Hyperlipidemia, unspecified: Secondary | ICD-10-CM | POA: Diagnosis not present

## 2023-08-04 DIAGNOSIS — D631 Anemia in chronic kidney disease: Secondary | ICD-10-CM | POA: Diagnosis not present

## 2023-08-04 DIAGNOSIS — K219 Gastro-esophageal reflux disease without esophagitis: Secondary | ICD-10-CM | POA: Diagnosis not present

## 2023-08-04 DIAGNOSIS — E1122 Type 2 diabetes mellitus with diabetic chronic kidney disease: Secondary | ICD-10-CM | POA: Diagnosis not present

## 2023-08-04 DIAGNOSIS — M1000 Idiopathic gout, unspecified site: Secondary | ICD-10-CM | POA: Diagnosis not present

## 2023-08-05 ENCOUNTER — Telehealth: Payer: Self-pay

## 2023-08-05 NOTE — Progress Notes (Signed)
 Complex Care Management Care Guide Note  08/05/2023 Name: [name] [facility] Sr. MRN: [id number] DOB: 1941/01/26  Kayla Hunt [name] Sr. is a 83 y.o. year old male who is a primary care patient of Simmons-Robinson, Rockie, MD and is actively engaged with the care management team. I reached out to [facility] Sr[facility] by phone today to assist with re-scheduling  with the Pharmacist.  Follow up plan: Telephone appointment with complex care management team member scheduled for:  09/04/2023  Jeoffrey Buffalo , RMA     [place]  [facility], [facility] Guide  Direct Dial: [phone]  Website: [email or web address]

## 2023-08-15 ENCOUNTER — Other Ambulatory Visit: Payer: Self-pay | Admitting: Family Medicine

## 2023-08-15 DIAGNOSIS — E118 Type 2 diabetes mellitus with unspecified complications: Secondary | ICD-10-CM

## 2023-09-01 ENCOUNTER — Ambulatory Visit: Payer: Self-pay | Admitting: Family Medicine

## 2023-09-01 NOTE — Progress Notes (Deleted)
 Established patient visit   Patient: [name] Ratajczak Sr.   DOB: July 25, 1941   83 y.o. Male  MRN: [id number] Visit Date: 09/01/2023  Today's healthcare provider: Rockie Agent, MD   No chief complaint on file.  Subjective       Discussed the use of AI scribe software for clinical note transcription with the patient, who gave verbal consent to proceed.  History of Present Illness             Past Medical History:  Diagnosis Date   Arthritis    Back pain    hx   CAD (coronary artery disease)    a. 2004 s/p CABG x 4 (LIMA->LAD, VG->Diag, VG->OM, VG->RCA); b. 2007 Cath: VG->Diag occluded, otw 3/4 patent grafts.   CKD (chronic kidney disease), stage II-III    Colon polyps    DM2 (diabetes mellitus, type 2) (HCC)    stable   GERD (gastroesophageal reflux disease)    Glaucoma    Gout    HLD (hyperlipidemia)    HTN (hypertension)    Neuropathy     Medications: Outpatient Medications Prior to Visit  Medication Sig   acetaminophen  (TYLENOL ) 650 MG CR tablet Take 650 mg by mouth every 8 (eight) hours as needed.   allopurinol  (ZYLOPRIM ) 100 MG tablet Take 1 tablet (100 mg total) by mouth daily.   aspirin  81 MG chewable tablet Chew 81 mg by mouth daily.   atorvastatin  (LIPITOR) 20 MG tablet Take 1 tablet (20 mg total) by mouth daily.   BD SHARPS CONTAINER HOME MISC Dispose of syringes after B12 injections   Blood Glucose Monitoring Suppl (ACCU-CHEK AVIVA PLUS) w/Device KIT To check blood sugar once daily   cetirizine (ZYRTEC) 10 MG tablet Take 10 mg by mouth daily. Take 1 tablet daily as needed for allergies   ferrous sulfate  325 (65 FE) MG tablet Take 325 mg by mouth daily with breakfast.   furosemide  (LASIX ) 20 MG tablet Take 1 tablet (20 mg total) by mouth every other day.   gabapentin  (NEURONTIN ) 300 MG capsule TAKE ONE CAPSULE BY MOUTH TWICE DAILY   glipiZIDE  (GLUCOTROL  XL) 10 MG 24 hr tablet TAKE ONE TABLET BY MOUTH EVERY MORNING WITH BREAKFAST    glucose blood (ACCU-CHEK GUIDE TEST) test strip USE AS DIRECTED TO CHECK FASTING BLOOD SUGAR EVERY MORNING   JARDIANCE  25 MG TABS tablet TAKE ONE TABLET (25 MG TOTAL) BY MOUTH DAILY.   metoprolol  succinate (TOPROL  XL) 25 MG 24 hr tablet Take 1 tablet (25 mg total) by mouth in the morning and at bedtime.   nitroGLYCERIN  (NITROSTAT ) 0.4 MG SL tablet Place 1 tablet (0.4 mg total) under the tongue every 5 (five) minutes as needed for chest pain.   ranolazine  (RANEXA ) 1000 MG SR tablet TAKE 1 TABLET BY MOUTH 2 TIMES DAILY   Semaglutide  (RYBELSUS ) 7 MG TABS TAKE ONE TABLET (7 MG TOTAL) BY MOUTH DAILY BEFORE BREAKFAST.   tamsulosin  (FLOMAX ) 0.4 MG CAPS capsule Take 1 capsule (0.4 mg total) by mouth daily.   Facility-Administered Medications Prior to Visit  Medication Dose Route Frequency Provider   sodium chloride  flush (NS) 0.9 % injection 3 mL  3 mL Intravenous Q12H Furth, Cadence H, [name]    Review of Systems  {Insert previous labs (optional):23779} {See past labs  Heme  Chem  Endocrine  Serology  Results Review (optional):1}   Objective    There were no vitals taken for this visit. {Insert last BP/Wt (optional):23777}{See vitals  history (optional):1}    Physical Exam  ***  No results found for any visits on 09/01/23.  Assessment & Plan     Problem List Items Addressed This Visit   None   Assessment and Plan              No follow-ups on file.         Rockie Agent, MD  [facility] [phone] (phone) [phone] (fax)  [facility]

## 2023-09-04 ENCOUNTER — Other Ambulatory Visit: Payer: Self-pay | Admitting: Pharmacist

## 2023-09-04 NOTE — Patient Instructions (Addendum)
Goals Addressed             This Visit's Progress    Pharmacy Goals       Our goal A1c is less than 7%. This corresponds with fasting sugars less than 130 and 2 hour after meal sugars less than 180. Please keep a log of your results when checking your blood sugar   Our goal bad cholesterol, or LDL, is less than 70. This is why it is important to continue taking your atorvastatin.  Please watch the mail for an envelope from [employer] containing the patient assistance program application. Please complete this application and mail back to [facility] Pharmacy Technician Noreene Larsson [place] along with a copy of your Medicare Part D prescription card and a copy of your proof of income document.  Estelle Grumbles, PharmD, [facility] [phone]

## 2023-09-04 NOTE — Progress Notes (Signed)
 09/04/2023 Name: [name] Crisostomo [facility] Sr. MRN: [id number] DOB: 1940-09-01  Chief Complaint  Patient presents with   Medication Assistance   Medication Management    [name] Sahni Sr. is a 83 y.o. year old male who presented for a telephone visit.   They were referred to the pharmacist by their PCP for assistance in managing diabetes and complex medication management.      Subjective:   Care Team: Primary Care Provider: [name], MD Cardiologist: Perla Lye, MD Electrophysiologist: Cindie Smalls, MD Nephrologist: Dominica Brandy, MD; Next Scheduled Visit: 12/01/2023  Medication Access/Adherence  Current Pharmacy:  [facility] - [place], [place] - [address] [place] [address] Phone: [phone] Fax: [phone]  [facility] [address] [place] [address] Phone: [phone] Fax: [phone]  [id number] - [place] [facility] - [address] [place] [address] Phone: [phone] Fax: [phone]   Patient reports affordability concerns with their medications: Yes  Patient reports access/transportation concerns to their pharmacy: No  Patient reports adherence concerns with their medications:  No      Reports received a letter from his [facility] plan stating that his Rybelsus  prescription would not be covered for the next refill without action from his provider   Diabetes:   Current medications:  - Jardiance  25 mg daily - glipizide  ER 10 mg daily with breakfast - Rybelsus  7 mg daily before breakfast              Reports tolerating well Confirms takes on an empty stomach, >=30 minutes before the first food, beverage, or other oral medications of the day with <=4 oz of plain water  only              Medications tried in the past: metformin  (CKD)   Current glucose readings: morning fasting ranging: 151-200; 183 today    Patient denies hypoglycemic s/sx including dizziness, shakiness, sweating.     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   Statin therapy: atorvastatin  20 mg daily   Current medication access support: Collaborating with provider, CPhT, and patient to pursue assistance for Rybelsus  patient assistance program through [employer] - Today patient denies having completed this application; note CPhT mailed second copy of application to patient on 06/30/2024     HFmrEF /Hypertension/orthostatic hypotension:    Current medications:  SGLT2i: Jardiance  25 mg daily Beta blocker: metoprolol  ER 25 mg twice daily Diuretic regimen: furosemide  20 mg every other day   Report has home BP monitor, but denies monitoring recently  Reports home weight has been stable   Denies symptoms of hypotension, such as dizziness or lightheadedness   Patient denies volume overload signs or symptoms including shortness of breath, lower extremity edema, increased use of pillows at night     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     Objective:  Lab Results  Component Value Date   HGBA1C 8.0 (A) 06/01/2023    Lab Results  Component Value Date   CREATININE 2.05 (H) 02/04/2023   BUN 26 02/04/2023   NA 139 02/04/2023   K 5.4 (H) 02/04/2023   CL 103 02/04/2023   CO2 21 02/04/2023    Lab Results  Component Value Date   CHOL 130 03/27/2023   HDL 40 03/27/2023   LDLCALC 61 03/27/2023   TRIG 169 (H) 03/27/2023  CHOLHDL 3.3 03/27/2023   BP Readings from Last 3 Encounters:  06/01/23 138/70  05/20/23 (!) 162/68  04/28/23 138/64   Pulse Readings from Last 3 Encounters:  06/01/23 70  05/20/23 (!) 49  04/28/23 (!) 50     Medications Reviewed Today     Reviewed by Alana Sharyle LABOR, RPH-CPP (Pharmacist) on 09/04/23 at 1741  Med List Status: <None>   Medication Order Taking? Sig  Documenting Provider Last Dose Status Informant  acetaminophen  (TYLENOL ) 650 MG CR tablet [id number]  Take 650 mg by mouth every 8 (eight) hours as needed. [provider]  Active   allopurinol  (ZYLOPRIM ) 100 MG tablet [id number]  Take 1 tablet (100 mg total) by mouth daily. Simmons-Robinson, Rockie, MD  Active   aspirin  81 MG chewable tablet [id number]  Chew 81 mg by mouth daily. [provider]  Active Child  atorvastatin  (LIPITOR) 20 MG tablet [id number]  Take 1 tablet (20 mg total) by mouth daily. Sharma Rockie, MD  Active   BD SHARPS [facility] MISC [phone]  Dispose of syringes after B12 injections Burnette, Jennifer M, [name]  Active Child  Blood Glucose Monitoring Suppl (ACCU-CHEK AVIVA PLUS) w/Device KIT [id number]  To check blood sugar once daily Burnette, Jennifer M, [name]  Active Child  cetirizine (ZYRTEC) 10 MG tablet [id number]  Take 10 mg by mouth daily. Take 1 tablet daily as needed for allergies [provider]  Active Child  ferrous sulfate  325 (65 FE) MG tablet [id number]  Take 325 mg by mouth daily with breakfast. [provider]  Active   furosemide  (LASIX ) 20 MG tablet [id number] Yes Take 1 tablet (20 mg total) by mouth every other day. Riddle, Suzann, NP Taking Active   gabapentin  (NEURONTIN ) 300 MG capsule [id number]  TAKE ONE CAPSULE BY MOUTH TWICE DAILY Ostwalt, Janna, [name]  Active   glipiZIDE  (GLUCOTROL  XL) 10 MG 24 hr tablet [id number] Yes TAKE ONE TABLET BY MOUTH EVERY MORNING WITH BREAKFAST Simmons-Robinson, Makiera, MD Taking Active   glucose blood (ACCU-CHEK GUIDE TEST) test strip [id number]  USE AS DIRECTED TO CHECK FASTING BLOOD SUGAR EVERY MORNING Simmons-Robinson, Makiera, MD  Active   JARDIANCE  25 MG TABS tablet [id number] Yes TAKE ONE TABLET (25 MG TOTAL) BY MOUTH DAILY. Simmons-Robinson, Makiera, MD Taking Active   metoprolol  succinate (TOPROL  XL) 25 MG 24 hr tablet [id number] Yes Take 1 tablet (25 mg total) by mouth in the  morning and at bedtime. Cindie Ole DASEN, MD Taking Active   nitroGLYCERIN  (NITROSTAT ) 0.4 MG SL tablet [id number]  Place 1 tablet (0.4 mg total) under the tongue every 5 (five) minutes as needed for chest pain. Franchester, Cadence H, [name]  Active Child           Med Note ARLYSS, PHILICIA R   Wed Jul 02, 2022  8:49 AM)    ranolazine  (RANEXA ) 1000 MG SR tablet [id number]  TAKE 1 TABLET BY MOUTH 2 TIMES DAILY Gollan, Timothy J, MD  Active   Semaglutide  (RYBELSUS ) 7 MG TABS [id number] Yes TAKE ONE TABLET (7 MG TOTAL) BY MOUTH DAILY BEFORE BREAKFAST. Simmons-Robinson, Rockie, MD Taking Active   sodium chloride  flush (NS) 0.9 % injection 3 mL [id number]   Franchester, Cadence H, [name]  Active   tamsulosin  (FLOMAX ) 0.4 MG CAPS capsule [id number]  Take 1 capsule (0.4 mg total) by mouth daily. Simmons-Robinson, Rockie, MD  Active               Assessment/Plan:   Encourage patient to contact the office  to reschedule missed appointment with PCP  From review of formulary from [facility] website for patient's plan, note that Rybelsus  is covered, but requires a prior authorization  Submit PA request for patient via covermymeds today; PA approved through 07/27/2024  Diabetes: - Currently uncontrolled, but improved based on latest A1C - Reviewed long term cardiovascular and renal outcomes of uncontrolled blood sugar - Reviewed goal A1c, goal fasting, and goal 2 hour post prandial glucose - Reviewed dietary modifications including:             Importance of having regular well-balanced meals and snacks  Encourage patient to avoid sugary beverages (Mtn Dew, semi-sweet tea), such as sweet tea Discuss ideas for more balanced carbohydrate-protein snacks - Recommend to check glucose, keep log of results and have this record to review at upcoming medical appointments. Patient to contact provider office sooner if needed for readings outside of established parameters or symptoms - Meets financial criteria for  Rybelsus  patient assistance program through [employer]. Collaborating with provider, CPhT, and patient to pursue assistance Patient to complete application and mail back to CPhT. Again provide patient with phone number with CPhT in case he needs to follow up regarding questions or to request a new application - Note based on reported income, patient does not meet criteria for patient assistance for Jardiance  or therapeutic alternative (Farxiga ). Have helped patient to sign up for [employer] Heart Failure fund waitlist   Heart Failure/Hypertension: - Reviewed to weigh daily and when to contact cardiology with weight gain - Recommend to monitor home blood pressure, keep log of results and have this record to review at upcoming medical appointments. Patient to contact provider office sooner if needed for readings outside of established parameters or symptoms     Follow Up Plan: Clinical Pharmacist will follow up with patient by telephone on 10/07/2023 at 1:30 PM     Sharyle Sia, PharmD, [facility] [phone]

## 2023-09-24 ENCOUNTER — Ambulatory Visit: Payer: Medicare PPO | Admitting: Family Medicine

## 2023-09-24 ENCOUNTER — Encounter: Payer: Self-pay | Admitting: Family Medicine

## 2023-09-24 VITALS — BP 124/60 | HR 53 | Ht 68.0 in | Wt 175.0 lb

## 2023-09-24 DIAGNOSIS — E785 Hyperlipidemia, unspecified: Secondary | ICD-10-CM

## 2023-09-24 DIAGNOSIS — I152 Hypertension secondary to endocrine disorders: Secondary | ICD-10-CM

## 2023-09-24 DIAGNOSIS — Z7984 Long term (current) use of oral hypoglycemic drugs: Secondary | ICD-10-CM

## 2023-09-24 DIAGNOSIS — E1169 Type 2 diabetes mellitus with other specified complication: Secondary | ICD-10-CM

## 2023-09-24 DIAGNOSIS — I25118 Atherosclerotic heart disease of native coronary artery with other forms of angina pectoris: Secondary | ICD-10-CM | POA: Diagnosis not present

## 2023-09-24 DIAGNOSIS — E1159 Type 2 diabetes mellitus with other circulatory complications: Secondary | ICD-10-CM | POA: Diagnosis not present

## 2023-09-24 DIAGNOSIS — N1831 Chronic kidney disease, stage 3a: Secondary | ICD-10-CM | POA: Diagnosis not present

## 2023-09-24 NOTE — Assessment & Plan Note (Signed)
 Blood pressure improved to 124/60 mmHg upon recheck. Diet includes occasional high-sodium foods like country ham, contributing to elevated blood pressure. Discussed importance of reducing sodium intake. Chronic  - Monitor blood pressure regularly at home - Continue Metoprolol 25 mg, Lasix 20 mg - Encourage dietary modifications to reduce sodium intake

## 2023-09-24 NOTE — Assessment & Plan Note (Signed)
 Chronic Kidney Disease Under nephrologist care with follow-up in May. 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 Will monitor kidney function and creatinine levels. - Follow-up with nephrologist in May - Monitor kidney function and creatinine levels

## 2023-09-24 NOTE — Assessment & Plan Note (Signed)
 Difficulty controlling blood sugar levels despite dietary modifications and current medication regimen. Morning blood sugar levels range from 171 to over 200 mg/dL. Last A1c was 8% in November. Currently on Rybelsus 7 mg daily, Glipizide 10 mg, and Jardiance 25 mg. Reluctant to start insulin but open to it if necessary. Discussed importance of maintaining A1c in the 7% range to prevent complications. Informed about Rybelsus cost and patient assistance options, but income exceeds eligibility. - Order A1c test - Schedule follow-up in 3 months - Continue Rybelsus 7 mg daily, Glipizide 10 mg, Jardiance 25 mg - Send letter to [facility] for recent eye exam records

## 2023-09-24 NOTE — Assessment & Plan Note (Signed)
 Chronic  On multiple medications for CAD including Aspirin 81 mg, Atorvastatin 20 mg, Metoprolol 25 mg, Nitroglycerin, and Ranexa 1000 mg twice daily. Discussed importance of medication adherence to prevent cardiac events. - Continue Aspirin 81 mg, Atorvastatin 20 mg, Metoprolol 25 mg, Nitroglycerin, Ranexa 1000 mg twice daily

## 2023-09-24 NOTE — Progress Notes (Signed)
 Established patient visit   Patient: [name] Falco Sr.   DOB: 09/02/1940   83 y.o. Male  MRN: [id number] Visit Date: 09/24/2023  Today's healthcare provider: [name], MD   Chief Complaint  Patient presents with   Health Maintenance    Denied all vaccine    Subjective     HPI     Health Maintenance    Additional comments: Denied all vaccine       Last edited by Thedora Hinders, CMA on 09/24/2023  2:16 PM.       Discussed the use of AI scribe software for clinical note transcription with the patient, who gave verbal consent to proceed.  History of Present Illness   [name] Xiang Sr. is an 83 year old male with diabetes who presents with difficulty managing blood sugar levels.  He has been experiencing difficulty managing his blood sugar levels despite making dietary modifications. His blood sugar was 171 mg/dL this morning and has exceeded 200 mg/dL on some mornings over the past two weeks. He has been avoiding sweets and potatoes, which he enjoys, to help control his blood sugar. He has also started eating supper every night around 5:30 or 6:00 PM to better manage his glucose levels. His last A1c was 8% in November, and he checks his blood sugar daily, noting fluctuations with occasional high readings. He prefers to avoid insulin injections unless absolutely necessary.  He is currently taking Rybelsus 7 mg daily, which he takes every morning 30 minutes before breakfast with water. He notes the medication is expensive and he does not qualify for patient assistance due to his pension income. He also takes glipizide 10 mg and Jardiance 25 mg for diabetes management. Additionally, he takes allopurinol 100 mg for chronic gout, aspirin 81 mg for heart disease, atorvastatin 20 mg for cholesterol, Lasix 20 mg for heart issues, metoprolol 25 mg for coronary artery disease, nitroglycerin for coronary artery disease, Ranexa 1000 mg twice daily, and  Flomax for benign prostatic hyperplasia.  He is retired with a pension from the state and receives [employer], totaling almost $5000 a month. 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 He occasionally eats country ham biscuits and chicken sandwiches but tries to limit high sodium foods.  He mentions a recent eye exam at [facility] a couple of months ago, where he was told his eyes were doing well.         Past Medical History:  Diagnosis Date   Arthritis    Back pain    hx   CAD (coronary artery disease)    a. 2004 s/p CABG x 4 (LIMA->LAD, VG->Diag, VG->OM, VG->RCA); b. 2007 Cath: VG->Diag occluded, otw 3/4 patent grafts.   CKD (chronic kidney disease), stage II-III    Colon polyps    DM2 (diabetes mellitus, type 2) (HCC)    stable   GERD (gastroesophageal reflux disease)    Glaucoma    Gout    HLD (hyperlipidemia)    HTN (hypertension)    Neuropathy     Medications: Outpatient Medications Prior to Visit  Medication Sig   acetaminophen (TYLENOL) 650 MG CR tablet Take 650 mg by mouth every 8 (eight) hours as needed.   allopurinol (ZYLOPRIM) 100 MG tablet Take 1 tablet (100 mg total) by mouth daily.   aspirin 81 MG chewable tablet Chew 81 mg by mouth daily.   atorvastatin (LIPITOR) 20 MG tablet  Take 1 tablet (20 mg total) by mouth daily.   BD SHARPS CONTAINER HOME MISC Dispose of syringes after B12 injections   Blood Glucose Monitoring Suppl (ACCU-CHEK AVIVA PLUS) w/Device KIT To check blood sugar once daily   cetirizine (ZYRTEC) 10 MG tablet Take 10 mg by mouth daily. Take 1 tablet daily as needed for allergies   ferrous sulfate 325 (65 FE) MG tablet Take 325 mg by mouth daily with breakfast.   furosemide (LASIX) 20 MG tablet Take 1 tablet (20 mg total) by mouth every other day.   gabapentin (NEURONTIN) 300 MG capsule TAKE ONE CAPSULE BY MOUTH TWICE DAILY   glipiZIDE (GLUCOTROL XL) 10 MG 24 hr tablet TAKE ONE TABLET BY MOUTH  EVERY MORNING WITH BREAKFAST   glucose blood (ACCU-CHEK GUIDE TEST) test strip USE AS DIRECTED TO CHECK FASTING BLOOD SUGAR EVERY MORNING   JARDIANCE 25 MG TABS tablet TAKE ONE TABLET (25 MG TOTAL) BY MOUTH DAILY.   metoprolol succinate (TOPROL XL) 25 MG 24 hr tablet Take 1 tablet (25 mg total) by mouth in the morning and at bedtime.   nitroGLYCERIN (NITROSTAT) 0.4 MG SL tablet Place 1 tablet (0.4 mg total) under the tongue every 5 (five) minutes as needed for chest pain.   ranolazine (RANEXA) 1000 MG SR tablet TAKE 1 TABLET BY MOUTH 2 TIMES DAILY   Semaglutide (RYBELSUS) 7 MG TABS TAKE ONE TABLET (7 MG TOTAL) BY MOUTH DAILY BEFORE BREAKFAST.   tamsulosin (FLOMAX) 0.4 MG CAPS capsule Take 1 capsule (0.4 mg total) by mouth daily.   Facility-Administered Medications Prior to Visit  Medication Dose Route Frequency Provider   sodium chloride flush (NS) 0.9 % injection 3 mL  3 mL Intravenous Q12H Furth, Cadence H, [name]    Review of Systems  Last CBC Lab Results  Component Value Date   WBC 8.8 07/30/2022   HGB 12.3 (L) 07/30/2022   HCT 36.7 (L) 07/30/2022   MCV 93.9 07/30/2022   MCH 31.5 07/30/2022   RDW 14.8 07/30/2022   PLT 237 07/30/2022   Last metabolic panel Lab Results  Component Value Date   GLUCOSE 198 (H) 02/04/2023   NA 139 02/04/2023   K 5.4 (H) 02/04/2023   CL 103 02/04/2023   CO2 21 02/04/2023   BUN 26 02/04/2023   CREATININE 2.05 (H) 02/04/2023   EGFR 32 (L) 02/04/2023   CALCIUM 9.6 02/04/2023   PROT 5.9 (L) 06/16/2022   ALBUMIN 3.5 06/16/2022   LABGLOB 1.8 05/16/2021   AGRATIO 2.7 (H) 05/16/2021   BILITOT 0.6 06/16/2022   ALKPHOS 58 06/16/2022   AST 27 06/16/2022   ALT 52 (H) 06/16/2022   ANIONGAP 12 07/30/2022   Last lipids Lab Results  Component Value Date   CHOL 130 03/27/2023   HDL 40 03/27/2023   LDLCALC 61 03/27/2023   TRIG 169 (H) 03/27/2023   CHOLHDL 3.3 03/27/2023   Last hemoglobin A1c Lab Results  Component Value Date   HGBA1C 8.0 (A)  06/01/2023   Last thyroid functions Lab Results  Component Value Date   TSH 3.840 04/21/2018   Last vitamin D No results found for: "25OHVITD2", "25OHVITD3", "VD25OH"      Objective    BP 124/60 (Cuff Size: Normal)   Pulse (!) 53   Ht 5\' 8"  (1.727 m)   Wt 175 lb (79.4 kg)   SpO2 98%   BMI 26.61 kg/m  BP Readings from Last 3 Encounters:  09/24/23 124/60  06/01/23 138/70  05/20/23 (!) 162/68  Wt Readings from Last 3 Encounters:  09/24/23 175 lb (79.4 kg)  06/01/23 163 lb 3.2 oz (74 kg)  05/20/23 161 lb 3.2 oz (73.1 kg)        Physical Exam Vitals reviewed.  Constitutional:      General: He is not in acute distress.    Appearance: Normal appearance. He is not ill-appearing, toxic-appearing or diaphoretic.  Eyes:     Conjunctiva/sclera: Conjunctivae normal.  Cardiovascular:     Rate and Rhythm: Normal rate and regular rhythm.     Pulses: Normal pulses.     Heart sounds: Normal heart sounds. No murmur heard.    No friction rub. No gallop.  Pulmonary:     Effort: Pulmonary effort is normal. No respiratory distress.     Breath sounds: Normal breath sounds. No stridor. No wheezing, rhonchi or rales.  Abdominal:     General: Bowel sounds are normal. There is no distension.     Palpations: Abdomen is soft.     Tenderness: There is no abdominal tenderness.  Musculoskeletal:     Right lower leg: No edema.     Left lower leg: No edema.  Skin:    Findings: No erythema or rash.  Neurological:     Mental Status: He is alert and oriented to person, place, and time.  Psychiatric:        Mood and Affect: Mood and affect normal.        Speech: Speech normal.        Behavior: Behavior normal. Behavior is cooperative.       No results found for any visits on 09/24/23.  Assessment & Plan     Problem List Items Addressed This Visit       Cardiovascular and Mediastinum   Hypertension associated with diabetes (HCC)   Blood pressure improved to 124/60 mmHg upon  recheck. Diet includes occasional high-sodium foods like country ham, contributing to elevated blood pressure. Discussed importance of reducing sodium intake. Chronic  - Monitor blood pressure regularly at home - Continue Metoprolol 25 mg, Lasix 20 mg - Encourage dietary modifications to reduce sodium intake      CAD (coronary artery disease)   Chronic  On multiple medications for CAD including Aspirin 81 mg, Atorvastatin 20 mg, Metoprolol 25 mg, Nitroglycerin, and Ranexa 1000 mg twice daily. Discussed importance of medication adherence to prevent cardiac events. - Continue Aspirin 81 mg, Atorvastatin 20 mg, Metoprolol 25 mg, Nitroglycerin, Ranexa 1000 mg twice daily        Endocrine   Type 2 diabetes mellitus with hyperlipidemia (HCC) - Primary   Difficulty controlling blood sugar levels despite dietary modifications and current medication regimen. Morning blood sugar levels range from 171 to over 200 mg/dL. Last A1c was 8% in November. Currently on Rybelsus 7 mg daily, Glipizide 10 mg, and Jardiance 25 mg. Reluctant to start insulin but open to it if necessary. Discussed importance of maintaining A1c in the 7% range to prevent complications. Informed about Rybelsus cost and patient assistance options, but income exceeds eligibility. - Order A1c test - Schedule follow-up in 3 months - Continue Rybelsus 7 mg daily, Glipizide 10 mg, Jardiance 25 mg - Send letter to [facility] for recent eye exam records      Relevant Orders   Hemoglobin A1c       Benign Prostatic Hyperplasia Currently taking Flomax for BPH. No new symptoms reported. - Continue Flomax  General Health Maintenance Had an eye exam a couple of months ago  at [facility]. - Send letter to [facility] for recent eye exam records     Return in about 3 months (around 12/22/2023) for DM, HTN.         [name], MD  [facility] [phone] (phone) [phone] (fax)  [facility]

## 2023-09-25 LAB — HEMOGLOBIN A1C
Est. average glucose Bld gHb Est-mCnc: 186 mg/dL
Hgb A1c MFr Bld: 8.1 % — ABNORMAL HIGH (ref 4.8–5.6)

## 2023-09-28 ENCOUNTER — Other Ambulatory Visit: Payer: Self-pay | Admitting: Cardiovascular Disease

## 2023-10-07 ENCOUNTER — Telehealth: Payer: Self-pay | Admitting: Family Medicine

## 2023-10-07 ENCOUNTER — Other Ambulatory Visit: Payer: Self-pay | Admitting: Family Medicine

## 2023-10-07 ENCOUNTER — Other Ambulatory Visit: Payer: Self-pay | Admitting: Pharmacist

## 2023-10-07 DIAGNOSIS — I5042 Chronic combined systolic (congestive) and diastolic (congestive) heart failure: Secondary | ICD-10-CM

## 2023-10-07 DIAGNOSIS — E118 Type 2 diabetes mellitus with unspecified complications: Secondary | ICD-10-CM

## 2023-10-07 DIAGNOSIS — E1169 Type 2 diabetes mellitus with other specified complication: Secondary | ICD-10-CM

## 2023-10-07 MED ORDER — RYBELSUS 14 MG PO TABS
14.0000 mg | ORAL_TABLET | Freq: Every day | ORAL | 6 refills | Status: DC
Start: 2023-10-07 — End: 2024-05-16

## 2023-10-07 NOTE — Telephone Encounter (Signed)
 Copied from CRM [id number]. Topic: Appointments - Scheduling Inquiry for Clinic >> Oct 07, 2023  4:20 PM Nyra Capes wrote: Reason for CRM: patient calling stating he received a call from nurse and is returning the call. It's hard to hear the patient talk., patient is asking what the call is about. Patient is confused as to why he missed a call from [place].    Called [facility] and appt was scheduled with Estelle Grumbles pharmacist.

## 2023-10-07 NOTE — Telephone Encounter (Signed)
 Updated rybelsus dose to 14mg  daily

## 2023-10-07 NOTE — Progress Notes (Signed)
 10/07/2023 Name: [name] [facility] Sr. MRN: [id number] DOB: Jan 16, 1941  Chief Complaint  Patient presents with   Medication Management   Medication Assistance    [name] [facility] Sr. is a 83 y.o. year old male who presented for a telephone visit.   They were referred to the pharmacist by their PCP for assistance in managing diabetes and complex medication management.   Conversation limited today as patient not currently home during our call  Subjective:  Care Team: Primary Care Provider: [name], MD; Next Scheduled Visit: 12/25/2023 Cardiologist: Julien Nordmann, MD Electrophysiologist: Steffanie Dunn, MD Nephrologist: Lorain Childes, MD; Next Scheduled Visit: 12/01/2023  Medication Access/Adherence  Current Pharmacy:  [facility] - [place], [place] - [address] [place] [address] Phone: [phone] Fax: [phone]  [facility] [address] [place] [address] Phone: [phone] Fax: [phone]  [id number] - [place] [facility] - [address] [place] [address] Phone: [phone] Fax: [phone]   Patient reports affordability concerns with their medications: Yes  Patient reports access/transportation concerns to their pharmacy: No  Patient reports adherence concerns with their medications:  No      Diabetes:   Current medications:  - Jardiance 25 mg daily - glipizide ER 10 mg daily with breakfast - Rybelsus 7 mg daily before breakfast              Reports tolerating well Confirms takes on an empty stomach, >=30 minutes before the first food, beverage, or other oral medications of the day with <=4 oz of plain water only              Medications tried in the past: metformin (CKD)   Current glucose readings: morning fasting ranging: 130-200; 130 today   Patient denies hypoglycemic s/sx including dizziness, shakiness,  sweating.     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   Statin therapy: atorvastatin 20 mg daily   Current medication access support: Collaborating with provider, CPhT, and patient to pursue assistance for Rybelsus patient assistance program through [employer] - Today patient confirms received latest application mailed by CPhT last week and plans to work on completing this and mail back to CPhT  HFmrEF /Hypertension/orthostatic hypotension:    Current medications:  SGLT2i: Jardiance 25 mg daily Beta blocker: metoprolol ER 25 mg twice daily Diuretic regimen: furosemide 20 mg every other day   Report has home BP monitor, but does not have home readings to share today  Denies symptoms of hypotension, such as dizziness or lightheadedness as long as takes positional changes slowly   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    Objective:  Lab Results  Component Value Date   HGBA1C 8.1 (H) 09/24/2023    Lab Results  Component Value Date   CREATININE 2.05 (H) 02/04/2023   BUN 26 02/04/2023   NA 139 02/04/2023   K 5.4 (H) 02/04/2023   CL 103 02/04/2023   CO2 21 02/04/2023    Lab Results  Component Value Date   CHOL 130 03/27/2023   HDL 40 03/27/2023   LDLCALC 61 03/27/2023   TRIG 169 (H) 03/27/2023   CHOLHDL 3.3 03/27/2023   BP Readings from Last 3 Encounters:  09/24/23 124/60  06/01/23 138/70  05/20/23 (!) 162/68   Pulse Readings from Last 3 Encounters:  09/24/23 (!) 53  06/01/23 70  05/20/23 (!) 49     Current Outpatient Medications on File Prior to Visit  Medication Sig Dispense Refill   acetaminophen (TYLENOL) 650 MG CR tablet Take 650 mg by mouth every 8 (eight) hours as needed.     allopurinol (ZYLOPRIM) 100 MG tablet Take 1 tablet (100 mg total) by mouth daily. 90 tablet 2   aspirin 81 MG chewable tablet Chew 81 mg by mouth daily.      atorvastatin (LIPITOR) 20 MG tablet Take 1 tablet (20 mg total) by mouth daily. 90 tablet 3   BD SHARPS CONTAINER HOME MISC Dispose of syringes after B12 injections 1 each 5   Blood Glucose Monitoring Suppl (ACCU-CHEK AVIVA PLUS) w/Device KIT To check blood sugar once daily 1 kit 0   cetirizine (ZYRTEC) 10 MG tablet Take 10 mg by mouth daily. Take 1 tablet daily as needed for allergies     ferrous sulfate 325 (65 FE) MG tablet Take 325 mg by mouth daily with breakfast.     furosemide (LASIX) 20 MG tablet Take 1 tablet (20 mg total) by mouth every other day. 90 tablet 3   gabapentin (NEURONTIN) 300 MG capsule TAKE ONE CAPSULE BY MOUTH TWICE DAILY 180 capsule 1   glipiZIDE (GLUCOTROL XL) 10 MG 24 hr tablet TAKE ONE TABLET BY MOUTH EVERY MORNING WITH BREAKFAST 90 tablet 1   glucose blood (ACCU-CHEK GUIDE TEST) test strip USE AS DIRECTED TO CHECK FASTING BLOOD SUGAR EVERY MORNING 100 strip 6   JARDIANCE 25 MG TABS tablet TAKE ONE TABLET (25 MG TOTAL) BY MOUTH DAILY. 90 tablet 1   metoprolol succinate (TOPROL XL) 25 MG 24 hr tablet Take 1 tablet (25 mg total) by mouth in the morning and at bedtime. 180 tablet 3   nitroGLYCERIN (NITROSTAT) 0.4 MG SL tablet Place 1 tablet (0.4 mg total) under the tongue every 5 (five) minutes as needed for chest pain. 25 tablet 3   ranolazine (RANEXA) 1000 MG SR tablet TAKE ONE TABLET BY MOUTH TWO TIMES DAILY 180 tablet 3   Semaglutide (RYBELSUS) 7 MG TABS TAKE ONE TABLET (7 MG TOTAL) BY MOUTH DAILY BEFORE BREAKFAST. 30 tablet 6   tamsulosin (FLOMAX) 0.4 MG CAPS capsule Take 1 capsule (0.4 mg total) by mouth daily. 90 capsule 3     Assessment/Plan:   Diabetes: - Currently uncontrolled, but improved based on latest A1C - Reviewed long term cardiovascular and renal outcomes of uncontrolled blood sugar - Reviewed goal A1c, goal fasting, and goal 2 hour post prandial glucose - Reviewed dietary modifications including:             Importance of having regular  well-balanced meals and snacks  Encourage patient to avoid sugary beverages (Mtn Dew, semi-sweet tea), such as sweet tea, drinking water instead - Meets financial criteria for Rybelsus patient assistance program through [employer]. Collaborating with provider, CPhT, and patient to pursue assistance Patient to complete application and mail back to CPhT. Again provide patient with phone number with CPhT in case he needs to follow up regarding questions or to request a new application - Note based on reported income, patient does not meet criteria for patient assistance for Jardiance or therapeutic alternative Marcelline Deist). Have helped patient to sign up for [employer] Heart Failure fund waitlist - Collaborate with PCP as patient expresses interest in increasing Rybelsus dose to 14 mg daily to aid with blood sugar control  Provider agrees and sends prescription for Rybelsus 14 mg daily to pharmacy for patient  Follow up with patient to provide this update - Recommend to check glucose, keep log of results and have this record to review at upcoming medical appointments. Patient to contact provider office sooner if needed for readings outside of established parameters or symptoms  Heart Failure/Hypertension: - Reviewed to weigh daily and when to contact cardiology with weight gain - Remind patient to take positional changes slowly - Recommend to monitor home blood pressure, keep log of results and have this record to review at upcoming medical appointments. Patient to contact provider office sooner if needed for readings outside of established parameters or symptoms  Follow Up Plan: Clinical Pharmacist will follow up with patient by telephone next month    Estelle Grumbles, PharmD, [facility] [phone]

## 2023-10-07 NOTE — Patient Instructions (Signed)
 Goals Addressed             This Visit's Progress    Pharmacy Goals       Our goal A1c is less than 7%. This corresponds with fasting sugars less than 130 and 2 hour after meal sugars less than 180. Please keep a log of your results when checking your blood sugar   Our goal bad cholesterol, or LDL, is less than 70. This is why it is important to continue taking your atorvastatin.  Please complete the application for [employer] patient assistance and mail back to [facility] Technician Noreene Larsson Simcox along with a copy of your Medicare Part D prescription card and a copy of your proof of income document.  Estelle Grumbles, PharmD, [facility] [phone]

## 2023-10-07 NOTE — Telephone Encounter (Signed)
 Spoke with patient today for appointment as scheduled

## 2023-10-26 ENCOUNTER — Other Ambulatory Visit: Payer: Self-pay | Admitting: Physician Assistant

## 2023-10-26 DIAGNOSIS — G629 Polyneuropathy, unspecified: Secondary | ICD-10-CM

## 2023-11-11 ENCOUNTER — Telehealth: Payer: Self-pay | Admitting: Pharmacist

## 2023-11-11 ENCOUNTER — Other Ambulatory Visit: Payer: Self-pay | Admitting: Pharmacist

## 2023-11-11 NOTE — Progress Notes (Signed)
   Outreach Note  11/11/2023 Name: [name] [facility] Sr. MRN: [id number] DOB: [birth year]  Referred by: Mimi Alt, MD  Was unable to reach patient via telephone today. Outreach to patient twice, but each time, phone is hung up on caller.  Arthur Lash, PharmD, [facility] [phone]

## 2023-12-01 DIAGNOSIS — E785 Hyperlipidemia, unspecified: Secondary | ICD-10-CM | POA: Diagnosis not present

## 2023-12-01 DIAGNOSIS — N400 Enlarged prostate without lower urinary tract symptoms: Secondary | ICD-10-CM | POA: Diagnosis not present

## 2023-12-01 DIAGNOSIS — M1000 Idiopathic gout, unspecified site: Secondary | ICD-10-CM | POA: Diagnosis not present

## 2023-12-01 DIAGNOSIS — E1122 Type 2 diabetes mellitus with diabetic chronic kidney disease: Secondary | ICD-10-CM | POA: Diagnosis not present

## 2023-12-01 DIAGNOSIS — D631 Anemia in chronic kidney disease: Secondary | ICD-10-CM | POA: Diagnosis not present

## 2023-12-01 DIAGNOSIS — I10 Essential (primary) hypertension: Secondary | ICD-10-CM | POA: Diagnosis not present

## 2023-12-01 DIAGNOSIS — K219 Gastro-esophageal reflux disease without esophagitis: Secondary | ICD-10-CM | POA: Diagnosis not present

## 2023-12-25 ENCOUNTER — Ambulatory Visit: Payer: Medicare PPO | Admitting: Family Medicine

## 2023-12-28 ENCOUNTER — Other Ambulatory Visit: Payer: Self-pay | Admitting: Cardiology

## 2024-01-11 ENCOUNTER — Ambulatory Visit: Admitting: Family Medicine

## 2024-01-11 ENCOUNTER — Encounter: Payer: Self-pay | Admitting: Family Medicine

## 2024-01-11 VITALS — BP 127/69 | HR 65 | Ht 68.0 in | Wt 165.0 lb

## 2024-01-11 DIAGNOSIS — I152 Hypertension secondary to endocrine disorders: Secondary | ICD-10-CM | POA: Diagnosis not present

## 2024-01-11 DIAGNOSIS — N1831 Chronic kidney disease, stage 3a: Secondary | ICD-10-CM | POA: Diagnosis not present

## 2024-01-11 DIAGNOSIS — E785 Hyperlipidemia, unspecified: Secondary | ICD-10-CM | POA: Diagnosis not present

## 2024-01-11 DIAGNOSIS — E1169 Type 2 diabetes mellitus with other specified complication: Secondary | ICD-10-CM | POA: Diagnosis not present

## 2024-01-11 DIAGNOSIS — E1159 Type 2 diabetes mellitus with other circulatory complications: Secondary | ICD-10-CM | POA: Diagnosis not present

## 2024-01-11 NOTE — Patient Instructions (Signed)
  VISIT SUMMARY: Today, you came in for a follow-up visit to manage your diabetes, hypertension, and back pain. We discussed your recent improvements in blood sugar levels and your current medications. We also reviewed your blood pressure, which is well controlled, and your upcoming appointment with a back specialist.  YOUR PLAN: -DIABETES MELLITUS TYPE 2: Diabetes is a condition where your blood sugar levels are too high. Your recent A1c was 8.1%, which is above the target of less than 7%. Your blood sugar levels have improved recently, likely due to eating dinner earlier. Continue taking your current medications: glipizide  XL 10 mg daily, Jardiance  25 mg daily, and Rybelsus  14 mg daily. We will aim for fasting blood sugars less than or equal to 120 mg/dL. I have ordered an A1c test and urine tests to monitor your condition.  -HYPERTENSION: Hypertension is high blood pressure. Your blood pressure is well controlled at 127/69 mmHg. Continue taking your current medications: Lasix  20 mg daily and metoprolol  25 mg daily.  -CHRONIC KIDNEY DISEASE: Chronic kidney disease is a condition where your kidneys are damaged and can't filter blood as well as they should. This is being managed by your nephrologist. I have ordered a basic metabolic panel to check your kidney function and electrolytes.  -BACK PAIN: You have chronic back pain that was relieved by an injection two months ago. You have an appointment with a back specialist next month to further address this issue.  -GENERAL HEALTH MAINTENANCE: Routine health maintenance includes monitoring your cholesterol levels. I have ordered a lipid panel to check your cholesterol.  INSTRUCTIONS: Please follow up with the back specialist next month as planned. Additionally, get the A1c test, urine tests, and lipid panel done as ordered. Continue taking your medications as prescribed and aim for fasting blood sugars less than or equal to 120  mg/dL.                      Contains text generated by Abridge.                                 Contains text generated by Abridge.

## 2024-01-11 NOTE — Assessment & Plan Note (Signed)
 Chronic kidney disease is being managed by a nephrologist. - Order basic metabolic panel to assess kidney function and electrolytes

## 2024-01-11 NOTE — Assessment & Plan Note (Signed)
 Diabetes Mellitus Type 2 Chronic, improved  the target of less than 8% given advanced age. Blood glucose levels have improved recently with fasting levels under 200 mg/dL, attributed to dietary changes such as eating dinner earlier. Current medications include glipizide  XL 10 mg daily, Jardiance  25 mg daily, and Rybelsus  (semaglutide ) 14 mg daily. A diabetes foot exam was normal. - Order A1c test - Order urine albumin and creatinine - Continue glipizide  XL 10 mg daily - Continue Jardiance  25 mg daily - Continue Rybelsus  14 mg daily - Aim for fasting blood sugars less than or equal to 120 mg/dL

## 2024-01-11 NOTE — Progress Notes (Signed)
 Established patient visit   Patient: [name] Sanon Sr.   DOB: 1941-02-17   83 y.o. Male  MRN: [id number] Visit Date: 01/11/2024  Today's healthcare provider: Mimi Alt, MD   Chief Complaint  Patient presents with   Diabetes   Hypertension   Subjective       Discussed the use of AI scribe software for clinical note transcription with the patient, who gave verbal consent to proceed.  History of Present Illness [name] Hair Sr. is an 83 year old male with hypertension and diabetes who presents for a follow-up visit.  His last A1c was elevated at 8.1, with a goal of less than 7. Blood sugar levels have been under 200 every morning for the past couple of weeks, with a recent reading of 116. He attributes this improvement to eating supper earlier, around 6 or 6:30 PM, instead of 7 or 8 PM. He is currently on glipizide  XL 10 mg daily, Jardiance  25 mg daily, and Rybelsus  (semaglutide ) 14 mg daily. He uses Accu-Chek Aviva Plus for blood glucose monitoring.  For hypertension, he continues to take Lasix  20 mg and metoprolol  25 mg daily. His blood pressure is well controlled.  He experiences back pain and has an appointment with a back specialist next month. He received a shot two months ago that helped alleviate the pain, but sitting for extended periods exacerbates it. The pain limits his ability to be active, especially with the recent rainy weather.  He recently had most of his medications refilled and does not require any additional refills at this time. He has not had to pay for his medications recently, possibly due to meeting his deductible.  His son and daughter-in-law gifted him diabetic socks for Christmas, which he finds very comfortable. A cow stepped on his foot, but he was wearing steel-toe shoes, preventing more severe injury.     Past Medical History:  Diagnosis Date   Arthritis    Back pain    hx   CAD (coronary artery disease)     a. 2004 s/p CABG x 4 (LIMA->LAD, VG->Diag, VG->OM, VG->RCA); b. 2007 Cath: VG->Diag occluded, otw 3/4 patent grafts.   CKD (chronic kidney disease), stage II-III    Colon polyps    DM2 (diabetes mellitus, type 2) (HCC)    stable   GERD (gastroesophageal reflux disease)    Glaucoma    Gout    HLD (hyperlipidemia)    HTN (hypertension)    Neuropathy     Medications: Outpatient Medications Prior to Visit  Medication Sig   acetaminophen  (TYLENOL ) 650 MG CR tablet Take 650 mg by mouth every 8 (eight) hours as needed.   allopurinol  (ZYLOPRIM ) 100 MG tablet Take 1 tablet (100 mg total) by mouth daily.   aspirin  81 MG chewable tablet Chew 81 mg by mouth daily.   atorvastatin  (LIPITOR) 20 MG tablet Take 1 tablet (20 mg total) by mouth daily.   BD SHARPS CONTAINER HOME MISC Dispose of syringes after B12 injections   Blood Glucose Monitoring Suppl (ACCU-CHEK AVIVA PLUS) w/Device KIT To check blood sugar once daily   cetirizine (ZYRTEC) 10 MG tablet Take 10 mg by mouth daily. Take 1 tablet daily as needed for allergies   ferrous sulfate  325 (65 FE) MG tablet Take 325 mg by mouth daily with breakfast.   furosemide  (LASIX ) 20 MG tablet Take 1 tablet (20 mg total) by mouth every other day.   gabapentin  (NEURONTIN ) 300 MG capsule TAKE ONE  CAPSULE BY MOUTH TWICE DAILY   glipiZIDE  (GLUCOTROL  XL) 10 MG 24 hr tablet TAKE ONE TABLET BY MOUTH EVERY MORNING WITH BREAKFAST   glucose blood (ACCU-CHEK GUIDE TEST) test strip USE AS DIRECTED TO CHECK FASTING BLOOD SUGAR EVERY MORNING   JARDIANCE  25 MG TABS tablet TAKE ONE TABLET (25 MG TOTAL) BY MOUTH DAILY.   metoprolol  succinate (TOPROL -XL) 25 MG 24 hr tablet TAKE ONE TABLET (25 MG TOTAL) BY MOUTH IN THE MORNING AND AT BEDTIME.   nitroGLYCERIN  (NITROSTAT ) 0.4 MG SL tablet Place 1 tablet (0.4 mg total) under the tongue every 5 (five) minutes as needed for chest pain.   ranolazine  (RANEXA ) 1000 MG SR tablet TAKE ONE TABLET BY MOUTH TWO TIMES DAILY    Semaglutide  (RYBELSUS ) 14 MG TABS Take 1 tablet (14 mg total) by mouth daily.   tamsulosin  (FLOMAX ) 0.4 MG CAPS capsule Take 1 capsule (0.4 mg total) by mouth daily.   Facility-Administered Medications Prior to Visit  Medication Dose Route Frequency Provider   sodium chloride  flush (NS) 0.9 % injection 3 mL  3 mL Intravenous Q12H Furth, Cadence H, [name]    Review of Systems  Last metabolic panel Lab Results  Component Value Date   GLUCOSE 198 (H) 02/04/2023   NA 139 02/04/2023   K 5.4 (H) 02/04/2023   CL 103 02/04/2023   CO2 21 02/04/2023   BUN 26 02/04/2023   CREATININE 2.05 (H) 02/04/2023   EGFR 32 (L) 02/04/2023   CALCIUM  9.6 02/04/2023   PROT 5.9 (L) 06/16/2022   ALBUMIN 3.5 06/16/2022   LABGLOB 1.8 05/16/2021   AGRATIO 2.7 (H) 05/16/2021   BILITOT 0.6 06/16/2022   ALKPHOS 58 06/16/2022   AST 27 06/16/2022   ALT 52 (H) 06/16/2022   ANIONGAP 12 07/30/2022   Last lipids Lab Results  Component Value Date   CHOL 130 03/27/2023   HDL 40 03/27/2023   LDLCALC 61 03/27/2023   TRIG 169 (H) 03/27/2023   CHOLHDL 3.3 03/27/2023   Last hemoglobin A1c Lab Results  Component Value Date   HGBA1C 8.1 (H) 09/24/2023        Objective    BP 127/69   Pulse 65   Ht 5' 8 (1.727 m)   Wt 165 lb (74.8 kg)   SpO2 98%   BMI 25.09 kg/m  BP Readings from Last 3 Encounters:  01/11/24 127/69  09/24/23 124/60  06/01/23 138/70   Wt Readings from Last 3 Encounters:  01/11/24 165 lb (74.8 kg)  09/24/23 175 lb (79.4 kg)  06/01/23 163 lb 3.2 oz (74 kg)        Physical Exam Vitals reviewed.  Constitutional:      General: He is not in acute distress.    Appearance: Normal appearance. He is not ill-appearing.   Cardiovascular:     Rate and Rhythm: Normal rate and regular rhythm.     Pulses:          Dorsalis pedis pulses are 2+ on the right side and 2+ on the left side.       Posterior tibial pulses are 2+ on the right side and 2+ on the left side.  Pulmonary:      Effort: Pulmonary effort is normal. No respiratory distress.     Breath sounds: No wheezing, rhonchi or rales.   Musculoskeletal:     Right foot: Normal range of motion. No deformity or bunion.     Left foot: Normal range of motion. No deformity or bunion.  Feet:     Right foot:     Protective Sensation: 6 sites tested.  6 sites sensed.     Skin integrity: Skin integrity normal. No erythema.     Toenail Condition: Right toenails are normal.     Left foot:     Protective Sensation: 6 sites tested.  6 sites sensed.     Skin integrity: Skin integrity normal. No erythema.     Toenail Condition: Left toenails are normal.   Neurological:     Mental Status: He is alert and oriented to person, place, and time.   Psychiatric:        Mood and Affect: Mood normal.        Behavior: Behavior normal.       No results found for any visits on 01/11/24.  Assessment & Plan     Problem List Items Addressed This Visit       Cardiovascular and Mediastinum   Hypertension associated with diabetes (HCC)   Chronic Well controlled, at goal  Hypertension is well controlled with a current blood pressure of 127/69 mmHg. Current medications include Lasix  20 mg and metoprolol  25 mg daily. - Continue Lasix  20 mg daily - Continue metoprolol  25 mg daily - continue to follow up with nephrology and cardiology as scheduled       Relevant Orders   BMP8+EGFR   Urine Albumin/Creatinine with ratio (send out) [LAB689]     Endocrine   Type 2 diabetes mellitus with hyperlipidemia (HCC) - Primary   Diabetes Mellitus Type 2 Chronic, improved  the target of less than 8% given advanced age. Blood glucose levels have improved recently with fasting levels under 200 mg/dL, attributed to dietary changes such as eating dinner earlier. Current medications include glipizide  XL 10 mg daily, Jardiance  25 mg daily, and Rybelsus  (semaglutide ) 14 mg daily. A diabetes foot exam was normal. - Order A1c test - Order urine  albumin and creatinine - Continue glipizide  XL 10 mg daily - Continue Jardiance  25 mg daily - Continue Rybelsus  14 mg daily - Aim for fasting blood sugars less than or equal to 120 mg/dL      Relevant Orders   Hemoglobin A1c   Hyperlipidemia associated with type 2 diabetes mellitus (HCC)   Currently on Lipitor. Chronic  -Continue Lipitor 20mg  daily  - lipid panel ordered today        Relevant Orders   Lipid panel     Genitourinary   Stage 3a chronic kidney disease (HCC)   Chronic kidney disease is being managed by a nephrologist. - Order basic metabolic panel to assess kidney function and electrolytes        Assessment & Plan    Back Pain Chronic back pain with relief from an injection two months ago. He has an upcoming appointment with a back specialist next month.  General Health Maintenance Routine health maintenance includes monitoring cholesterol levels. - Order lipid panel     Return in about 4 months (around 05/12/2024) for CHRONIC F/U.         Mimi Alt, MD  [facility] [phone] (phone) [phone] (fax)  [facility]

## 2024-01-11 NOTE — Assessment & Plan Note (Signed)
 Chronic Well controlled, at goal  Hypertension is well controlled with a current blood pressure of 127/69 mmHg. Current medications include Lasix  20 mg and metoprolol  25 mg daily. - Continue Lasix  20 mg daily - Continue metoprolol  25 mg daily - continue to follow up with nephrology and cardiology as scheduled

## 2024-01-11 NOTE — Assessment & Plan Note (Signed)
 Currently on Lipitor. Chronic  -Continue Lipitor 20mg  daily  - lipid panel ordered today

## 2024-01-12 LAB — HEMOGLOBIN A1C
Est. average glucose Bld gHb Est-mCnc: 203 mg/dL
Hgb A1c MFr Bld: 8.7 % — ABNORMAL HIGH (ref 4.8–5.6)

## 2024-01-12 LAB — MICROALBUMIN / CREATININE URINE RATIO
Creatinine, Urine: 58 mg/dL
Microalb/Creat Ratio: <5 mg/g{creat} (ref 0–29)
Microalbumin, Urine: <3 ug/mL

## 2024-01-12 LAB — LIPID PANEL
Chol/HDL Ratio: 4.1 ratio (ref 0.0–5.0)
Cholesterol, Total: 140 mg/dL (ref 100–199)
HDL: 34 mg/dL — ABNORMAL LOW (ref >39)
LDL Chol Calc (NIH): 60 mg/dL (ref 0–99)
Triglycerides: 291 mg/dL — ABNORMAL HIGH (ref 0–149)
VLDL Cholesterol Cal: 46 mg/dL — ABNORMAL HIGH (ref 5–40)

## 2024-01-12 LAB — BMP8+EGFR
BUN/Creatinine Ratio: 13 (ref 10–24)
BUN: 21 mg/dL (ref 8–27)
CO2: 19 mmol/L — ABNORMAL LOW (ref 20–29)
Calcium: 9.2 mg/dL (ref 8.6–10.2)
Chloride: 103 mmol/L (ref 96–106)
Creatinine, Ser: 1.62 mg/dL — ABNORMAL HIGH (ref 0.76–1.27)
Glucose: 274 mg/dL — ABNORMAL HIGH (ref 70–99)
Potassium: 4.6 mmol/L (ref 3.5–5.2)
Sodium: 138 mmol/L (ref 134–144)
eGFR: 42 mL/min/{1.73_m2} — ABNORMAL LOW (ref >59)

## 2024-01-13 ENCOUNTER — Ambulatory Visit: Payer: Self-pay | Admitting: Family Medicine

## 2024-01-18 ENCOUNTER — Other Ambulatory Visit: Payer: Self-pay | Admitting: Family Medicine

## 2024-01-18 DIAGNOSIS — E118 Type 2 diabetes mellitus with unspecified complications: Secondary | ICD-10-CM

## 2024-02-25 ENCOUNTER — Other Ambulatory Visit: Payer: Self-pay

## 2024-02-25 ENCOUNTER — Other Ambulatory Visit: Payer: Self-pay | Admitting: Family Medicine

## 2024-02-25 DIAGNOSIS — E118 Type 2 diabetes mellitus with unspecified complications: Secondary | ICD-10-CM

## 2024-02-25 DIAGNOSIS — E785 Hyperlipidemia, unspecified: Secondary | ICD-10-CM

## 2024-02-25 MED ORDER — ATORVASTATIN CALCIUM 20 MG PO TABS: 20.0000 mg | ORAL_TABLET | Freq: Every day | ORAL | 3 refills | Status: AC

## 2024-03-01 ENCOUNTER — Other Ambulatory Visit: Payer: Self-pay | Admitting: Family Medicine

## 2024-03-01 DIAGNOSIS — M1A0790 Idiopathic chronic gout, unspecified ankle and foot, without tophus (tophi): Secondary | ICD-10-CM

## 2024-03-08 ENCOUNTER — Other Ambulatory Visit: Payer: Self-pay | Admitting: Medical

## 2024-03-10 ENCOUNTER — Other Ambulatory Visit: Payer: Self-pay | Admitting: Cardiovascular Disease

## 2024-03-30 ENCOUNTER — Ambulatory Visit: Payer: Self-pay

## 2024-03-30 NOTE — Telephone Encounter (Signed)
 FYI Only or Action Required?: Action required by provider: request for appointment. Patient only want's to see Dr. Lang.   Patient was last seen in primary care on 01/11/2024 by [name], MD.  Called Nurse Triage reporting Cough.  Symptoms began a week ago.  Symptoms are: gradually worsening.  Triage Disposition: Home Care  Patient/caregiver understands and will follow disposition?: No, wishes to speak with PCP         Copied from CRM #[id number]. Topic: Clinical - Red Word Triage >> Mar 30, 2024  9:52 AM Antwanette L wrote: Red Word that prompted transfer to Nurse Triage: Patient fell off the bed a week ago and hit his left.elbow. The patient has a bruised knot. Also the patient is experiencing a sore throat and coughing up a yellow phlegm.           Reason for Disposition  Cough with cold symptoms (e.g., runny nose, postnasal drip, throat clearing)  Answer Assessment - Initial Assessment Questions Patient would like to be seen in the office but only wants to see Dr. Lang. Patient would like to see if an appointment could be arranged or if they could be notified of any cancellations so he could be seen. Please advise.           1. ONSET: When did the cough begin?      A week ago 2. SEVERITY: How bad is the cough today?      Mild to moderate  3. SPUTUM: Describe the color of your sputum (e.g., none, dry cough; clear, white, yellow, green)     Yellow 4. HEMOPTYSIS: Are you coughing up any blood? If Yes, ask: How much? (e.g., flecks, streaks, tablespoons, etc.)     No 5. DIFFICULTY BREATHING: Are you having difficulty breathing? If Yes, ask: How bad is it? (e.g., mild, moderate, severe)      No 6. FEVER: Do you have a fever? If Yes, ask: What is your temperature, how was it measured, and when did it start?     No 7. CARDIAC HISTORY: Do you have any history of heart disease? (e.g., heart attack, congestive heart failure)       Yes 8. LUNG HISTORY: Do you have any history of lung disease?  (e.g., pulmonary embolus, asthma, emphysema)     No 9. PE RISK FACTORS: Do you have a history of blood clots? (or: recent major surgery, recent prolonged travel, bedridden)     No 10. OTHER SYMPTOMS: Do you have any other symptoms? (e.g., runny nose, wheezing, chest pain)       Sore throat, knot on left elbow from a fall last week  Protocols used: Cough - Acute Productive-A-AH

## 2024-03-30 NOTE — Telephone Encounter (Signed)
 LVM to call office back, provider has openings on Thursday was going to offer him an appointment with her regarding his concerns will try to call again later.

## 2024-03-30 NOTE — Telephone Encounter (Signed)
 Patient called back and scheduled an appointment tomorrow 03/31/2024 @ 10:20 am with provider.

## 2024-03-31 ENCOUNTER — Ambulatory Visit
Admission: RE | Admit: 2024-03-31 | Discharge: 2024-03-31 | Disposition: A | Discharge status: Home / Self Care | Attending: Family Medicine | Admitting: Family Medicine

## 2024-03-31 ENCOUNTER — Encounter: Payer: Self-pay | Admitting: Family Medicine

## 2024-03-31 ENCOUNTER — Ambulatory Visit: Admitting: Family Medicine

## 2024-03-31 ENCOUNTER — Ambulatory Visit
Admission: RE | Admit: 2024-03-31 | Discharge: 2024-03-31 | Disposition: A | Discharge status: Home / Self Care | Source: Ambulatory Visit | Attending: Family Medicine | Admitting: Family Medicine

## 2024-03-31 VITALS — BP 147/79 | HR 66 | Temp 98.6°F | Resp 16 | Wt 166.0 lb

## 2024-03-31 DIAGNOSIS — W19XXXA Unspecified fall, initial encounter: Secondary | ICD-10-CM | POA: Insufficient documentation

## 2024-03-31 DIAGNOSIS — M799 Soft tissue disorder, unspecified: Secondary | ICD-10-CM | POA: Diagnosis not present

## 2024-03-31 DIAGNOSIS — M25522 Pain in left elbow: Secondary | ICD-10-CM | POA: Diagnosis not present

## 2024-03-31 DIAGNOSIS — M7022 Olecranon bursitis, left elbow: Secondary | ICD-10-CM | POA: Diagnosis not present

## 2024-03-31 DIAGNOSIS — Z23 Encounter for immunization: Secondary | ICD-10-CM | POA: Diagnosis not present

## 2024-03-31 DIAGNOSIS — S59902A Unspecified injury of left elbow, initial encounter: Secondary | ICD-10-CM | POA: Diagnosis not present

## 2024-03-31 DIAGNOSIS — S5002XA Contusion of left elbow, initial encounter: Secondary | ICD-10-CM | POA: Diagnosis not present

## 2024-03-31 DIAGNOSIS — M7989 Other specified soft tissue disorders: Secondary | ICD-10-CM | POA: Diagnosis not present

## 2024-03-31 DIAGNOSIS — M79622 Pain in left upper arm: Secondary | ICD-10-CM | POA: Diagnosis not present

## 2024-03-31 NOTE — Progress Notes (Signed)
 Established patient visit   Patient: [name] Whetsel Sr.   DOB: Dec 14, 1940   83 y.o. Male  MRN: [id number] Visit Date: 03/31/2024  Today's healthcare provider: Rockie Agent, MD   Chief Complaint  Patient presents with   Fall    Patient fell off the bed a week ago and hit his left elbow. He has a bruised/knot maybe some fluid.   Subjective     HPI     Fall    Additional comments: Patient fell off the bed a week ago and hit his left elbow. He has a bruised/knot maybe some fluid.      Last edited by Rosas, Joseline E, CMA on 03/31/2024 10:10 AM.       Discussed the use of AI scribe software for clinical note transcription with the patient, who gave verbal consent to proceed.  History of Present Illness [name] Zinni Sr. is an 83 year old male who presents with left elbow pain and swelling after a fall.  Approximately a week ago, he fell out of bed, landing on his left elbow, which has since been painful and swollen. He describes the sensation as similar to 'bursitis' and notes fluid accumulation in the area. He also sustained a scratch on the elbow but denies any significant head injury despite hitting the back of his head against the wall. No loss of consciousness, lacerations, or bleeding from the scalp were noted.  Despite the discomfort, he continues to drive a tractor daily.  Regarding his diabetes management, his blood sugar levels have been consistently under 130 for the past two months, a significant improvement from previous levels that reached up to 200. He attributes this improvement to dietary changes, including eating dinner earlier.     Past Medical History:  Diagnosis Date   Arthritis    Back pain    hx   CAD (coronary artery disease)    a. 2004 s/p CABG x 4 (LIMA->LAD, VG->Diag, VG->OM, VG->RCA); b. 2007 Cath: VG->Diag occluded, otw 3/4 patent grafts.   CKD (chronic kidney disease), stage II-III    Colon polyps    DM2  (diabetes mellitus, type 2) (HCC)    stable   GERD (gastroesophageal reflux disease)    Glaucoma    Gout    HLD (hyperlipidemia)    HTN (hypertension)    Neuropathy     Medications: Outpatient Medications Prior to Visit  Medication Sig   acetaminophen  (TYLENOL ) 650 MG CR tablet Take 650 mg by mouth every 8 (eight) hours as needed.   allopurinol  (ZYLOPRIM ) 100 MG tablet TAKE ONE TABLET (100 MG TOTAL) BY MOUTH DAILY.   aspirin  81 MG chewable tablet Chew 81 mg by mouth daily.   atorvastatin  (LIPITOR) 20 MG tablet Take 1 tablet (20 mg total) by mouth daily.   Blood Glucose Monitoring Suppl (ACCU-CHEK AVIVA PLUS) w/Device KIT To check blood sugar once daily   cetirizine (ZYRTEC) 10 MG tablet Take 10 mg by mouth daily. Take 1 tablet daily as needed for allergies   empagliflozin  (JARDIANCE ) 25 MG TABS tablet TAKE ONE TABLET (25 MG TOTAL) BY MOUTH DAILY.   ferrous sulfate  325 (65 FE) MG tablet Take 325 mg by mouth daily with breakfast.   furosemide  (LASIX ) 20 MG tablet TAKE ONE TABLET BY MOUTH ONCE A DAY   gabapentin  (NEURONTIN ) 300 MG capsule TAKE ONE CAPSULE BY MOUTH TWICE DAILY   glipiZIDE  (GLUCOTROL  XL) 10 MG 24 hr tablet TAKE ONE TABLET BY MOUTH EVERY  MORNING WITH BREAKFAST   glucose blood (ACCU-CHEK GUIDE TEST) test strip USE AS DIRECTED TO CHECK FASTING BLOOD SUGAR EVERY MORNING   metoprolol  succinate (TOPROL -XL) 25 MG 24 hr tablet TAKE ONE TABLET (25 MG TOTAL) BY MOUTH IN THE MORNING AND AT BEDTIME.   nitroGLYCERIN  (NITROSTAT ) 0.4 MG SL tablet Place 1 tablet (0.4 mg total) under the tongue every 5 (five) minutes as needed for chest pain.   ranolazine  (RANEXA ) 1000 MG SR tablet TAKE ONE TABLET BY MOUTH TWO TIMES DAILY   Semaglutide  (RYBELSUS ) 14 MG TABS Take 1 tablet (14 mg total) by mouth daily.   tamsulosin  (FLOMAX ) 0.4 MG CAPS capsule Take 1 capsule (0.4 mg total) by mouth daily.   BD SHARPS CONTAINER HOME MISC Dispose of syringes after B12 injections   Facility-Administered  Medications Prior to Visit  Medication Dose Route Frequency Provider   sodium chloride  flush (NS) 0.9 % injection 3 mL  3 mL Intravenous Q12H Furth, Cadence H, [name]    Review of Systems  Last metabolic panel Lab Results  Component Value Date   GLUCOSE 274 (H) 01/11/2024   NA 138 01/11/2024   K 4.6 01/11/2024   CL 103 01/11/2024   CO2 19 (L) 01/11/2024   BUN 21 01/11/2024   CREATININE 1.62 (H) 01/11/2024   EGFR 42 (L) 01/11/2024   CALCIUM  9.2 01/11/2024   PROT 5.9 (L) 06/16/2022   ALBUMIN 3.5 06/16/2022   LABGLOB 1.8 05/16/2021   AGRATIO 2.7 (H) 05/16/2021   BILITOT 0.6 06/16/2022   ALKPHOS 58 06/16/2022   AST 27 06/16/2022   ALT 52 (H) 06/16/2022   ANIONGAP 12 07/30/2022   Last lipids Lab Results  Component Value Date   CHOL 140 01/11/2024   HDL 34 (L) 01/11/2024   LDLCALC 60 01/11/2024   TRIG 291 (H) 01/11/2024   CHOLHDL 4.1 01/11/2024   Last hemoglobin A1c Lab Results  Component Value Date   HGBA1C 8.7 (H) 01/11/2024        Objective    BP (!) 147/79 (BP Location: Right Arm, Patient Position: Sitting, Cuff Size: Normal)   Pulse 66   Temp 98.6 F (37 C) (Oral)   Resp 16   Wt 166 lb (75.3 kg)   SpO2 99%   BMI 25.24 kg/m      Physical Exam  Physical Exam VITALS: BP- 147/79 EXTREMITIES: Left medial forearm and upper extremity ecchymosis on medial aspect. Tenderness over left lateral epicondyle, no tenderness over medial epicondyle. Olecranon nontender to palpation. Significant bursitis and swelling superior to left elbow. 5/5 strength in left elbow flexion and extension. NEUROLOGICAL: No loss of consciousness with head injury. No lacerations, deformities, bruising, ecchymosis, swelling, nodules on scalp.    No results found for any visits on 03/31/24.  Assessment & Plan     Problem List Items Addressed This Visit   None Visit Diagnoses       Fall, initial encounter    -  Primary   Relevant Orders   DG Elbow Complete Left   DG Humerus Left      Injury of left elbow, initial encounter       Relevant Orders   DG Elbow Complete Left   DG Humerus Left     Olecranon bursitis of left elbow         Immunization due       Relevant Orders   Flu vaccine HIGH DOSE PF(Fluzone Trivalent) (Completed)       Assessment and Plan Assessment & Plan  Left elbow bursitis after fall Left elbow bursitis with swelling and tenderness following a fall from bed approximately 7 days ago. No signs of infection or significant heat. Differential diagnosis includes trauma-induced bursitis versus gout, though gout is less likely given the traumatic event. - Performed attempted aspiration of left elbow bursa to relieve swelling and send fluid for testing to rule out gout or infection. Unable to aspirate any fluid   Left elbow joint aspiration Discussed procedure, risks and benefits of bursa aspiration   After cleaning the left olecranon swelling with alcohol, the patient's LUE was held in flexed position, cold spray was applied for anesthetic effect and using a 22 gauge 1.5in needle aspiration was attempted, no fluid was aspirated, needle was removed and patient had minimal blood loss, tolerated attempted procedure well.  No fluid sent for culture, order was cancelled   - Apply cold spray to numb the area before aspiration. - Advise on potential risks of aspiration, including infection and pain, and obtain informed consent. - Instruct to keep compression on the elbow to help reduce swelling.  Left elbow injury after fall Left elbow injury with significant bruising and tenderness over the lateral epicondyle. No loss of consciousness or head injury during the fall. No signs of fracture on initial examination, but x-ray is needed to confirm. - Order x-ray of left humerus and left elbow to evaluate for fractures. - Advise on the location of the imaging center for x-ray completion. - Follow up with results of x-ray to determine further  management.     Return in 2 months (on 05/31/2024), or if symptoms worsen or fail to improve, for Chronic F/U, HTN, DM.     Influenza vaccine was administered today     Rockie Agent, MD  [facility] [phone] (phone) [phone] (fax)  [facility]

## 2024-03-31 NOTE — Patient Instructions (Signed)
 Please report to [facility] located at:  [address]  [place], [place] [id number]  You do not need an appointment to have xrays completed.   Our office will follow up with  results once available.

## 2024-04-01 ENCOUNTER — Ambulatory Visit: Payer: Self-pay | Admitting: Family Medicine

## 2024-04-04 ENCOUNTER — Other Ambulatory Visit: Payer: Self-pay | Admitting: Family Medicine

## 2024-04-04 DIAGNOSIS — N401 Enlarged prostate with lower urinary tract symptoms: Secondary | ICD-10-CM

## 2024-04-13 NOTE — Progress Notes (Unsigned)
 Cardiology Office Note    Date:  04/14/2024   ID:  [name], DOB [birth year], MRN [id number]  PCP:  [name], MD  Cardiologist:  [name], MD  Electrophysiologist:  [name], MD   Chief Complaint: Follow up  History of Present Illness:   [name] Thorington [place]ibo Sr. is a 83 y.o. 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    Patient has a history of CABG x 4 in 2004 with subsequent catheterization in 2007 revealing 3 out of 4 patent grafts (occluded vein graft to the diagonal).  Myoview  07/2019 was abnormal and high risk with possible ischemia, LVEF 43%.  Echo 07/2019 showed EF 50 to 55%, G2 DD, WMA, mildly dilated LA, and moderately elevated pulmonary artery pressure.  Patient was without symptoms of angina at that time and no further ischemic evaluation was pursued.  Seen 08/2021 reporting exertional chest pain.  Lexiscan  Myoview  showed ischemia in the inferior wall with perfusion defect, moderate reversible in the inferior wall and apical region with EF 43%, moderate risk scan.  Cardiac catheterization 08/2021 revealed multivessel CAD including severe diffuse LAD and left circumflex disease.  Short chronic total occlusion of the mid RCA with bridging and left-to-right collaterals was noted.  Widely patent LIMA to LAD and sequential SVG to OM and OM 2.  Chronically occluded SVG to D1.  Normal LVEF with normal filling pressures.  Unsuccessful attempted PCI to the mid RCA due to inability to cross the lesion using run-through and Fielder XT wires.  Recommendation for escalation of antianginal therapy with Imdur  increased to 120 mg daily.  Saw Dr. Jackolyn 02/2022 and CTO PCI was felt to be high risk with activity modification recommended.  He was admitted 05/2022 with heart failure exacerbation.  Echo showed EF 45 to  50%, RV mildly reduced.  He was diuresed 8 L with IV Lasix .  He was transition to oral Lasix .  GDMT limited by CKD.  Discharge weight was 166 pounds.  Admitted again 06/2022 with hyperkalemia, K greater than 7.5.  He had been taking potassium 20 mill equivalents twice daily rather than his prescribed daily dose.  He was also treated for AKI during this stay with creatinine up to 2.2.  Seen in follow-up 08/2022 reporting dizziness with positive orthostatics.  Coreg  was discontinued and Lasix  dose was reduced.  2-week heart monitor was ordered and revealed 164 episodes of SVT the longest of which lasting 5 minutes and 44 seconds.  Also noted to have 2.5% burden of PACs and 11% burden of PVCs.  Patient was referred to EP and seen by Dr. HOLTS 11/2022.  He was started on metoprolol  succinate 25 mg twice daily.  He was not felt to be a candidate for EP study or ablation given likely etiology of arrhythmia being atrial tachycardia.   Patient was most recently seen in our clinic 02/2023 overall doing well from a cardiac perspective.  No further testing or medication changes were indicated at that time.  Patient was seen by EP 04/2023 and reported orthostatic symptoms for which Lasix  was reduced to 20 mg every other day.  Patient presents to clinic today overall doing well from a cardiac perspective without symptoms of angina and cardiac decompensation.  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  He denies any exertional symptoms of chest pain or shortness of breath. He is noted to be in atrial fibrillation on EKG and rhythm strip. He denies any symptoms including fatigue, lightheadedness, dizziness, palpitations, and lower extremity swelling.  Labs independently reviewed: 12/2023-BUN 21, creatinine 1.62, sodium 138, potassium 4.6, A1c 8.7, TC 140, TG 291, HDL 34, LDL 60 11/2023-Hgb 83.2, HCT 49.6, platelets 183  Objective    Past Medical History:  Diagnosis Date   Arthritis    Back pain    hx   CAD (coronary artery disease)    a. 2004 s/p CABG x 4 (LIMA->LAD, VG->Diag, VG->OM, VG->RCA); b. 2007 Cath: VG->Diag occluded, otw 3/4 patent grafts.   CKD (chronic kidney disease), stage II-III    Colon polyps    DM2 (diabetes mellitus, type 2) (HCC)    stable   GERD (gastroesophageal reflux disease)    Glaucoma    Gout    HLD (hyperlipidemia)    HTN (hypertension)    Neuropathy     Current Medications: Current Meds  Medication Sig   acetaminophen  (TYLENOL ) 650 MG CR tablet Take 650 mg by mouth every 8 (eight) hours as needed.   allopurinol  (ZYLOPRIM ) 100 MG tablet TAKE ONE TABLET (100 MG TOTAL) BY MOUTH DAILY.   apixaban  (ELIQUIS ) 5 MG TABS tablet Take 1 tablet (5 mg total) by mouth 2 (two) times daily.   atorvastatin  (LIPITOR) 20 MG tablet Take 1 tablet (20 mg total) by mouth daily.   BD SHARPS CONTAINER HOME MISC Dispose of syringes after B12 injections   Blood Glucose Monitoring Suppl (ACCU-CHEK AVIVA PLUS) w/Device KIT To check blood sugar once daily   cetirizine (ZYRTEC) 10 MG tablet Take 10 mg by mouth daily. Take 1 tablet daily as needed for allergies   empagliflozin  (JARDIANCE ) 25 MG TABS tablet TAKE ONE TABLET (25 MG TOTAL) BY MOUTH DAILY.   ferrous sulfate  325 (65 FE) MG tablet Take 325 mg by mouth daily with breakfast.   furosemide  (LASIX ) 20 MG tablet TAKE ONE TABLET BY MOUTH ONCE A DAY   gabapentin  (NEURONTIN ) 300 MG capsule TAKE ONE CAPSULE BY MOUTH TWICE DAILY   glipiZIDE  (GLUCOTROL  XL) 10 MG 24 hr tablet TAKE ONE TABLET BY MOUTH EVERY MORNING WITH BREAKFAST   glucose blood (ACCU-CHEK GUIDE TEST) test strip USE AS DIRECTED TO CHECK FASTING BLOOD SUGAR EVERY MORNING   metoprolol  succinate (TOPROL -XL) 25 MG 24 hr tablet TAKE ONE TABLET (25 MG TOTAL) BY MOUTH IN THE MORNING AND AT BEDTIME.   nitroGLYCERIN  (NITROSTAT ) 0.4 MG SL tablet Place 1 tablet (0.4 mg total) under the tongue every 5  (five) minutes as needed for chest pain.   ranolazine  (RANEXA ) 1000 MG SR tablet TAKE ONE TABLET BY MOUTH TWO TIMES DAILY   Semaglutide  (RYBELSUS ) 14 MG TABS Take 1 tablet (14 mg total) by mouth daily.   tamsulosin  (FLOMAX ) 0.4 MG CAPS capsule TAKE ONE CAPSULE (0.4 MG TOTAL) BY MOUTH DAILY.   [DISCONTINUED] aspirin  81 MG chewable tablet Chew 81 mg by mouth daily.   Current Facility-Administered Medications for the 04/14/24 encounter (Office Visit) with Lorene Lesley CROME, [name]  Medication   sodium chloride  flush (NS) 0.9 % injection 3 mL    Allergies:   Ramipril   Social History   Socioeconomic History   Marital status: Widowed    Spouse name: Not on file   Number of children: 3   Years of education: Not on file  Highest education level: 8th grade  Occupational History   Occupation: Retired  Tobacco Use   Smoking status: Never   Smokeless tobacco: Current    Types: Chew   Tobacco comments:    chew tobacco - 1 pack last 3 weeks  Vaping Use   Vaping status: Never Used  Substance and Sexual Activity   Alcohol use: No    Alcohol/week: 0.0 standard drinks of alcohol   Drug use: No   Sexual activity: Not Currently  Other Topics Concern   Not on file  Social History Narrative   Not on file   Social Drivers of Health   Financial Resource Strain: Low Risk  (09/24/2023)   Overall Financial Resource Strain (CARDIA)    Difficulty of Paying Living Expenses: Not hard at all  Food Insecurity: No Food Insecurity (09/24/2023)   Hunger Vital Sign    Worried About Running Out of Food in the Last Year: Never true    Ran Out of Food in the Last Year: Never true  Transportation Needs: No Transportation Needs (09/24/2023)   PRAPARE - Administrator, Civil Service (Medical): No    Lack of Transportation (Non-Medical): No  Physical Activity: Inactive (12/01/2022)   Exercise Vital Sign    Days of Exercise per Week: 0 days    Minutes of Exercise per Session: 0 min  Stress: No Stress  Concern Present (09/24/2023)   [employer] of Occupational Health - Occupational Stress Questionnaire    Feeling of Stress : Not at all  Social Connections: Unknown (12/01/2022)   Social Connection and Isolation Panel    Frequency of Communication with Friends and Family: More than three times a week    Frequency of Social Gatherings with Friends and Family: More than three times a week    Attends Religious Services: Not on file    Active Member of Clubs or Organizations: No    Attends Banker Meetings: Never    Marital Status: Widowed     Family History:  The patient's family history includes Stroke in his mother.  ROS:   12-point review of systems is negative unless otherwise noted in the HPI.  EKGs/Other Studies Reviewed:    Studies reviewed were summarized above. The additional studies were reviewed today:  09/2022 Long term monitor Normal sinus rhythm Patient had a min HR of 47 bpm, max HR of 150 bpm, and avg HR of 72 bpm.  1 run of Ventricular Tachycardia occurred lasting 4 beats with a max rate of 150 bpm (avg 142 bpm).  164 Supraventricular Tachycardia runs occurred, the run with the fastest interval lasting 4 beats with a max rate of 138 bpm, the longest lasting 5 mins 44 secs with an avg rate of 106 bpm.  Isolated SVEs were occasional (2.5%, 63387), SVE Couplets were rare (<1.0%, 4695), and SVE Triplets were rare (<1.0%, 1259).  Isolated VEs were frequent (11.1%, [id number]), VE Couplets were rare (<1.0%, 3136), and VE Triplets were rare (<1.0%, 2).  Ventricular Bigeminy and Trigeminy were present. Patient triggered events (4) with normal sinus rhythm, PACs, PVCs  05/2022 Echo complete 1. Left ventricular ejection fraction, by estimation, is 45 to 50%. The left ventricle has mildly decreased function. The left ventricle demonstrates global hypokinesis. The left ventricular internal cavity size was mildly dilated. Left ventricular diastolic parameters are  consistent with Grade I diastolic dysfunction (impaired relaxation).   2. Right ventricular systolic function is mildly reduced. The right ventricular size is normal. There  is normal pulmonary artery systolic pressure.   3. Left atrial size was mildly dilated.   4. The mitral valve is grossly normal. No evidence of mitral valve regurgitation. No evidence of mitral stenosis.   5. The aortic valve is tricuspid. There is moderate calcification of the aortic valve. Aortic valve regurgitation is not visualized. Aortic valve sclerosis/calcification is present, without any evidence of aortic stenosis.   6. The inferior vena cava is dilated in size with >50% respiratory variability, suggesting right atrial pressure of 8 mmHg.   EKG:  EKG personally reviewed by me today EKG Interpretation Date/Time:  Thursday April 14 2024 07:57:33 EDT Ventricular Rate:  71 PR Interval:    QRS Duration:  154 QT Interval:  456 QTC Calculation: 495 R Axis:   101  Text Interpretation: Atrial fibrillation with premature ventricular or aberrantly conducted complexes Right bundle branch block When compared with ECG of 20-May-2023 09:37, Atrial fibrillation has replaced Sinus rhythm T wave inversion now evident in Anterior leads Confirmed by Lorene Sinclair ([id number]) on 04/14/2024 8:42:24 AM  PHYSICAL EXAM:    VS:  BP (!) 158/80 (BP Location: Left Arm, Patient Position: Sitting, Cuff Size: Normal)   Pulse 71   Ht 5' 3 (1.6 m)   Wt 164 lb 2 oz (74.4 kg)   SpO2 98%   BMI 29.07 kg/m   BMI: Body mass index is 29.07 kg/m.  GEN: Well nourished, well developed in no acute distress NECK: No JVD; No carotid bruits CARDIAC: IRIR, no murmurs, rubs, gallops RESPIRATORY:  Clear to auscultation without rales, wheezing or rhonchi  ABDOMEN: Soft, non-tender, non-distended EXTREMITIES: No edema; No deformity  Wt Readings from Last 3 Encounters:  04/14/24 164 lb 2 oz (74.4 kg)  03/31/24 166 lb (75.3 kg)  01/11/24 165 lb (74.8  kg)       ASSESSMENT & PLAN:   New onset atrial fibrillation - EKG and rhythm strip today show atrial fibrillation.  Patient is asymptomatic to this.  He is rate controlled on current dose of metoprolol  succinate 25 mg daily.  Check CBC, BMP, TSH, and magnesium today.  Update echo.  2 week ZIO XT to assess burden.  Will discontinue aspirin  and replace with Eliquis  5 mg twice daily for CHA2DS2-VASc 6.  If patient remains in atrial fibrillation at follow-up, anticipate proceeding with cardioversion to restore sinus rhythm.  CAD - Cardiac catheterization 08/2021 with severe three-vessel CAD and unsuccessful PCI attempt to the RCA.  Seen by CTO clinic in [facility] with medical management recommended.  He is without symptoms of exertional angina.  No further testing indicated at this time.  Will replace aspirin  with Eliquis  as above.  He is continued on atorvastatin  20 mg daily, metoprolol  succinate 25 mg daily, and Ranexa  1000 mg twice daily.  HFmrEF - Most recent echo 05/2022 with EF 45 to 50%.  Did not tolerate carvedilol  in the past.  He is continued on Jardiance  25 mg daily, Lasix  20 mg every other day, and metoprolol  succinate 25 mg daily.  Consider addition of ARB at follow-up.  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  Followed by EP.  Continued on metoprolol  succinate 25 mg daily.  Hypertension - Blood pressure moderately elevated today.  Will defer additional medication at this time given prior orthostasis.  Continue to monitor.  Hyperlipidemia - Most recent lipid panel 12/2023 with LDL 60.  He is continued on atorvastatin  20 mg daily.    Disposition: F/u with Dr. Gollan or an APP  in 1 month.   Medication Adjustments/Labs and Tests Ordered: Current medicines are reviewed at length with the patient today.  Concerns regarding medicines are outlined above. Medication changes, Labs and Tests ordered today are summarized above and listed in the Patient Instructions accessible in  Encounters.   Bonney Lesley Maffucci, [name] 04/14/2024 8:42 AM     [facility] [place] [address] [place], [place] [address] [phone]

## 2024-04-14 ENCOUNTER — Ambulatory Visit: Attending: Nurse Practitioner | Admitting: Physician Assistant

## 2024-04-14 ENCOUNTER — Ambulatory Visit (INDEPENDENT_AMBULATORY_CARE_PROVIDER_SITE_OTHER)

## 2024-04-14 ENCOUNTER — Encounter: Payer: Self-pay | Admitting: Nurse Practitioner

## 2024-04-14 VITALS — BP 158/80 | HR 71 | Ht 63.0 in | Wt 164.1 lb

## 2024-04-14 DIAGNOSIS — I471 Supraventricular tachycardia, unspecified: Secondary | ICD-10-CM | POA: Diagnosis not present

## 2024-04-14 DIAGNOSIS — I5022 Chronic systolic (congestive) heart failure: Secondary | ICD-10-CM

## 2024-04-14 DIAGNOSIS — I10 Essential (primary) hypertension: Secondary | ICD-10-CM

## 2024-04-14 DIAGNOSIS — I25118 Atherosclerotic heart disease of native coronary artery with other forms of angina pectoris: Secondary | ICD-10-CM

## 2024-04-14 DIAGNOSIS — I4891 Unspecified atrial fibrillation: Secondary | ICD-10-CM

## 2024-04-14 MED ORDER — APIXABAN 5 MG PO TABS: 5.0000 mg | ORAL_TABLET | Freq: Two times a day (BID) | ORAL | 11 refills | Status: AC

## 2024-04-14 NOTE — Patient Instructions (Signed)
 Medication Instructions:  STOP the Aspirin   START Eliquis  5 mg twice daily  *If you need a refill on your cardiac medications before your next appointment, please call your pharmacy*  Lab Work: Your provider would like for you to have the following labs today: CBC, BMET, TSH and Magnesium  If you have labs (blood work) drawn today and your tests are completely normal, you will receive your results only by: MyChart Message (if you have MyChart) OR A paper copy in the mail If you have any lab test that is abnormal or we need to change your treatment, we will call you to review the results.  Testing/Procedures: Your physician has requested that you have an echocardiogram. Echocardiography is a painless test that uses sound waves to create images of your heart. It provides your doctor with information about the size and shape of your heart and how well your heart's chambers and valves are working.   You may receive an ultrasound enhancing agent through an IV if needed to better visualize your heart during the echo. This procedure takes approximately one hour.  There are no restrictions for this procedure.  This will take place at [address] [facility]) [address], [place] [address]  Please note: We ask at that you not bring children with you during ultrasound (echo/ vascular) testing. Due to room size and safety concerns, children are not allowed in the ultrasound rooms during exams. Our front office staff cannot provide observation of children in our lobby area while testing is being conducted. An adult accompanying a patient to their appointment will only be allowed in the ultrasound room at the discretion of the ultrasound technician under special circumstances. We apologize for any inconvenience.   Follow-Up: At [facility], you and your health needs are our priority.  As part of our continuing mission to provide you with exceptional heart care, our providers are  all part of one team.  This team includes your primary Cardiologist (physician) and Advanced Practice Providers or APPs (Physician Assistants and Nurse Practitioners) who all work together to provide you with the care you need, when you need it.  Your next appointment:   1 month(s)  Provider:   You may see Timothy Gollan, MD or one of the following Advanced Practice Providers on your designated Care Team:   Lesley Maffucci, [name]  We recommend signing up for the patient portal called MyChart.  Sign up information is provided on this After Visit Summary.  MyChart is used to connect with patients for Virtual Visits (Telemedicine).  Patients are able to view lab/test results, encounter notes, upcoming appointments, etc.  Non-urgent messages can be sent to your provider as well.   To learn more about what you can do with MyChart, go to [email or web address].

## 2024-04-15 LAB — BASIC METABOLIC PANEL WITH GFR
BUN/Creatinine Ratio: 9 — ABNORMAL LOW (ref 10–24)
BUN: 13 mg/dL (ref 8–27)
CO2: 20 mmol/L (ref 20–29)
Calcium: 8.9 mg/dL (ref 8.6–10.2)
Chloride: 105 mmol/L (ref 96–106)
Creatinine, Ser: 1.37 mg/dL — ABNORMAL HIGH (ref 0.76–1.27)
Glucose: 108 mg/dL — ABNORMAL HIGH (ref 70–99)
Potassium: 4.2 mmol/L (ref 3.5–5.2)
Sodium: 140 mmol/L (ref 134–144)
eGFR: 52 mL/min/1.73 — ABNORMAL LOW (ref >59)

## 2024-04-15 LAB — CBC
Hematocrit: 42.9 % (ref 37.5–51.0)
Hemoglobin: 14.2 g/dL (ref 13.0–17.7)
MCH: 32.3 pg (ref 26.6–33.0)
MCHC: 33.1 g/dL (ref 31.5–35.7)
MCV: 98 fL — ABNORMAL HIGH (ref 79–97)
Platelets: 208 x10E3/uL (ref 150–450)
RBC: 4.39 x10E6/uL (ref 4.14–5.80)
RDW: 11.8 % (ref 11.6–15.4)
WBC: 6.4 x10E3/uL (ref 3.4–10.8)

## 2024-04-15 LAB — TSH: TSH: 2.47 u[IU]/mL (ref 0.450–4.500)

## 2024-04-15 LAB — MAGNESIUM: Magnesium: 2.2 mg/dL (ref 1.6–2.3)

## 2024-04-18 ENCOUNTER — Ambulatory Visit: Payer: Self-pay | Admitting: Physician Assistant

## 2024-04-25 ENCOUNTER — Other Ambulatory Visit: Payer: Self-pay | Admitting: Family Medicine

## 2024-04-25 DIAGNOSIS — G629 Polyneuropathy, unspecified: Secondary | ICD-10-CM

## 2024-05-03 DIAGNOSIS — N1832 Chronic kidney disease, stage 3b: Secondary | ICD-10-CM | POA: Diagnosis not present

## 2024-05-03 DIAGNOSIS — R8290 Unspecified abnormal findings in urine: Secondary | ICD-10-CM | POA: Diagnosis not present

## 2024-05-03 DIAGNOSIS — E1122 Type 2 diabetes mellitus with diabetic chronic kidney disease: Secondary | ICD-10-CM | POA: Diagnosis not present

## 2024-05-03 DIAGNOSIS — K219 Gastro-esophageal reflux disease without esophagitis: Secondary | ICD-10-CM | POA: Diagnosis not present

## 2024-05-03 DIAGNOSIS — M1000 Idiopathic gout, unspecified site: Secondary | ICD-10-CM | POA: Diagnosis not present

## 2024-05-03 DIAGNOSIS — E785 Hyperlipidemia, unspecified: Secondary | ICD-10-CM | POA: Diagnosis not present

## 2024-05-03 DIAGNOSIS — I10 Essential (primary) hypertension: Secondary | ICD-10-CM | POA: Diagnosis not present

## 2024-05-03 DIAGNOSIS — N400 Enlarged prostate without lower urinary tract symptoms: Secondary | ICD-10-CM | POA: Diagnosis not present

## 2024-05-03 DIAGNOSIS — D631 Anemia in chronic kidney disease: Secondary | ICD-10-CM | POA: Diagnosis not present

## 2024-05-04 DIAGNOSIS — I4891 Unspecified atrial fibrillation: Secondary | ICD-10-CM | POA: Diagnosis not present

## 2024-05-09 NOTE — Progress Notes (Unsigned)
 Cardiology Office Note  Date:  05/10/2024   ID:  [name] Sr., DOB Feb 16, 1941, MRN [id number]  PCP:  [name], MD   Chief Complaint  Patient presents with   1 month follow up     Patient is here to follow up from his Zio monitor. Patient is scheduled for an Echo on (06/06/2024).  Patient c/o tiredness with any amount of activity. Denies chest pain or shortness of breath.      HPI:  [name] is a very pleasant 83 year old gentleman with  coronary artery disease,  bypass surgery in 2004,  occluded graft to the diagonal branch with 3 other patent grafts to the RCA, OM with a LIMA to the LAD by catheterization in 2007  Chronic renal insufficiency who presents for routine followup Of his coronary artery disease .   Last seen in clinic January 2024 Very active at baseline, feels well Splitting wood, farming, planting, hay Back problems , periodic cortisone  Sugars are good <130 for the past 6 months Less snacking before bed Stopped soda and sweet tea  Denies chest pain concerning for angina No lower extremity edema Reports that he cut back to Lasix  to every other day Improved renal function on lab work September 2025  Labs reviewed A1C 8.7 Total chol 140, LDL 60 Lots of bread, potato Creatinine 1.37  Prior history of orthostasis/syncope on higher dose Lasix   EKG personally reviewed by myself on todays visit EKG Interpretation Date/Time:  Tuesday May 10 2024 09:51:15 EDT Ventricular Rate:  60 PR Interval:    QRS Duration:  154 QT Interval:  472 QTC Calculation: 472 R Axis:   98  Text Interpretation: Atrial fibrillation Right bundle branch block When compared with ECG of 14-Apr-2024 07:57, No significant change was found Confirmed by Perla Lye [phone]) on 05/10/2024 10:19:12 AM   Other past medical history reviewed Seen by Dr. Dann 02/2022 and CTO PCI was felt be high risk, medical management recommended   admitted  05/2022 with heart failure.  Echo showed LVEF 45-50%, RV mildly reduced.  diuresed 8L w/ IV lasix .  GDMT Was limited by CKD. Jardiance  was added. D/c weight was 166lbs.   He saw Advanced Heart failure 07/02/22 and was overall stable.   He was admitted later that day on 12/6 with hyperkalemia with K>7.5. He was taking potassium 20mEQ BID instead of 20mEQ daily.Klor-con  was stopped. He was also treated for AKI with Scr up to 2.2. Metformin  was stopped for AKI.  Seen in the emergency room July 30, 2022, near syncope Denies loss of consciousness, was too weak to get up off the ground Was told at the scene by EMTs his blood pressure and heart rate were too low Multiple episodes like this over the past few months, near syncope/syncope Blood pressure 129/40 heart rate 61 in the ER Creatinine 1.76 sodium 130  Other past medical history reviewed Cardiac catheterization 12/22/2021 for angina Multivessel coronary artery disease including severe diffuse LAD and LCx disease.  There is also a short, chronic total occlusion of the mid RCA with bridging and left-right collaterals. Widely patent LIMA-LAD and sequential SVG-OM1-OM2. Chronically occluded SVG-D1. Normal left ventricular contraction (LVEF 55-65%) with normal filling pressure (LVEDP 10 mmHg). Unsuccessful attempted PCI to mid RCA due to inability to cross the lesion using Runthrough and Fielder XT wires (including with balloon support).   Medication changes made: isosorbide  mononitrate to 120 mg daily.    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, felt sugar was low, got clammy and lightheaded went into the house checked his sugar it was 43, ate a sandwich, [facility], started feeling better 1 hour later began lightheaded and clammy, had brief syncopal episode lasting several seconds, witnessed by his son  creatinine 1.46 hemoglobin 11 glucose 268  Echocardiogram February 01, 2020 ejection fraction 50 to  55%, Grade 2 diastolic dysfunction, right heart pressures 45 mmHg, hypokinesis basal inferoseptal wall  Stress test August 02, 2019 Ischemia in the basal and mid inferior and inferoseptal myocardium   PMH:   has a past medical history of Arthritis, Back pain, CAD (coronary artery disease), CKD (chronic kidney disease), stage II-III, Colon polyps, DM2 (diabetes mellitus, type 2) (HCC), GERD (gastroesophageal reflux disease), Glaucoma, Gout, HLD (hyperlipidemia), HTN (hypertension), and Neuropathy.  PSH:    Past Surgical History:  Procedure Laterality Date   BACK SURGERY     Cyst removed   CORONARY ARTERY BYPASS GRAFT  2004   CORONARY STENT INTERVENTION N/A 09/24/2021   Procedure: CORONARY STENT INTERVENTION;  Surgeon: Mady Bruckner, MD;  Location: [facility] INVASIVE CV LAB;  Service: Cardiovascular;  Laterality: N/A;   KNEE SURGERY     LEFT HEART CATH AND CORONARY ANGIOGRAPHY Left 09/24/2021   Procedure: LEFT HEART CATH AND CORONARY ANGIOGRAPHY;  Surgeon: Mady Bruckner, MD;  Location: [facility] INVASIVE CV LAB;  Service: Cardiovascular;  Laterality: Left;    Current Outpatient Medications  Medication Sig Dispense Refill   acetaminophen  (TYLENOL ) 650 MG CR tablet Take 650 mg by mouth every 8 (eight) hours as needed.     allopurinol  (ZYLOPRIM ) 100 MG tablet TAKE ONE TABLET (100 MG TOTAL) BY MOUTH DAILY. 90 tablet 2   apixaban  (ELIQUIS ) 5 MG TABS tablet Take 1 tablet (5 mg total) by mouth 2 (two) times daily. 60 tablet 11   atorvastatin  (LIPITOR) 20 MG tablet Take 1 tablet (20 mg total) by mouth daily. 90 tablet 3   BD SHARPS CONTAINER HOME MISC Dispose of syringes after B12 injections 1 each 5   Blood Glucose Monitoring Suppl (ACCU-CHEK AVIVA PLUS) w/Device KIT To check blood sugar once daily 1 kit 0   cetirizine (ZYRTEC) 10 MG tablet Take 10 mg by mouth daily. Take 1 tablet daily as needed for allergies     empagliflozin  (JARDIANCE ) 25 MG TABS tablet TAKE ONE TABLET (25 MG TOTAL) BY MOUTH  DAILY. 90 tablet 2   ferrous sulfate  325 (65 FE) MG tablet Take 325 mg by mouth daily with breakfast.     furosemide  (LASIX ) 20 MG tablet TAKE ONE TABLET BY MOUTH ONCE A DAY 90 tablet 3   gabapentin  (NEURONTIN ) 300 MG capsule TAKE ONE CAPSULE BY MOUTH TWICE DAILY 180 capsule 1   glipiZIDE  (GLUCOTROL  XL) 10 MG 24 hr tablet TAKE ONE TABLET BY MOUTH EVERY MORNING WITH BREAKFAST 90 tablet 1   glucose blood (ACCU-CHEK GUIDE TEST) test strip USE AS DIRECTED TO CHECK FASTING BLOOD SUGAR EVERY MORNING 100 strip 6   metoprolol  succinate (TOPROL -XL) 25 MG 24 hr tablet TAKE ONE TABLET (25 MG TOTAL) BY MOUTH IN THE MORNING AND AT BEDTIME. 180 tablet 1   nitroGLYCERIN  (NITROSTAT ) 0.4 MG SL tablet Place 1 tablet (0.4 mg total) under the tongue every 5 (five) minutes as needed for chest pain. 25 tablet 3   ranolazine  (RANEXA ) 1000 MG SR tablet TAKE ONE TABLET BY MOUTH TWO TIMES DAILY 180 tablet 3   Semaglutide  (RYBELSUS ) 14 MG TABS Take 1 tablet (14 mg total) by  mouth daily. 30 tablet 6   tamsulosin  (FLOMAX ) 0.4 MG CAPS capsule TAKE ONE CAPSULE (0.4 MG TOTAL) BY MOUTH DAILY. 90 capsule 3   Current Facility-Administered Medications  Medication Dose Route Frequency Provider Last Rate Last Admin   sodium chloride  flush (NS) 0.9 % injection 3 mL  3 mL Intravenous Q12H Furth, Cadence H, [name]         Allergies:   Ramipril   Social History:  The patient  reports that he has never smoked. His smokeless tobacco use includes chew. He reports that he does not drink alcohol and does not use drugs.   Family History:   family history includes Stroke in his mother.    Review of Systems: Review of Systems  Constitutional: Negative.   HENT: Negative.    Respiratory: Negative.    Cardiovascular: Negative.   Gastrointestinal: Negative.   Musculoskeletal: Negative.   Neurological: Negative.   Psychiatric/Behavioral: Negative.    All other systems reviewed and are negative.   PHYSICAL EXAM: VS:  BP (!) 140/70 (BP  Location: Left Arm, Patient Position: Sitting, Cuff Size: Normal)   Pulse 60   Ht 5' 8 (1.727 m)   Wt 163 lb (73.9 kg)   SpO2 98%   BMI 24.78 kg/m  , BMI Body mass index is 24.78 kg/m.  Constitutional:  oriented to person, place, and time. No distress.  HENT:  Head: Grossly normal Eyes:  no discharge. No scleral icterus.  Neck: No JVD, no carotid bruits  Cardiovascular: Regular rate and rhythm, no murmurs appreciated Pulmonary/Chest: Clear to auscultation bilaterally, no wheezes or rales Abdominal: Soft.  no distension.  no tenderness.  Musculoskeletal: Normal range of motion Neurological:  normal muscle tone. Coordination normal. No atrophy Skin: Skin warm and dry Psychiatric: normal affect, pleasant   Recent Labs: 04/14/2024: BUN 13; Creatinine, Ser 1.37; Hemoglobin 14.2; Magnesium 2.2; Platelets 208; Potassium 4.2; Sodium 140; TSH 2.470    Lipid Panel Lab Results  Component Value Date   CHOL 140 01/11/2024   HDL 34 (L) 01/11/2024   LDLCALC 60 01/11/2024   TRIG 291 (H) 01/11/2024    Wt Readings from Last 3 Encounters:  05/10/24 163 lb (73.9 kg)  04/14/24 164 lb 2 oz (74.4 kg)  03/31/24 166 lb (75.3 kg)      ASSESSMENT AND PLAN:  CAD with stable angina Currently with no symptoms of angina. No further workup at this time. Continue current medication regimen. catheterization February 2023 with severe three-vessel coronary disease Unsuccessful attempt RCA lesion Seen by [facility] clinic in [facility], medical management recommended Very active at baseline denies angina  Near syncope/syncope Blood pressure is well controlled on today's visit Denies orthostasis symptoms, will avoid overdiuresis  essential hypertension - Plan: EKG 12-Lead Blood pressure is well controlled on today's visit. No changes made to the medications.   Type 2 diabetes mellitus with complication, without long-term current use of insulin  (HCC) Reports that sugars are well-controlled at home less  than 130 most of the time, he cut back on soda and sweet tea in the evenings  Hyperlipidemia Cholesterol is at goal on the current lipid regimen. No changes to the medications were made.  Orders Placed This Encounter  Procedures   EKG 12-Lead     Signed, Velinda Lunger, M.D., Ph.D. 05/10/2024  [facility] [place], [place] [phone]

## 2024-05-10 ENCOUNTER — Ambulatory Visit: Attending: Cardiovascular Disease | Admitting: Cardiovascular Disease

## 2024-05-10 ENCOUNTER — Encounter: Payer: Self-pay | Admitting: Cardiovascular Disease

## 2024-05-10 VITALS — BP 140/70 | HR 60 | Ht 68.0 in | Wt 163.0 lb

## 2024-05-10 DIAGNOSIS — I5022 Chronic systolic (congestive) heart failure: Secondary | ICD-10-CM | POA: Diagnosis not present

## 2024-05-10 DIAGNOSIS — E782 Mixed hyperlipidemia: Secondary | ICD-10-CM | POA: Diagnosis not present

## 2024-05-10 DIAGNOSIS — I4891 Unspecified atrial fibrillation: Secondary | ICD-10-CM | POA: Diagnosis not present

## 2024-05-10 DIAGNOSIS — E1169 Type 2 diabetes mellitus with other specified complication: Secondary | ICD-10-CM

## 2024-05-10 DIAGNOSIS — I471 Supraventricular tachycardia, unspecified: Secondary | ICD-10-CM

## 2024-05-10 DIAGNOSIS — I25118 Atherosclerotic heart disease of native coronary artery with other forms of angina pectoris: Secondary | ICD-10-CM

## 2024-05-10 DIAGNOSIS — N183 Chronic kidney disease, stage 3 unspecified: Secondary | ICD-10-CM

## 2024-05-10 DIAGNOSIS — I951 Orthostatic hypotension: Secondary | ICD-10-CM

## 2024-05-10 DIAGNOSIS — I493 Ventricular premature depolarization: Secondary | ICD-10-CM

## 2024-05-10 DIAGNOSIS — E785 Hyperlipidemia, unspecified: Secondary | ICD-10-CM

## 2024-05-10 DIAGNOSIS — I10 Essential (primary) hypertension: Secondary | ICD-10-CM | POA: Diagnosis not present

## 2024-05-10 NOTE — Patient Instructions (Addendum)
 Medication Instructions:   No changes  Continue furosemide  every other day Extra as needed  If you need a refill on your cardiac medications before your next appointment, please call your pharmacy.   Lab work: No new labs needed  Testing/Procedures: No new testing needed  Follow-Up: At [facility], you and your health needs are our priority.  As part of our continuing mission to provide you with exceptional heart care, we have created designated Provider Care Teams.  These Care Teams include your primary Cardiologist (physician) and Advanced Practice Providers (APPs -  Physician Assistants and Nurse Practitioners) who all work together to provide you with the care you need, when you need it.  You will need a follow up appointment in 12 months  Providers on your designated Care Team:   Lonni Meager, NP Bernardino Bring, [name] Cadence [place], [place]  COVID-19 Vaccine Information can be found at: [email or web address] For questions related to vaccine distribution or appointments, please email [email or web address] or call [phone].

## 2024-05-12 ENCOUNTER — Ambulatory Visit: Admitting: Family Medicine

## 2024-05-15 ENCOUNTER — Other Ambulatory Visit: Payer: Self-pay | Admitting: Family Medicine

## 2024-05-15 DIAGNOSIS — I4891 Unspecified atrial fibrillation: Secondary | ICD-10-CM

## 2024-05-15 DIAGNOSIS — E1169 Type 2 diabetes mellitus with other specified complication: Secondary | ICD-10-CM

## 2024-05-17 DIAGNOSIS — E1140 Type 2 diabetes mellitus with diabetic neuropathy, unspecified: Secondary | ICD-10-CM | POA: Diagnosis not present

## 2024-05-17 DIAGNOSIS — S90122A Contusion of left lesser toe(s) without damage to nail, initial encounter: Secondary | ICD-10-CM | POA: Diagnosis not present

## 2024-05-17 DIAGNOSIS — S9032XA Contusion of left foot, initial encounter: Secondary | ICD-10-CM | POA: Diagnosis not present

## 2024-05-17 DIAGNOSIS — L03032 Cellulitis of left toe: Secondary | ICD-10-CM | POA: Diagnosis not present

## 2024-05-17 DIAGNOSIS — B351 Tinea unguium: Secondary | ICD-10-CM | POA: Diagnosis not present

## 2024-05-18 NOTE — Progress Notes (Unsigned)
 Cardiology Office Note    Date:  05/19/2024   ID:  [name] Stanton Bouche Sr., DOB July 04, 1941, MRN [id number]  PCP:  [name], MD  Cardiologist:  [name], MD  Electrophysiologist:  [name], MD   Chief Complaint: Follow-up  History of Present Illness:   [name] Sr. is a 83 y.o. 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    Patient has a history of CABG x 4 in 2004 with subsequent catheterization in 2007 revealing 3 out of 4 patent grafts (occluded vein graft to the diagonal).  Myoview  07/2019 was abnormal and high risk with possible ischemia, LVEF 43%.  Echo 07/2019 showed EF 50 to 55%, G2 DD, WMA, mildly dilated LA, and moderately elevated pulmonary artery pressure.  Patient was without symptoms of angina at that time and no further ischemic evaluation was pursued.  Seen 08/2021 reporting exertional chest pain.  Lexiscan  Myoview  showed ischemia in the inferior wall with perfusion defect, moderate reversible in the inferior wall and apical region with EF 43%, moderate risk scan.  Cardiac catheterization 08/2021 revealed multivessel CAD including severe diffuse LAD and left circumflex disease.  Short chronic total occlusion of the mid RCA with bridging and left-to-right collaterals was noted.  Widely patent LIMA to LAD and sequential SVG to OM and OM 2.  Chronically occluded SVG to D1.  Normal LVEF with normal filling pressures.  Unsuccessful attempted PCI to the mid RCA due to inability to cross the lesion using run-through and Fielder XT wires.  Recommendation for escalation of antianginal therapy with Imdur  increased to 120 mg daily.  Saw Dr. Jackolyn 02/2022 and CTO PCI was felt to be high risk with activity modification recommended.  He was admitted 05/2022 with heart failure  exacerbation.  Echo showed EF 45 to 50%, RV mildly reduced.  He was diuresed 8 L with IV Lasix .  He was transition to oral Lasix .  GDMT limited by CKD.  Discharge weight was 166 pounds.  Admitted again 06/2022 with hyperkalemia, K greater than 7.5.  He had been taking potassium 20 mill equivalents twice daily rather than his prescribed daily dose.  He was also treated for AKI during this stay with creatinine up to 2.2.  Seen in follow-up 08/2022 reporting dizziness with positive orthostatics.  Coreg  was discontinued and Lasix  dose was reduced.  2-week heart monitor was ordered and revealed 164 episodes of SVT the longest of which lasting 5 minutes and 44 seconds.  Also noted to have 2.5% burden of PACs and 11% burden of PVCs.  Patient was referred to EP and seen by Dr. HOLTS 11/2022.  He was started on metoprolol  succinate 25 mg twice daily.  He was not felt to be a candidate for EP study or ablation given likely etiology of arrhythmia being atrial tachycardia.    Patient was seen by myself 04/14/2024 and noted to be in new atrial fibrillation which was rate controlled on his current dose of metoprolol .  He was asymptomatic to the arrhythmia.  He was started on Eliquis  5 mg twice daily for CHA2DS2-VASc of 6. ZIO monitor was ordered and showed 100% A-fib burden with an average rate of 63 bpm.    Seen for follow up in our office 05/10/2024 with EKG showing atrial fibrillation. Does not appear atrial fibrillation was addressed at that visit.  Patient presents today overall doing well.  He continues to very active and has been chopping wood to prepare for winter.  He remains in atrial fibrillation, although he is asymptomatic to this.  He denies fatigue, shortness of breath, chest pain, palpitations, lightheadedness, dizziness, and lower extremity swelling.  He reports compliance with his Eliquis  without any missed doses.  He denies bleeding and hematochezia.  Labs independently reviewed: 03/2024-BUN 13,  creatinine 1.37, sodium 140, potassium 5.2, Hgb 14.2, HCT 42.9, mag 2.2, TSH normal 12/2023-TC 140, TG 291, HDL 34, LDL 60, A1c 8.7  Objective   Past Medical History:  Diagnosis Date   Arthritis    Back pain    hx   CAD (coronary artery disease)    a. 2004 s/p CABG x 4 (LIMA->LAD, VG->Diag, VG->OM, VG->RCA); b. 2007 Cath: VG->Diag occluded, otw 3/4 patent grafts.   CKD (chronic kidney disease), stage II-III    Colon polyps    DM2 (diabetes mellitus, type 2) (HCC)    stable   GERD (gastroesophageal reflux disease)    Glaucoma    Gout    HLD (hyperlipidemia)    HTN (hypertension)    Neuropathy     Current Medications: Current Meds  Medication Sig   acetaminophen  (TYLENOL ) 650 MG CR tablet Take 650 mg by mouth every 8 (eight) hours as needed.   allopurinol  (ZYLOPRIM ) 100 MG tablet TAKE ONE TABLET (100 MG TOTAL) BY MOUTH DAILY.   apixaban  (ELIQUIS ) 5 MG TABS tablet Take 1 tablet (5 mg total) by mouth 2 (two) times daily.   atorvastatin  (LIPITOR) 20 MG tablet Take 1 tablet (20 mg total) by mouth daily.   BD SHARPS CONTAINER HOME MISC Dispose of syringes after B12 injections   Blood Glucose Monitoring Suppl (ACCU-CHEK AVIVA PLUS) w/Device KIT To check blood sugar once daily   cetirizine (ZYRTEC) 10 MG tablet Take 10 mg by mouth daily. Take 1 tablet daily as needed for allergies   empagliflozin  (JARDIANCE ) 25 MG TABS tablet TAKE ONE TABLET (25 MG TOTAL) BY MOUTH DAILY.   ferrous sulfate  325 (65 FE) MG tablet Take 325 mg by mouth daily with breakfast.   furosemide  (LASIX ) 20 MG tablet TAKE ONE TABLET BY MOUTH ONCE A DAY   gabapentin  (NEURONTIN ) 300 MG capsule TAKE ONE CAPSULE BY MOUTH TWICE DAILY   glipiZIDE  (GLUCOTROL  XL) 10 MG 24 hr tablet TAKE ONE TABLET BY MOUTH EVERY MORNING WITH BREAKFAST   glucose blood (ACCU-CHEK GUIDE TEST) test strip USE AS DIRECTED TO CHECK FASTING BLOOD SUGAR EVERY MORNING   metoprolol  succinate (TOPROL -XL) 25 MG 24 hr tablet TAKE ONE TABLET (25 MG TOTAL)  BY MOUTH IN THE MORNING AND AT BEDTIME.   nitroGLYCERIN  (NITROSTAT ) 0.4 MG SL tablet Place 1 tablet (0.4 mg total) under the tongue every 5 (five) minutes as needed for chest pain.   ranolazine  (RANEXA ) 1000 MG SR tablet TAKE ONE TABLET BY MOUTH TWO TIMES DAILY   RYBELSUS  14 MG TABS TAKE 1 TABLET BY MOUTH EVERY DAY   tamsulosin  (FLOMAX ) 0.4 MG CAPS capsule TAKE ONE CAPSULE (0.4 MG TOTAL) BY MOUTH DAILY.   Current Facility-Administered Medications for the 05/19/24 encounter (Office Visit) with Lorene Lesley CROME, [name]  Medication   sodium chloride  flush (NS) 0.9 % injection 3 mL    Allergies:   Ramipril   Social History   Socioeconomic History   Marital status: Widowed    Spouse name: Not on file   Number of children: 3   Years of education:  Not on file   Highest education level: 8th grade  Occupational History   Occupation: Retired  Tobacco Use   Smoking status: Never   Smokeless tobacco: Current    Types: Chew   Tobacco comments:    chew tobacco - 1 pack last 3 weeks  Vaping Use   Vaping status: Never Used  Substance and Sexual Activity   Alcohol use: No    Alcohol/week: 0.0 standard drinks of alcohol   Drug use: No   Sexual activity: Not Currently  Other Topics Concern   Not on file  Social History Narrative   Not on file   Social Drivers of Health   Financial Resource Strain: Low Risk  (05/17/2024)   Received from [facility]   Overall Financial Resource Strain (CARDIA)    Difficulty of Paying Living Expenses: Not hard at all  Food Insecurity: No Food Insecurity (05/17/2024)   Received from [facility]   Hunger Vital Sign    Within the past 12 months, you worried that your food would run out before you got the money to buy more.: Never true    Within the past 12 months, the food you bought just didn't last and you didn't have money to get more.: Never true  Transportation Needs: No Transportation Needs (05/17/2024)   Received  from [facility] - Transportation    In the past 12 months, has lack of transportation kept you from medical appointments or from getting medications?: No    Lack of Transportation (Non-Medical): No  Physical Activity: Inactive (12/01/2022)   Exercise Vital Sign    Days of Exercise per Week: 0 days    Minutes of Exercise per Session: 0 min  Stress: No Stress Concern Present (09/24/2023)   [employer] - Occupational Stress Questionnaire    Feeling of Stress : Not at all  Social Connections: Unknown (12/01/2022)   Social Connection and Isolation Panel    Frequency of Communication with Friends and Family: More than three times a week    Frequency of Social Gatherings with Friends and Family: More than three times a week    Attends Religious Services: Not on file    Active Member of Clubs or Organizations: No    Attends Banker Meetings: Never    Marital Status: Widowed     Family History:  The patient's family history includes Stroke in his mother.  ROS:   12-point review of systems is negative unless otherwise noted in the HPI.  EKGs/Other Studies Reviewed:    Studies reviewed were summarized above. The additional studies were reviewed today:  05/15/2024 Long term monitor Atrial Fibrillation occurred continuously (100% burden), ranging from 41-119 bpm (avg of 63 bpm).  Isolated VEs were frequent (5.1%, 47503), VE Couplets were rare (<1.0%, 186), and VE Triplets were rare  (<1.0%, 1).   EKG:  EKG personally reviewed by me today EKG Interpretation Date/Time:  Thursday May 19 2024 08:30:59 EDT Ventricular Rate:  63 PR Interval:    QRS Duration:  152 QT Interval:  462 QTC Calculation: 472 R Axis:   85  Text Interpretation: Atrial fibrillation Right bundle branch block When compared with ECG of 10-May-2024 09:51, Current undetermined rhythm precludes rhythm comparison, needs review Confirmed by Lorene Sinclair  [phone]) on 05/19/2024 8:32:53 AM  PHYSICAL EXAM:    VS:  BP (!) 143/70 (BP Location: Left Arm, Patient Position: Sitting, Cuff Size: Normal)  Pulse 63 Comment: 65 oximeter  Ht 5' 4 (1.626 m)   Wt 162 lb 12.8 oz (73.8 kg)   SpO2 100%   BMI 27.94 kg/m   BMI: Body mass index is 27.94 kg/m.  GEN: Well nourished, well developed in no acute distress NECK: No JVD; No carotid bruits CARDIAC: IRIR, no murmurs, rubs, gallops RESPIRATORY:  Clear to auscultation without rales, wheezing or rhonchi  ABDOMEN: Soft, non-tender, non-distended EXTREMITIES: No edema; No deformity  Wt Readings from Last 3 Encounters:  05/19/24 162 lb 12.8 oz (73.8 kg)  05/10/24 163 lb (73.9 kg)  04/14/24 164 lb 2 oz (74.4 kg)                  ASSESSMENT & PLAN:   Persistent atrial fibrillation - Seen by myself 9/18 and found to be in new onset atrial fibrillation.  ZIO monitor showed 100% A-fib burden.  He remains in atrial fibrillation today which is rate controlled on his current dose of metoprolol  succinate 25 mg twice daily. He is overall asymptomatic but I would still recommend trying to restore sinus rhythm.  Recommend proceeding with DCCV.  CHA2DS2-VASc 6.  He is continued on Eliquis  5 mg twice daily and has not missed any doses.  Updated echo is scheduled for next month.  Coronary artery disease  - Cardiac catheterization 08/2021 with severe three-vessel CAD and unsuccessful PCI attempt to the RCA.  Seen by C[facility] clinic in [facility] with medical management recommended.  No symptoms of exertional angina.  No further testing indicated at this time.  He is on Eliquis  in place of aspirin .  He is continued on atorvastatin  20 mg daily, metoprolol  succinate 25 mg twice daily, and Ranexa  1000 mg twice daily.  HFmrEF - Most recent echo 05/2022 with EF 45 to 50%.  He did not tolerate carvedilol  in the past.  Appears euvolemic on exam today.  He is continued on Jardiance  25 mg daily, Lasix  20 mg every other day,  and metoprolol  succinate 25 mg daily.  Will consider addition of ARB at follow-up.  Updated echo is scheduled for next month.  PSVT PVCs - Asymptomatic.  Followed by EP.  Continued on metoprolol  as above.  Hypertension - BP mildly elevated today.  Consider addition of ARB at follow-up as above.  Hyperlipidemia - Most recent lipid panel 12/2023 with LDL 60.  He is continued on atorvastatin  20 mg daily.    Disposition: CBC and BMP today. DCCV on 10/29. F/u with Dr. Gollan or me 1-2 weeks after DCCV.   Informed Consent   Shared Decision Making/Informed Consent The risks (stroke, cardiac arrhythmias rarely resulting in the need for a temporary or permanent pacemaker, skin irritation or burns and complications associated with conscious sedation including aspiration, arrhythmia, respiratory failure and death), benefits (restoration of normal sinus rhythm) and alternatives of a direct current cardioversion were explained in detail to Mr. Falls and he agrees to proceed.        Medication Adjustments/Labs and Tests Ordered: Current medicines are reviewed at length with the patient today.  Concerns regarding medicines are outlined above. Medication changes, Labs and Tests ordered today are summarized above and listed in the Patient Instructions accessible in Encounters.   Bonney Lesley Maffucci, [name] 05/19/2024 8:54 AM     [facility] [place] [address] [place], [place] [address] [phone]

## 2024-05-18 NOTE — H&P (View-Only) (Signed)
 Cardiology Office Note    Date:  05/19/2024   ID:  [name] Stanton Bouche Sr., DOB July 04, 1941, MRN [id number]  PCP:  [name], MD  Cardiologist:  [name], MD  Electrophysiologist:  [name], MD   Chief Complaint: Follow-up  History of Present Illness:   [name] Ballin Riverton Sr. is a 83 y.o. 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    Patient has a history of CABG x 4 in 2004 with subsequent catheterization in 2007 revealing 3 out of 4 patent grafts (occluded vein graft to the diagonal).  Myoview  07/2019 was abnormal and high risk with possible ischemia, LVEF 43%.  Echo 07/2019 showed EF 50 to 55%, G2 DD, WMA, mildly dilated LA, and moderately elevated pulmonary artery pressure.  Patient was without symptoms of angina at that time and no further ischemic evaluation was pursued.  Seen 08/2021 reporting exertional chest pain.  Lexiscan  Myoview  showed ischemia in the inferior wall with perfusion defect, moderate reversible in the inferior wall and apical region with EF 43%, moderate risk scan.  Cardiac catheterization 08/2021 revealed multivessel CAD including severe diffuse LAD and left circumflex disease.  Short chronic total occlusion of the mid RCA with bridging and left-to-right collaterals was noted.  Widely patent LIMA to LAD and sequential SVG to OM and OM 2.  Chronically occluded SVG to D1.  Normal LVEF with normal filling pressures.  Unsuccessful attempted PCI to the mid RCA due to inability to cross the lesion using run-through and Fielder XT wires.  Recommendation for escalation of antianginal therapy with Imdur  increased to 120 mg daily.  Saw Dr. Jackolyn 02/2022 and CTO PCI was felt to be high risk with activity modification recommended.  He was admitted 05/2022 with heart failure  exacerbation.  Echo showed EF 45 to 50%, RV mildly reduced.  He was diuresed 8 L with IV Lasix .  He was transition to oral Lasix .  GDMT limited by CKD.  Discharge weight was 166 pounds.  Admitted again 06/2022 with hyperkalemia, K greater than 7.5.  He had been taking potassium 20 mill equivalents twice daily rather than his prescribed daily dose.  He was also treated for AKI during this stay with creatinine up to 2.2.  Seen in follow-up 08/2022 reporting dizziness with positive orthostatics.  Coreg  was discontinued and Lasix  dose was reduced.  2-week heart monitor was ordered and revealed 164 episodes of SVT the longest of which lasting 5 minutes and 44 seconds.  Also noted to have 2.5% burden of PACs and 11% burden of PVCs.  Patient was referred to EP and seen by Dr. HOLTS 11/2022.  He was started on metoprolol  succinate 25 mg twice daily.  He was not felt to be a candidate for EP study or ablation given likely etiology of arrhythmia being atrial tachycardia.    Patient was seen by myself 04/14/2024 and noted to be in new atrial fibrillation which was rate controlled on his current dose of metoprolol .  He was asymptomatic to the arrhythmia.  He was started on Eliquis  5 mg twice daily for CHA2DS2-VASc of 6. ZIO monitor was ordered and showed 100% A-fib burden with an average rate of 63 bpm.    Seen for follow up in our office 05/10/2024 with EKG showing atrial fibrillation. Does not appear atrial fibrillation was addressed at that visit.  Patient presents today overall doing well.  He continues to very active and has been chopping wood to prepare for winter.  He remains in atrial fibrillation, although he is asymptomatic to this.  He denies fatigue, shortness of breath, chest pain, palpitations, lightheadedness, dizziness, and lower extremity swelling.  He reports compliance with his Eliquis  without any missed doses.  He denies bleeding and hematochezia.  Labs independently reviewed: 03/2024-BUN 13,  creatinine 1.37, sodium 140, potassium 5.2, Hgb 14.2, HCT 42.9, mag 2.2, TSH normal 12/2023-TC 140, TG 291, HDL 34, LDL 60, A1c 8.7  Objective   Past Medical History:  Diagnosis Date   Arthritis    Back pain    hx   CAD (coronary artery disease)    a. 2004 s/p CABG x 4 (LIMA->LAD, VG->Diag, VG->OM, VG->RCA); b. 2007 Cath: VG->Diag occluded, otw 3/4 patent grafts.   CKD (chronic kidney disease), stage II-III    Colon polyps    DM2 (diabetes mellitus, type 2) (HCC)    stable   GERD (gastroesophageal reflux disease)    Glaucoma    Gout    HLD (hyperlipidemia)    HTN (hypertension)    Neuropathy     Current Medications: Current Meds  Medication Sig   acetaminophen  (TYLENOL ) 650 MG CR tablet Take 650 mg by mouth every 8 (eight) hours as needed.   allopurinol  (ZYLOPRIM ) 100 MG tablet TAKE ONE TABLET (100 MG TOTAL) BY MOUTH DAILY.   apixaban  (ELIQUIS ) 5 MG TABS tablet Take 1 tablet (5 mg total) by mouth 2 (two) times daily.   atorvastatin  (LIPITOR) 20 MG tablet Take 1 tablet (20 mg total) by mouth daily.   BD SHARPS CONTAINER HOME MISC Dispose of syringes after B12 injections   Blood Glucose Monitoring Suppl (ACCU-CHEK AVIVA PLUS) w/Device KIT To check blood sugar once daily   cetirizine (ZYRTEC) 10 MG tablet Take 10 mg by mouth daily. Take 1 tablet daily as needed for allergies   empagliflozin  (JARDIANCE ) 25 MG TABS tablet TAKE ONE TABLET (25 MG TOTAL) BY MOUTH DAILY.   ferrous sulfate  325 (65 FE) MG tablet Take 325 mg by mouth daily with breakfast.   furosemide  (LASIX ) 20 MG tablet TAKE ONE TABLET BY MOUTH ONCE A DAY   gabapentin  (NEURONTIN ) 300 MG capsule TAKE ONE CAPSULE BY MOUTH TWICE DAILY   glipiZIDE  (GLUCOTROL  XL) 10 MG 24 hr tablet TAKE ONE TABLET BY MOUTH EVERY MORNING WITH BREAKFAST   glucose blood (ACCU-CHEK GUIDE TEST) test strip USE AS DIRECTED TO CHECK FASTING BLOOD SUGAR EVERY MORNING   metoprolol  succinate (TOPROL -XL) 25 MG 24 hr tablet TAKE ONE TABLET (25 MG TOTAL)  BY MOUTH IN THE MORNING AND AT BEDTIME.   nitroGLYCERIN  (NITROSTAT ) 0.4 MG SL tablet Place 1 tablet (0.4 mg total) under the tongue every 5 (five) minutes as needed for chest pain.   ranolazine  (RANEXA ) 1000 MG SR tablet TAKE ONE TABLET BY MOUTH TWO TIMES DAILY   RYBELSUS  14 MG TABS TAKE 1 TABLET BY MOUTH EVERY DAY   tamsulosin  (FLOMAX ) 0.4 MG CAPS capsule TAKE ONE CAPSULE (0.4 MG TOTAL) BY MOUTH DAILY.   Current Facility-Administered Medications for the 05/19/24 encounter (Office Visit) with Lorene Lesley CROME, [name]  Medication   sodium chloride  flush (NS) 0.9 % injection 3 mL    Allergies:   Ramipril   Social History   Socioeconomic History   Marital status: Widowed    Spouse name: Not on file   Number of children: 3   Years of education:  Not on file   Highest education level: 8th grade  Occupational History   Occupation: Retired  Tobacco Use   Smoking status: Never   Smokeless tobacco: Current    Types: Chew   Tobacco comments:    chew tobacco - 1 pack last 3 weeks  Vaping Use   Vaping status: Never Used  Substance and Sexual Activity   Alcohol use: No    Alcohol/week: 0.0 standard drinks of alcohol   Drug use: No   Sexual activity: Not Currently  Other Topics Concern   Not on file  Social History Narrative   Not on file   Social Drivers of Health   Financial Resource Strain: Low Risk  (05/17/2024)   Received from [facility]   Overall Financial Resource Strain (CARDIA)    Difficulty of Paying Living Expenses: Not hard at all  Food Insecurity: No Food Insecurity (05/17/2024)   Received from [facility]   Hunger Vital Sign    Within the past 12 months, you worried that your food would run out before you got the money to buy more.: Never true    Within the past 12 months, the food you bought just didn't last and you didn't have money to get more.: Never true  Transportation Needs: No Transportation Needs (05/17/2024)   Received  from [facility] - Transportation    In the past 12 months, has lack of transportation kept you from medical appointments or from getting medications?: No    Lack of Transportation (Non-Medical): No  Physical Activity: Inactive (12/01/2022)   Exercise Vital Sign    Days of Exercise per Week: 0 days    Minutes of Exercise per Session: 0 min  Stress: No Stress Concern Present (09/24/2023)   [employer] of Occupational Health - Occupational Stress Questionnaire    Feeling of Stress : Not at all  Social Connections: Unknown (12/01/2022)   Social Connection and Isolation Panel    Frequency of Communication with Friends and Family: More than three times a week    Frequency of Social Gatherings with Friends and Family: More than three times a week    Attends Religious Services: Not on file    Active Member of Clubs or Organizations: No    Attends Banker Meetings: Never    Marital Status: Widowed     Family History:  The patient's family history includes Stroke in his mother.  ROS:   12-point review of systems is negative unless otherwise noted in the HPI.  EKGs/Other Studies Reviewed:    Studies reviewed were summarized above. The additional studies were reviewed today:  05/15/2024 Long term monitor Atrial Fibrillation occurred continuously (100% burden), ranging from 41-119 bpm (avg of 63 bpm).  Isolated VEs were frequent (5.1%, 47503), VE Couplets were rare (<1.0%, 186), and VE Triplets were rare  (<1.0%, 1).   EKG:  EKG personally reviewed by me today EKG Interpretation Date/Time:  Thursday May 19 2024 08:30:59 EDT Ventricular Rate:  63 PR Interval:    QRS Duration:  152 QT Interval:  462 QTC Calculation: 472 R Axis:   85  Text Interpretation: Atrial fibrillation Right bundle branch block When compared with ECG of 10-May-2024 09:51, Current undetermined rhythm precludes rhythm comparison, needs review Confirmed by Lorene Sinclair  [phone]) on 05/19/2024 8:32:53 AM  PHYSICAL EXAM:    VS:  BP (!) 143/70 (BP Location: Left Arm, Patient Position: Sitting, Cuff Size: Normal)  Pulse 63 Comment: 65 oximeter  Ht 5' 4 (1.626 m)   Wt 162 lb 12.8 oz (73.8 kg)   SpO2 100%   BMI 27.94 kg/m   BMI: Body mass index is 27.94 kg/m.  GEN: Well nourished, well developed in no acute distress NECK: No JVD; No carotid bruits CARDIAC: IRIR, no murmurs, rubs, gallops RESPIRATORY:  Clear to auscultation without rales, wheezing or rhonchi  ABDOMEN: Soft, non-tender, non-distended EXTREMITIES: No edema; No deformity  Wt Readings from Last 3 Encounters:  05/19/24 162 lb 12.8 oz (73.8 kg)  05/10/24 163 lb (73.9 kg)  04/14/24 164 lb 2 oz (74.4 kg)                  ASSESSMENT & PLAN:   Persistent atrial fibrillation - Seen by myself 9/18 and found to be in new onset atrial fibrillation.  ZIO monitor showed 100% A-fib burden.  He remains in atrial fibrillation today which is rate controlled on his current dose of metoprolol  succinate 25 mg twice daily. He is overall asymptomatic but I would still recommend trying to restore sinus rhythm.  Recommend proceeding with DCCV.  CHA2DS2-VASc 6.  He is continued on Eliquis  5 mg twice daily and has not missed any doses.  Updated echo is scheduled for next month.  Coronary artery disease  - Cardiac catheterization 08/2021 with severe three-vessel CAD and unsuccessful PCI attempt to the RCA.  Seen by CTO clinic in [facility] with medical management recommended.  No symptoms of exertional angina.  No further testing indicated at this time.  He is on Eliquis  in place of aspirin .  He is continued on atorvastatin  20 mg daily, metoprolol  succinate 25 mg twice daily, and Ranexa  1000 mg twice daily.  HFmrEF - Most recent echo 05/2022 with EF 45 to 50%.  He did not tolerate carvedilol  in the past.  Appears euvolemic on exam today.  He is continued on Jardiance  25 mg daily, Lasix  20 mg every other day,  and metoprolol  succinate 25 mg daily.  Will consider addition of ARB at follow-up.  Updated echo is scheduled for next month.  PSVT PVCs - Asymptomatic.  Followed by EP.  Continued on metoprolol  as above.  Hypertension - BP mildly elevated today.  Consider addition of ARB at follow-up as above.  Hyperlipidemia - Most recent lipid panel 12/2023 with LDL 60.  He is continued on atorvastatin  20 mg daily.    Disposition: CBC and BMP today. DCCV on 10/29. F/u with Dr. Gollan or me 1-2 weeks after DCCV.   Informed Consent   Shared Decision Making/Informed Consent The risks (stroke, cardiac arrhythmias rarely resulting in the need for a temporary or permanent pacemaker, skin irritation or burns and complications associated with conscious sedation including aspiration, arrhythmia, respiratory failure and death), benefits (restoration of normal sinus rhythm) and alternatives of a direct current cardioversion were explained in detail to [name] and he agrees to proceed.        Medication Adjustments/Labs and Tests Ordered: Current medicines are reviewed at length with the patient today.  Concerns regarding medicines are outlined above. Medication changes, Labs and Tests ordered today are summarized above and listed in the Patient Instructions accessible in Encounters.   Bonney Lesley Maffucci, [name] 05/19/2024 8:54 AM     [facility] [place] [address] [place], [place] [address] [phone]

## 2024-05-19 ENCOUNTER — Encounter: Payer: Self-pay | Admitting: Cardiology

## 2024-05-19 ENCOUNTER — Ambulatory Visit: Attending: Cardiology | Admitting: Physician Assistant

## 2024-05-19 ENCOUNTER — Telehealth: Payer: Self-pay | Admitting: Cardiovascular Disease

## 2024-05-19 ENCOUNTER — Other Ambulatory Visit: Payer: Self-pay

## 2024-05-19 VITALS — BP 143/70 | HR 63 | Ht 64.0 in | Wt 162.8 lb

## 2024-05-19 DIAGNOSIS — I4819 Other persistent atrial fibrillation: Secondary | ICD-10-CM | POA: Diagnosis not present

## 2024-05-19 DIAGNOSIS — E785 Hyperlipidemia, unspecified: Secondary | ICD-10-CM | POA: Diagnosis not present

## 2024-05-19 DIAGNOSIS — I10 Essential (primary) hypertension: Secondary | ICD-10-CM

## 2024-05-19 DIAGNOSIS — I25118 Atherosclerotic heart disease of native coronary artery with other forms of angina pectoris: Secondary | ICD-10-CM | POA: Diagnosis not present

## 2024-05-19 DIAGNOSIS — I493 Ventricular premature depolarization: Secondary | ICD-10-CM | POA: Diagnosis not present

## 2024-05-19 DIAGNOSIS — I4891 Unspecified atrial fibrillation: Secondary | ICD-10-CM

## 2024-05-19 DIAGNOSIS — I471 Supraventricular tachycardia, unspecified: Secondary | ICD-10-CM

## 2024-05-19 DIAGNOSIS — I5020 Unspecified systolic (congestive) heart failure: Secondary | ICD-10-CM

## 2024-05-19 MED ORDER — METOPROLOL SUCCINATE ER 25 MG PO TB24: 25.0000 mg | ORAL_TABLET | Freq: Two times a day (BID) | ORAL | 3 refills | Status: DC

## 2024-05-19 MED ORDER — NITROGLYCERIN 0.4 MG SL SUBL: 0.4000 mg | SUBLINGUAL_TABLET | SUBLINGUAL | 3 refills | Status: AC | PRN

## 2024-05-19 MED ORDER — METOPROLOL SUCCINATE ER 25 MG PO TB24: 25.0000 mg | ORAL_TABLET | Freq: Every day | ORAL | 3 refills | Status: DC

## 2024-05-19 NOTE — Telephone Encounter (Signed)
 Called and spoke with Daphne at [facility] in regards to patient medication. Reached out to Lesley Maffucci, [name] to see how she wanted this medication filled. She stated was okay to refill at 25 mg twice daily. Pharmacist will call patient with information.

## 2024-05-19 NOTE — Telephone Encounter (Signed)
 Pt c/o medication issue:  1. Name of Medication: metoprolol  succinate (TOPROL -XL) 25 MG 24 hr tablet   2. How are you currently taking this medication (dosage and times per day)?  Take 1 tablet (25 mg total) by mouth daily.      3. Are you having a reaction (difficulty breathing--STAT)? No  4. What is your medication issue? Brandi at [facility] is requesting a callback regarding them needing verification and clarity on how this medication should be taken. She stated Dr. Cindie had sent in a prescription for this medication back in June for pt to take 1 tablet in the morning and 1 at night. Pt is now confused since he wasn't made aware of the switch. Please advise

## 2024-05-19 NOTE — Patient Instructions (Signed)
 Medication Instructions:  Your physician recommends that you continue on your current medications as directed. Please refer to the Current Medication list given to you today.   *If you need a refill on your cardiac medications before your next appointment, please call your pharmacy*  Lab Work: Your provider would like for you to have following labs drawn today CBC BMP.   If you have labs (blood work) drawn today and your tests are completely normal, you will receive your results only by: MyChart Message (if you have MyChart) OR A paper copy in the mail If you have any lab test that is abnormal or we need to change your treatment, we will call you to review the results.  Testing/Procedures:   Dear [name] Stanton Bouche Sr.  You are scheduled for a Cardioversion on Wednesday, October 29 with Dr. Gollan.  Please arrive at the [facility] Entrance of [facility], [address], [place] [address] at 11:00 AM (This is 1 hour(s) prior to your procedure time).  Proceed to the Check-In Desk directly inside the entrance.  Procedure Parking: Use the entrance off of the [address] side of the hospital. Turn right upon entering and follow the driveway to parking that is directly in front of the Heart [facility] Vascular Center. There is no valet parking available at this entrance, however there is an awning directly in front of the Heart & Vascular Center for drop off/ pick up for patients.   DIET:  Nothing to eat or drink after midnight except a sip of water  with medications (see medication instructions below)  MEDICATION INSTRUCTIONS: !!IF ANY NEW MEDICATIONS ARE STARTED AFTER TODAY, PLEASE NOTIFY YOUR PROVIDER AS SOON AS POSSIBLE!!  FYI: Medications such as Semaglutide  (Ozempic, Wegovy), Tirzepatide (Mounjaro, Zepbound), Dulaglutide (Trulicity), etc (GLP1 agonists) AND Canagliflozin (Invokana), Dapagliflozin  (Farxiga ), Empagliflozin  (Jardiance ), Ertugliflozin (Steglatro), Bexagliflozin  [employer]) or any combination with one of these drugs such as Invokamet (Canagliflozin/Metformin ), Synjardy (Empagliflozin /Metformin ), etc (SGLT2 inhibitors) must be held around the time of a procedure. This is not a comprehensive list of all of these drugs. Please review all of your medications and talk to your provider if you take any one of these. If you are not sure, ask your provider.          HOLD: Semaglutide  (Ozempic, Rybelsus , Wegovy) for 1 day prior to the procedure. Last dose on Monday, October 27.               HOLD: Empagliflozin  (Jardiance ) for 3 days prior to the procedure. Last dose on Saturday, October 25.              Continue taking your anticoagulant (blood thinner): Apixaban  (Eliquis ).  You will need to continue this after your procedure until you are told by your provider that it is safe to stop.    LABS: done in office 10/23  FYI:  For your safety, and to allow us  to monitor your vital signs accurately during the surgery/procedure we request: If you have artificial nails, gel coating, SNS etc, please have those removed prior to your surgery/procedure. Not having the nail coverings /polish removed may result in cancellation or delay of your surgery/procedure.  Your support person will be asked to wait in the waiting room during your procedure.  It is OK to have someone drop you off and come back when you are ready to be discharged.  You cannot drive after the procedure and will need someone to drive you home.  Bring  your insurance cards.  *Special Note: Every effort is made to have your procedure done on time. Occasionally there are emergencies that occur at the hospital that may cause delays. Please be patient if a delay does occur.      Follow-Up: At [facility], you and your health needs are our priority.  As part of our continuing mission to provide you with exceptional heart care, our providers are all part of one team.  This team includes your primary  Cardiologist (physician) and Advanced Practice Providers or APPs (Physician Assistants and Nurse Practitioners) who all work together to provide you with the care you need, when you need it.  Your next appointment:   1 week(s) post CV  Provider:   You may see Timothy Gollan, MD or one of the following Advanced Practice Providers on your designated Care Team:   Lesley Maffucci, [name]  We recommend signing up for the patient portal called MyChart.  Sign up information is provided on this After Visit Summary.  MyChart is used to connect with patients for Virtual Visits (Telemedicine).  Patients are able to view lab/test results, encounter notes, upcoming appointments, etc.  Non-urgent messages can be sent to your provider as well.   To learn more about what you can do with MyChart, go to [email or web address].

## 2024-05-20 LAB — BASIC METABOLIC PANEL WITH GFR
BUN/Creatinine Ratio: 12 (ref 10–24)
BUN: 18 mg/dL (ref 8–27)
CO2: 21 mmol/L (ref 20–29)
Calcium: 9.3 mg/dL (ref 8.6–10.2)
Chloride: 104 mmol/L (ref 96–106)
Creatinine, Ser: 1.55 mg/dL — ABNORMAL HIGH (ref 0.76–1.27)
Glucose: 155 mg/dL — ABNORMAL HIGH (ref 70–99)
Potassium: 4.9 mmol/L (ref 3.5–5.2)
Sodium: 140 mmol/L (ref 134–144)
eGFR: 44 mL/min/1.73 — ABNORMAL LOW (ref >59)

## 2024-05-20 LAB — CBC
Hematocrit: 43.8 % (ref 37.5–51.0)
Hemoglobin: 14.8 g/dL (ref 13.0–17.7)
MCH: 32.5 pg (ref 26.6–33.0)
MCHC: 33.8 g/dL (ref 31.5–35.7)
MCV: 96 fL (ref 79–97)
Platelets: 180 x10E3/uL (ref 150–450)
RBC: 4.56 x10E6/uL (ref 4.14–5.80)
RDW: 11.5 % — ABNORMAL LOW (ref 11.6–15.4)
WBC: 6.6 x10E3/uL (ref 3.4–10.8)

## 2024-05-24 ENCOUNTER — Telehealth: Payer: Self-pay

## 2024-05-24 ENCOUNTER — Ambulatory Visit: Payer: Self-pay | Admitting: Physician Assistant

## 2024-05-24 NOTE — Progress Notes (Deleted)
  Electrophysiology Office Follow up Visit Note:    Date:  05/24/2024   ID:  [name] Stanton Bouche Sr., DOB [birth year], MRN [id number]  PCP:  [name], MD  [facility] Cardiologist:  [name], MD  [facility] Electrophysiologist:  [name], MD    Interval History:     [name] Stanton Bouche Sr. is a 83 y.o. male who presents for a follow up visit.   The patient was last seen by [facility] May 19, 2024.  The patient has a history of coronary artery disease post CABG, hypertension, hyperlipidemia, diabetes, CKD 3 and reduced ejection fraction.  I last saw the patient Dec 17, 2022.  Patient has a history of SVT and PVCs.  Antiarrhythmic drugs were avoided given the presence of baseline conduction system disease.  The patient's atrial fibrillation was diagnosed at an appointment with [facility] on April 14, 2024.  This was the first diagnosis for him.  He was started on Eliquis  for stroke prophylaxis.  At the appointment with Lesley he was asymptomatic.  The patient was scheduled for cardioversion.  An echo was ordered.      Past medical, surgical, social and family history were reviewed.  ROS:   Please see the history of present illness.    All other systems reviewed and are negative.  EKGs/Labs/Other Studies Reviewed:    The following studies were reviewed today:  May 15, 2024 ZIO monitor PVC burden 5.1% 100% A-fib burden, average rate 63 bpm  May 19, 2024 EKG reviewed.  Atrial fibrillation with a right bundle branch block      Physical Exam:    VS:  There were no vitals taken for this visit.    Wt Readings from Last 3 Encounters:  05/19/24 162 lb 12.8 oz (73.8 kg)  05/10/24 163 lb (73.9 kg)  04/14/24 164 lb 2 oz (74.4 kg)     GEN: no distress CARD: RRR, No MRG RESP: No IWOB. CTAB.      ASSESSMENT:    No diagnosis found. PLAN:    In order of problems listed above:  #Persistent atrial  fibrillation Asymptomatic On Eliquis  for stroke prophylaxis  #Frequent PVCs Continue metoprolol  25 mg by mouth twice daily  I discussed my upcoming departure from [facility] during today's office visit.  He will follow-up with Dr. Kennyth moving forward.     Signed, Ole Holts, MD, [facility], [facility] 05/24/2024 3:06 PM    Electrophysiology [facility]

## 2024-05-24 NOTE — Telephone Encounter (Signed)
 Left message for patient to call back and reschedule appointment with Dr. Cindie until after his echocardiogram.

## 2024-05-25 ENCOUNTER — Ambulatory Visit: Admitting: Cardiology

## 2024-05-25 ENCOUNTER — Ambulatory Visit: Admitting: Certified Registered Nurse Anesthetist

## 2024-05-25 ENCOUNTER — Ambulatory Visit
Admission: RE | Admit: 2024-05-25 | Discharge: 2024-05-25 | Disposition: A | Discharge status: Home / Self Care | Attending: Cardiovascular Disease | Admitting: Cardiovascular Disease

## 2024-05-25 ENCOUNTER — Encounter: Payer: Self-pay | Admitting: Cardiovascular Disease

## 2024-05-25 ENCOUNTER — Other Ambulatory Visit: Payer: Self-pay

## 2024-05-25 ENCOUNTER — Encounter
Admission: RE | Disposition: A | Discharge status: Home / Self Care | Payer: Self-pay | Source: Home / Self Care | Attending: Cardiovascular Disease

## 2024-05-25 DIAGNOSIS — I480 Paroxysmal atrial fibrillation: Secondary | ICD-10-CM | POA: Diagnosis not present

## 2024-05-25 DIAGNOSIS — I4819 Other persistent atrial fibrillation: Secondary | ICD-10-CM | POA: Diagnosis not present

## 2024-05-25 DIAGNOSIS — E119 Type 2 diabetes mellitus without complications: Secondary | ICD-10-CM | POA: Diagnosis not present

## 2024-05-25 DIAGNOSIS — I130 Hypertensive heart and chronic kidney disease with heart failure and stage 1 through stage 4 chronic kidney disease, or unspecified chronic kidney disease: Secondary | ICD-10-CM | POA: Diagnosis not present

## 2024-05-25 DIAGNOSIS — I493 Ventricular premature depolarization: Secondary | ICD-10-CM

## 2024-05-25 DIAGNOSIS — Z7984 Long term (current) use of oral hypoglycemic drugs: Secondary | ICD-10-CM | POA: Diagnosis not present

## 2024-05-25 DIAGNOSIS — I5042 Chronic combined systolic (congestive) and diastolic (congestive) heart failure: Secondary | ICD-10-CM | POA: Diagnosis not present

## 2024-05-25 DIAGNOSIS — E1122 Type 2 diabetes mellitus with diabetic chronic kidney disease: Secondary | ICD-10-CM | POA: Diagnosis not present

## 2024-05-25 DIAGNOSIS — I5022 Chronic systolic (congestive) heart failure: Secondary | ICD-10-CM | POA: Diagnosis not present

## 2024-05-25 DIAGNOSIS — I132 Hypertensive heart and chronic kidney disease with heart failure and with stage 5 chronic kidney disease, or end stage renal disease: Secondary | ICD-10-CM | POA: Diagnosis not present

## 2024-05-25 DIAGNOSIS — N1831 Chronic kidney disease, stage 3a: Secondary | ICD-10-CM | POA: Diagnosis not present

## 2024-05-25 DIAGNOSIS — N183 Chronic kidney disease, stage 3 unspecified: Secondary | ICD-10-CM | POA: Insufficient documentation

## 2024-05-25 DIAGNOSIS — I4891 Unspecified atrial fibrillation: Secondary | ICD-10-CM | POA: Diagnosis not present

## 2024-05-25 DIAGNOSIS — Z951 Presence of aortocoronary bypass graft: Secondary | ICD-10-CM | POA: Insufficient documentation

## 2024-05-25 DIAGNOSIS — I2510 Atherosclerotic heart disease of native coronary artery without angina pectoris: Secondary | ICD-10-CM | POA: Diagnosis not present

## 2024-05-25 DIAGNOSIS — R0602 Shortness of breath: Secondary | ICD-10-CM

## 2024-05-25 DIAGNOSIS — K219 Gastro-esophageal reflux disease without esophagitis: Secondary | ICD-10-CM | POA: Insufficient documentation

## 2024-05-25 HISTORY — PX: CARDIOVERSION: SHX1299

## 2024-05-25 LAB — GLUCOSE, CAPILLARY: Glucose-Capillary: 176 mg/dL — ABNORMAL HIGH (ref 70–99)

## 2024-05-25 SURGERY — CARDIOVERSION
Anesthesia: General

## 2024-05-25 MED ORDER — PROPOFOL 10 MG/ML IV BOLUS
INTRAVENOUS | Status: AC
Start: 2024-05-25 — End: 2024-05-25
  Filled 2024-05-25: qty 40

## 2024-05-25 MED ORDER — FENTANYL CITRATE (PF) 100 MCG/2ML IJ SOLN: 25.0000 ug | INTRAMUSCULAR | Status: DC | PRN

## 2024-05-25 MED ORDER — SODIUM CHLORIDE 0.9 % IV SOLN: INTRAVENOUS | Status: DC

## 2024-05-25 MED ORDER — ONDANSETRON HCL 4 MG/2ML IJ SOLN: 4.0000 mg | Freq: Once | INTRAMUSCULAR | Status: DC | PRN

## 2024-05-25 MED ORDER — PROPOFOL 10 MG/ML IV BOLUS
INTRAVENOUS | Status: DC | PRN
  Administered 2024-05-25: 70 mg via INTRAVENOUS

## 2024-05-25 NOTE — CV Procedure (Signed)
Cardioversion procedure note For atrial fibrillation, persistent  Procedure Details:  Consent: Risks of procedure as well as the alternatives and risks of each were explained to the (patient/caregiver).  Consent for procedure obtained.  Time Out: Verified patient identification, verified procedure, site/side was marked, verified correct patient position, special equipment/implants available, medications/allergies/relevent history reviewed, required imaging and test results available.  Performed  Patient placed on cardiac monitor, pulse oximetry, supplemental oxygen as necessary.   Sedation given: propofol IV, Dr. [facility] Pacer pads placed anterior and posterior chest.   Cardioverted 1 time(s).   Cardioverted at  150 J. Synchronized biphasic Converted to NSR   Evaluation: Findings: Post procedure EKG shows: NSR Complications: None Patient did tolerate procedure well.  Time Spent Directly with the Patient:  39 minutes   Esmond Plants, M.D., Ph.D.

## 2024-05-25 NOTE — Anesthesia Postprocedure Evaluation (Signed)
 Anesthesia Post Note  Patient: [name] Stanton Bouche Sr.  Procedure(s) Performed: CARDIOVERSION  Patient location during evaluation: PACU Anesthesia Type: General Level of consciousness: awake Pain management: satisfactory to patient Vital Signs Assessment: post-procedure vital signs reviewed and stable Respiratory status: spontaneous breathing Cardiovascular status: stable Anesthetic complications: no   No notable events documented.   Last Vitals:  Vitals:   05/25/24 1243 05/25/24 1300  BP: (!) 118/55 (!) 139/59  Pulse: (!) 56 (!) 53  Resp: 18 16  Temp: (!) 36.4 C   SpO2: 99% 95%    Last Pain:  Vitals:   05/25/24 1300  TempSrc:   PainSc: 0-No pain                 VAN STAVEREN,Allix Blomquist

## 2024-05-25 NOTE — Transfer of Care (Signed)
 Immediate Anesthesia Transfer of Care Note  Patient: [name] Stanton Bouche Sr.  Procedure(s) Performed: CARDIOVERSION  Patient Location: specials room 14  Anesthesia Type:General  Level of Consciousness: drowsy  Airway & Oxygen Therapy: Patient Spontanous Breathing and Patient connected to nasal cannula oxygen  Post-op Assessment: Report given to RN and Post -op Vital signs reviewed and stable  Post vital signs: Reviewed and stable  Last Vitals:  Vitals Value Taken Time  BP    Temp    Pulse    Resp    SpO2      Last Pain:  Vitals:   05/25/24 1140  TempSrc: Temporal  PainSc: 0-No pain         Complications: No notable events documented.

## 2024-05-25 NOTE — Anesthesia Preprocedure Evaluation (Addendum)
 Anesthesia Evaluation  Patient identified by MRN, date of birth, ID band Patient awake    Reviewed: Allergy & Precautions, NPO status , Patient's Chart, lab work & pertinent test results  Airway Mallampati: II  TM Distance: >3 FB Neck ROM: Full    Dental  (+) Edentulous Upper, Edentulous Lower   Pulmonary neg pulmonary ROS   Pulmonary exam normal breath sounds clear to auscultation       Cardiovascular Exercise Tolerance: Good hypertension, Pt. on medications + CAD, + CABG and +CHF  Normal cardiovascular exam+ dysrhythmias Atrial Fibrillation  Rhythm:Irregular     Neuro/Psych negative neurological ROS  negative psych ROS   GI/Hepatic negative GI ROS, Neg liver ROS,GERD  Medicated,,  Endo/Other  diabetes, Type 2    Renal/GU CRFRenal disease  negative genitourinary   Musculoskeletal   Abdominal   Peds negative pediatric ROS (+)  Hematology negative hematology ROS (+) Blood dyscrasia, anemia   Anesthesia Other Findings Past Medical History: No date: Arthritis No date: Back pain     Comment:  hx No date: CAD (coronary artery disease)     Comment:  a. 2004 s/p CABG x 4 (LIMA->LAD, VG->Diag, VG->OM,               VG->RCA); b. 2007 Cath: VG->Diag occluded, otw 3/4 patent              grafts. No date: CKD (chronic kidney disease), stage II-III No date: Colon polyps No date: DM2 (diabetes mellitus, type 2) (HCC)     Comment:  stable No date: GERD (gastroesophageal reflux disease) No date: Glaucoma No date: Gout No date: HLD (hyperlipidemia) No date: HTN (hypertension) No date: Neuropathy  Past Surgical History: No date: BACK SURGERY     Comment:  Cyst removed 2004: CORONARY ARTERY BYPASS GRAFT 09/24/2021: CORONARY STENT INTERVENTION; N/A     Comment:  Procedure: CORONARY STENT INTERVENTION;  Surgeon: Mady Bruckner, MD;  Location: [facility] INVASIVE CV LAB;                Service: Cardiovascular;   Laterality: N/A; No date: KNEE SURGERY 09/24/2021: LEFT HEART CATH AND CORONARY ANGIOGRAPHY; Left     Comment:  Procedure: LEFT HEART CATH AND CORONARY ANGIOGRAPHY;                Surgeon: Mady Bruckner, MD;  Location: [facility] INVASIVE               CV LAB;  Service: Cardiovascular;  Laterality: Left;  BMI    Body Mass Index: 27.81 kg/m      Reproductive/Obstetrics negative OB ROS                              Anesthesia Physical Anesthesia Plan  ASA: 3  Anesthesia Plan: General   Post-op Pain Management:    Induction:   PONV Risk Score and Plan: Propofol infusion and TIVA  Airway Management Planned: Natural Airway and Nasal Cannula  Additional Equipment:   Intra-op Plan:   Post-operative Plan:   Informed Consent: I have reviewed the patients History and Physical, chart, labs and discussed the procedure including the risks, benefits and alternatives for the proposed anesthesia with the patient or authorized representative who has indicated his/her understanding and acceptance.     Dental Advisory Given  Plan Discussed with: CRNA  Anesthesia Plan Comments:  Anesthesia Quick Evaluation

## 2024-05-26 ENCOUNTER — Encounter: Payer: Self-pay | Admitting: Cardiovascular Disease

## 2024-05-29 NOTE — Interval H&P Note (Signed)
 History and Physical Interval Note:  05/29/2024 10:54 AM  [name] Stanton Bouche Sr.  has presented today for surgery, with the diagnosis of Cardioversion   Afib.  The various methods of treatment have been discussed with the patient and family. After consideration of risks, benefits and other options for treatment, the patient has consented to  Procedure(s): CARDIOVERSION (N/A) as a surgical intervention.  The patient's history has been reviewed, patient examined, no change in status, stable for surgery.  I have reviewed the patient's chart and labs.  Questions were answered to the patient's satisfaction.     [name]

## 2024-05-29 NOTE — H&P (Signed)

## 2024-05-31 ENCOUNTER — Ambulatory Visit: Admitting: Family Medicine

## 2024-05-31 ENCOUNTER — Encounter: Payer: Self-pay | Admitting: Family Medicine

## 2024-05-31 VITALS — BP 182/74 | HR 55 | Ht 64.0 in | Wt 164.6 lb

## 2024-05-31 DIAGNOSIS — I152 Hypertension secondary to endocrine disorders: Secondary | ICD-10-CM

## 2024-05-31 DIAGNOSIS — E785 Hyperlipidemia, unspecified: Secondary | ICD-10-CM

## 2024-05-31 DIAGNOSIS — I4819 Other persistent atrial fibrillation: Secondary | ICD-10-CM

## 2024-05-31 DIAGNOSIS — N1831 Chronic kidney disease, stage 3a: Secondary | ICD-10-CM | POA: Diagnosis not present

## 2024-05-31 DIAGNOSIS — E1159 Type 2 diabetes mellitus with other circulatory complications: Secondary | ICD-10-CM

## 2024-05-31 DIAGNOSIS — G629 Polyneuropathy, unspecified: Secondary | ICD-10-CM

## 2024-05-31 DIAGNOSIS — E1169 Type 2 diabetes mellitus with other specified complication: Secondary | ICD-10-CM | POA: Diagnosis not present

## 2024-05-31 DIAGNOSIS — Z7984 Long term (current) use of oral hypoglycemic drugs: Secondary | ICD-10-CM

## 2024-05-31 DIAGNOSIS — E1122 Type 2 diabetes mellitus with diabetic chronic kidney disease: Secondary | ICD-10-CM | POA: Diagnosis not present

## 2024-05-31 DIAGNOSIS — D649 Anemia, unspecified: Secondary | ICD-10-CM

## 2024-05-31 DIAGNOSIS — N183 Chronic kidney disease, stage 3 unspecified: Secondary | ICD-10-CM

## 2024-05-31 MED ORDER — FUROSEMIDE 20 MG PO TABS: 20.0000 mg | ORAL_TABLET | ORAL | Status: AC

## 2024-05-31 MED ORDER — METOPROLOL SUCCINATE ER 25 MG PO TB24: 25.0000 mg | ORAL_TABLET | Freq: Every day | ORAL | Status: AC

## 2024-05-31 NOTE — Patient Instructions (Signed)
 To keep you healthy, please keep in mind the following health maintenance items that you are due for:   Health Maintenance Due  Topic Date Due   OPHTHALMOLOGY EXAM  Never done   DTaP/Tdap/Td (1 - Tdap) Never done   Zoster Vaccines- Shingrix (1 of 2) Never done   Medicare Annual Wellness (AWV)  12/01/2023     Best Wishes,   Dr. Lang

## 2024-05-31 NOTE — Progress Notes (Signed)
 Established patient visit   Patient: [name] Bannan Sr.   DOB: 09/07/1940   83 y.o. Male  MRN: [id number] Visit Date: 05/31/2024  Today's healthcare provider: Rockie Agent, MD   Chief Complaint  Patient presents with   Medical Management of Chronic Issues    Patient presents for follow up with pcp reports doing well    Subjective     HPI     Medical Management of Chronic Issues    Additional comments: Patient presents for follow up with pcp reports doing well       Last edited by Cherry Chiquita HERO, CMA on 05/31/2024  8:35 AM.       Discussed the use of AI scribe software for clinical note transcription with the patient, who gave verbal consent to proceed.  History of Present Illness [name] Maahs Sr. is an 83 year old male with atrial fibrillation who presents for follow-up after recent cardioversion.  He recently underwent cardioversion for atrial fibrillation about a week ago and is currently feeling better with no new symptoms of shortness of breath. No recent falls and he is getting around well.  His blood pressure was significantly elevated at 189/77 this morning, but he had not taken his blood pressure medication yet. He attributes the elevated reading to stress, as he recently lost a brother to a heart attack the day after his cardioversion. He has lost three brothers in the last year and a half, which has been on his mind a lot.  Regarding his diabetes, his blood sugar has been well-controlled, typically under 130, but has been slightly elevated over the past three days despite no changes in his diet or routine. He is currently taking Rybelsus  14 mg, glipizide  10 mg daily, and Jardiance .  He follows with nephrology for CKD 3A but has not seen his nephrologist recently due to the doctor relocating. He is awaiting a new appointment with a different nephrologist. He recalls that his nephrologist told him his kidneys were 'looking good' at  his last visit.  His current medications include metoprolol  25 mg once daily, Ranexa  1000 mg twice daily, nitroglycerin  as needed for chest pain, Flomax  for urination, gabapentin  300 mg twice daily for nerve pain, Lasix  20 mg every other day or as needed for swelling, ferrous sulfate  for iron supplementation, atorvastatin  20 mg daily, Eliquis  5 mg twice daily for atrial fibrillation, and allopurinol  100 mg daily for gout.  He has had his eyes checked recently at [facility], and the doctor reported that everything was fine. He has not had a formal diabetes eye exam recently.     Past Medical History:  Diagnosis Date   Arthritis    Back pain    hx   CAD (coronary artery disease)    a. 2004 s/p CABG x 4 (LIMA->LAD, VG->Diag, VG->OM, VG->RCA); b. 2007 Cath: VG->Diag occluded, otw 3/4 patent grafts.   CKD (chronic kidney disease), stage II-III    Colon polyps    DM2 (diabetes mellitus, type 2) (HCC)    stable   GERD (gastroesophageal reflux disease)    Glaucoma    Gout    HLD (hyperlipidemia)    HTN (hypertension)    Neuropathy     Medications: Outpatient Medications Prior to Visit  Medication Sig   acetaminophen  (TYLENOL ) 650 MG CR tablet Take 650 mg by mouth every 8 (eight) hours as needed.   allopurinol  (ZYLOPRIM ) 100 MG tablet TAKE ONE TABLET (100 MG TOTAL) BY MOUTH  DAILY.   apixaban  (ELIQUIS ) 5 MG TABS tablet Take 1 tablet (5 mg total) by mouth 2 (two) times daily.   atorvastatin  (LIPITOR) 20 MG tablet Take 1 tablet (20 mg total) by mouth daily.   BD SHARPS CONTAINER HOME MISC Dispose of syringes after B12 injections   Blood Glucose Monitoring Suppl (ACCU-CHEK AVIVA PLUS) w/Device KIT To check blood sugar once daily   cetirizine (ZYRTEC) 10 MG tablet Take 10 mg by mouth daily. Take 1 tablet daily as needed for allergies   empagliflozin  (JARDIANCE ) 25 MG TABS tablet TAKE ONE TABLET (25 MG TOTAL) BY MOUTH DAILY.   ferrous sulfate  325 (65 FE) MG tablet Take 325 mg by mouth daily with  breakfast.   gabapentin  (NEURONTIN ) 300 MG capsule TAKE ONE CAPSULE BY MOUTH TWICE DAILY   glipiZIDE  (GLUCOTROL  XL) 10 MG 24 hr tablet TAKE ONE TABLET BY MOUTH EVERY MORNING WITH BREAKFAST   glucose blood (ACCU-CHEK GUIDE TEST) test strip USE AS DIRECTED TO CHECK FASTING BLOOD SUGAR EVERY MORNING   nitroGLYCERIN  (NITROSTAT ) 0.4 MG SL tablet Place 1 tablet (0.4 mg total) under the tongue every 5 (five) minutes as needed for chest pain.   ranolazine  (RANEXA ) 1000 MG SR tablet TAKE ONE TABLET BY MOUTH TWO TIMES DAILY   RYBELSUS  14 MG TABS TAKE 1 TABLET BY MOUTH EVERY DAY   tamsulosin  (FLOMAX ) 0.4 MG CAPS capsule TAKE ONE CAPSULE (0.4 MG TOTAL) BY MOUTH DAILY.   [DISCONTINUED] furosemide  (LASIX ) 20 MG tablet TAKE ONE TABLET BY MOUTH ONCE A DAY   [DISCONTINUED] metoprolol  succinate (TOPROL -XL) 25 MG 24 hr tablet Take 1 tablet (25 mg total) by mouth in the morning and at bedtime. (Patient taking differently: Take 25 mg by mouth daily.)   Facility-Administered Medications Prior to Visit  Medication Dose Route Frequency Provider   sodium chloride  flush (NS) 0.9 % injection 3 mL  3 mL Intravenous Q12H Furth, Cadence H, [name]    Review of Systems  Last metabolic panel Lab Results  Component Value Date   GLUCOSE 155 (H) 05/19/2024   NA 140 05/19/2024   K 4.9 05/19/2024   CL 104 05/19/2024   CO2 21 05/19/2024   BUN 18 05/19/2024   CREATININE 1.55 (H) 05/19/2024   EGFR 44 (L) 05/19/2024   CALCIUM  9.3 05/19/2024   PROT 5.9 (L) 06/16/2022   ALBUMIN 3.5 06/16/2022   LABGLOB 1.8 05/16/2021   AGRATIO 2.7 (H) 05/16/2021   BILITOT 0.6 06/16/2022   ALKPHOS 58 06/16/2022   AST 27 06/16/2022   ALT 52 (H) 06/16/2022   ANIONGAP 12 07/30/2022   Last lipids Lab Results  Component Value Date   CHOL 140 01/11/2024   HDL 34 (L) 01/11/2024   LDLCALC 60 01/11/2024   TRIG 291 (H) 01/11/2024   CHOLHDL 4.1 01/11/2024   Last hemoglobin A1c Lab Results  Component Value Date   HGBA1C 8.7 (H) 01/11/2024         Objective    BP (!) 182/74 (BP Location: Left Arm, Patient Position: Sitting, Cuff Size: Normal) Comment: manual  Pulse (!) 55   Ht 5' 4 (1.626 m)   Wt 164 lb 9.6 oz (74.7 kg)   SpO2 99%   BMI 28.25 kg/m  BP Readings from Last 3 Encounters:  05/31/24 (!) 182/74  05/25/24 134/68  05/19/24 (!) 143/70   Wt Readings from Last 3 Encounters:  05/31/24 164 lb 9.6 oz (74.7 kg)  05/25/24 162 lb (73.5 kg)  05/19/24 162 lb 12.8 oz (73.8 kg)  Physical Exam Vitals reviewed.  Constitutional:      General: He is not in acute distress.    Appearance: Normal appearance. He is not ill-appearing.  Cardiovascular:     Rate and Rhythm: Normal rate and regular rhythm.  Pulmonary:     Effort: Pulmonary effort is normal. No respiratory distress.     Breath sounds: No wheezing, rhonchi or rales.  Neurological:     Mental Status: He is alert and oriented to person, place, and time.  Psychiatric:        Mood and Affect: Mood normal.        Behavior: Behavior normal.       No results found for any visits on 05/31/24.  Assessment & Plan     Problem List Items Addressed This Visit     Anemia   CKD stage 3 due to type 2 diabetes mellitus (HCC)   Hyperlipidemia associated with type 2 diabetes mellitus (HCC) - Primary   Relevant Medications   metoprolol  succinate (TOPROL -XL) 25 MG 24 hr tablet   furosemide  (LASIX ) 20 MG tablet   Hypertension associated with diabetes (HCC)   Relevant Medications   metoprolol  succinate (TOPROL -XL) 25 MG 24 hr tablet   furosemide  (LASIX ) 20 MG tablet   Other Relevant Orders   BMP8+EGFR   Neuropathy   Persistent atrial fibrillation (HCC)   Relevant Medications   metoprolol  succinate (TOPROL -XL) 25 MG 24 hr tablet   furosemide  (LASIX ) 20 MG tablet   Other Relevant Orders   CBC   Stage 3a chronic kidney disease (HCC)   Type 2 diabetes mellitus with hyperlipidemia (HCC)   Relevant Medications   metoprolol  succinate (TOPROL -XL) 25 MG 24  hr tablet   furosemide  (LASIX ) 20 MG tablet   Other Relevant Orders   Hemoglobin A1c    Assessment and Plan Assessment & Plan Type 2 diabetes mellitus Blood sugar levels have been well-controlled with recent readings under 130 mg/dL for the past six months. However, there has been an increase in blood sugar levels over the last three days, with readings over 130 mg/dL despite maintaining the same dietary habits. - Ordered A1c test to assess long-term glucose control - Continue Rybelsus  14 mg for diabetes management - Continue glipizide  10 mg daily  Hypertension Chronic  Blood pressure was significantly elevated at 189/77 mmHg this morning, likely due to stress from recent family bereavement and not taking blood pressure medication. Blood pressure improved to 140/82 mmHg after recheck. - Rechecked blood pressure - continue BP medications (lasix  20mg  every other day PRN for edema, metoprolol  25mg  daily) Follow up with cardiology    Atrial fibrillation, persistent, status post recent cardioversion Recent cardioversion was performed approximately one week ago. He reports feeling better with no shortness of breath and is in good rhythm. - Continue Eliquis  5 mg twice daily for anticoagulation - Continue metoprolol  25 mg daily - Continue Ranexa  1000 mg twice daily - Use nitroglycerin  as needed for chest pain Follow up with cardiology    Chronic kidney disease, stage 3a He is under the care of nephrology for CKD stage 3a. Reports that his kidneys are holding strong. - Ordered basic metabolic panel and CBC with iron to monitor kidney function Follow up with nephrology as scheduled    Gout Managed with allopurinol . - Continue allopurinol  100 mg daily  Benign prostatic hyperplasia with lower urinary tract symptoms Managed with Flomax . - Continue Flomax  for urinary symptoms  Iron deficiency anemia on supplementation Iron deficiency anemia is being  managed with ferrous sulfate   supplementation. - Ordered CBC with iron to assess iron levels - Continue ferrous sulfate  supplementation  Hyperlipidemia Managed with atorvastatin . - Continue atorvastatin  20 mg daily  Neuropathic pain Managed with gabapentin . - Continue gabapentin  300 mg twice daily  General Health Maintenance He is due for a tetanus booster and consideration for a shingles vaccine. - Consider shingles vaccine     Return in about 3 months (around 08/31/2024) for AWV.         Rockie Agent, MD  [facility] [phone] (phone) [phone] (fax)  [facility]

## 2024-06-01 ENCOUNTER — Ambulatory Visit: Payer: Self-pay | Admitting: Family Medicine

## 2024-06-01 LAB — BMP8+EGFR
BUN/Creatinine Ratio: 13 (ref 10–24)
BUN: 15 mg/dL (ref 8–27)
CO2: 23 mmol/L (ref 20–29)
Calcium: 9.6 mg/dL (ref 8.6–10.2)
Chloride: 103 mmol/L (ref 96–106)
Creatinine, Ser: 1.2 mg/dL (ref 0.76–1.27)
Glucose: 223 mg/dL — ABNORMAL HIGH (ref 70–99)
Potassium: 5.2 mmol/L (ref 3.5–5.2)
Sodium: 138 mmol/L (ref 134–144)
eGFR: 60 mL/min/1.73 (ref >59)

## 2024-06-01 LAB — CBC
Hematocrit: 45.3 % (ref 37.5–51.0)
Hemoglobin: 14.9 g/dL (ref 13.0–17.7)
MCH: 32.1 pg (ref 26.6–33.0)
MCHC: 32.9 g/dL (ref 31.5–35.7)
MCV: 98 fL — ABNORMAL HIGH (ref 79–97)
Platelets: 168 x10E3/uL (ref 150–450)
RBC: 4.64 x10E6/uL (ref 4.14–5.80)
RDW: 11.3 % — ABNORMAL LOW (ref 11.6–15.4)
WBC: 5.9 x10E3/uL (ref 3.4–10.8)

## 2024-06-01 LAB — HEMOGLOBIN A1C
Est. average glucose Bld gHb Est-mCnc: 154 mg/dL
Hgb A1c MFr Bld: 7 % — ABNORMAL HIGH (ref 4.8–5.6)

## 2024-06-06 ENCOUNTER — Ambulatory Visit: Attending: Physician Assistant

## 2024-06-08 ENCOUNTER — Telehealth: Payer: Self-pay | Admitting: Physician Assistant

## 2024-06-08 NOTE — Telephone Encounter (Signed)
 LVM to reschedule 11/17 appt after echo appt needed

## 2024-06-08 NOTE — Telephone Encounter (Signed)
-----   Message from Nurse Jon R sent at 06/08/2024  8:26 AM EST ----- Regarding: ECHO Please reschedule ECHO, fu with Mariah on 11/17 may need to be rescheduled

## 2024-06-13 ENCOUNTER — Ambulatory Visit: Admitting: Physician Assistant

## 2024-06-14 ENCOUNTER — Ambulatory Visit: Attending: Physician Assistant

## 2024-06-14 NOTE — Progress Notes (Unsigned)
 Cardiology Office Note    Date:  06/16/2024   ID:  [name] Stanton Bouche Sr., DOB 1941-07-10, MRN [id number]  PCP:  [name], MD  Cardiologist:  [name], MD  Electrophysiologist:  [name], MD   Chief Complaint: Follow up  History of Present Illness:   [name] Gilham Morson Sr. is a 83 y.o. 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    Patient has a history of CABG x 4 in 2004 with subsequent catheterization in 2007 revealing 3 out of 4 patent grafts (occluded vein graft to the diagonal).  Myoview  07/2019 was abnormal and high risk with possible ischemia, LVEF 43%.  Echo 07/2019 showed EF 50 to 55%, G2 DD, WMA, mildly dilated LA, and moderately elevated pulmonary artery pressure.  Patient was without symptoms of angina at that time and no further ischemic evaluation was pursued.  Seen 08/2021 reporting exertional chest pain.  Lexiscan  Myoview  showed ischemia in the inferior wall with perfusion defect, moderate reversible in the inferior wall and apical region with EF 43%, moderate risk scan.  Cardiac catheterization 08/2021 revealed multivessel CAD including severe diffuse LAD and left circumflex disease.  Short chronic total occlusion of the mid RCA with bridging and left-to-right collaterals was noted.  Widely patent LIMA to LAD and sequential SVG to OM and OM 2.  Chronically occluded SVG to D1.  Normal LVEF with normal filling pressures.  Unsuccessful attempted PCI to the mid RCA due to inability to cross the lesion using run-through and Fielder XT wires.  Recommendation for escalation of antianginal therapy with Imdur  increased to 120 mg daily.  Saw Dr. Jackolyn 02/2022 and CTO PCI was felt to be high risk with activity modification recommended.  He was admitted 05/2022 with heart failure exacerbation.  Echo showed EF  45 to 50%, RV mildly reduced.  He was diuresed 8 L with IV Lasix .  He was transition to oral Lasix .  GDMT limited by CKD.  Discharge weight was 166 pounds.  Admitted again 06/2022 with hyperkalemia, K greater than 7.5.  He had been taking potassium 20 mill equivalents twice daily rather than his prescribed daily dose.  He was also treated for AKI during this stay with creatinine up to 2.2.  Seen in follow-up 08/2022 reporting dizziness with positive orthostatics.  Coreg  was discontinued and Lasix  dose was reduced.  2-week heart monitor was ordered and revealed 164 episodes of SVT the longest of which lasting 5 minutes and 44 seconds.  Also noted to have 2.5% burden of PACs and 11% burden of PVCs.  Patient was referred to EP and seen by Dr. Holts 11/2022.  He was started on metoprolol  succinate 25 mg twice daily.  He was not felt to be a candidate for EP study or ablation given likely etiology of arrhythmia being atrial tachycardia.  Patient was seen in follow-up 04/14/2024 and noted to be in new atrial fibrillation which was rate controlled on his current dose of metoprolol .  He was asymptomatic to the arrhythmia.  He was started on Eliquis  5 mg twice daily for CHA2DS2-VASc of 6.  ZIO monitor was ordered and showed 100% A-fib burden with an average rate of 63 bpm.  He was seen again in follow-up 05/10/2024 with EKG showing atrial fibrillation.  However, this was not addressed at visit.   Patient was most recently seen by myself  05/19/2024 overall doing well from a cardiac perspective.  He remained in atrial fibrillation although he was asymptomatic to this.  It was recommended that normal sinus rhythm attempt to be restored with DCCV.  He underwent successful DCCV 05/19/2024.  Patient presents today overall doing well from a cardiac perspective.  He does not feel much difference since restoration of sinus rhythm.  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  The bag of feed fell on him  and he hit his back on the trailer.  He denies hitting his head.  His back is a little sore but he overall feels okay.  He denies chest pain, shortness of breath, lightheadedness, dizziness, and lower extremity swelling.  He denies bleeding and hematochezia.  Labs independently reviewed: 05/2024-Hgb 14.9, HCT 45.3, platelets 168, BUN 15, creatinine 1.2, sodium 138, potassium 5.2 12/2023-TC 140, TG 291, HDL 34, LDL 60  Objective   Past Medical History:  Diagnosis Date   Arthritis    Back pain    hx   CAD (coronary artery disease)    a. 2004 s/p CABG x 4 (LIMA->LAD, VG->Diag, VG->OM, VG->RCA); b. 2007 Cath: VG->Diag occluded, otw 3/4 patent grafts.   CKD (chronic kidney disease), stage II-III    Colon polyps    DM2 (diabetes mellitus, type 2) (HCC)    stable   GERD (gastroesophageal reflux disease)    Glaucoma    Gout    HLD (hyperlipidemia)    HTN (hypertension)    Neuropathy     Current Medications: Current Meds  Medication Sig   acetaminophen  (TYLENOL ) 650 MG CR tablet Take 650 mg by mouth every 8 (eight) hours as needed.   allopurinol  (ZYLOPRIM ) 100 MG tablet TAKE ONE TABLET (100 MG TOTAL) BY MOUTH DAILY.   apixaban  (ELIQUIS ) 5 MG TABS tablet Take 1 tablet (5 mg total) by mouth 2 (two) times daily.   atorvastatin  (LIPITOR) 20 MG tablet Take 1 tablet (20 mg total) by mouth daily.   BD SHARPS CONTAINER HOME MISC Dispose of syringes after B12 injections   Blood Glucose Monitoring Suppl (ACCU-CHEK AVIVA PLUS) w/Device KIT To check blood sugar once daily   cetirizine (ZYRTEC) 10 MG tablet Take 10 mg by mouth daily. Take 1 tablet daily as needed for allergies   empagliflozin  (JARDIANCE ) 25 MG TABS tablet TAKE ONE TABLET (25 MG TOTAL) BY MOUTH DAILY.   ferrous sulfate  325 (65 FE) MG tablet Take 325 mg by mouth daily with breakfast.   furosemide  (LASIX ) 20 MG tablet Take 1 tablet (20 mg total) by mouth every other day.   gabapentin  (NEURONTIN ) 300 MG capsule TAKE ONE CAPSULE BY MOUTH  TWICE DAILY   glipiZIDE  (GLUCOTROL  XL) 10 MG 24 hr tablet TAKE ONE TABLET BY MOUTH EVERY MORNING WITH BREAKFAST   glucose blood (ACCU-CHEK GUIDE TEST) test strip USE AS DIRECTED TO CHECK FASTING BLOOD SUGAR EVERY MORNING   metoprolol  succinate (TOPROL -XL) 25 MG 24 hr tablet Take 1 tablet (25 mg total) by mouth daily.   nitroGLYCERIN  (NITROSTAT ) 0.4 MG SL tablet Place 1 tablet (0.4 mg total) under the tongue every 5 (five) minutes as needed for chest pain.   ranolazine  (RANEXA ) 1000 MG SR tablet TAKE ONE TABLET BY MOUTH TWO TIMES DAILY   RYBELSUS  14 MG TABS TAKE 1 TABLET BY MOUTH EVERY DAY   tamsulosin  (FLOMAX ) 0.4 MG CAPS capsule TAKE ONE CAPSULE (0.4 MG TOTAL) BY MOUTH DAILY.   Current Facility-Administered Medications for the 06/16/24 encounter (  Office Visit) with Lorene Lesley CROME, [name]  Medication   sodium chloride  flush (NS) 0.9 % injection 3 mL    Allergies:   Ramipril   Social History   Socioeconomic History   Marital status: Widowed    Spouse name: Not on file   Number of children: 3   Years of education: Not on file   Highest education level: 8th grade  Occupational History   Occupation: Retired  Tobacco Use   Smoking status: Never   Smokeless tobacco: Current    Types: Chew   Tobacco comments:    chew tobacco - 1 pack last 3 weeks  Vaping Use   Vaping status: Never Used  Substance and Sexual Activity   Alcohol use: No    Alcohol/week: 0.0 standard drinks of alcohol   Drug use: No   Sexual activity: Not Currently  Other Topics Concern   Not on file  Social History Narrative   Not on file   Social Drivers of Health   Financial Resource Strain: Low Risk  (05/17/2024)   Received from [facility]   Overall Financial Resource Strain (CARDIA)    Difficulty of Paying Living Expenses: Not hard at all  Food Insecurity: No Food Insecurity (05/17/2024)   Received from [facility]   Hunger Vital Sign    Within the past 12 months, you  worried that your food would run out before you got the money to buy more.: Never true    Within the past 12 months, the food you bought just didn't last and you didn't have money to get more.: Never true  Transportation Needs: No Transportation Needs (05/17/2024)   Received from [facility] - Transportation    In the past 12 months, has lack of transportation kept you from medical appointments or from getting medications?: No    Lack of Transportation (Non-Medical): No  Physical Activity: Inactive (12/01/2022)   Exercise Vital Sign    Days of Exercise per Week: 0 days    Minutes of Exercise per Session: 0 min  Stress: No Stress Concern Present (09/24/2023)   [employer] - Occupational Stress Questionnaire    Feeling of Stress : Not at all  Social Connections: Unknown (12/01/2022)   Social Connection and Isolation Panel    Frequency of Communication with Friends and Family: More than three times a week    Frequency of Social Gatherings with Friends and Family: More than three times a week    Attends Religious Services: Not on file    Active Member of Clubs or Organizations: No    Attends Banker Meetings: Never    Marital Status: Widowed     Family History:  The patient's family history includes Stroke in his mother.  ROS:   12-point review of systems is negative unless otherwise noted in the HPI.  EKGs/Other Studies Reviewed:    Studies reviewed were summarized above. The additional studies were reviewed today:  05/15/2024 Long term monitor Atrial Fibrillation occurred continuously (100% burden), ranging from 41-119 bpm (avg of 63 bpm).  Isolated VEs were frequent (5.1%, 47503), VE Couplets were rare (<1.0%, 186), and VE Triplets were rare  (<1.0%, 1).  EKG:  EKG personally reviewed by me today EKG Interpretation Date/Time:  Thursday June 16 2024 11:44:37 EST Ventricular Rate:  61 PR Interval:  172 QRS  Duration:  146 QT Interval:  472 QTC Calculation: 475 R Axis:  85  Text Interpretation: Normal sinus rhythm Right bundle branch block Confirmed by Lorene Sinclair ([id number]) on 06/16/2024 11:49:20 AM  PHYSICAL EXAM:    VS:  BP 122/60 (BP Location: Left Arm, Patient Position: Sitting, Cuff Size: Normal)   Pulse 61   Ht 5' 7 (1.702 m)   Wt 164 lb 9.6 oz (74.7 kg)   SpO2 96%   BMI 25.78 kg/m   BMI: Body mass index is 25.78 kg/m.  GEN: Well nourished, well developed in no acute distress NECK: No JVD; No carotid bruits CARDIAC: RRR, no murmurs, rubs, gallops RESPIRATORY:  Clear to auscultation without rales, wheezing or rhonchi  ABDOMEN: Soft, non-tender, non-distended EXTREMITIES: No edema; No deformity  Wt Readings from Last 3 Encounters:  06/16/24 164 lb 9.6 oz (74.7 kg)  05/31/24 164 lb 9.6 oz (74.7 kg)  05/25/24 162 lb (73.5 kg)                  ASSESSMENT & PLAN:   Persistent atrial fibrillation - S/p successful cardioversion 05/25/2024.  He is maintaining sinus rhythm today on EKG.  He is continued on Eliquis  5 mg twice daily for CHA2DS2-VASc 6.  Denies bleeding and hematochezia.  He is continued on metoprolol  succinate 25 mg twice daily.  Recommend updating echo now that he is back in sinus.  Coronary artery disease - LHC 08/2021 with severe three-vessel CAD and unsuccessful PCI attempt to the RCA.  Seen by CTO clinic in [facility] with medical management recommended.  Continues to stay very active without symptoms of angina.  No further testing at this time.  He is on Eliquis  in place of aspirin .  He is continued on atorvastatin  20 mg daily, metoprolol  succinate 25 mg twice daily, and Ranexa  1000 mg twice daily  HFmrEF - Most recent echo 05/2022 with EF 45 to 50%.  He did not tolerate carvedilol  in the past.  Appears euvolemic on exam today.  He is continued on Jardiance  25 mg daily, Lasix  20 mg every other day, and metoprolol  succinate 25 mg twice daily.  Consider  addition of ARB at follow-up.  Updated echo as above.  PSVT PVCs - Asymptomatic.  Followed by EP.  Continued on metoprolol  as above.  Hypertension - BP well-controlled on current regimen.  Hyperlipidemia - Most recent lipid panel 12/2023 with LDL 60.  He is continued on atorvastatin  20 mg daily.     Disposition: Update echo.  F/u with Dr. Gollan or an APP in 3 to 4 months.   Medication Adjustments/Labs and Tests Ordered: Current medicines are reviewed at length with the patient today.  Concerns regarding medicines are outlined above. Medication changes, Labs and Tests ordered today are summarized above and listed in the Patient Instructions accessible in Encounters.   Bonney Sinclair Lorene, [name] 06/16/2024 12:52 PM     [facility] [place] [address] [place], [place] [address] [phone]

## 2024-06-16 ENCOUNTER — Encounter: Payer: Self-pay | Admitting: Physician Assistant

## 2024-06-16 ENCOUNTER — Ambulatory Visit: Attending: Physician Assistant | Admitting: Physician Assistant

## 2024-06-16 VITALS — BP 122/60 | HR 61 | Ht 67.0 in | Wt 164.6 lb

## 2024-06-16 DIAGNOSIS — E785 Hyperlipidemia, unspecified: Secondary | ICD-10-CM | POA: Diagnosis not present

## 2024-06-16 DIAGNOSIS — I10 Essential (primary) hypertension: Secondary | ICD-10-CM

## 2024-06-16 DIAGNOSIS — I5020 Unspecified systolic (congestive) heart failure: Secondary | ICD-10-CM

## 2024-06-16 DIAGNOSIS — I471 Supraventricular tachycardia, unspecified: Secondary | ICD-10-CM

## 2024-06-16 DIAGNOSIS — I4819 Other persistent atrial fibrillation: Secondary | ICD-10-CM | POA: Diagnosis not present

## 2024-06-16 DIAGNOSIS — I493 Ventricular premature depolarization: Secondary | ICD-10-CM | POA: Diagnosis not present

## 2024-06-16 DIAGNOSIS — I25118 Atherosclerotic heart disease of native coronary artery with other forms of angina pectoris: Secondary | ICD-10-CM | POA: Diagnosis not present

## 2024-06-16 NOTE — Patient Instructions (Signed)
 Medication Instructions:  Your physician recommends that you continue on your current medications as directed. Please refer to the Current Medication list given to you today.  *If you need a refill on your cardiac medications before your next appointment, please call your pharmacy*  Lab Work: No labs ordered today  If you have labs (blood work) drawn today and your tests are completely normal, you will receive your results only by: MyChart Message (if you have MyChart) OR A paper copy in the mail If you have any lab test that is abnormal or we need to change your treatment, we will call you to review the results.  Testing/Procedures: Your physician has requested that you have an echocardiogram. Echocardiography is a painless test that uses sound waves to create images of your heart. It provides your doctor with information about the size and shape of your heart and how well your heart's chambers and valves are working.   You may receive an ultrasound enhancing agent through an IV if needed to better visualize your heart during the echo. This procedure takes approximately one hour.  There are no restrictions for this procedure.  This will take place at [address] [facility] [address] [facility], [place] [address]  Please note: We ask at that you not bring children with you during ultrasound (echo/ vascular) testing. Due to room size and safety concerns, children are not allowed in the ultrasound rooms during exams. Our front office staff cannot provide observation of children in our lobby area while testing is being conducted. An adult accompanying a patient to their appointment will only be allowed in the ultrasound room at the discretion of the ultrasound technician under special circumstances. We apologize for any inconvenience.   Follow-Up: At [facility], you and your health needs are our priority.  As part of our continuing mission to provide you with exceptional heart  care, our providers are all part of one team.  This team includes your primary Cardiologist (physician) and Advanced Practice Providers or APPs (Physician Assistants and Nurse Practitioners) who all work together to provide you with the care you need, when you need it.  Your next appointment:   3-4 month(s)  Provider:   You will see one of the following Advanced Practice Providers on your designated Care Team:   Lonni Meager, NP Lesley Maffucci, [name] Bernardino Bring, [name] Cadence [place], [name] Tylene Lunch, NP Barnie Hila, NP      We recommend signing up for the patient portal called MyChart.  Sign up information is provided on this After Visit Summary.  MyChart is used to connect with patients for Virtual Visits (Telemedicine).  Patients are able to view lab/test results, encounter notes, upcoming appointments, etc.  Non-urgent messages can be sent to your provider as well.   To learn more about what you can do with MyChart, go to [email or web address].

## 2024-06-19 ENCOUNTER — Other Ambulatory Visit: Payer: Self-pay

## 2024-06-19 ENCOUNTER — Emergency Department (HOSPITAL_COMMUNITY)
Admission: EM | Admit: 2024-06-19 | Discharge: 2024-06-19 | Disposition: A | Discharge status: Home / Self Care | Attending: Emergency Medicine | Admitting: Emergency Medicine

## 2024-06-19 ENCOUNTER — Emergency Department (HOSPITAL_COMMUNITY)

## 2024-06-19 DIAGNOSIS — S2242XA Multiple fractures of ribs, left side, initial encounter for closed fracture: Secondary | ICD-10-CM | POA: Diagnosis not present

## 2024-06-19 DIAGNOSIS — Y92002 Bathroom of unspecified non-institutional (private) residence single-family (private) house as the place of occurrence of the external cause: Secondary | ICD-10-CM | POA: Insufficient documentation

## 2024-06-19 DIAGNOSIS — I739 Peripheral vascular disease, unspecified: Secondary | ICD-10-CM | POA: Insufficient documentation

## 2024-06-19 DIAGNOSIS — Z7901 Long term (current) use of anticoagulants: Secondary | ICD-10-CM | POA: Insufficient documentation

## 2024-06-19 DIAGNOSIS — W010XXA Fall on same level from slipping, tripping and stumbling without subsequent striking against object, initial encounter: Secondary | ICD-10-CM | POA: Insufficient documentation

## 2024-06-19 DIAGNOSIS — I6529 Occlusion and stenosis of unspecified carotid artery: Secondary | ICD-10-CM | POA: Diagnosis not present

## 2024-06-19 DIAGNOSIS — I700 Atherosclerosis of aorta: Secondary | ICD-10-CM | POA: Insufficient documentation

## 2024-06-19 DIAGNOSIS — M25461 Effusion, right knee: Secondary | ICD-10-CM | POA: Insufficient documentation

## 2024-06-19 DIAGNOSIS — S0990XA Unspecified injury of head, initial encounter: Secondary | ICD-10-CM | POA: Diagnosis present

## 2024-06-19 DIAGNOSIS — S51011A Laceration without foreign body of right elbow, initial encounter: Secondary | ICD-10-CM | POA: Insufficient documentation

## 2024-06-19 DIAGNOSIS — S8991XA Unspecified injury of right lower leg, initial encounter: Secondary | ICD-10-CM | POA: Diagnosis not present

## 2024-06-19 DIAGNOSIS — N400 Enlarged prostate without lower urinary tract symptoms: Secondary | ICD-10-CM | POA: Insufficient documentation

## 2024-06-19 DIAGNOSIS — S51012A Laceration without foreign body of left elbow, initial encounter: Secondary | ICD-10-CM | POA: Diagnosis not present

## 2024-06-19 DIAGNOSIS — M47812 Spondylosis without myelopathy or radiculopathy, cervical region: Secondary | ICD-10-CM | POA: Insufficient documentation

## 2024-06-19 DIAGNOSIS — R935 Abnormal findings on diagnostic imaging of other abdominal regions, including retroperitoneum: Secondary | ICD-10-CM | POA: Diagnosis not present

## 2024-06-19 DIAGNOSIS — R0789 Other chest pain: Secondary | ICD-10-CM | POA: Diagnosis present

## 2024-06-19 DIAGNOSIS — Z043 Encounter for examination and observation following other accident: Secondary | ICD-10-CM | POA: Diagnosis not present

## 2024-06-19 DIAGNOSIS — M1711 Unilateral primary osteoarthritis, right knee: Secondary | ICD-10-CM | POA: Diagnosis not present

## 2024-06-19 DIAGNOSIS — S299XXA Unspecified injury of thorax, initial encounter: Secondary | ICD-10-CM | POA: Diagnosis not present

## 2024-06-19 DIAGNOSIS — M4802 Spinal stenosis, cervical region: Secondary | ICD-10-CM | POA: Diagnosis not present

## 2024-06-19 LAB — CBC WITH DIFFERENTIAL/PLATELET
Abs Immature Granulocytes: 0.03 K/uL (ref 0.00–0.07)
Basophils Absolute: 0.1 K/uL (ref 0.0–0.1)
Basophils Relative: 1 %
Eosinophils Absolute: 0.1 K/uL (ref 0.0–0.5)
Eosinophils Relative: 2 %
HCT: 43.5 % (ref 39.0–52.0)
Hemoglobin: 14.2 g/dL (ref 13.0–17.0)
Immature Granulocytes: 0 %
Lymphocytes Relative: 41 %
Lymphs Abs: 2.9 K/uL (ref 0.7–4.0)
MCH: 31.6 pg (ref 26.0–34.0)
MCHC: 32.6 g/dL (ref 30.0–36.0)
MCV: 96.7 fL (ref 80.0–100.0)
Monocytes Absolute: 0.5 K/uL (ref 0.1–1.0)
Monocytes Relative: 6 %
Neutro Abs: 3.5 K/uL (ref 1.7–7.7)
Neutrophils Relative %: 50 %
Platelets: 191 K/uL (ref 150–400)
RBC: 4.5 MIL/uL (ref 4.22–5.81)
RDW: 12.5 % (ref 11.5–15.5)
WBC: 7.1 K/uL (ref 4.0–10.5)
nRBC: 0 % (ref 0.0–0.2)

## 2024-06-19 LAB — BASIC METABOLIC PANEL WITH GFR
Anion gap: 5 (ref 5–15)
BUN: 19 mg/dL (ref 8–23)
CO2: 28 mmol/L (ref 22–32)
Calcium: 9 mg/dL (ref 8.9–10.3)
Chloride: 108 mmol/L (ref 98–111)
Creatinine, Ser: 1.7 mg/dL — ABNORMAL HIGH (ref 0.61–1.24)
GFR, Estimated: 40 mL/min — ABNORMAL LOW (ref >60)
Glucose, Bld: 164 mg/dL — ABNORMAL HIGH (ref 70–99)
Potassium: 4.1 mmol/L (ref 3.5–5.1)
Sodium: 141 mmol/L (ref 135–145)

## 2024-06-19 LAB — TROPONIN I (HIGH SENSITIVITY)
Troponin I (High Sensitivity): 6 ng/L (ref <18)
Troponin I (High Sensitivity): 7 ng/L (ref <18)

## 2024-06-19 MED ORDER — FENTANYL CITRATE (PF) 50 MCG/ML IJ SOSY
12.5000 ug | PREFILLED_SYRINGE | Freq: Once | INTRAMUSCULAR | Status: AC
  Administered 2024-06-19: 12.5 ug via INTRAVENOUS
  Filled 2024-06-19: qty 1

## 2024-06-19 MED ORDER — OXYCODONE-ACETAMINOPHEN 5-325 MG PO TABS: 1.0000 | ORAL_TABLET | Freq: Four times a day (QID) | ORAL | 0 refills | Status: DC | PRN

## 2024-06-19 MED ORDER — OXYCODONE-ACETAMINOPHEN 5-325 MG PO TABS
1.0000 | ORAL_TABLET | Freq: Once | ORAL | Status: AC
  Administered 2024-06-19: 1 via ORAL
  Filled 2024-06-19: qty 1

## 2024-06-19 NOTE — ED Notes (Addendum)
 Patient transported to CT

## 2024-06-19 NOTE — ED Notes (Signed)
 Pt ambulated in hallway with assistance. Pt had steady gait and no complaints.

## 2024-06-19 NOTE — ED Triage Notes (Signed)
 Pt got up to go to bathroom and fell and hit wall. Pt has pain in chest, abdomen and has abrasion on head and bilateral elbows. Pt unsure if he lost consciousness.

## 2024-06-19 NOTE — ED Notes (Signed)
 Trauma Response Nurse Documentation   [name] Loewen Sr. is a 83 y.o. male arriving to [facility] ED via POV  On Eliquis  (apixaban ) daily. Trauma was activated as a Level 2 by Almarie Naomi PEAK based on the following trauma criteria Elderly patients > 65 with head trauma on anti-coagulation (excluding ASA).  Patient cleared for CT by Dr. Haze. Pt transported to CT with trauma response nurse present to monitor. RN remained with the patient throughout their absence from the department for clinical observation.   GCS 15.  Trauma MD Arrival Time: N/A.  History   Past Medical History:  Diagnosis Date   Arthritis    Back pain    hx   CAD (coronary artery disease)    a. 2004 s/p CABG x 4 (LIMA->LAD, VG->Diag, VG->OM, VG->RCA); b. 2007 Cath: VG->Diag occluded, otw 3/4 patent grafts.   CKD (chronic kidney disease), stage II-III    Colon polyps    DM2 (diabetes mellitus, type 2) (HCC)    stable   GERD (gastroesophageal reflux disease)    Glaucoma    Gout    HLD (hyperlipidemia)    HTN (hypertension)    Neuropathy      Past Surgical History:  Procedure Laterality Date   BACK SURGERY     Cyst removed   CARDIOVERSION N/A 05/25/2024   Procedure: CARDIOVERSION;  Surgeon: Perla Evalene PARAS, MD;  Location: [facility] ORS;  Service: Cardiovascular;  Laterality: N/A;   CORONARY ARTERY BYPASS GRAFT  2004   CORONARY STENT INTERVENTION N/A 09/24/2021   Procedure: CORONARY STENT INTERVENTION;  Surgeon: Mady Bruckner, MD;  Location: [facility] INVASIVE CV LAB;  Service: Cardiovascular;  Laterality: N/A;   KNEE SURGERY     LEFT HEART CATH AND CORONARY ANGIOGRAPHY Left 09/24/2021   Procedure: LEFT HEART CATH AND CORONARY ANGIOGRAPHY;  Surgeon: Mady Bruckner, MD;  Location: [facility] INVASIVE CV LAB;  Service: Cardiovascular;  Laterality: Left;       Initial Focused Assessment (If applicable, or please see trauma documentation): Airway-- intact, no visible obstruction Breathing-- spontaneous,  unlabored Circulation-- no obvious bleeding noted  CT's Completed:   CT Head and CT C-Spine, CT chest/abdomen/pelvis wo contrast  Interventions:  See event summary  Plan for disposition:    Consults completed:    Event Summary: Patient arrived POV from home. Patient got up to use restroom and had mechanical fall. Patient struck the wall and a dresser. Patient complaining of left sided chest pain, right knee pain, patient also has an abrasion to top of his head. Manual BP obtained. 20 G PIV RFA initiated. Xray chest and right knee completed. Patient to CT with TRN, primary RN. CT head, c-spine, chest/abdomen/pelvis wo completed. Patient back to trauma bay at this time.  MTP Summary (If applicable):  N/A  Bedside handoff with ED RN Shawn.    Bernardino Mayotte  Trauma Response RN  Please call TRN at [phone] for further assistance.

## 2024-06-19 NOTE — ED Notes (Signed)
 Charge nurse notified pt meets trauma leveling criteria. Pt to trauma B via wheelchair by RN.

## 2024-06-19 NOTE — Progress Notes (Signed)
 Orthopedic Tech Progress Note Patient Details:  [name] Wilkie Sr. 05-02-1941 [id number]  Patient ID: [name], male   DOB: Mar 25, 1941, 83 y.o.   MRN: [id number] I attended trauma page. Chandra Dorn PARAS 06/19/2024, 6:02 AM

## 2024-06-19 NOTE — ED Provider Notes (Signed)
 [place] [facility] DEPARTMENT AT [facility] Provider Note   CSN: [id number] Arrival date & time: 06/19/24  9663     Patient presents with: [name] Kayla Stanton Chauncey Sr. is a 83 y.o. male.   Patient presents to the emergency department for evaluation after a fall.  Patient was trying to get to the bathroom when he lost his balance and fell, hit a wall and then may have hit another piece of furniture.  Patient with skin tears of his elbows.  He is complaining of left-sided chest pain and right knee pain.  Patient reports that he did not have chest pain before the fall, hit his chest when he fell.       Prior to Admission medications   Medication Sig Start Date End Date Taking? Authorizing Provider  oxyCODONE -acetaminophen  (PERCOCET) 5-325 MG tablet Take 1 tablet by mouth every 6 (six) hours as needed for severe pain (pain score 7-10). 06/19/24  Yes Delinda Malan, Lonni PARAS, MD  acetaminophen  (TYLENOL ) 650 MG CR tablet Take 650 mg by mouth every 8 (eight) hours as needed.    [provider]  allopurinol  (ZYLOPRIM ) 100 MG tablet TAKE ONE TABLET (100 MG TOTAL) BY MOUTH DAILY. 03/01/24   Simmons-Robinson, Rockie, MD  apixaban  (ELIQUIS ) 5 MG TABS tablet Take 1 tablet (5 mg total) by mouth 2 (two) times daily. 04/14/24   Lorene Lesley CROME, [name]  atorvastatin  (LIPITOR) 20 MG tablet Take 1 tablet (20 mg total) by mouth daily. 02/25/24   Simmons-Robinson, Rockie, MD  BD SHAR[employer] CONTAINER HOME MISC Dispose of syringes after B12 injections 10/19/17   Vivienne Delon HERO, [name]  Blood Glucose Monitoring Suppl (ACCU-CHEK AVIVA PLUS) w/Device KIT To check blood sugar once daily 06/11/17   Burnette, Jennifer M, [name]  cetirizine (ZYRTEC) 10 MG tablet Take 10 mg by mouth daily. Take 1 tablet daily as needed for allergies    [provider]  empagliflozin  (JARDIANCE ) 25 MG TABS tablet TAKE ONE TABLET (25 MG TOTAL) BY MOUTH DAILY. 02/25/24   Simmons-Robinson, Rockie, MD   ferrous sulfate  325 (65 FE) MG tablet Take 325 mg by mouth daily with breakfast.    [provider]  furosemide  (LASIX ) 20 MG tablet Take 1 tablet (20 mg total) by mouth every other day. 05/31/24   Simmons-Robinson, Makiera, MD  gabapentin  (NEURONTIN ) 300 MG capsule TAKE ONE CAPSULE BY MOUTH TWICE DAILY 04/25/24   Simmons-Robinson, Rockie, MD  glipiZIDE  (GLUCOTROL  XL) 10 MG 24 hr tablet TAKE ONE TABLET BY MOUTH EVERY MORNING WITH BREAKFAST 01/18/24   Simmons-Robinson, Makiera, MD  glucose blood (ACCU-CHEK GUIDE TEST) test strip USE AS DIRECTED TO CHECK FASTING BLOOD SUGAR EVERY MORNING 07/14/23   Simmons-Robinson, Rockie, MD  metoprolol  succinate (TOPROL -XL) 25 MG 24 hr tablet Take 1 tablet (25 mg total) by mouth daily. 05/31/24 11/27/24  Simmons-Robinson, Rockie, MD  nitroGLYCERIN  (NITROSTAT ) 0.4 MG SL tablet Place 1 tablet (0.4 mg total) under the tongue every 5 (five) minutes as needed for chest pain. 05/19/24   Lorene Lesley CROME, [name]  ranolazine  (RANEXA ) 1000 MG SR tablet TAKE ONE TABLET BY MOUTH TWO TIMES DAILY 09/28/23   Gollan, Timothy J, MD  RYBELSUS  14 MG TABS TAKE 1 TABLET BY MOUTH EVERY DAY 05/16/24   Simmons-Robinson, Makiera, MD  tamsulosin  (FLOMAX ) 0.4 MG CAPS capsule TAKE ONE CAPSULE (0.4 MG TOTAL) BY MOUTH DAILY. 04/04/24   Simmons-Robinson, Rockie, MD    Allergies: Ramipril    Review of Systems  Updated Vital Signs BP ROLLEN)  159/65   Pulse (!) 53   Temp (!) 97.2 F (36.2 C) (Oral)   Resp 13   Ht 5' 7 (1.702 m)   Wt 74 kg   SpO2 94%   BMI 25.55 kg/m   Physical Exam Vitals and nursing note reviewed.  Constitutional:      General: He is not in acute distress.    Appearance: He is well-developed.  HENT:     Head: Normocephalic and atraumatic.     Mouth/Throat:     Mouth: Mucous membranes are moist.  Eyes:     General: Vision grossly intact. Gaze aligned appropriately.     Extraocular Movements: Extraocular movements intact.     Conjunctiva/sclera: Conjunctivae  normal.  Cardiovascular:     Rate and Rhythm: Normal rate and regular rhythm.     Pulses: Normal pulses.     Heart sounds: Normal heart sounds, S1 normal and S2 normal. No murmur heard.    No friction rub. No gallop.  Pulmonary:     Effort: Pulmonary effort is normal. No respiratory distress.     Breath sounds: Normal breath sounds.  Chest:     Chest wall: Tenderness present. No deformity, swelling or crepitus.    Abdominal:     Palpations: Abdomen is soft.     Tenderness: There is no abdominal tenderness. There is no guarding or rebound.     Hernia: No hernia is present.  Musculoskeletal:        General: No swelling.     Cervical back: Full passive range of motion without pain, normal range of motion and neck supple. No pain with movement, spinous process tenderness or muscular tenderness. Normal range of motion.     Right knee: No swelling or deformity. Normal range of motion. Tenderness present.     Right lower leg: No edema.     Left lower leg: No edema.  Skin:    General: Skin is warm and dry.     Capillary Refill: Capillary refill takes less than 2 seconds.     Findings: Wound (Superficial skin tears on both elbows) present. No ecchymosis, erythema or lesion.  Neurological:     Mental Status: He is alert and oriented to person, place, and time.     GCS: GCS eye subscore is 4. GCS verbal subscore is 5. GCS motor subscore is 6.     Cranial Nerves: Cranial nerves 2-12 are intact.     Sensory: Sensation is intact.     Motor: Motor function is intact. No weakness or abnormal muscle tone.     Coordination: Coordination is intact.  Psychiatric:        Mood and Affect: Mood normal.        Speech: Speech normal.        Behavior: Behavior normal.     (all labs ordered are listed, but only abnormal results are displayed) Labs Reviewed  BASIC METABOLIC PANEL WITH GFR - Abnormal; Notable for the following components:      Result Value   Glucose, Bld 164 (*)    Creatinine, Ser  1.70 (*)    GFR, Estimated 40 (*)    All other components within normal limits  CBC WITH DIFFERENTIAL/PLATELET  TROPONIN I (HIGH SENSITIVITY)  TROPONIN I (HIGH SENSITIVITY)    EKG: None  Radiology: CT Cervical Spine Wo Contrast Result Date: 06/19/2024 EXAM: CT CERVICAL SPINE WITHOUT CONTRAST 06/19/2024 04:32:49 AM TECHNIQUE: CT of the cervical spine was performed without the administration of intravenous contrast.  Multiplanar reformatted images are provided for review. Automated exposure control, iterative reconstruction, and/or weight based adjustment of the mA/kV was utilized to reduce the radiation dose to as low as reasonably achievable. COMPARISON: Cervical spine CT 12/15/2019.Chest CT reported separately today. CLINICAL HISTORY: 83 year old male. Fall. FINDINGS: CERVICAL SPINE: BONES AND ALIGNMENT: No acute fracture or traumatic malalignment. Carious posterior left mandible dentition. DEGENERATIVE CHANGES: Chronic severe C1-C2 degeneration anteriorly and about the odontoid. Bulky chronic and partially calcified ligamentous hypertrophy about the odontoid has progressed, and multiple associated degenerative odontoid subchondral cysts are new. Advanced upper cervical facet arthropathy on the left, multilevel vacuum facet. Chronic severe C5-C6 disc and endplate degeneration. Underlying degenerative cervical spinal stenosis does not appear significantly progressed from the previous CT. SOFT TISSUES: No prevertebral soft tissue swelling. Severe calcified carotid atherosclerosis in the neck, especially on the left side. Otherwise negative non-contrast neck soft tissues. Chest CT reported separately today. Negative lung apices. IMPRESSION: 1. No acute traumatic injury identified in the cervical spine. 2. Advanced chronic cervical spine degeneration, progressed since 2021. Electronically signed by: Helayne Hurst MD 06/19/2024 04:52 AM EST RP Workstation: [id number]   CT HEAD WO CONTRAST Result Date:  06/19/2024 EXAM: CT HEAD WITHOUT CONTRAST 06/19/2024 04:32:49 AM TECHNIQUE: CT of the head was performed without the administration of intravenous contrast. Automated exposure control, iterative reconstruction, and/or weight based adjustment of the mA/kV was utilized to reduce the radiation dose to as low as reasonably achievable. COMPARISON: Head CT 07/30/2022. CLINICAL HISTORY: 83 year old male with moderate-severe head trauma. FINDINGS: BRAIN AND VENTRICLES: No acute hemorrhage. No evidence of acute infarct. No hydrocephalus. No extra-axial collection. No mass effect or midline shift. Normal brain volume for age. Faintly asymmetric chronic left basal ganglia vascular calcifications. Stable dural calcification. No suspicious intracranial vascular hyperdensity. Normal gray white differentiation for age. ORBITS: No acute orbit injury. SINUSES: Paranasal sinuses, middle ears and mastoids are clear. SOFT TISSUES AND SKULL: No acute scalp soft tissue injury. No skull fracture. Advanced calcified atherosclerosis at the skull base. Carious left mandible dentition partially visible. Advanced chronic degeneration in the visible upper cervical spine. IMPRESSION: 1. No acute traumatic injury identified. 2. Normal for age non-contrast CT appearance of the brain. Electronically signed by: Helayne Hurst MD 06/19/2024 04:48 AM EST RP Workstation: [id number]   CT CHEST ABDOMEN PELVIS WO CONTRAST Result Date: 06/19/2024 EXAM: CT CHEST, ABDOMEN AND PELVIS WITHOUT CONTRAST 06/19/2024 04:33:35 AM TECHNIQUE: CT of the chest, abdomen and pelvis was performed without the administration of intravenous contrast. Multiplanar reformatted images are provided for review. Automated exposure control, iterative reconstruction, and/or weight based adjustment of the mA/kV was utilized to reduce the radiation dose to as low as reasonably achievable. COMPARISON: None available. CLINICAL HISTORY: Polytrauma, blunt. FINDINGS: CHEST: MEDIASTINUM AND  LYMPH NODES: Heart and pericardium are unremarkable. Status post CABG. The central airways are clear. No mediastinal, hilar or axillary lymphadenopathy. LUNGS AND PLEURA: Scar bandlike densities within the right middle lobe, lateral right lower lobe and lingula representing either platelike atelectasis or scar. No focal consolidation or pulmonary edema. No pleural effusion or pneumothorax. ABDOMEN AND PELVIS: LIVER: Calcified granulomas identified within the liver. GALLBLADDER AND BILE DUCTS: Gallbladder is unremarkable. No biliary ductal dilatation. SPLEEN: The spleen is within normal limits in size and appearance. PANCREAS: The pancreas is normal in size and contour without focal lesion or ductal dilatation. ADRENAL GLANDS: Normal size and morphology bilaterally. No nodule, thickening, or hemorrhage. No periadrenal stranding. KIDNEYS, URETERS AND BLADDER: No stones in the kidneys  or ureters. No hydronephrosis. No perinephric or periureteral stranding. Urinary bladder is unremarkable. GI AND BOWEL: Stomach demonstrates no acute abnormality. The appendix is visualized and normal in caliber, without wall thickening, periappendiceal inflammation, or fluid. There is no bowel obstruction. REPRODUCTIVE ORGANS: Marked prostate gland enlargement. PERITONEUM AND RETROPERITONEUM: No ascites. No free air. VASCULATURE: Aorta is normal in caliber. Aortic atherosclerosis. Aortic atherosclerotic calcifications. ABDOMINAL AND PELVIS LYMPH NODES: No lymphadenopathy. BONES AND SOFT TISSUES: There are acute, nondisplaced fractures involving the anterior aspect of the left 5th, 6th, and 7th ribs. Multilevel endplate degenerative changes identified within the lumbar spine. First-degree anterolisthesis of L4 on L5 noted. No focal soft tissue abnormality. IMPRESSION: 1. Acute, nondisplaced fractures of the anterior left 5th7th ribs. 2. Aortic atherosclerotic calcifications. 3. Marked prostate gland enlargement. Electronically signed by:  Waddell Calk MD 06/19/2024 04:45 AM EST RP Workstation: [id number]   DG Chest Port 1 View Result Date: 06/19/2024 EXAM: 1 VIEW(S) XRAY OF THE CHEST 06/19/2024 04:05:00 AM COMPARISON: Portable chest x ray 07/30/2022 and earlier. CLINICAL HISTORY: 83 year old male. Trauma. FINDINGS: LUNGS AND PLEURA: Mildly lower lung volumes. When allowing for portable technique, both lungs are clear. No focal pulmonary opacity. No pleural effusion. No pneumothorax. HEART AND MEDIASTINUM: Postsurgical changes from CABG noted. Cardiomegaly. Calcified aorta. BONES AND SOFT TISSUES: Median sternotomy wires noted. No acute osseous abnormality. IMPRESSION: 1. No acute cardiopulmonary abnormality. Electronically signed by: Helayne Hurst MD 06/19/2024 04:17 AM EST RP Workstation: [id number]   DG Knee Right Port Result Date: 06/19/2024 EXAM: 1 or 2 VIEW(S) XRAY OF THE KNEE 06/19/2024 04:05:00 AM COMPARISON: None available. CLINICAL HISTORY: 83 year old male with blunt trauma. FINDINGS: BONES AND JOINTS: No acute fracture. No focal osseous lesion. No joint dislocation. Small joint effusion. Mild tricompartmental degenerative changes. Chondrocalcinosis. SOFT TISSUES: Advanced calcified peripheral vascular disease. IMPRESSION: 1. Small joint effusion. No acute fracture or dislocation identified about the left knee. 2. Advanced calcified peripheral vascular disease. Electronically signed by: Helayne Hurst MD 06/19/2024 04:16 AM EST RP Workstation: [id number]     Procedures   Medications Ordered in the ED  fentaNYL  (SUBLIMAZE ) injection 12.5 mcg (12.5 mcg Intravenous Given 06/19/24 0427)  oxyCODONE -acetaminophen  (PERCOCET/ROXICET) 5-325 MG per tablet 1 tablet (1 tablet Oral Given 06/19/24 0607)                                    Medical Decision Making Amount and/or Complexity of Data Reviewed Labs: ordered. Radiology: ordered.  Risk Prescription drug management.   Differential diagnosis considered includes, but not  limited to: Blunt trauma including intracranial injury, spinal injury, thoracic injury, intra-abdominal and retroperitoneal injury, orthopedic injury  Presents to emergency department for evaluation after a fall.  He is complaining of chest pain on arrival but reports that the pain only started after the fall.  Patient has significantly tender left sided chest wall examination.  No deformity, lung fields are clear.  Patient chronically anticoagulated.  Normal neurologic exam.  CT head and cervical spine without acute injury.  Chest x-ray did not show obvious injury, patient went for CT chest abdomen and pelvis to further evaluate his thoracic pain and tenderness after fall.  Patient with 3 rib fractures that are nondisplaced.  No pulmonary contusion or pneumothorax.  Discussed admission with the patient.  He does not want to be admitted.  Patient reports that his pain is much improved after the small dose of fentanyl .  He thinks  he will be able to go home with pain meds.  He was given a Percocet here and ambulated.  Discharged with prescription, given return precautions.     Final diagnoses:  Closed fracture of multiple ribs of left side, initial encounter    ED Discharge Orders          Ordered    oxyCODONE -acetaminophen  (PERCOCET) 5-325 MG tablet  Every 6 hours PRN        06/19/24 0656               Haze Lonni PARAS, MD 06/19/24 [phone]

## 2024-06-27 ENCOUNTER — Encounter: Payer: Self-pay | Admitting: Family Medicine

## 2024-06-27 ENCOUNTER — Ambulatory Visit: Admitting: Family Medicine

## 2024-06-27 VITALS — BP 129/58 | HR 67 | Ht 67.0 in | Wt 161.2 lb

## 2024-06-27 DIAGNOSIS — S2242XD Multiple fractures of ribs, left side, subsequent encounter for fracture with routine healing: Secondary | ICD-10-CM

## 2024-06-27 MED ORDER — OXYCODONE-ACETAMINOPHEN 5-325 MG PO TABS: 1.0000 | ORAL_TABLET | Freq: Four times a day (QID) | ORAL | 0 refills | Status: AC | PRN

## 2024-06-27 NOTE — Patient Instructions (Signed)
 To keep you healthy, please keep in mind the following health maintenance items that you are due for:   Health Maintenance Due  Topic Date Due   OPHTHALMOLOGY EXAM  Never done   DTaP/Tdap/Td (1 - Tdap) Never done   Zoster Vaccines- Shingrix (1 of 2) Never done   Medicare Annual Wellness (AWV)  12/01/2023     Best Wishes,   Dr. Lang

## 2024-06-27 NOTE — Progress Notes (Signed)
 Established patient visit   Patient: [name] Demarais Sr.   DOB: [birth year]   83 y.o. Male  MRN: [id number] Visit Date: 06/27/2024  Today's healthcare provider: Rockie Agent, MD   Chief Complaint  Patient presents with   Follow-up    Patient is present for ED f/u from 06/19/2024. Dx fall, broken ribs on left side  Patient is not feeling very well, in pain today. Has been taking oxycodone  for this and is almost out. 8/10 pain today    Subjective     HPI     Follow-up    Additional comments: Patient is present for ED f/u from 06/19/2024. Dx fall, broken ribs on left side  Patient is not feeling very well, in pain today. Has been taking oxycodone  for this and is almost out. 8/10 pain today       Last edited by Cherry Chiquita HERO, CMA on 06/27/2024 10:19 AM.       Discussed the use of AI scribe software for clinical note transcription with the patient, who gave verbal consent to proceed.  History of Present Illness [name] Dalton Sr. is an 83 year old male who presents for follow-up after a fall resulting in rib fractures.  He experienced a fall on November 23rd, resulting in acute nondisplaced fractures of the anterior fifth and seventh ribs on the left side. He has significant pain, rated as 8 out of 10, which worsens when lying on his left side, affecting his sleep. He has been taking Percocet, primarily at night, which alleviates the pain but causes him to feel 'loopy'. He is almost out of his medication.  He was seen in the emergency department on the day of the fall, where a CT of the cervical spine showed no traumatic injury but advanced chronic cervical spine degeneration. A CT of the head was normal for his age, and a CT of the abdomen and pelvis confirmed the rib fractures. A chest X-ray showed no acute chest abnormalities, and a small joint effusion was noted in the right knee without acute fracture or dislocation.  He has a history of  chronic kidney disease, with a creatinine level of 1.7 and a GFR of 40 as of November 23rd. He continues to follow up with nephrology for this condition.  No swelling, coughing up blood, or fevers. He reports no headaches, double vision, or other neurological symptoms following the fall. He has a well-healed abrasion on his left scalp from the incident.  He is not currently working and is being encouraged by his family to rest. He is able to eat and swallow without difficulty and is trying to stay active by walking.     Past Medical History:  Diagnosis Date   Arthritis    Back pain    hx   CAD (coronary artery disease)    a. 2004 s/p CABG x 4 (LIMA->LAD, VG->Diag, VG->OM, VG->RCA); b. 2007 Cath: VG->Diag occluded, otw 3/4 patent grafts.   CKD (chronic kidney disease), stage II-III    Colon polyps    DM2 (diabetes mellitus, type 2) (HCC)    stable   GERD (gastroesophageal reflux disease)    Glaucoma    Gout    HLD (hyperlipidemia)    HTN (hypertension)    Neuropathy     Medications: Outpatient Medications Prior to Visit  Medication Sig   acetaminophen  (TYLENOL ) 650 MG CR tablet Take 650 mg by mouth every 8 (eight) hours as needed.   allopurinol  (  ZYLOPRIM ) 100 MG tablet TAKE ONE TABLET (100 MG TOTAL) BY MOUTH DAILY.   apixaban  (ELIQUIS ) 5 MG TABS tablet Take 1 tablet (5 mg total) by mouth 2 (two) times daily.   atorvastatin  (LIPITOR) 20 MG tablet Take 1 tablet (20 mg total) by mouth daily.   BD SHARPS CONTAINER HOME MISC Dispose of syringes after B12 injections   Blood Glucose Monitoring Suppl (ACCU-CHEK AVIVA PLUS) w/Device KIT To check blood sugar once daily   cetirizine (ZYRTEC) 10 MG tablet Take 10 mg by mouth daily. Take 1 tablet daily as needed for allergies   empagliflozin  (JARDIANCE ) 25 MG TABS tablet TAKE ONE TABLET (25 MG TOTAL) BY MOUTH DAILY.   ferrous sulfate  325 (65 FE) MG tablet Take 325 mg by mouth daily with breakfast.   furosemide  (LASIX ) 20 MG tablet Take 1  tablet (20 mg total) by mouth every other day.   gabapentin  (NEURONTIN ) 300 MG capsule TAKE ONE CAPSULE BY MOUTH TWICE DAILY   glipiZIDE  (GLUCOTROL  XL) 10 MG 24 hr tablet TAKE ONE TABLET BY MOUTH EVERY MORNING WITH BREAKFAST   glucose blood (ACCU-CHEK GUIDE TEST) test strip USE AS DIRECTED TO CHECK FASTING BLOOD SUGAR EVERY MORNING   metoprolol  succinate (TOPROL -XL) 25 MG 24 hr tablet Take 1 tablet (25 mg total) by mouth daily.   nitroGLYCERIN  (NITROSTAT ) 0.4 MG SL tablet Place 1 tablet (0.4 mg total) under the tongue every 5 (five) minutes as needed for chest pain.   ranolazine  (RANEXA ) 1000 MG SR tablet TAKE ONE TABLET BY MOUTH TWO TIMES DAILY   RYBELSUS  14 MG TABS TAKE 1 TABLET BY MOUTH EVERY DAY   tamsulosin  (FLOMAX ) 0.4 MG CAPS capsule TAKE ONE CAPSULE (0.4 MG TOTAL) BY MOUTH DAILY.   [DISCONTINUED] oxyCODONE -acetaminophen  (PERCOCET) 5-325 MG tablet Take 1 tablet by mouth every 6 (six) hours as needed for severe pain (pain score 7-10).   Facility-Administered Medications Prior to Visit  Medication Dose Route Frequency Provider   sodium chloride  flush (NS) 0.9 % injection 3 mL  3 mL Intravenous Q12H Furth, Cadence H, [name]    Review of Systems  Last CBC Lab Results  Component Value Date   WBC 7.1 06/19/2024   HGB 14.2 06/19/2024   HCT 43.5 06/19/2024   MCV 96.7 06/19/2024   MCH 31.6 06/19/2024   RDW 12.5 06/19/2024   PLT 191 06/19/2024   Last metabolic panel Lab Results  Component Value Date   GLUCOSE 164 (H) 06/19/2024   NA 141 06/19/2024   K 4.1 06/19/2024   CL 108 06/19/2024   CO2 28 06/19/2024   BUN 19 06/19/2024   CREATININE 1.70 (H) 06/19/2024   GFRNONAA 40 (L) 06/19/2024   CALCIUM  9.0 06/19/2024   PROT 5.9 (L) 06/16/2022   ALBUMIN 3.5 06/16/2022   LABGLOB 1.8 05/16/2021   AGRATIO 2.7 (H) 05/16/2021   BILITOT 0.6 06/16/2022   ALKPHOS 58 06/16/2022   AST 27 06/16/2022   ALT 52 (H) 06/16/2022   ANIONGAP 5 06/19/2024   Last hemoglobin A1c Lab Results   Component Value Date   HGBA1C 7.0 (H) 05/31/2024    CT Cervical Spine Wo Contrast Result Date: 06/19/2024 EXAM: CT CERVICAL SPINE WITHOUT CONTRAST 06/19/2024 04:32:49  IMPRESSION: 1. No acute traumatic injury identified in the cervical spine. 2. Advanced chronic cervical spine degeneration, progressed since 2021.   CT HEAD WO CONTRAST Result Date: 06/19/2024 EXAM: CT HEAD WITHOUT CONTRAST 06/19/2024 04:32:49 AM   IMPRESSION: 1. No acute traumatic injury identified. 2. Normal for age non-contrast  CT appearance of the brain.   CT CHEST ABDOMEN PELVIS WO CONTRAST Result Date: 06/19/2024 EXAM: CT CHEST, ABDOMEN AND PELVIS WITHOUT CONTRAST 06/19/2024 04:33:35 AM  IMPRESSION: 1. Acute, nondisplaced fractures of the anterior left 5th,7th ribs. 2. Aortic atherosclerotic calcifications. 3. Marked prostate gland enlargement. Electronically signed by: Waddell Calk MD 06/19/2024 04:45 AM EST RP Workstation: [id number]   DG Chest Port 1 View Result Date: 06/19/2024 EXAM: 1 VIEW(S) XRAY OF THE CHEST 06/19/2024 04:05:00 AM COMPARISON: Portable chest x ray 07/30/2022 and earlier. CLINICAL HISTORY: 83 year old male. Trauma. FINDINGS: LUNGS AND PLEURA: Mildly lower lung volumes. When allowing for portable technique, both lungs are clear. No focal pulmonary opacity. No pleural effusion. No pneumothorax. HEART AND MEDIASTINUM: Postsurgical changes from CABG noted. Cardiomegaly. Calcified aorta. BONES AND SOFT TISSUES: Median sternotomy wires noted. No acute osseous abnormality. IMPRESSION: 1. No acute cardiopulmonary abnormality. Electronically signed by: Helayne Hurst MD 06/19/2024 04:17 AM EST RP Workstation: [id number]   DG Knee Right Port Result Date: 06/19/2024 EXAM: 1 or 2 VIEW(S) XRAY OF THE KNEE 06/19/2024 04:05:00 AM COMPARISON: None available. CLINICAL HISTORY: 83 year old male with blunt trauma. FINDINGS: BONES AND JOINTS: No acute fracture. No focal osseous lesion. No joint dislocation. Small joint  effusion. Mild tricompartmental degenerative changes. Chondrocalcinosis. SOFT TISSUES: Advanced calcified peripheral vascular disease. IMPRESSION: 1. Small joint effusion. No acute fracture or dislocation identified about the left knee. 2. Advanced calcified peripheral vascular disease.         Objective    BP (!) 129/58 (BP Location: Right Arm, Patient Position: Sitting, Cuff Size: Normal)   Pulse 67   Ht 5' 7 (1.702 m)   Wt 161 lb 3.2 oz (73.1 kg)   SpO2 99%   BMI 25.25 kg/m      Physical Exam  Physical Exam VITALS: BP- 129/58, SaO2- 99% HEENT: Well-healed abrasion on left scalp, normocephalic, normal oropharynx, moist mucous membranes. CHEST: Clear to auscultation bilaterally, no wheezes, rhonchi, or crackles. Chest wall normal with tenderness in left inferior pectoral region, no bruising of thorax.    No results found for any visits on 06/27/24.  Assessment & Plan     Problem List Items Addressed This Visit   None Visit Diagnoses       Closed fracture of multiple ribs of left side with routine healing, subsequent encounter    -  Primary   Relevant Medications   oxyCODONE -acetaminophen  (PERCOCET) 5-325 MG tablet       Assessment and Plan Assessment & Plan Multiple left rib fractures with acute pain Acute pain due to multiple left rib fractures sustained from a fall on November 23rd, 2025. Pain is rated 8/10, primarily at night, exacerbated by lying on the left side. CT of the chest showed acute nondisplaced fractures of the anterior fifth and seventh ribs. No acute chest abnormalities on chest x-ray. Oxygen saturation is 99%, and he is breathing adequately. No signs of pulmonary complications such as pneumonia or pleural effusion. Pain management with Percocet has been effective but causes a feeling of being 'loopy'. Healing is expected in approximately six weeks. - Continue Percocet as needed, up to four times a day, for pain management. - Prescribed additional  Percocet 5/325 mg to be filled at in [place]. - Advised against wrapping ribs to avoid constriction of breathing. - Encouraged rest and avoidance of heavy lifting, pulling, and pushing. - Instructed to monitor for signs of bruising or bleeding under the skin. - Advised to maintain adequate pain control to prevent respiratory  complications such as pneumonia. - Scheduled follow-up appointment in early March, with instructions to call if pain persists after current prescription runs out.     Return if symptoms worsen or fail to improve.         Rockie Agent, MD  [facility] [phone] (phone) [phone] (fax)  [facility]

## 2024-07-19 ENCOUNTER — Ambulatory Visit: Attending: Physician Assistant

## 2024-07-19 DIAGNOSIS — I4891 Unspecified atrial fibrillation: Secondary | ICD-10-CM | POA: Diagnosis not present

## 2024-07-19 LAB — ECHOCARDIOGRAM COMPLETE
AR max vel: 1.51 cm2
AV Area VTI: 1.56 cm2
AV Area mean vel: 1.57 cm2
AV Mean grad: 6 mmHg
AV Peak grad: 11.6 mmHg
Ao pk vel: 1.7 m/s
Area-P 1/2: 3.53 cm2
S' Lateral: 4.71 cm

## 2024-07-22 NOTE — Telephone Encounter (Signed)
 Called patient to relay results of ECHO. Left voicemail with call back number.

## 2024-07-22 NOTE — Telephone Encounter (Signed)
-----   Message from Lesley LITTIE Maffucci sent at 07/22/2024  1:30 PM EST ----- Echo showed heart pump squeeze was mildly reduced with some mild valvular abnormalities. Overall unchanged since prior study in 2023.

## 2024-07-30 ENCOUNTER — Other Ambulatory Visit: Payer: Self-pay | Admitting: Family Medicine

## 2024-07-30 DIAGNOSIS — E118 Type 2 diabetes mellitus with unspecified complications: Secondary | ICD-10-CM

## 2024-08-08 NOTE — Progress Notes (Unsigned)
 "     Electrophysiology Clinic Note    Date:  08/09/2024  Patient ID:  [name] Nikolai Sr., DOB [birth year], MRN [id number] PCP:  [name], MD  Cardiologist:  [name], MD  Cardiology APP:  Lorene Lesley CROME, [name]  Electrophysiologist:  Fonda Kitty, MD  Electrophysiology APP:  Latash Nouri, NP    Discussed the use of AI scribe software for clinical note transcription with the patient, who gave verbal consent to proceed.   Patient Profile    Chief Complaint: AFib follow-up  History of Present Illness: [name] Stanton Bassett Sr. is a 84 y.o. male with PMH notable for persis AFib, PVCs, diastolic HF, CAD s/p CABG, HTN, T2DM, CKD- 3; seen today for Fonda Kitty, MD (Previously Dr. Cindie) for routine electrophysiology followup.   I last saw him 04/2023 for PVC management.  He was overall doing well with improved palpitation burden.  EKG at that visit without PVCs.   He recently saw PA Lorene 03/2024 where he was in new asymptomatic atrial fibrillation and was started on Eliquis .  Plan for updated echo and 2-week monitor to assess for paroxysmal episodes.  Updated monitor showed 100% A-fib burden, underwent cardioversion 05/25/2024. Echo with stable mid-range LVEF.   On follow-up today, he is not aware of any A-fib episodes since cardioversion.  He continues to take Eliquis  twice a day without significant bleeding.  He has had two nose bleeds since starting eliquis  after not having nose bleeds for many years.  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  No activity limitations  He has not taken medicines yet this AM, plans to after visit.     Arrhythmia/Device History No specialty comments available.    ROS:  Please see the history of present illness. All other systems are reviewed and otherwise negative.    Physical Exam    VS:  BP (!) 162/80 (BP Location: Left Arm, Patient Position: Sitting, Cuff Size: Normal) Comment: HAS NOT  TAKEN MEDS THIS AM  Pulse (!) 53   Ht 5' 6 (1.676 m)   Wt 164 lb 9.6 oz (74.7 kg)   SpO2 96%   BMI 26.57 kg/m  BMI: Body mass index is 26.57 kg/m.           Wt Readings from Last 3 Encounters:  08/09/24 164 lb 9.6 oz (74.7 kg)  06/27/24 161 lb 3.2 oz (73.1 kg)  06/19/24 163 lb 2.3 oz (74 kg)     GEN- The patient is well appearing, alert and oriented x 3 today.   Lungs- Clear to ausculation bilaterally, normal work of breathing.  Heart- Regular rate with ectopy, no murmurs, rubs or gallops Extremities- No peripheral edema, warm, dry   Studies Reviewed   Previous EP, cardiology notes.    EKG is ordered. Personal review of EKG from today shows:    EKG Interpretation Date/Time:  Tuesday August 09 2024 09:54:40 EST Ventricular Rate:  56 PR Interval:  190 QRS Duration:  148 QT Interval:  514 QTC Calculation: 496 R Axis:   82  Text Interpretation: Sinus bradycardia with occasional Premature ventricular complexes Right bundle branch block Confirmed by Borden Thune [phone]) on 08/09/2024 10:03:45 AM    TTE, 07/19/2024 1. Left ventricular ejection fraction, by estimation, is 45 to 50%. The left ventricle has mildly decreased function. Left ventricular endocardial border not optimally defined to evaluate regional wall motion. Left ventricular diastolic parameters are consistent with Grade I diastolic dysfunction (impaired relaxation).  2. Right ventricular systolic function is low normal. The right ventricular size is mildly enlarged.   3. Left atrial size was mildly dilated.   4. Right atrial size was mildly dilated.   5. The mitral valve is normal in structure. Mild mitral valve regurgitation.   6. Tricuspid valve regurgitation is mild to moderate.   7. The aortic valve is tricuspid. There is moderate calcification of the aortic valve. There is mild thickening of the aortic valve. Aortic valve regurgitation is not visualized. Aortic valve sclerosis/calcification is present,  without any evidence of aortic stenosis.   Comparison(s): A prior study was performed on 06/18/2022. No significant change from prior study.   Long term monitor, 05/05/2024 Patch Wear Time:  10 days and 9 hours (2025-09-21T17:20:16-0400 to 2025-10-02T02:52:04-399)  Atrial Fibrillation occurred continuously (100% burden), ranging from 41-119 bpm (avg of 63 bpm).  Isolated VEs were frequent (5.1%, [id number]), VE Couplets were rare (<1.0%, 186), and VE Triplets were rare (<1.0%, 1).  Ventricular Bigeminy and Trigeminy were present. No patient triggered events recorded  Long term monitor, 09/09/2022 Normal sinus rhythm Patient had a min HR of 47 bpm, max HR of 150 bpm, and avg HR of 72 bpm.   1 run of Ventricular Tachycardia occurred lasting 4 beats with a max rate of 150 bpm (avg 142 bpm).   164 Supraventricular Tachycardia runs occurred, the run with the fastest interval lasting 4 beats with a max rate of 138 bpm, the longest lasting 5 mins 44 secs with an avg rate of 106 bpm.   Isolated SVEs were occasional (2.5%, 63387), SVE Couplets were rare (<1.0%, 4695), and SVE Triplets were rare (<1.0%, 1259).   Isolated VEs were frequent (11.1%, [id number]), VE Couplets were rare (<1.0%, 3136), and VE Triplets were rare (<1.0%, 2). Ventricular Bigeminy and Trigeminy were present.  Patient triggered events (4) with normal sinus rhythm, PACs, PVCs   TTE, 06/18/2022  1. Left ventricular ejection fraction, by estimation, is 45 to 50%. The left ventricle has mildly decreased function. The left ventricle demonstrates global hypokinesis. The left ventricular internal cavity size was mildly dilated. Left ventricular diastolic parameters are consistent with Grade I diastolic dysfunction (impaired relaxation).   2. Right ventricular systolic function is mildly reduced. The right ventricular size is normal. There is normal pulmonary artery systolic pressure.   3. Left atrial size was mildly dilated.   4. The mitral valve  is grossly normal. No evidence of mitral valve regurgitation. No evidence of mitral stenosis.   5. The aortic valve is tricuspid. There is moderate calcification of the aortic valve. Aortic valve regurgitation is not visualized. Aortic valve sclerosis/calcification is present, without any evidence of aortic stenosis.   6. The inferior vena cava is dilated in size with >50% respiratory variability, suggesting right atrial pressure of 8 mmHg.  Comparison(s): Prior images reviewed side by side. LV is slightly dilated    TTE, 02/19/2022  1. Left ventricular ejection fraction, by estimation, is 50 to 55%. The left ventricle has low normal function. The left ventricle has no regional wall motion abnormalities. There is mild left ventricular hypertrophy. Left ventricular diastolic parameters are consistent with Grade III diastolic dysfunction (restrictive). Elevated left atrial pressure. The average left ventricular global longitudinal strain is -12.3 %. The global longitudinal strain is abnormal.   2. Right ventricular systolic function is mildly reduced. The right ventricular size is normal. There is moderately elevated pulmonary artery systolic pressure. The estimated right ventricular systolic pressure is 49.1 mmHg.  3. Left atrial size was moderately dilated.   4. Right atrial size was moderately dilated.   5. The mitral valve is normal in structure. Mild mitral valve regurgitation. No evidence of mitral stenosis.   6. Tricuspid valve regurgitation is mild to moderate.   7. The aortic valve is tricuspid. There is mild calcification of the aortic valve. There is mild thickening of the aortic valve. Aortic valve regurgitation is not visualized. Aortic valve sclerosis/calcification is present, without any evidence of aortic stenosis.   8. Aortic dilatation noted. There is borderline dilatation of the aortic root, measuring 39 mm. There is borderline dilatation of the aortic arch, measuring 31 mm.   9. The  inferior vena cava is dilated in size with <50% respiratory variability, suggesting right atrial pressure of 15 mmHg.   Comparison(s): EF 50%, hypokinesis of the basal septal, anteroseptal, inferior/inferoseptal regions, RVSP 44.55mmHg.    Assessment and Plan     #) persis AFib New diagnosis S/p DCCV 04/2024, maintaining sinus rhythm since Continue 25mg  toprol  daily Recommended he check BP regularly and notify office irregular pulse indicator alerts Recommend AAD vs AF ablation if has recurrence  #) Hypercoag d/t persis afib CHA2DS2-VASc Score = at least 6 [CHF History: 1, HTN History: 1, Diabetes History: 1, Stroke History: 0, Vascular Disease History: 1, Age Score: 2, Gender Score: 0].  Therefore, the patient's annual risk of stroke is 9.7 %.    Stroke ppx - 5mg  eliquis  Having epistaxis, recommended nasal moisturizer during cold months Continue to monitor for bleeding. 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   The patient does not have concerns regarding his medicines.  The following changes were made today:  none  Labs/ tests ordered today include:  Orders Placed This Encounter  Procedures   Basic metabolic panel with GFR   EKG [id number]     Disposition: Follow up with Dr. Kennyth or EP APP in 6 months, sooner if AF recurs  Follow-up with gen cardiology in about 3 months as scheduled   Signed, Elliette Seabolt, NP  08/09/2024  1:29 PM  Electrophysiology [facility] "

## 2024-08-09 ENCOUNTER — Ambulatory Visit: Attending: Cardiology | Admitting: Cardiology

## 2024-08-09 VITALS — BP 162/80 | HR 53 | Ht 66.0 in | Wt 164.6 lb

## 2024-08-09 DIAGNOSIS — D6869 Other thrombophilia: Secondary | ICD-10-CM | POA: Diagnosis not present

## 2024-08-09 DIAGNOSIS — I5020 Unspecified systolic (congestive) heart failure: Secondary | ICD-10-CM | POA: Diagnosis not present

## 2024-08-09 DIAGNOSIS — I10 Essential (primary) hypertension: Secondary | ICD-10-CM

## 2024-08-09 DIAGNOSIS — I4819 Other persistent atrial fibrillation: Secondary | ICD-10-CM | POA: Diagnosis not present

## 2024-08-09 DIAGNOSIS — I493 Ventricular premature depolarization: Secondary | ICD-10-CM

## 2024-08-09 NOTE — Patient Instructions (Signed)
 Medication Instructions:  Your physician recommends that you continue on your current medications as directed. Please refer to the Current Medication list given to you today.  *If you need a refill on your cardiac medications before your next appointment, please call your pharmacy*  Lab Work:  Your provider would like for you to have following labs drawn today BMP.    Testing/Procedures: No test ordered today   Follow-Up: At [facility], you and your health needs are our priority.  As part of our continuing mission to provide you with exceptional heart care, our providers are all part of one team.  This team includes your primary Cardiologist (physician) and Advanced Practice Providers or APPs (Physician Assistants and Nurse Practitioners) who all work together to provide you with the care you need, when you need it.  Your next appointment:   6 month(s)  Provider:   Suzann Riddle, NP

## 2024-08-10 ENCOUNTER — Ambulatory Visit: Payer: Self-pay | Admitting: Cardiology

## 2024-08-10 LAB — BASIC METABOLIC PANEL WITH GFR
BUN/Creatinine Ratio: 10 (ref 10–24)
BUN: 14 mg/dL (ref 8–27)
CO2: 22 mmol/L (ref 20–29)
Calcium: 8.9 mg/dL (ref 8.6–10.2)
Chloride: 106 mmol/L (ref 96–106)
Creatinine, Ser: 1.41 mg/dL — ABNORMAL HIGH (ref 0.76–1.27)
Glucose: 136 mg/dL — ABNORMAL HIGH (ref 70–99)
Potassium: 4.2 mmol/L (ref 3.5–5.2)
Sodium: 142 mmol/L (ref 134–144)
eGFR: 50 mL/min/1.73 — ABNORMAL LOW

## 2024-08-17 ENCOUNTER — Other Ambulatory Visit: Payer: Self-pay | Admitting: Family Medicine

## 2024-08-17 DIAGNOSIS — E118 Type 2 diabetes mellitus with unspecified complications: Secondary | ICD-10-CM

## 2024-09-13 ENCOUNTER — Ambulatory Visit

## 2024-09-14 ENCOUNTER — Ambulatory Visit

## 2024-09-28 ENCOUNTER — Ambulatory Visit: Admitting: Family Medicine

## 2024-10-17 ENCOUNTER — Ambulatory Visit: Admitting: Cardiovascular Disease
